# Patient Record
Sex: Female | Born: 1935
Health system: Southern US, Community
[De-identification: ages and names within clinical notes are randomized; demographics above are authoritative.]

## PROBLEM LIST (undated history)

## (undated) DIAGNOSIS — G2582 Stiff-man syndrome: Secondary | ICD-10-CM

## (undated) DIAGNOSIS — C50919 Malignant neoplasm of unspecified site of unspecified female breast: Secondary | ICD-10-CM

## (undated) DIAGNOSIS — R05 Cough: Secondary | ICD-10-CM

## (undated) DIAGNOSIS — I10 Essential (primary) hypertension: Secondary | ICD-10-CM

## (undated) DIAGNOSIS — I480 Paroxysmal atrial fibrillation: Secondary | ICD-10-CM

## (undated) DIAGNOSIS — E785 Hyperlipidemia, unspecified: Secondary | ICD-10-CM

## (undated) DIAGNOSIS — Z923 Personal history of irradiation: Secondary | ICD-10-CM

## (undated) DIAGNOSIS — T464X5A Adverse effect of angiotensin-converting-enzyme inhibitors, initial encounter: Secondary | ICD-10-CM

## (undated) DIAGNOSIS — I2510 Atherosclerotic heart disease of native coronary artery without angina pectoris: Principal | ICD-10-CM

## (undated) DIAGNOSIS — M8448XA Pathological fracture, other site, initial encounter for fracture: Secondary | ICD-10-CM

## (undated) DIAGNOSIS — R760 Raised antibody titer: Secondary | ICD-10-CM

## (undated) DIAGNOSIS — E079 Disorder of thyroid, unspecified: Secondary | ICD-10-CM

## (undated) DIAGNOSIS — I5032 Chronic diastolic (congestive) heart failure: Secondary | ICD-10-CM

## (undated) DIAGNOSIS — R058 Other specified cough: Secondary | ICD-10-CM

## (undated) HISTORY — DX: Malignant neoplasm of unspecified site of unspecified female breast: C50.919

## (undated) HISTORY — DX: Cough: R05

## (undated) HISTORY — PX: CARPAL TUNNEL RELEASE: SHX101

## (undated) HISTORY — PX: OTHER SURGICAL HISTORY: SHX169

## (undated) HISTORY — DX: Essential (primary) hypertension: I10

## (undated) HISTORY — DX: Hyperlipidemia, unspecified: E78.5

## (undated) HISTORY — PX: TOTAL ABDOMINAL HYSTERECTOMY: SHX209

## (undated) HISTORY — PX: BREAST BIOPSY: SHX20

## (undated) HISTORY — DX: Adverse effect of angiotensin-converting-enzyme inhibitors, initial encounter: T46.4X5A

## (undated) HISTORY — DX: Disorder of thyroid, unspecified: E07.9

## (undated) HISTORY — DX: Morbid (severe) obesity due to excess calories: E66.01

## (undated) HISTORY — PX: EYE SURGERY: SHX253

## (undated) HISTORY — DX: Other specified cough: R05.8

## (undated) HISTORY — DX: Atherosclerotic heart disease of native coronary artery without angina pectoris: I25.10

---

## 1997-05-24 ENCOUNTER — Other Ambulatory Visit: Admission: RE | Admit: 1997-05-24 | Discharge: 1997-05-24 | Payer: Self-pay | Admitting: *Deleted

## 1998-06-11 ENCOUNTER — Other Ambulatory Visit: Admission: RE | Admit: 1998-06-11 | Discharge: 1998-06-11 | Payer: Self-pay | Admitting: *Deleted

## 1998-07-13 DIAGNOSIS — I2510 Atherosclerotic heart disease of native coronary artery without angina pectoris: Secondary | ICD-10-CM

## 1998-07-13 HISTORY — DX: Atherosclerotic heart disease of native coronary artery without angina pectoris: I25.10

## 1998-07-30 ENCOUNTER — Ambulatory Visit (HOSPITAL_COMMUNITY): Admission: RE | Admit: 1998-07-30 | Discharge: 1998-07-30 | Payer: Self-pay | Admitting: Cardiology

## 1998-08-20 ENCOUNTER — Encounter (HOSPITAL_COMMUNITY): Admission: RE | Admit: 1998-08-20 | Discharge: 1998-11-18 | Payer: Self-pay | Admitting: Cardiology

## 1998-11-12 ENCOUNTER — Encounter: Admission: RE | Admit: 1998-11-12 | Discharge: 1998-11-12 | Payer: Self-pay | Admitting: Family Medicine

## 1998-11-12 ENCOUNTER — Encounter: Payer: Self-pay | Admitting: Family Medicine

## 1998-12-02 ENCOUNTER — Encounter (HOSPITAL_COMMUNITY): Admission: RE | Admit: 1998-12-02 | Discharge: 1999-03-02 | Payer: Self-pay | Admitting: Cardiology

## 1999-03-03 ENCOUNTER — Encounter (HOSPITAL_COMMUNITY): Admission: RE | Admit: 1999-03-03 | Discharge: 1999-06-01 | Payer: Self-pay | Admitting: Cardiology

## 1999-03-12 ENCOUNTER — Encounter: Payer: Self-pay | Admitting: Orthopaedic Surgery

## 1999-03-12 ENCOUNTER — Encounter: Admission: RE | Admit: 1999-03-12 | Discharge: 1999-03-12 | Payer: Self-pay | Admitting: Orthopaedic Surgery

## 1999-06-02 ENCOUNTER — Encounter (HOSPITAL_COMMUNITY): Admission: RE | Admit: 1999-06-02 | Discharge: 1999-08-31 | Payer: Self-pay | Admitting: Cardiology

## 1999-07-08 ENCOUNTER — Other Ambulatory Visit: Admission: RE | Admit: 1999-07-08 | Discharge: 1999-07-08 | Payer: Self-pay | Admitting: *Deleted

## 1999-09-01 ENCOUNTER — Encounter (HOSPITAL_COMMUNITY): Admission: RE | Admit: 1999-09-01 | Discharge: 1999-11-30 | Payer: Self-pay | Admitting: Cardiology

## 1999-12-01 ENCOUNTER — Encounter (HOSPITAL_COMMUNITY): Admission: RE | Admit: 1999-12-01 | Discharge: 2000-02-29 | Payer: Self-pay | Admitting: Cardiology

## 2000-03-03 ENCOUNTER — Encounter: Admission: RE | Admit: 2000-03-03 | Discharge: 2000-03-03 | Payer: Self-pay | Admitting: Orthopaedic Surgery

## 2000-03-03 ENCOUNTER — Encounter: Payer: Self-pay | Admitting: Orthopaedic Surgery

## 2000-03-11 ENCOUNTER — Encounter: Admission: RE | Admit: 2000-03-11 | Discharge: 2000-04-23 | Payer: Self-pay | Admitting: Orthopaedic Surgery

## 2000-04-13 ENCOUNTER — Encounter (HOSPITAL_COMMUNITY): Admission: RE | Admit: 2000-04-13 | Discharge: 2000-07-12 | Payer: Self-pay | Admitting: Cardiology

## 2000-07-13 ENCOUNTER — Encounter (HOSPITAL_COMMUNITY): Admission: RE | Admit: 2000-07-13 | Discharge: 2000-10-11 | Payer: Self-pay | Admitting: Cardiology

## 2000-10-12 ENCOUNTER — Encounter (HOSPITAL_COMMUNITY): Admission: RE | Admit: 2000-10-12 | Discharge: 2001-01-10 | Payer: Self-pay | Admitting: Cardiology

## 2001-01-12 ENCOUNTER — Encounter (HOSPITAL_COMMUNITY): Admission: RE | Admit: 2001-01-12 | Discharge: 2001-04-12 | Payer: Self-pay | Admitting: Cardiology

## 2001-03-14 ENCOUNTER — Encounter: Payer: Self-pay | Admitting: Family Medicine

## 2001-03-14 ENCOUNTER — Encounter: Admission: RE | Admit: 2001-03-14 | Discharge: 2001-03-14 | Payer: Self-pay | Admitting: Family Medicine

## 2001-04-13 ENCOUNTER — Encounter: Admission: RE | Admit: 2001-04-13 | Discharge: 2001-07-12 | Payer: Self-pay | Admitting: Cardiology

## 2001-07-13 ENCOUNTER — Encounter (HOSPITAL_COMMUNITY): Admission: RE | Admit: 2001-07-13 | Discharge: 2001-09-19 | Payer: Self-pay | Admitting: Cardiology

## 2004-12-20 ENCOUNTER — Emergency Department (HOSPITAL_COMMUNITY): Admission: EM | Admit: 2004-12-20 | Discharge: 2004-12-20 | Payer: Self-pay | Admitting: Family Medicine

## 2005-03-26 ENCOUNTER — Encounter: Admission: RE | Admit: 2005-03-26 | Discharge: 2005-03-26 | Payer: Self-pay | Admitting: Radiology

## 2007-04-15 ENCOUNTER — Emergency Department (HOSPITAL_COMMUNITY): Admission: EM | Admit: 2007-04-15 | Discharge: 2007-04-16 | Payer: Self-pay | Admitting: Emergency Medicine

## 2007-09-12 ENCOUNTER — Encounter: Admission: RE | Admit: 2007-09-12 | Discharge: 2007-09-12 | Payer: Self-pay | Admitting: Family Medicine

## 2008-01-13 DIAGNOSIS — C50919 Malignant neoplasm of unspecified site of unspecified female breast: Secondary | ICD-10-CM

## 2008-01-13 DIAGNOSIS — Z923 Personal history of irradiation: Secondary | ICD-10-CM

## 2008-01-13 HISTORY — DX: Personal history of irradiation: Z92.3

## 2008-01-13 HISTORY — DX: Malignant neoplasm of unspecified site of unspecified female breast: C50.919

## 2008-03-01 ENCOUNTER — Encounter: Admission: RE | Admit: 2008-03-01 | Discharge: 2008-03-01 | Payer: Self-pay | Admitting: Family Medicine

## 2008-03-12 HISTORY — PX: BREAST LUMPECTOMY: SHX2

## 2008-03-19 ENCOUNTER — Encounter: Admission: RE | Admit: 2008-03-19 | Discharge: 2008-03-19 | Payer: Self-pay | Admitting: Family Medicine

## 2008-03-26 ENCOUNTER — Encounter (INDEPENDENT_AMBULATORY_CARE_PROVIDER_SITE_OTHER): Payer: Self-pay | Admitting: Diagnostic Radiology

## 2008-03-26 ENCOUNTER — Encounter: Admission: RE | Admit: 2008-03-26 | Discharge: 2008-03-26 | Payer: Self-pay | Admitting: Family Medicine

## 2008-03-29 ENCOUNTER — Ambulatory Visit: Payer: Self-pay | Admitting: Oncology

## 2008-04-20 ENCOUNTER — Encounter: Admission: RE | Admit: 2008-04-20 | Discharge: 2008-04-20 | Payer: Self-pay | Admitting: General Surgery

## 2008-04-24 ENCOUNTER — Ambulatory Visit (HOSPITAL_BASED_OUTPATIENT_CLINIC_OR_DEPARTMENT_OTHER): Admission: RE | Admit: 2008-04-24 | Discharge: 2008-04-24 | Payer: Self-pay | Admitting: General Surgery

## 2008-04-24 ENCOUNTER — Encounter: Admission: RE | Admit: 2008-04-24 | Discharge: 2008-04-24 | Payer: Self-pay | Admitting: General Surgery

## 2008-04-24 ENCOUNTER — Encounter (INDEPENDENT_AMBULATORY_CARE_PROVIDER_SITE_OTHER): Payer: Self-pay | Admitting: General Surgery

## 2008-05-22 ENCOUNTER — Ambulatory Visit: Admission: RE | Admit: 2008-05-22 | Discharge: 2008-07-08 | Payer: Self-pay | Admitting: Radiation Oncology

## 2008-05-23 ENCOUNTER — Ambulatory Visit: Payer: Self-pay | Admitting: Oncology

## 2008-05-23 LAB — CBC WITH DIFFERENTIAL/PLATELET
EOS%: 2.1 % (ref 0.0–7.0)
MCHC: 34 g/dL (ref 31.5–36.0)
MCV: 92.7 fL (ref 79.5–101.0)
Platelets: 248 10*3/uL (ref 145–400)
RBC: 4.55 10*6/uL (ref 3.70–5.45)
WBC: 6.2 10*3/uL (ref 3.9–10.3)

## 2008-05-23 LAB — COMPREHENSIVE METABOLIC PANEL
AST: 16 U/L (ref 0–37)
Albumin: 4.3 g/dL (ref 3.5–5.2)
BUN: 14 mg/dL (ref 6–23)
CO2: 27 mEq/L (ref 19–32)
Calcium: 8.9 mg/dL (ref 8.4–10.5)
Chloride: 104 mEq/L (ref 96–112)
Creatinine, Ser: 0.8 mg/dL (ref 0.40–1.20)
Glucose, Bld: 95 mg/dL (ref 70–99)
Potassium: 4.6 mEq/L (ref 3.5–5.3)

## 2008-09-21 ENCOUNTER — Ambulatory Visit: Payer: Self-pay | Admitting: Oncology

## 2008-10-16 ENCOUNTER — Encounter: Admission: RE | Admit: 2008-10-16 | Discharge: 2009-01-02 | Payer: Self-pay | Admitting: Oncology

## 2009-01-08 ENCOUNTER — Encounter: Admission: RE | Admit: 2009-01-08 | Discharge: 2009-01-08 | Payer: Self-pay | Admitting: General Surgery

## 2009-02-22 ENCOUNTER — Ambulatory Visit: Payer: Self-pay | Admitting: Oncology

## 2009-08-30 ENCOUNTER — Ambulatory Visit: Payer: Self-pay | Admitting: Cardiology

## 2009-09-17 ENCOUNTER — Ambulatory Visit: Payer: Self-pay | Admitting: Cardiology

## 2009-09-24 ENCOUNTER — Ambulatory Visit: Payer: Self-pay | Admitting: Cardiology

## 2009-10-25 ENCOUNTER — Encounter: Admission: RE | Admit: 2009-10-25 | Discharge: 2009-10-25 | Payer: Self-pay | Admitting: Orthopedic Surgery

## 2009-10-29 ENCOUNTER — Ambulatory Visit (HOSPITAL_BASED_OUTPATIENT_CLINIC_OR_DEPARTMENT_OTHER): Admission: RE | Admit: 2009-10-29 | Discharge: 2009-10-29 | Payer: Self-pay | Admitting: Orthopedic Surgery

## 2009-11-12 ENCOUNTER — Ambulatory Visit (HOSPITAL_BASED_OUTPATIENT_CLINIC_OR_DEPARTMENT_OTHER): Admission: RE | Admit: 2009-11-12 | Discharge: 2009-11-13 | Payer: Self-pay | Admitting: Orthopedic Surgery

## 2010-02-02 ENCOUNTER — Encounter: Payer: Self-pay | Admitting: Family Medicine

## 2010-02-05 ENCOUNTER — Encounter
Admission: RE | Admit: 2010-02-05 | Discharge: 2010-02-05 | Payer: Self-pay | Source: Home / Self Care | Attending: General Surgery | Admitting: General Surgery

## 2010-02-07 ENCOUNTER — Encounter
Admission: RE | Admit: 2010-02-07 | Discharge: 2010-02-07 | Payer: Self-pay | Source: Home / Self Care | Attending: General Surgery | Admitting: General Surgery

## 2010-02-11 ENCOUNTER — Encounter
Admission: RE | Admit: 2010-02-11 | Discharge: 2010-02-11 | Payer: Self-pay | Source: Home / Self Care | Attending: General Surgery | Admitting: General Surgery

## 2010-02-11 ENCOUNTER — Other Ambulatory Visit: Payer: Self-pay | Admitting: General Surgery

## 2010-02-11 DIAGNOSIS — Z9889 Other specified postprocedural states: Secondary | ICD-10-CM

## 2010-02-11 DIAGNOSIS — N63 Unspecified lump in unspecified breast: Secondary | ICD-10-CM

## 2010-02-15 ENCOUNTER — Encounter: Payer: Self-pay | Admitting: General Surgery

## 2010-02-19 ENCOUNTER — Other Ambulatory Visit: Payer: Self-pay | Admitting: General Surgery

## 2010-02-19 ENCOUNTER — Inpatient Hospital Stay: Admission: RE | Admit: 2010-02-19 | Payer: Medicare Other | Source: Ambulatory Visit

## 2010-02-19 ENCOUNTER — Ambulatory Visit
Admission: RE | Admit: 2010-02-19 | Discharge: 2010-02-19 | Disposition: A | Discharge status: Home / Self Care | Payer: Medicare Other | Source: Ambulatory Visit | Attending: General Surgery | Admitting: General Surgery

## 2010-02-19 ENCOUNTER — Other Ambulatory Visit: Payer: Self-pay | Admitting: Diagnostic Radiology

## 2010-02-19 ENCOUNTER — Ambulatory Visit
Admission: RE | Admit: 2010-02-19 | Discharge: 2010-02-19 | Disposition: A | Discharge status: Home / Self Care | Payer: Medicare Other | Source: Ambulatory Visit

## 2010-02-19 DIAGNOSIS — N649 Disorder of breast, unspecified: Secondary | ICD-10-CM

## 2010-02-19 DIAGNOSIS — N63 Unspecified lump in unspecified breast: Secondary | ICD-10-CM

## 2010-02-19 DIAGNOSIS — Z9889 Other specified postprocedural states: Secondary | ICD-10-CM

## 2010-02-19 MED ORDER — GADOBENATE DIMEGLUMINE 529 MG/ML IV SOLN
15.0000 mL | Freq: Once | INTRAVENOUS | Status: AC | PRN
  Administered 2010-02-19: 15 mL via INTRAVENOUS

## 2010-02-21 ENCOUNTER — Other Ambulatory Visit: Payer: Medicare Other

## 2010-03-26 LAB — POCT HEMOGLOBIN-HEMACUE: Hemoglobin: 14.4 g/dL (ref 12.0–15.0)

## 2010-03-27 LAB — BASIC METABOLIC PANEL
CO2: 32 mEq/L (ref 19–32)
Calcium: 9.4 mg/dL (ref 8.4–10.5)
GFR calc Af Amer: >60 mL/min (ref >60)
GFR calc non Af Amer: >60 mL/min (ref >60)
Sodium: 131 mEq/L — ABNORMAL LOW (ref 135–145)

## 2010-03-28 ENCOUNTER — Ambulatory Visit (INDEPENDENT_AMBULATORY_CARE_PROVIDER_SITE_OTHER): Payer: Medicare Other | Admitting: Nurse Practitioner

## 2010-03-28 ENCOUNTER — Encounter: Payer: Self-pay | Admitting: *Deleted

## 2010-03-28 DIAGNOSIS — T464X5A Adverse effect of angiotensin-converting-enzyme inhibitors, initial encounter: Secondary | ICD-10-CM | POA: Insufficient documentation

## 2010-03-28 DIAGNOSIS — I2510 Atherosclerotic heart disease of native coronary artery without angina pectoris: Secondary | ICD-10-CM

## 2010-03-28 DIAGNOSIS — R05 Cough: Secondary | ICD-10-CM

## 2010-03-28 DIAGNOSIS — I10 Essential (primary) hypertension: Secondary | ICD-10-CM | POA: Insufficient documentation

## 2010-03-28 DIAGNOSIS — E785 Hyperlipidemia, unspecified: Secondary | ICD-10-CM | POA: Insufficient documentation

## 2010-03-28 NOTE — Patient Instructions (Signed)
We will stop the Lisinopril today and try Benicar.  I have given you samples of the 40mg  tablets. I would like for you to just take 1/2 tablet each day. Continue to monitor your blood pressure. Diet, weight loss and activity are encouraged. I will see you back in 4 weeks.

## 2010-03-28 NOTE — Progress Notes (Signed)
Subjective: Carmen Haney is seen today for her follow up visit. She is seen for Dr. Deborah Chalk. Overall she is doing well and has no complaints. She has chronic DOE. She remains obese. Blood pressure at home has been good. Her only complaint is a cough that is dry and hacky in nature. She denies chest pain. She admits that she is not active.    Current Outpatient Prescriptions  Medication Sig Dispense Refill  . alendronate (FOSAMAX) 70 MG tablet Take 70 mg by mouth every 7 (seven) days. Take with a full glass of water on an empty stomach.       Marland Kitchen anastrozole (ARIMIDEX) 1 MG tablet Take 1 mg by mouth daily.        Marland Kitchen aspirin 81 MG tablet Take 81 mg by mouth daily.        Marland Kitchen atenolol (TENORMIN) 25 MG tablet Take 25 mg by mouth daily.        . hydrochlorothiazide 25 MG tablet Take 25 mg by mouth daily.        Marland Kitchen levothyroxine (SYNTHROID, LEVOTHROID) 75 MCG tablet Take 75 mcg by mouth daily.        Marland Kitchen lisinopril (PRINIVIL,ZESTRIL) 10 MG tablet Take 10 mg by mouth daily.        . simvastatin (ZOCOR) 20 MG tablet Take 20 mg by mouth at bedtime.        Marland Kitchen VITAMIN D, CHOLECALCIFEROL, PO Take 400 Int'l Units by mouth 1 dose over 46 hours.          Allergies  Allergen Reactions  . Latex     There is no problem list on file for this patient.   History  Smoking status  . Former Smoker  . Types: Cigarettes  Smokeless tobacco  . Not on file  Comment: stopped in 1970's    History  Alcohol Use No    Family History  Problem Relation Age of Onset  . Coronary artery disease Mother   . Hypertension Mother   . Breast cancer Mother   . Melanoma Mother   . Prostate cancer Father 71    Review of Systems: ROS is positive for DOE. She has not been lightheaded or dizzy. She denies chest pain. She tolerates her medicines. All other review of systems are negative.  Physical Exam: She is a pleasant obese female. She is in no acute distress. VS are noted. Skin is warm and dry. Color is normal. HEENT is  unremarkable. Lungs are clear. Heart tones are distant. Abdomen is obese, but soft. She has no edema. Gait and ROM are intact. She has no gross neurologic deficits.   Assessment / Plan:

## 2010-03-28 NOTE — Assessment & Plan Note (Signed)
On Zocor. Labs are checked today.

## 2010-03-28 NOTE — Assessment & Plan Note (Signed)
No chest pain. Has multiple CV risk factors. Need to consider repeat stress test on return. Last study dates back to 2009.

## 2010-03-28 NOTE — Assessment & Plan Note (Signed)
She reports good BP control at home. She will continue to monitor. Advised lifestyle changes once again.

## 2010-03-28 NOTE — Assessment & Plan Note (Signed)
Encouraged weight loss. Not motivated to make changes at this time.

## 2010-03-31 ENCOUNTER — Encounter (HOSPITAL_BASED_OUTPATIENT_CLINIC_OR_DEPARTMENT_OTHER): Payer: Medicare Other | Admitting: Oncology

## 2010-03-31 DIAGNOSIS — M899 Disorder of bone, unspecified: Secondary | ICD-10-CM

## 2010-03-31 DIAGNOSIS — M949 Disorder of cartilage, unspecified: Secondary | ICD-10-CM

## 2010-03-31 DIAGNOSIS — D0590 Unspecified type of carcinoma in situ of unspecified breast: Secondary | ICD-10-CM

## 2010-03-31 DIAGNOSIS — M810 Age-related osteoporosis without current pathological fracture: Secondary | ICD-10-CM

## 2010-04-21 ENCOUNTER — Encounter: Payer: Self-pay | Admitting: Nurse Practitioner

## 2010-04-23 LAB — BASIC METABOLIC PANEL
BUN: 13 mg/dL (ref 6–23)
GFR calc non Af Amer: >60 mL/min (ref >60)
Glucose, Bld: 93 mg/dL (ref 70–99)
Potassium: 4.5 mEq/L (ref 3.5–5.1)

## 2010-04-25 ENCOUNTER — Encounter: Payer: Self-pay | Admitting: Nurse Practitioner

## 2010-04-25 ENCOUNTER — Ambulatory Visit (INDEPENDENT_AMBULATORY_CARE_PROVIDER_SITE_OTHER): Payer: Medicare Other | Admitting: Nurse Practitioner

## 2010-04-25 ENCOUNTER — Ambulatory Visit: Payer: Medicare Other | Admitting: Nurse Practitioner

## 2010-04-25 VITALS — BP 130/80 | HR 56 | Wt 200.2 lb

## 2010-04-25 DIAGNOSIS — I2510 Atherosclerotic heart disease of native coronary artery without angina pectoris: Secondary | ICD-10-CM

## 2010-04-25 DIAGNOSIS — T464X5A Adverse effect of angiotensin-converting-enzyme inhibitors, initial encounter: Secondary | ICD-10-CM

## 2010-04-25 DIAGNOSIS — T44905A Adverse effect of unspecified drugs primarily affecting the autonomic nervous system, initial encounter: Secondary | ICD-10-CM

## 2010-04-25 DIAGNOSIS — R05 Cough: Secondary | ICD-10-CM

## 2010-04-25 DIAGNOSIS — I10 Essential (primary) hypertension: Secondary | ICD-10-CM

## 2010-04-25 DIAGNOSIS — R059 Cough, unspecified: Secondary | ICD-10-CM

## 2010-04-25 LAB — BASIC METABOLIC PANEL
BUN: 18 mg/dL (ref 6–23)
CO2: 30 mEq/L (ref 19–32)
Calcium: 9.4 mg/dL (ref 8.4–10.5)
Chloride: 95 mEq/L — ABNORMAL LOW (ref 96–112)
Creatinine, Ser: 0.9 mg/dL (ref 0.4–1.2)
GFR: 62.78 mL/min (ref >60.00)
Glucose, Bld: 98 mg/dL (ref 70–99)
Potassium: 4.5 mEq/L (ref 3.5–5.1)
Sodium: 134 mEq/L — ABNORMAL LOW (ref 135–145)

## 2010-04-25 MED ORDER — LOSARTAN POTASSIUM 50 MG PO TABS: 50.0000 mg | ORAL_TABLET | Freq: Every day | ORAL | Status: DC

## 2010-04-25 NOTE — Progress Notes (Signed)
Carmen Haney Date of Birth: Jan 21, 1930   History of Present Illness: Carmen Haney is seen today for her 4 week check. She is seen for Dr. Deborah Chalk. She is doing well. She denies chest pain. She does get short of breath if she over exerts but notes that she rarely overexerts. She does not exercise and is not motivated to make changes. She has had some cramps. She is worried about her sodium level being too low. We will recheck that today. She has not had a stress test in 3 years. She does have left main CAD. She is tolerating her medicines. She had an ACE cough and is now on Benicar. Her blood pressure at home was ok but her cuff has stopped working. She needs to switch to the generic ARB.   Current Outpatient Prescriptions on File Prior to Visit  Medication Sig Dispense Refill  . alendronate (FOSAMAX) 70 MG tablet Take 70 mg by mouth every 7 (seven) days. Take with a full glass of water on an empty stomach.       Marland Kitchen anastrozole (ARIMIDEX) 1 MG tablet Take 1 mg by mouth daily.        Marland Kitchen aspirin 81 MG tablet Take 81 mg by mouth daily.        Marland Kitchen atenolol (TENORMIN) 25 MG tablet Take 25 mg by mouth daily.        . Calcium Carbonate-Vitamin D (CALCIUM + D PO) Take by mouth 2 (two) times daily.        . hydrochlorothiazide 25 MG tablet Take 25 mg by mouth daily.        Marland Kitchen levothyroxine (SYNTHROID, LEVOTHROID) 75 MCG tablet Take 75 mcg by mouth daily.        Marland Kitchen losartan (COZAAR) 50 MG tablet Take 1 tablet (50 mg total) by mouth daily.  90 tablet  3  . simvastatin (ZOCOR) 20 MG tablet Take 10 mg by mouth at bedtime.       Marland Kitchen VITAMIN D, CHOLECALCIFEROL, PO Take 400 Int'l Units by mouth 1 dose over 46 hours.        Marland Kitchen DISCONTD: olmesartan (BENICAR) 20 MG tablet Take 20 mg by mouth daily.        Marland Kitchen DISCONTD: lisinopril (PRINIVIL,ZESTRIL) 10 MG tablet Take 10 mg by mouth daily.          Allergies  Allergen Reactions  . Latex     Past Medical History  Diagnosis Date  . CAD (coronary artery disease) 07/1998    s/p  cath in 2000. L main with 30 to 40%  . Obesities, morbid   . HTN (hypertension)   . Breast ca     s/p lumpectomy & radiation  . Hyperlipidemia   . ACE-inhibitor cough     Past Surgical History  Procedure Date  . Breast lumpectomy March 2010  . Cesarean section   . Total abdominal hysterectomy     History  Smoking status  . Former Smoker  . Types: Cigarettes  . Quit date: 01/13/1963  Smokeless tobacco  . Never Used  Comment: stopped in 1970's    History  Alcohol Use No    Family History  Problem Relation Age of Onset  . Coronary artery disease Mother   . Hypertension Mother   . Breast cancer Mother   . Melanoma Mother   . Prostate cancer Father 7    Review of Systems: The review of systems is positive for cramps. She is a poor sleeper chronically. She  has DOE. No chest pain.  All other systems were reviewed and are negative.  Physical Exam: BP 130/80  Pulse 56  Wt 200 lb 3.2 oz (90.81 kg) Patient is very pleasant and in no acute distress. She is obese. Skin is warm and dry. Color is normal.  HEENT is unremarkable. Normocephalic/atraumatic. PERRL. Sclera are nonicteric. Neck is supple. No masses. No JVD. Lungs are clear. Cardiac exam shows a regular rate and rhythm. Abdomen is soft. Extremities are without edema. Gait and ROM are intact. No gross neurologic deficits noted.  LABORATORY DATA:  BMET is pending   Assessment / Plan:

## 2010-04-25 NOTE — Assessment & Plan Note (Signed)
Resolved with switching to ARB.

## 2010-04-25 NOTE — Assessment & Plan Note (Signed)
Blood pressure looks better. I have switched her to generic losartan 50mg . We will check a BMET today. I encouraged her to get a new cuff and monitor her blood pressure. I will have her see Dr. Jens Som in 4 months for follow up due to Dr. Ronnald Nian retirement.

## 2010-04-25 NOTE — Patient Instructions (Addendum)
Continue with your current medicines. Psychiatrist. A prescription for Losartan has been sent to the drug store.  Monitor your blood pressure at home.  Record your readings and bring to your next visit. Limit sodium intake. Call for any problems. We are going to arrange for a stress test.  I will have you see Dr. Jens Som in 4 months

## 2010-04-25 NOTE — Assessment & Plan Note (Signed)
She is not motivated to make changes at this time.

## 2010-04-25 NOTE — Assessment & Plan Note (Signed)
She has known left main disease. She is not having chest pain. We will update her stress test.

## 2010-05-01 ENCOUNTER — Telehealth: Payer: Self-pay | Admitting: *Deleted

## 2010-05-01 NOTE — Telephone Encounter (Signed)
Message copied by Barnetta Hammersmith on Thu May 01, 2010  9:06 AM ------      Message from: Norma Fredrickson      Created: Fri Apr 25, 2010  3:32 PM       Ok to report. Labs are satisfactory. No need to change medicines.

## 2010-05-01 NOTE — Telephone Encounter (Signed)
Left message with lab results and to continue same medications.

## 2010-05-06 ENCOUNTER — Ambulatory Visit (HOSPITAL_COMMUNITY): Payer: Medicare Other | Attending: Cardiology | Admitting: Radiology

## 2010-05-06 ENCOUNTER — Encounter: Payer: Self-pay | Admitting: Cardiology

## 2010-05-06 VITALS — Ht 63.5 in | Wt 198.0 lb

## 2010-05-06 DIAGNOSIS — R0602 Shortness of breath: Secondary | ICD-10-CM

## 2010-05-06 DIAGNOSIS — R0989 Other specified symptoms and signs involving the circulatory and respiratory systems: Secondary | ICD-10-CM

## 2010-05-06 DIAGNOSIS — I4949 Other premature depolarization: Secondary | ICD-10-CM

## 2010-05-06 DIAGNOSIS — I2510 Atherosclerotic heart disease of native coronary artery without angina pectoris: Secondary | ICD-10-CM

## 2010-05-06 DIAGNOSIS — R0609 Other forms of dyspnea: Secondary | ICD-10-CM

## 2010-05-06 MED ORDER — TECHNETIUM TC 99M TETROFOSMIN IV KIT
11.0000 | PACK | Freq: Once | INTRAVENOUS | Status: AC | PRN
  Administered 2010-05-06: 11 via INTRAVENOUS

## 2010-05-06 MED ORDER — TECHNETIUM TC 99M TETROFOSMIN IV KIT
33.0000 | PACK | Freq: Once | INTRAVENOUS | Status: AC | PRN
  Administered 2010-05-06: 33 via INTRAVENOUS

## 2010-05-06 MED ORDER — REGADENOSON 0.4 MG/5ML IV SOLN
0.4000 mg | Freq: Once | INTRAVENOUS | Status: AC
  Administered 2010-05-06: 0.4 mg via INTRAVENOUS

## 2010-05-06 NOTE — Progress Notes (Signed)
[facility] SITE 3 NUCLEAR MED [address] [place] [address] [phone]  Cardiology Nuclear Med Study  Carmen Haney is a 77 y.o. female [id number] 06-10-1930   Nuclear Med Background Indication for Stress Test:  Evaluation for Ischemia History: '02 Heart Catheterization: (L) Main 30-40% and 02/09 Myocardial Perfusion Study NL EF 79% Cardiac Risk Factors: Family History - CAD, History of Smoking, Hypertension, Lipids and Obesity  Symptoms:  DOE, Fatigue, Palpitations and SOB   Nuclear Pre-Procedure Caffeine/Decaff Intake:  None NPO After: 10:00pm   Lungs:  clear IV 0.9% NS with Angio Cath:  22g  IV Site: L Antecubital  IV Started by:  Irean Hong, RN  Chest Size (in):  40 Cup Size: C  Height: 5' 3.5" (1.613 m)  Weight:  198 lb (89.812 kg)  BMI:  Body mass index is 34.52 kg/(m^2). Tech Comments:  Held atenolol this am    Nuclear Med Study 1 or 2 day study: 1 day  Stress Test Type:  Eugenie Birks  Reading MD: Arvilla Meres, MD  Order Authorizing Provider:  S.Tennant  Resting Radionuclide: Technetium 62m Tetrofosmin  Resting Radionuclide Dose: 11 mCi   Stress Radionuclide:  Technetium 71m Tetrofosmin  Stress Radionuclide Dose: 33 mCi           Stress Protocol Rest HR: 57 Stress HR: 96  Rest BP: 153/66 Stress BP: 114/56  Exercise Time (min): n/a METS: n/a   Predicted Max HR: 143 bpm % Max HR: 67.13 bpm Rate Pressure Product: 21308   Dose of Adenosine (mg):  n/a Dose of Lexiscan: 0.4 mg  Dose of Atropine (mg): n/a Dose of Dobutamine: n/a mcg/kg/min (at max HR)  Stress Test Technologist: Milana Na, EMT-P  Nuclear Technologist:  Domenic Polite, CNMT     Rest Procedure:  Myocardial perfusion imaging was performed at rest 45 minutes following the intravenous administration of Technetium 34m Tetrofosmin. Rest ECG: Sinus Bradycardia with PVCS  Stress Procedure:  The patient received IV Lexiscan 0.4 mg over 15-seconds.  Technetium 35m  Tetrofosmin injected at 30-seconds.  There were no significant changes and occ pvcs with Lexiscan.  Quantitative spect images were obtained after a 45 minute delay. Stress ECG: No significant change from baseline ECG  QPS Raw Data Images:  There is a breast shadow that accounts for the anterior attenuation. Stress Images:  There is decreased uptake in the anterior wall. Rest Images:  There is decreased uptake in the anterior wall. Subtraction (SDS):  There is a fixed anteriour defect that is most consistent with breast attenuation. Transient Ischemic Dilatation (Normal <1.22): .85  Lung/Heart Ratio (Normal <0.45):  .32   Quantitative Gated Spect Images QGS EDV:  57 ml QGS ESV:  9 ml QGS cine images:  NL LV Function; NL Wall Motion QGS EF: 84%  Impression Exercise Capacity:  Lexiscan with no exercise. BP Response:  n/a Clinical Symptoms:  n/a ECG Impression:  No significant ECG changes with Lexiscan. Comparison with Prior Nuclear Study: No images to compare  Overall Impression:  Normal stress nuclear study. There is a fixed defect in the distal anterior wall and apex which is likely breast attenuation.   [name]

## 2010-05-07 NOTE — Progress Notes (Signed)
Copy routed to Dr. Tennant.Falecha Clark ° °

## 2010-05-08 NOTE — Progress Notes (Signed)
normal

## 2010-05-09 ENCOUNTER — Telehealth: Payer: Self-pay | Admitting: *Deleted

## 2010-05-09 NOTE — Progress Notes (Signed)
Pt notified of nuclear results.  RN will mail pt copy of nuclear and recent lab work per pt request.

## 2010-05-09 NOTE — Telephone Encounter (Signed)
Pt notified of nuclear results.  RN will mail copy of nuclear and recent lab work to pt, per pt request.

## 2010-05-12 ENCOUNTER — Encounter (INDEPENDENT_AMBULATORY_CARE_PROVIDER_SITE_OTHER): Payer: Self-pay | Admitting: General Surgery

## 2010-05-21 ENCOUNTER — Encounter: Payer: Self-pay | Admitting: Cardiology

## 2010-05-27 NOTE — Op Note (Signed)
Carmen Haney, Carmen Haney                ACCOUNT NO.:  [id number]   MEDICAL RECORD NO.:  [id number]          PATIENT TYPE:  AMB   LOCATION:  DSC                          FACILITY:  [facility]   PHYSICIAN:  Ollen Gross. Vernell Morgans, M.D. DATE OF BIRTH:  January 25, 1930   DATE OF PROCEDURE:  04/24/2008  DATE OF DISCHARGE:                               OPERATIVE REPORT   PREOPERATIVE DIAGNOSIS:  Right breast ductal carcinoma in situ in the  upper inner quadrant.   POSTOPERATIVE DIAGNOSIS:  Right breast ductal carcinoma in situ in the  upper inner quadrant.   PROCEDURE:  Right breast needle-localized lumpectomy.   SURGEON:  Ollen Gross. Vernell Morgans, MD   ANESTHESIA:  General vial LMA.   PROCEDURE:  After informed consent was obtained, the patient was brought  to the operating room, placed in the supine position on the operating  room table.  After adequate induction of general anesthesia, the  patient's right breast was prepped with Betadine and draped in the usual  sterile manner.  Earlier in the day, the patient had undergone a wire-  localization procedure and the wire was entered in the right breast in  the upper inner quadrant medially and then laterally.  A curvilinear  transverse incision was made in the upper inner quadrant overlying the  mass.  This was done with a #15 blade knife.  This incision was carried  down through the skin and subcutaneous tissue sharply with the  electrocautery.  Once into the subcutaneous tissue and breast tissue,  the path of the wire was able to be palpated, a circular portion of  breast tissue around the path of wire was excised sharply with the  electrocautery.  Once this was accomplished, specimen was removed from  the patient and oriented according to the signed colors of the paint  kit.  It was then sent for specimen radiograph, which showed the clip to  be pretty much in the center of the specimen.  It was then sent to  Pathology for further evaluation.  Hemostasis was  achieved using the  Bovie electrocautery.  The wound was irrigated with copious amounts of  saline.  The wound was infiltrated with 0.25% Marcaine.  The deep layer  of the wound was closed with interrupted 3-0 Vicryl stitches and the  skin was closed with a running 4-0 Monocryl subcuticular stitch.  Dermabond dressing was applied.  The patient tolerated the procedure  well.  At the end of the case, all needle, sponge, and instrument counts  were correct.  The patient was then awakened and taken to the recovery  room in stable condition.      Ollen Gross. Vernell Morgans, M.D.  Electronically Signed     PST/MEDQ  D:  04/24/2008  T:  04/25/2008  Job:  [id number]

## 2010-06-05 ENCOUNTER — Other Ambulatory Visit: Payer: Self-pay | Admitting: *Deleted

## 2010-06-05 MED ORDER — FUROSEMIDE 20 MG PO TABS: 20.0000 mg | ORAL_TABLET | Freq: Every day | ORAL | Status: DC

## 2010-06-05 NOTE — Telephone Encounter (Signed)
Lasix 20 mg daily refilled.  

## 2010-06-16 ENCOUNTER — Other Ambulatory Visit: Payer: Self-pay | Admitting: *Deleted

## 2010-06-16 DIAGNOSIS — Z79899 Other long term (current) drug therapy: Secondary | ICD-10-CM

## 2010-06-17 ENCOUNTER — Other Ambulatory Visit: Payer: Medicare Other | Admitting: *Deleted

## 2010-06-19 ENCOUNTER — Other Ambulatory Visit (INDEPENDENT_AMBULATORY_CARE_PROVIDER_SITE_OTHER): Payer: Medicare Other | Admitting: *Deleted

## 2010-06-19 DIAGNOSIS — Z79899 Other long term (current) drug therapy: Secondary | ICD-10-CM

## 2010-06-19 LAB — BASIC METABOLIC PANEL
BUN: 18 mg/dL (ref 6–23)
CO2: 30 mEq/L (ref 19–32)
Calcium: 8.9 mg/dL (ref 8.4–10.5)
Chloride: 97 mEq/L (ref 96–112)
Creatinine, Ser: 1 mg/dL (ref 0.4–1.2)
GFR: 57 mL/min — ABNORMAL LOW (ref >60.00)
Glucose, Bld: 128 mg/dL — ABNORMAL HIGH (ref 70–99)
Potassium: 4.4 mEq/L (ref 3.5–5.1)
Sodium: 140 mEq/L (ref 135–145)

## 2010-06-23 NOTE — Progress Notes (Signed)
Patient called with lab results. Pt verbalized understanding. Copy to pcp and one mailed to pt, Alfonso Ramus RN

## 2010-07-01 ENCOUNTER — Other Ambulatory Visit: Payer: Self-pay | Admitting: Dermatology

## 2010-07-08 ENCOUNTER — Other Ambulatory Visit: Payer: Self-pay | Admitting: Gastroenterology

## 2010-08-15 ENCOUNTER — Ambulatory Visit: Payer: Medicare Other | Admitting: Cardiology

## 2010-08-29 ENCOUNTER — Encounter: Payer: Self-pay | Admitting: Cardiology

## 2010-09-02 ENCOUNTER — Other Ambulatory Visit: Payer: Self-pay | Admitting: Cardiology

## 2010-09-02 DIAGNOSIS — I119 Hypertensive heart disease without heart failure: Secondary | ICD-10-CM

## 2010-09-23 ENCOUNTER — Encounter: Payer: Self-pay | Admitting: Cardiology

## 2010-09-23 ENCOUNTER — Ambulatory Visit (INDEPENDENT_AMBULATORY_CARE_PROVIDER_SITE_OTHER): Payer: Medicare Other | Admitting: Cardiology

## 2010-09-23 DIAGNOSIS — I2510 Atherosclerotic heart disease of native coronary artery without angina pectoris: Secondary | ICD-10-CM

## 2010-09-23 DIAGNOSIS — E785 Hyperlipidemia, unspecified: Secondary | ICD-10-CM

## 2010-09-23 DIAGNOSIS — I10 Essential (primary) hypertension: Secondary | ICD-10-CM

## 2010-09-23 DIAGNOSIS — Z79899 Other long term (current) drug therapy: Secondary | ICD-10-CM

## 2010-09-23 NOTE — Patient Instructions (Addendum)
Your physician recommends that you schedule a follow-up appointment in: 6 MONTHS WITH DR Jens Som Your physician recommends that you continue on your current medications as directed. Please refer to the Current Medication list given to you today. Your physician recommends that you return for lab work in: TODAY LIVER  BMET  DX V58.69  401.1

## 2010-09-23 NOTE — Progress Notes (Signed)
Addended by: Scherrie Bateman E on: 09/23/2010 09:51 AM   Modules accepted: Orders

## 2010-09-23 NOTE — Progress Notes (Signed)
Addended by: Scherrie Bateman E on: 09/23/2010 09:34 AM   Modules accepted: Orders

## 2010-09-23 NOTE — Assessment & Plan Note (Signed)
Continue statin. Check lipids and liver. 

## 2010-09-23 NOTE — Assessment & Plan Note (Signed)
Continue aspirin and statin. Recent Myoview negative. 

## 2010-09-23 NOTE — Assessment & Plan Note (Signed)
Blood pressure controlled. Continue present medications. Check potassium and renal function. 

## 2010-09-23 NOTE — Progress Notes (Signed)
HPI: 77 year old female for followup of coronary disease. Previously followed by Dr. Deborah Chalk. Patient had a cardiac catheterization in 2002 that showed a 40% left main. In April of 2012, she had a myoview that showed breast attenuation but no ischemia; EF 84. Since she was last seen she does have some dyspnea on exertion but no orthopnea, PND, pedal edema, palpitations, syncope or chest pain.  Current Outpatient Prescriptions  Medication Sig Dispense Refill  . alendronate (FOSAMAX) 70 MG tablet Take 70 mg by mouth every 7 (seven) days. Take with a full glass of water on an empty stomach.       Marland Kitchen anastrozole (ARIMIDEX) 1 MG tablet Take 1 mg by mouth daily.        Marland Kitchen aspirin 81 MG tablet Take 81 mg by mouth daily.        Marland Kitchen atenolol (TENORMIN) 25 MG tablet Take 25 mg by mouth daily.        . Calcium Carbonate-Vitamin D (CALCIUM + D PO) Take by mouth 2 (two) times daily.        . furosemide (LASIX) 20 MG tablet TAKE 1 TABLET (20 MG TOTAL) BY MOUTH DAILY.  90 tablet  1  . levothyroxine (SYNTHROID, LEVOTHROID) 75 MCG tablet Take 75 mcg by mouth daily.        Marland Kitchen losartan (COZAAR) 50 MG tablet Take 1 tablet (50 mg total) by mouth daily.  90 tablet  3  . simvastatin (ZOCOR) 20 MG tablet Take 10 mg by mouth at bedtime.       Marland Kitchen VITAMIN D, CHOLECALCIFEROL, PO Take 400 Int'l Units by mouth 1 dose over 46 hours.        Marland Kitchen DISCONTD: olmesartan (BENICAR) 20 MG tablet Take 20 mg by mouth daily.           Past Medical History  Diagnosis Date  . CAD (coronary artery disease) 07/1998    s/p cath in 2000. L main with 30 to 40%  . Obesities, morbid   . HTN (hypertension)   . Breast ca     s/p lumpectomy & radiation  . Hyperlipidemia   . ACE-inhibitor cough   . Thyroid disease   . Osteoporosis   . Hearing loss     Past Surgical History  Procedure Date  . Breast lumpectomy March 2010  . Cesarean section   . Total abdominal hysterectomy     History   Social History  . Marital Status: Married    Spouse  Name: [name]    Number of Children: N/A  . Years of Education: N/A   Occupational History  . Retired from [employer]    Social History Main Topics  . Smoking status: Former Smoker    Types: Cigarettes    Quit date: 01/13/1963  . Smokeless tobacco: Never Used   Comment: stopped in 1970's  . Alcohol Use: No  . Drug Use: No  . Sexually Active: Not on file   Other Topics Concern  . Not on file   Social History Narrative  . No narrative on file    ROS: no fevers or chills, productive cough, hemoptysis, dysphasia, odynophagia, melena, hematochezia, dysuria, hematuria, rash, seizure activity, orthopnea, PND, pedal edema, claudication. Remaining systems are negative.  Physical Exam: Well-developed well-obese in no acute distress.  Skin is warm and dry.  HEENT is normal.  Neck is supple. No thyromegaly.  Chest is clear to auscultation with normal expansion.  Cardiovascular exam is regular rate and rhythm.  Abdominal exam  nontender or distended. No masses palpated. Extremities show no edema. neuro grossly intact  ECG sinus rhythm at a rate of 60. Left anterior fascicular block.

## 2010-10-07 ENCOUNTER — Encounter (HOSPITAL_BASED_OUTPATIENT_CLINIC_OR_DEPARTMENT_OTHER): Payer: Medicare Other | Admitting: Oncology

## 2010-10-07 DIAGNOSIS — Z170 Estrogen receptor positive status [ER+]: Secondary | ICD-10-CM

## 2010-10-07 DIAGNOSIS — M25559 Pain in unspecified hip: Secondary | ICD-10-CM

## 2010-10-07 DIAGNOSIS — D0590 Unspecified type of carcinoma in situ of unspecified breast: Secondary | ICD-10-CM

## 2010-10-07 DIAGNOSIS — M25539 Pain in unspecified wrist: Secondary | ICD-10-CM

## 2010-10-07 LAB — DIFFERENTIAL
Basophils Absolute: 0
Basophils Relative: 0
Eosinophils Absolute: 0.1
Eosinophils Relative: 1
Monocytes Absolute: 0.7

## 2010-10-07 LAB — BASIC METABOLIC PANEL
CO2: 28
Glucose, Bld: 114 — ABNORMAL HIGH
Potassium: 4.3
Sodium: 138

## 2010-10-07 LAB — CBC
HCT: 39.9
Hemoglobin: 13.9
MCHC: 34.9
MCV: 91.4
RDW: 13.3

## 2010-10-22 ENCOUNTER — Ambulatory Visit (INDEPENDENT_AMBULATORY_CARE_PROVIDER_SITE_OTHER): Payer: Medicare Other | Admitting: *Deleted

## 2010-10-22 DIAGNOSIS — Z79899 Other long term (current) drug therapy: Secondary | ICD-10-CM

## 2010-10-22 DIAGNOSIS — I10 Essential (primary) hypertension: Secondary | ICD-10-CM

## 2010-10-22 LAB — BASIC METABOLIC PANEL
CO2: 30 mEq/L (ref 19–32)
Calcium: 9.1 mg/dL (ref 8.4–10.5)
Creatinine, Ser: 0.9 mg/dL (ref 0.4–1.2)

## 2010-10-22 LAB — HEPATIC FUNCTION PANEL
Bilirubin, Direct: 0.1 mg/dL (ref 0.0–0.3)
Total Protein: 7.4 g/dL (ref 6.0–8.3)

## 2010-10-30 ENCOUNTER — Encounter (INDEPENDENT_AMBULATORY_CARE_PROVIDER_SITE_OTHER): Payer: Self-pay

## 2010-11-06 ENCOUNTER — Encounter (INDEPENDENT_AMBULATORY_CARE_PROVIDER_SITE_OTHER): Payer: Self-pay | Admitting: General Surgery

## 2010-11-25 ENCOUNTER — Telehealth: Payer: Self-pay | Admitting: Oncology

## 2010-11-25 NOTE — Telephone Encounter (Signed)
per pof 03/19 called pt and scheduled appt for march2013.

## 2010-12-23 ENCOUNTER — Ambulatory Visit (INDEPENDENT_AMBULATORY_CARE_PROVIDER_SITE_OTHER): Payer: Medicare Other | Admitting: General Surgery

## 2010-12-23 VITALS — BP 140/80 | HR 60 | Temp 98.2°F | Resp 12 | Ht 60.0 in | Wt 200.0 lb

## 2010-12-23 DIAGNOSIS — D0510 Intraductal carcinoma in situ of unspecified breast: Secondary | ICD-10-CM | POA: Insufficient documentation

## 2010-12-23 DIAGNOSIS — D0590 Unspecified type of carcinoma in situ of unspecified breast: Secondary | ICD-10-CM

## 2010-12-23 NOTE — Patient Instructions (Signed)
Continue regular self exams  

## 2011-01-07 ENCOUNTER — Encounter (INDEPENDENT_AMBULATORY_CARE_PROVIDER_SITE_OTHER): Payer: Self-pay | Admitting: General Surgery

## 2011-01-07 ENCOUNTER — Other Ambulatory Visit: Payer: Self-pay | Admitting: Cardiology

## 2011-01-07 MED ORDER — ATENOLOL 25 MG PO TABS: 25.0000 mg | ORAL_TABLET | Freq: Every day | ORAL | Status: DC

## 2011-01-07 NOTE — Progress Notes (Signed)
Subjective:     Patient ID: Carmen Haney, female   DOB: [birth year], 77 y.o.   MRN: [id number]  HPI The patient is a 77 yo wf who is 2 1/2 years out from a right lumpectomy for dcis. She has been doing well and has no complaints. She denies breast pain or discharge from her nipple. She will be due for her next mammogram in Jan.  Review of Systems  Constitutional: Negative.   HENT: Negative.   Eyes: Negative.   Respiratory: Negative.   Cardiovascular: Negative.   Gastrointestinal: Negative.   Genitourinary: Negative.   Musculoskeletal: Positive for arthralgias.  Skin: Negative.   Neurological: Negative.   Hematological: Negative.   Psychiatric/Behavioral: Negative.        Objective:   Physical Exam  Constitutional: She is oriented to person, place, and time. She appears well-developed and well-nourished.  HENT:  Head: Normocephalic and atraumatic.  Eyes: Conjunctivae and EOM are normal. Pupils are equal, round, and reactive to light.  Neck: Normal range of motion. Neck supple.  Cardiovascular: Normal rate, regular rhythm and normal heart sounds.   Pulmonary/Chest: Effort normal and breath sounds normal.       No palpable mass in either breast. No axillary supraclavicular or cervical lymphadenopathy  Abdominal: Soft. Bowel sounds are normal. She exhibits no mass. There is no tenderness.  Musculoskeletal: Normal range of motion.  Lymphadenopathy:    She has no cervical adenopathy.  Neurological: She is alert and oriented to person, place, and time.  Skin: Skin is warm and dry.  Psychiatric: She has a normal mood and affect. Her behavior is normal.       Assessment:     2 1/2 years out from a right lumpectomy for dcis    Plan:     Continue regular self exams F/U in 6 months

## 2011-03-05 ENCOUNTER — Other Ambulatory Visit: Payer: Self-pay | Admitting: *Deleted

## 2011-03-05 DIAGNOSIS — I119 Hypertensive heart disease without heart failure: Secondary | ICD-10-CM

## 2011-03-05 MED ORDER — FUROSEMIDE 20 MG PO TABS: 20.0000 mg | ORAL_TABLET | Freq: Every day | ORAL | Status: DC

## 2011-03-05 MED ORDER — SIMVASTATIN 20 MG PO TABS: 10.0000 mg | ORAL_TABLET | Freq: Every day | ORAL | Status: DC

## 2011-03-05 NOTE — Telephone Encounter (Signed)
Refilled furosemide and simvastatin,has appointment with Dr. Jens Som soon

## 2011-03-06 ENCOUNTER — Other Ambulatory Visit: Payer: Self-pay | Admitting: Family Medicine

## 2011-03-06 DIAGNOSIS — Z853 Personal history of malignant neoplasm of breast: Secondary | ICD-10-CM

## 2011-03-24 ENCOUNTER — Ambulatory Visit (INDEPENDENT_AMBULATORY_CARE_PROVIDER_SITE_OTHER): Payer: Medicare Other | Admitting: Cardiology

## 2011-03-24 ENCOUNTER — Encounter: Payer: Self-pay | Admitting: Cardiology

## 2011-03-24 DIAGNOSIS — I2510 Atherosclerotic heart disease of native coronary artery without angina pectoris: Secondary | ICD-10-CM

## 2011-03-24 DIAGNOSIS — I10 Essential (primary) hypertension: Secondary | ICD-10-CM

## 2011-03-24 DIAGNOSIS — E785 Hyperlipidemia, unspecified: Secondary | ICD-10-CM

## 2011-03-24 NOTE — Patient Instructions (Signed)
Your physician wants you to follow-up in: 6 months  You will receive a reminder letter in the mail two months in advance. If you don't receive a letter, please call our office to schedule the follow-up appointment.  Your physician recommends that you continue on your current medications as directed. Please refer to the Current Medication list given to you today.  

## 2011-03-24 NOTE — Assessment & Plan Note (Signed)
Continue statin. 

## 2011-03-24 NOTE — Assessment & Plan Note (Signed)
Continue aspirin and statin. 

## 2011-03-24 NOTE — Progress Notes (Signed)
HPI: Pleasant female for followup of coronary disease. Patient had a cardiac catheterization in 2002 that showed a 40% left main. In April of 2012, she had a myoview that showed breast attenuation but no ischemia; EF 84. Since she was last seen in Sept 2012 she does have some dyspnea on exertion but no orthopnea, PND, pedal edema, palpitations, syncope or chest pain.   Current Outpatient Prescriptions  Medication Sig Dispense Refill  . alendronate (FOSAMAX) 70 MG tablet Take 70 mg by mouth every 7 (seven) days. Take with a full glass of water on an empty stomach.       Marland Kitchen anastrozole (ARIMIDEX) 1 MG tablet Take 1 mg by mouth daily.        Marland Kitchen aspirin 81 MG tablet Take 81 mg by mouth daily.        Marland Kitchen atenolol (TENORMIN) 25 MG tablet Take 1 tablet (25 mg total) by mouth daily.  90 tablet  1  . Calcium Carbonate-Vitamin D (CALCIUM + D PO) Take by mouth 2 (two) times daily.        . furosemide (LASIX) 20 MG tablet Take 1 tablet (20 mg total) by mouth daily.  90 tablet  1  . levothyroxine (SYNTHROID, LEVOTHROID) 75 MCG tablet Take 75 mcg by mouth daily.        Marland Kitchen losartan (COZAAR) 50 MG tablet Take 1 tablet (50 mg total) by mouth daily.  90 tablet  3  . simvastatin (ZOCOR) 20 MG tablet Take 0.5 tablets (10 mg total) by mouth at bedtime.  30 tablet  2  . DISCONTD: olmesartan (BENICAR) 20 MG tablet Take 20 mg by mouth daily.           Past Medical History  Diagnosis Date  . CAD (coronary artery disease) 07/1998    s/p cath in 2000. L main with 30 to 40%  . Obesities, morbid   . HTN (hypertension)   . Breast CA     s/p lumpectomy & radiation  . Hyperlipidemia   . ACE-inhibitor cough   . Thyroid disease   . Osteoporosis   . Hearing loss     Past Surgical History  Procedure Date  . Breast lumpectomy March 2010  . Cesarean section   . Total abdominal hysterectomy     History   Social History  . Marital Status: Married    Spouse Name: [name]    Number of Children: N/A  . Years of  Education: N/A   Occupational History  . Retired from [employer]    Social History Main Topics  . Smoking status: Former Smoker    Types: Cigarettes    Quit date: 01/13/1963  . Smokeless tobacco: Never Used   Comment: stopped in 1970's  . Alcohol Use: No  . Drug Use: No  . Sexually Active: Not on file   Other Topics Concern  . Not on file   Social History Narrative  . No narrative on file    ROS: no fevers or chills, productive cough, hemoptysis, dysphasia, odynophagia, melena, hematochezia, dysuria, hematuria, rash, seizure activity, orthopnea, PND, pedal edema, claudication. Remaining systems are negative.  Physical Exam: Well-developed obese in no acute distress.  Skin is warm and dry.  HEENT is normal.  Neck is supple. No thyromegaly.  Chest is clear to auscultation with normal expansion.  Cardiovascular exam is regular rate and rhythm.  Abdominal exam nontender or distended. No masses palpated. Extremities show no edema. neuro grossly intact  ECG sinus rhythm at  a rate of 59. Left axis deviation. Poor R-wave progression.

## 2011-03-24 NOTE — Assessment & Plan Note (Signed)
Blood pressure controlled. Continue present medications. 

## 2011-03-25 ENCOUNTER — Ambulatory Visit
Admission: RE | Admit: 2011-03-25 | Discharge: 2011-03-25 | Disposition: A | Discharge status: Home / Self Care | Payer: Medicare Other | Source: Ambulatory Visit | Attending: Family Medicine | Admitting: Family Medicine

## 2011-03-25 DIAGNOSIS — Z853 Personal history of malignant neoplasm of breast: Secondary | ICD-10-CM

## 2011-03-30 ENCOUNTER — Other Ambulatory Visit: Payer: Self-pay | Admitting: Cardiology

## 2011-03-30 ENCOUNTER — Telehealth: Payer: Self-pay | Admitting: Oncology

## 2011-03-30 ENCOUNTER — Other Ambulatory Visit: Payer: Self-pay | Admitting: Nurse Practitioner

## 2011-03-30 NOTE — Telephone Encounter (Signed)
R/s 3/19 appt to 3/22 @ 11:15 am due to AB out sick. lmonvm for pt re change w/new d/t.

## 2011-03-31 ENCOUNTER — Ambulatory Visit: Payer: Medicare Other | Admitting: Physician Assistant

## 2011-03-31 ENCOUNTER — Other Ambulatory Visit: Payer: Medicare Other | Admitting: Lab

## 2011-04-03 ENCOUNTER — Encounter: Payer: Self-pay | Admitting: Physician Assistant

## 2011-04-03 ENCOUNTER — Other Ambulatory Visit: Payer: Self-pay | Admitting: Physician Assistant

## 2011-04-03 ENCOUNTER — Other Ambulatory Visit (HOSPITAL_BASED_OUTPATIENT_CLINIC_OR_DEPARTMENT_OTHER): Payer: Medicare Other

## 2011-04-03 ENCOUNTER — Other Ambulatory Visit: Payer: Self-pay | Admitting: *Deleted

## 2011-04-03 ENCOUNTER — Telehealth: Payer: Self-pay | Admitting: *Deleted

## 2011-04-03 ENCOUNTER — Ambulatory Visit (HOSPITAL_BASED_OUTPATIENT_CLINIC_OR_DEPARTMENT_OTHER): Payer: Medicare Other | Admitting: Physician Assistant

## 2011-04-03 VITALS — BP 130/86 | HR 63 | Temp 98.3°F | Ht 63.0 in | Wt 204.6 lb

## 2011-04-03 DIAGNOSIS — Z170 Estrogen receptor positive status [ER+]: Secondary | ICD-10-CM

## 2011-04-03 DIAGNOSIS — D0510 Intraductal carcinoma in situ of unspecified breast: Secondary | ICD-10-CM

## 2011-04-03 DIAGNOSIS — C50911 Malignant neoplasm of unspecified site of right female breast: Secondary | ICD-10-CM

## 2011-04-03 DIAGNOSIS — C50219 Malignant neoplasm of upper-inner quadrant of unspecified female breast: Secondary | ICD-10-CM

## 2011-04-03 DIAGNOSIS — D0590 Unspecified type of carcinoma in situ of unspecified breast: Secondary | ICD-10-CM

## 2011-04-03 LAB — CBC WITH DIFFERENTIAL/PLATELET
Eosinophils Absolute: 0.2 10*3/uL (ref 0.0–0.5)
LYMPH%: 21.6 % (ref 14.0–49.7)
MONO#: 0.5 10*3/uL (ref 0.1–0.9)
NEUT#: 3.3 10*3/uL (ref 1.5–6.5)
Platelets: 227 10*3/uL (ref 145–400)
RBC: 4.25 10*6/uL (ref 3.70–5.45)
RDW: 13.6 % (ref 11.2–14.5)
WBC: 5.2 10*3/uL (ref 3.9–10.3)

## 2011-04-03 LAB — COMPREHENSIVE METABOLIC PANEL
Albumin: 4 g/dL (ref 3.5–5.2)
CO2: 29 mEq/L (ref 19–32)
Calcium: 9 mg/dL (ref 8.4–10.5)
Chloride: 103 mEq/L (ref 96–112)
Glucose, Bld: 114 mg/dL — ABNORMAL HIGH (ref 70–99)
Potassium: 4.1 mEq/L (ref 3.5–5.3)
Sodium: 140 mEq/L (ref 135–145)
Total Protein: 6.3 g/dL (ref 6.0–8.3)

## 2011-04-03 MED ORDER — ANASTROZOLE 1 MG PO TABS: 1.0000 mg | ORAL_TABLET | Freq: Every day | ORAL | Status: DC

## 2011-04-03 NOTE — Progress Notes (Signed)
ID: Carmen Haney   DOB: 12-06-1930  MR#: [id number]  XBJ#:[id number]  HISTORY OF PRESENT ILLNESS: Carmen Haney has a significant family history of breast cancer and has been followed closely through Dr. Cherlyn Labella office.  She had screening diagnostic mammography 12/27/2007, which only showed scattered parenchymal density.  Even though the mammogram was negative because of the family history, Dr. Yolanda Bonine felt an MRI should be obtained for screening and this was done on 03/01/2008.  It showed an asymmetric area of enhancement in the upper inner quadrant of the right breast measuring up to 1.3 cm; an incidental hepatic cyst was also noted.  With this information, ultrasound of the right breast was obtained but this failed to show the abnormality in question, so the patient underwent MRI-guided biopsy of the right breast area of abnormality on 03/26/2008.  The final pathology ([id number] and [id number]) showed a low-grade ductal carcinoma in situ with no evidence of an invasive component, 100% ER and 100% PR positive.    With this information, the patient was referred to Dr. Carolynne Edouard, and after appropriate discussion on 04/24/2008, she underwent needle localization lumpectomy with the final pathology [id number]) showing a grade 1 ductal carcinoma in situ measuring 8 mm with negative margins, the closest being 2 mm and the specimen being intact.    The patient underwent radiation therapy which was completed in June of 2010. She has been on anastrozole since that time, 1 mg daily with good tolerance.  INTERVAL HISTORY: Carmen Haney returns today accompanied by her husband, Carmen Haney, for routine six-month followup of her right breast carcinoma. Interval history is unremarkable, and that he has been feeling well. She continues to have some problems with a "trigger finger" in her right hand and plans to followup with Dr. Teressa Senter, under whom she has had surgery in the past. She continues on anastrozole daily with no associated side  effects.   REVIEW OF SYSTEMS: Physically, in fact, [name] denies any hot flashes, increased vaginal dryness, or increased joint pain. She does have some arthritis which is not new and has not worsened. She's had no nausea or emesis. No change in bowel habits. She had a recent upper respiratory infection has a lingering cough but this is slowly improving, and she denies any fevers, chills, night sweats, or shortness of breath.  A detailed review of systems is otherwise noncontributory   PAST MEDICAL HISTORY: Past Medical History  Diagnosis Date  . CAD (coronary artery disease) 07/1998    s/p cath in 2000. L main with 30 to 40%  . Obesities, morbid   . HTN (hypertension)   . Breast CA     s/p lumpectomy & radiation  . Hyperlipidemia   . ACE-inhibitor cough   . Thyroid disease   . Osteoporosis   . Hearing loss     PAST SURGICAL HISTORY: Past Surgical History  Procedure Date  . Breast lumpectomy March 2010  . Cesarean section   . Total abdominal hysterectomy     FAMILY HISTORY Family History  Problem Relation Age of Onset  . Coronary artery disease Mother   . Hypertension Mother   . Breast cancer Mother   . Melanoma Mother   . Prostate cancer Father 43   GYNECOLOGIC HISTORY:  She is GX P2, first pregnancy to term age 71.  The patient does not know when she went through menopause since she had a hysterectomy.  She never took hormones.  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  Her husband, Carmen Haney, used to work for [employer] in Firefighter, later taught math, Environmental manager, and computers in the local school.  They are both retired.  Their daughter, Carmen Haney,  does childcare at her church.  Daughter, Carmen Haney, works in occupational therapy.  The patient has 2 grandchildren.  The patient is not a church attender.  She previously attended [employer].    ADVANCED DIRECTIVES:  HEALTH MAINTENANCE: History  Substance Use Topics  . Smoking  status: Former Smoker    Types: Cigarettes    Quit date: 01/13/1963  . Smokeless tobacco: Never Used   Comment: stopped in 1970's  . Alcohol Use: No     Colonoscopy:  PAP:  Bone density:  Lipid panel:  Allergies  Allergen Reactions  . Ace Inhibitors Cough  . Latex     Current Outpatient Prescriptions  Medication Sig Dispense Refill  . alendronate (FOSAMAX) 70 MG tablet Take 70 mg by mouth every 7 (seven) days. Take with a full glass of water on an empty stomach.       Marland Kitchen anastrozole (ARIMIDEX) 1 MG tablet Take 1 tablet (1 mg total) by mouth daily.  90 tablet  3  . aspirin 81 MG tablet Take 81 mg by mouth daily.        Marland Kitchen atenolol (TENORMIN) 25 MG tablet TAKE 1 TABLET (25 MG TOTAL) BY MOUTH DAILY.  90 tablet  0  . Calcium Carbonate-Vitamin D (CALCIUM + D PO) Take by mouth 2 (two) times daily.        . furosemide (LASIX) 20 MG tablet Take 1 tablet (20 mg total) by mouth daily.  90 tablet  1  . levothyroxine (SYNTHROID, LEVOTHROID) 75 MCG tablet Take 75 mcg by mouth daily.        Marland Kitchen losartan (COZAAR) 50 MG tablet TAKE 1 TABLET (50 MG TOTAL) BY MOUTH DAILY.  90 tablet  2  . simvastatin (ZOCOR) 20 MG tablet Take 0.5 tablets (10 mg total) by mouth at bedtime.  30 tablet  2  . DISCONTD: anastrozole (ARIMIDEX) 1 MG tablet Take 1 mg by mouth daily.        Marland Kitchen DISCONTD: olmesartan (BENICAR) 20 MG tablet Take 20 mg by mouth daily.          OBJECTIVE: Filed Vitals:   04/03/11 1144  BP: 130/86  Pulse: 63  Temp: 98.3 F (36.8 C)     Body mass index is 36.24 kg/(m^2).    ECOG FS: 0  Physical Exam: HEENT:  Sclerae anicteric, conjunctivae pink.   Nodes:  No cervical, supraclavicular, or axillary lymphadenopathy palpated.  Breast Exam: Right breast, status post lumpectomy with no suspicious nodularities, skin changes, or evidence of local recurrence. Breast is somewhat tender to palpation. Left breast is unremarkable with no masses, skin changes, or nipple inversion.   Lungs:  Clear to  auscultation bilaterally.  No crackles, rhonchi, or wheezes.   Heart:  Regular rate and rhythm.   Abdomen:  Soft, nontender.  Positive bowel sounds.  No organomegaly or masses palpated.   Musculoskeletal:  No focal spinal tenderness to palpation.  Extremities:  Benign.  No peripheral edema or cyanosis.   Skin:  Benign.   Neuro:  Nonfocal.    LAB RESULTS: Lab Results  Component Value Date   WBC 5.2 04/03/2011   NEUTROABS 3.3 04/03/2011   HGB 13.4 04/03/2011   HCT 39.3 04/03/2011   MCV 92.4 04/03/2011   PLT 227 04/03/2011  Chemistry      Component Value Date/Time   NA 138 10/22/2010 1121   K 4.4 10/22/2010 1121   CL 99 10/22/2010 1121   CO2 30 10/22/2010 1121   BUN 18 10/22/2010 1121   CREATININE 0.9 10/22/2010 1121      Component Value Date/Time   CALCIUM 9.1 10/22/2010 1121   ALKPHOS 70 10/22/2010 1121   AST 18 10/22/2010 1121   ALT 15 10/22/2010 1121   BILITOT 0.8 10/22/2010 1121       STUDIES: Mm Digital Diagnostic Bilat  03/25/2011  *RADIOLOGY REPORT*  Clinical Data:  Lumpectomy right breast 2010 for annual mammogram  DIGITAL DIAGNOSTIC BILATERAL MAMMOGRAM WITH CAD  Comparison:  02/05/2010, 01/08/2009, 12/27/2007  Findings:  Scattered fibroglandular densities.  No suspicious findings.  No mass or architectural distortion.  No change from prior studies.  Tangential spot compression view over the area of scarring on the right reveals no change. Mammographic images were processed with CAD.  IMPRESSION: Stable benign postoperative findings  BI-RADS CATEGORY 2:  Benign finding(s).  Recommendation:  Diagnostic bilateral mammogram in 1 year.  Original Report Authenticated By: [email or web address]    ASSESSMENT: A 78 year old [place] woman   (1)  status post right lumpectomy April 2010 for an 8-mm ductal carcinoma in situ, low grade, strongly estrogen and progesterone receptors positive, with negative margins,   (2)  status post radiation completed June 2010,   (3)  on anastrozole  since June 2010, the plan being to continue anastrozole for a total of 5 years.  PLAN: With regards to her breast cancer, Carmen Haney is doing well, and will continue on the anastrozole which I have refilled for another year. There is no clinical evidence of disease recurrence at this time. She will continue to be followed regularly by her primary care physician, Dr. Maurice Small, but knows to call us prior to her next scheduled appointment with any changes or problems. We'll continue to see her on a six-month basis, next appointment being September of 2013.  Dewayne Jurek    04/03/2011

## 2011-04-03 NOTE — Telephone Encounter (Signed)
gave patient appointment for 09-2011 printed out calendar and gave to the patient 

## 2011-06-15 ENCOUNTER — Telehealth: Payer: Self-pay | Admitting: Oncology

## 2011-06-15 NOTE — Telephone Encounter (Signed)
r/s the sept appts to oct due to a change in the md's schedule and the pt is aware.

## 2011-06-29 ENCOUNTER — Other Ambulatory Visit: Payer: Self-pay | Admitting: Cardiology

## 2011-07-07 ENCOUNTER — Other Ambulatory Visit: Payer: Self-pay | Admitting: Dermatology

## 2011-09-06 ENCOUNTER — Other Ambulatory Visit: Payer: Self-pay | Admitting: Cardiology

## 2011-09-22 ENCOUNTER — Ambulatory Visit (INDEPENDENT_AMBULATORY_CARE_PROVIDER_SITE_OTHER): Payer: Medicare Other | Admitting: General Surgery

## 2011-09-28 ENCOUNTER — Encounter (INDEPENDENT_AMBULATORY_CARE_PROVIDER_SITE_OTHER): Payer: Self-pay | Admitting: General Surgery

## 2011-09-28 ENCOUNTER — Ambulatory Visit (INDEPENDENT_AMBULATORY_CARE_PROVIDER_SITE_OTHER): Payer: Medicare Other | Admitting: General Surgery

## 2011-09-28 VITALS — BP 136/72 | HR 67 | Temp 98.2°F | Ht 63.0 in | Wt 199.2 lb

## 2011-09-28 DIAGNOSIS — D0510 Intraductal carcinoma in situ of unspecified breast: Secondary | ICD-10-CM

## 2011-09-28 DIAGNOSIS — D0590 Unspecified type of carcinoma in situ of unspecified breast: Secondary | ICD-10-CM

## 2011-09-28 NOTE — Progress Notes (Signed)
Subjective:     Patient ID: Carmen Haney, female   DOB: [birth year], 78 y.o.   MRN: [id number]  HPI The patient is a 78 year old white female who is 3 years out from a right lumpectomy for DCIS. She notes that she has a cold and doesn't feel well today. She has also had some soreness of her right ribs beneath her breasts. This has been a chronic sort of complaint for her.  Review of Systems  Constitutional: Negative.   HENT: Negative.   Eyes: Negative.   Respiratory: Negative.   Cardiovascular: Positive for chest pain.  Gastrointestinal: Negative.   Genitourinary: Negative.   Musculoskeletal: Negative.   Skin: Negative.   Neurological: Negative.   Hematological: Negative.   Psychiatric/Behavioral: Negative.        Objective:   Physical Exam  Constitutional: She is oriented to person, place, and time. She appears well-developed and well-nourished.  HENT:  Head: Normocephalic and atraumatic.  Eyes: Conjunctivae normal and EOM are normal. Pupils are equal, round, and reactive to light.  Neck: Normal range of motion. Neck supple.  Cardiovascular: Normal rate, regular rhythm and normal heart sounds.   Pulmonary/Chest: Effort normal and breath sounds normal.       There is no palpable mass in either breast. She does have some chest wall soreness inferiorly in the right breast. There is no palpable axillary supraclavicular or cervical lymphadenopathy  Abdominal: Soft. Bowel sounds are normal. She exhibits no mass. There is no tenderness.  Musculoskeletal: Normal range of motion.  Lymphadenopathy:    She has no cervical adenopathy.  Neurological: She is alert and oriented to person, place, and time.  Skin: Skin is warm and dry.  Psychiatric: She has a normal mood and affect. Her behavior is normal.       Assessment:     Three-year status post right lumpectomy for DCIS    Plan:     At this point she will continue to do regular self exams. She will continue to take arimidex  daily. We will plan to see her back in about 6 months.

## 2011-09-28 NOTE — Patient Instructions (Signed)
Continue regular self exams Continue arimidex 

## 2011-09-29 ENCOUNTER — Other Ambulatory Visit: Payer: Self-pay | Admitting: Dermatology

## 2011-10-06 ENCOUNTER — Ambulatory Visit: Payer: Medicare Other | Admitting: Oncology

## 2011-10-06 ENCOUNTER — Other Ambulatory Visit: Payer: Medicare Other | Admitting: Lab

## 2011-10-13 ENCOUNTER — Ambulatory Visit: Payer: Medicare Other | Admitting: Oncology

## 2011-10-13 ENCOUNTER — Other Ambulatory Visit: Payer: Medicare Other | Admitting: Lab

## 2011-10-15 ENCOUNTER — Telehealth: Payer: Self-pay | Admitting: *Deleted

## 2011-10-15 ENCOUNTER — Ambulatory Visit (HOSPITAL_BASED_OUTPATIENT_CLINIC_OR_DEPARTMENT_OTHER): Payer: Medicare Other | Admitting: Oncology

## 2011-10-15 ENCOUNTER — Other Ambulatory Visit (HOSPITAL_BASED_OUTPATIENT_CLINIC_OR_DEPARTMENT_OTHER): Payer: Medicare Other | Admitting: Lab

## 2011-10-15 VITALS — BP 155/81 | HR 60 | Temp 98.7°F | Resp 20 | Ht 63.0 in | Wt 210.1 lb

## 2011-10-15 DIAGNOSIS — Z170 Estrogen receptor positive status [ER+]: Secondary | ICD-10-CM

## 2011-10-15 DIAGNOSIS — D0590 Unspecified type of carcinoma in situ of unspecified breast: Secondary | ICD-10-CM

## 2011-10-15 DIAGNOSIS — D0510 Intraductal carcinoma in situ of unspecified breast: Secondary | ICD-10-CM

## 2011-10-15 LAB — CBC WITH DIFFERENTIAL/PLATELET
BASO%: 0.6 % (ref 0.0–2.0)
Basophils Absolute: 0 10e3/uL (ref 0.0–0.1)
EOS%: 1.9 % (ref 0.0–7.0)
Eosinophils Absolute: 0.1 10e3/uL (ref 0.0–0.5)
HCT: 40.8 % (ref 34.8–46.6)
HGB: 14 g/dL (ref 11.6–15.9)
LYMPH%: 23.4 % (ref 14.0–49.7)
MCH: 32.1 pg (ref 25.1–34.0)
MCHC: 34.5 g/dL (ref 31.5–36.0)
MCV: 93.3 fL (ref 79.5–101.0)
MONO#: 0.6 10e3/uL (ref 0.1–0.9)
MONO%: 10.1 % (ref 0.0–14.0)
NEUT#: 3.9 10e3/uL (ref 1.5–6.5)
NEUT%: 64 % (ref 38.4–76.8)
Platelets: 225 10e3/uL (ref 145–400)
RBC: 4.37 10e6/uL (ref 3.70–5.45)
RDW: 13.2 % (ref 11.2–14.5)
WBC: 6.1 10e3/uL (ref 3.9–10.3)
lymph#: 1.4 10e3/uL (ref 0.9–3.3)

## 2011-10-15 LAB — COMPREHENSIVE METABOLIC PANEL (CC13)
ALT: 16 U/L (ref 0–55)
Albumin: 3.9 g/dL (ref 3.5–5.0)
Alkaline Phosphatase: 74 U/L (ref 40–150)
CO2: 28 mEq/L (ref 22–29)
Glucose: 103 mg/dl — ABNORMAL HIGH (ref 70–99)
Potassium: 4.5 mEq/L (ref 3.5–5.1)
Sodium: 138 mEq/L (ref 136–145)
Total Protein: 7 g/dL (ref 6.4–8.3)

## 2011-10-15 NOTE — Progress Notes (Signed)
ID: Carmen Haney   DOB: 1930-02-14  MR#: [id number]  NWG#:[id number]  HISTORY OF PRESENT ILLNESS: Ms. Heckman has a significant family history of breast cancer and has been followed closely through Dr. Cherlyn Labella office.  She had screening diagnostic mammography 12/27/2007, which only showed scattered parenchymal density.  Even though the mammogram was negative because of the family history, Dr. Yolanda Bonine felt an MRI should be obtained for screening and this was done on 03/01/2008.  It showed an asymmetric area of enhancement in the upper inner quadrant of the right breast measuring up to 1.3 cm; an incidental hepatic cyst was also noted.  With this information, ultrasound of the right breast was obtained but this failed to show the abnormality in question, so the patient underwent MRI-guided biopsy of the right breast area of abnormality on 03/26/2008.  The final pathology ([id number]) showed a low-grade ductal carcinoma in situ with no evidence of an invasive component, 100% ER and 100% PR positive.    With this information, the patient was referred to Dr. Carolynne Edouard, and after appropriate discussion on 04/24/2008, she underwent needle localization lumpectomy with the final pathology [id number]) showing a grade 1 ductal carcinoma in situ measuring 8 mm with negative margins, the closest being 2 mm and the specimen being intact. The patient underwent radiation therapy which was completed in June of 2010. She has been on anastrozole since that time. Her subsequent history as detailed below.  INTERVAL HISTORY: Carmen Haney returns today accompanied by her husband, Carmen Haney, for routine  followup of her noninvasive breast cancer. The interval history is generally unremarkable. She had a nose biopsy which was benign, not basal cell, and she continues to receive steroid shots in her hands through [employer] office, which she says really helps.   REVIEW OF SYSTEMS: Her biggest problem is pain in various places, and  particularly her hips. She can barely walk, she says. She and her husband will celebrate their 50th wedding anniversary next year by going to [place], and she is dreading the fact that she will not be able to walk of there. She describes herself is tired, significant sleep problems, hearing loss, runny nose, swelling ankles, shortness of breath when of walking particularly on the slope or up stairs, and trouble using her hands. She is tolerating the anastrozole with no hot flashes or vaginal dryness issues. A detailed review of systems was otherwise noncontributory   PAST MEDICAL HISTORY: Past Medical History  Diagnosis Date  . CAD (coronary artery disease) 07/1998    s/p cath in 2000. L main with 30 to 40%  . Obesities, morbid   . HTN (hypertension)   . Breast CA     s/p lumpectomy & radiation  . Hyperlipidemia   . ACE-inhibitor cough   . Thyroid disease   . Osteoporosis   . Hearing loss     PAST SURGICAL HISTORY: Past Surgical History  Procedure Date  . Breast lumpectomy March 2010  . Cesarean section   . Total abdominal hysterectomy     FAMILY HISTORY Family History  Problem Relation Age of Onset  . Coronary artery disease Mother   . Hypertension Mother   . Breast cancer Mother   . Melanoma Mother   . Prostate cancer Father 61   GYNECOLOGIC HISTORY:  She is GX P2, first pregnancy to term age 11.  The patient does not know when she went through menopause since she had a hysterectomy.  She never took hormones.  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  Her husband, Carmen Haney, used to work for [employer] in Firefighter, later taught math, Environmental manager, and computers in the local school.  They are both retired.  Their daughter, Carmen Haney,  does childcare at her church.  [name] works in occupational therapy.  The patient has 2 grandchildren.  The patient is not a church attender.  She previously attended [employer].    ADVANCED  DIRECTIVES: not in place  HEALTH MAINTENANCE: History  Substance Use Topics  . Smoking status: Former Smoker    Types: Cigarettes    Quit date: 01/13/1963  . Smokeless tobacco: Never Used   Comment: stopped in 1970's  . Alcohol Use: No     Colonoscopy:  PAP:  Bone density:  Lipid panel:  Allergies  Allergen Reactions  . Ace Inhibitors Cough  . Latex     Current Outpatient Prescriptions  Medication Sig Dispense Refill  . alendronate (FOSAMAX) 70 MG tablet Take 70 mg by mouth every 7 (seven) days. Take with a full glass of water on an empty stomach.       Marland Kitchen anastrozole (ARIMIDEX) 1 MG tablet Take 1 tablet (1 mg total) by mouth daily.  90 tablet  3  . aspirin 81 MG tablet Take 81 mg by mouth daily.        Marland Kitchen atenolol (TENORMIN) 25 MG tablet TAKE 1 TABLET (25 MG TOTAL) BY MOUTH DAILY.  90 tablet  3  . Calcium Carbonate-Vitamin D (CALCIUM + D PO) Take by mouth 2 (two) times daily.        . furosemide (LASIX) 20 MG tablet TAKE 1 TABLET (20 MG TOTAL) BY MOUTH DAILY.  90 tablet  0  . levothyroxine (SYNTHROID, LEVOTHROID) 75 MCG tablet Take 75 mcg by mouth daily.        Marland Kitchen losartan (COZAAR) 50 MG tablet TAKE 1 TABLET (50 MG TOTAL) BY MOUTH DAILY.  90 tablet  2  . simvastatin (ZOCOR) 20 MG tablet Take 0.5 tablets (10 mg total) by mouth at bedtime.  30 tablet  2  . DISCONTD: olmesartan (BENICAR) 20 MG tablet Take 20 mg by mouth daily.          OBJECTIVE: Elderly white woman in no acute distress  Filed Vitals:   10/15/11 1149  BP: 155/81  Pulse: 60  Temp: 98.7 F (37.1 C)  Resp: 20     Body mass index is 37.22 kg/(m^2).    ECOG FS: 0  Sclerae unicteric Oropharynx clear No cervical or supraclavicular adenopathy Lungs no rales or rhonchi Heart regular rate and rhythm Abd benign MSK no focal spinal tenderness, grade 1 bilateral ankle edema Neuro: nonfocal Breasts: The right breast is status post lumpectomy. She thought she could feel a "little ball" in the right axilla, but she  was not able to feel that today, and to my exam the right axilla was entirely benign. There is no evidence of local recurrence. The left breast was unremarkable.    LAB RESULTS: Lab Results  Component Value Date   WBC 6.1 10/15/2011   NEUTROABS 3.9 10/15/2011   HGB 14.0 10/15/2011   HCT 40.8 10/15/2011   MCV 93.3 10/15/2011   PLT 225 10/15/2011      Chemistry      Component Value Date/Time   NA 140 04/03/2011 1207   K 4.1 04/03/2011 1207   CL 103 04/03/2011 1207   CO2 29 04/03/2011 1207   BUN 16  04/03/2011 1207   CREATININE 0.97 04/03/2011 1207      Component Value Date/Time   CALCIUM 9.0 04/03/2011 1207   ALKPHOS 69 04/03/2011 1207   AST 17 04/03/2011 1207   ALT 15 04/03/2011 1207   BILITOT 0.5 04/03/2011 1207       STUDIES: Mm Digital Diagnostic Bilat  03/25/2011  *RADIOLOGY REPORT*  Clinical Data:  Lumpectomy right breast 2010 for annual mammogram  DIGITAL DIAGNOSTIC BILATERAL MAMMOGRAM WITH CAD  Comparison:  02/05/2010, 01/08/2009, 12/27/2007  Findings:  Scattered fibroglandular densities.  No suspicious findings.  No mass or architectural distortion.  No change from prior studies.  Tangential spot compression view over the area of scarring on the right reveals no change. Mammographic images were processed with CAD.  IMPRESSION: Stable benign postoperative findings  BI-RADS CATEGORY 2:  Benign finding(s).  Recommendation:  Diagnostic bilateral mammogram in 1 year.  Original Report Authenticated By: [email or web address]    ASSESSMENT: A 78 year old [place] woman   (1)  status post right lumpectomy April 2010 for an 8-mm ductal carcinoma in situ, low grade, strongly estrogen and progesterone receptors positive, with negative margins,   (2)  status post radiation completed June 2010,   (3)  on anastrozole since June 2010, the plan being to continue anastrozole for a total of 5 years.  PLAN: Yazaira is doing fine from a breast cancer point of view, has an excellent prognosis, and will see [place] again  in one year. I strongly encouraged her to increase her exercise tolerance, and gave her a pamphlet on the [employer] program. We also discussed water aerobics. She knows to call for any problems that may develop before the next visit  MAGRINAT,GUSTAV C    10/15/2011

## 2011-10-15 NOTE — Telephone Encounter (Signed)
Gave patient appointment for 10-20-2012 starting at 11:00am

## 2011-10-24 ENCOUNTER — Other Ambulatory Visit: Payer: Self-pay | Admitting: Cardiology

## 2011-10-26 ENCOUNTER — Other Ambulatory Visit: Payer: Self-pay | Admitting: Oncology

## 2011-10-26 DIAGNOSIS — C50919 Malignant neoplasm of unspecified site of unspecified female breast: Secondary | ICD-10-CM

## 2012-03-17 ENCOUNTER — Encounter: Payer: Self-pay | Admitting: Cardiology

## 2012-03-17 ENCOUNTER — Ambulatory Visit (INDEPENDENT_AMBULATORY_CARE_PROVIDER_SITE_OTHER): Payer: Medicare Other | Admitting: Cardiology

## 2012-03-17 VITALS — BP 122/66 | HR 54 | Ht 63.0 in | Wt 209.0 lb

## 2012-03-17 DIAGNOSIS — I10 Essential (primary) hypertension: Secondary | ICD-10-CM

## 2012-03-17 DIAGNOSIS — E785 Hyperlipidemia, unspecified: Secondary | ICD-10-CM

## 2012-03-17 DIAGNOSIS — I2510 Atherosclerotic heart disease of native coronary artery without angina pectoris: Secondary | ICD-10-CM

## 2012-03-17 NOTE — Assessment & Plan Note (Signed)
Continue statin. Lipids and liver monitored by primary care. 

## 2012-03-17 NOTE — Assessment & Plan Note (Signed)
Blood pressure controlled. Continue present medications. Potassium and renal function monitored by primary care. 

## 2012-03-17 NOTE — Progress Notes (Signed)
HPI: Pleasant female for followup of coronary disease. Patient had a cardiac catheterization in 2002 that showed a 40% left main. In April of 2012, she had a myoview that showed breast attenuation but no ischemia; EF 84. Since she was last seen in March of 2013 she does have some dyspnea on exertion but no orthopnea, PND, pedal edema, palpitations, syncope or chest pain.   Current Outpatient Prescriptions  Medication Sig Dispense Refill  . alendronate (FOSAMAX) 70 MG tablet Take 70 mg by mouth every 7 (seven) days. Take with a full glass of water on an empty stomach.       Marland Kitchen anastrozole (ARIMIDEX) 1 MG tablet TAKE 1 TABLET BY MOUTH DAILY  90 tablet  3  . aspirin 81 MG tablet Take 81 mg by mouth daily.        Marland Kitchen atenolol (TENORMIN) 25 MG tablet TAKE 1 TABLET (25 MG TOTAL) BY MOUTH DAILY.  90 tablet  3  . Calcium Carbonate-Vitamin D (CALCIUM + D PO) Take by mouth 2 (two) times daily.        . furosemide (LASIX) 20 MG tablet TAKE 1 TABLET (20 MG TOTAL) BY MOUTH DAILY.  90 tablet  0  . levothyroxine (SYNTHROID, LEVOTHROID) 75 MCG tablet Take 75 mcg by mouth daily.        Marland Kitchen losartan (COZAAR) 50 MG tablet TAKE 1 TABLET (50 MG TOTAL) BY MOUTH DAILY.  90 tablet  1  . simvastatin (ZOCOR) 20 MG tablet Take 0.5 tablets (10 mg total) by mouth at bedtime.  30 tablet  2  . [DISCONTINUED] olmesartan (BENICAR) 20 MG tablet Take 20 mg by mouth daily.         No current facility-administered medications for this visit.     Past Medical History  Diagnosis Date  . CAD (coronary artery disease) 07/1998    s/p cath in 2000. L main with 30 to 40%  . Obesities, morbid   . HTN (hypertension)   . Breast CA     s/p lumpectomy & radiation  . Hyperlipidemia   . ACE-inhibitor cough   . Thyroid disease   . Osteoporosis   . Hearing loss     Past Surgical History  Procedure Laterality Date  . Breast lumpectomy  March 2010  . Cesarean section    . Total abdominal hysterectomy      History   Social  History  . Marital Status: Married    Spouse Name: [name]    Number of Children: N/A  . Years of Education: N/A   Occupational History  . Retired from [employer]    Social History Main Topics  . Smoking status: Former Smoker    Types: Cigarettes    Quit date: 01/13/1963  . Smokeless tobacco: Never Used     Comment: stopped in 1970's  . Alcohol Use: No  . Drug Use: No  . Sexually Active: Not on file   Other Topics Concern  . Not on file   Social History Narrative  . No narrative on file    ROS: no fevers or chills, productive cough, hemoptysis, dysphasia, odynophagia, melena, hematochezia, dysuria, hematuria, rash, seizure activity, orthopnea, PND, pedal edema, claudication. Remaining systems are negative.  Physical Exam: Well-developed obese in no acute distress.  Skin is warm and dry.  HEENT is normal.  Neck is supple.  Chest is clear to auscultation with normal expansion.  Cardiovascular exam is regular rate and rhythm.  Abdominal exam nontender or distended. No masses  palpated. Extremities show no edema. neuro grossly intact  ECG sinus bradycardia at a rate of 54. Left axis deviation. No ST changes.

## 2012-03-17 NOTE — Assessment & Plan Note (Signed)
Continue aspirin and statin.plan functional study when she returns in one year.

## 2012-03-17 NOTE — Patient Instructions (Addendum)
Your physician wants you to follow-up in: ONE YEAR WITH DR CRENSHAW You will receive a reminder letter in the mail two months in advance. If you don't receive a letter, please call our office to schedule the follow-up appointment.  

## 2012-04-15 ENCOUNTER — Ambulatory Visit (INDEPENDENT_AMBULATORY_CARE_PROVIDER_SITE_OTHER): Payer: Medicare Other | Admitting: General Surgery

## 2012-04-18 ENCOUNTER — Telehealth (INDEPENDENT_AMBULATORY_CARE_PROVIDER_SITE_OTHER): Payer: Self-pay | Admitting: General Surgery

## 2012-04-18 ENCOUNTER — Encounter (INDEPENDENT_AMBULATORY_CARE_PROVIDER_SITE_OTHER): Payer: Self-pay | Admitting: General Surgery

## 2012-04-18 ENCOUNTER — Ambulatory Visit (INDEPENDENT_AMBULATORY_CARE_PROVIDER_SITE_OTHER): Payer: Medicare Other | Admitting: General Surgery

## 2012-04-18 VITALS — BP 120/74 | HR 63 | Temp 97.8°F | Resp 18 | Ht 63.03 in | Wt 209.2 lb

## 2012-04-18 DIAGNOSIS — D0511 Intraductal carcinoma in situ of right breast: Secondary | ICD-10-CM

## 2012-04-18 DIAGNOSIS — D0590 Unspecified type of carcinoma in situ of unspecified breast: Secondary | ICD-10-CM

## 2012-04-18 NOTE — Telephone Encounter (Signed)
Spoke with this patient she is [facility] . She is waiting for [facility] to call her and give her date on MR Bi lat breast

## 2012-04-18 NOTE — Patient Instructions (Signed)
Continue regular self exams Continue arimidex 

## 2012-04-19 ENCOUNTER — Encounter (INDEPENDENT_AMBULATORY_CARE_PROVIDER_SITE_OTHER): Payer: Self-pay | Admitting: General Surgery

## 2012-04-19 NOTE — Progress Notes (Signed)
Subjective:     Patient ID: Carmen Haney, female   DOB: January 03, 1931, 79 y.o.   MRN: [id number]  HPI The patient is a 79 year old white female who is 3-1/2 years status post right lumpectomy for DCIS. She is taking anastrozole. She complains of a lot of arthritis-type pains in her joints. Her only other complaint is of some soreness of the right axilla.  Review of Systems  Constitutional: Negative.   HENT: Negative.   Eyes: Negative.   Respiratory: Negative.   Cardiovascular: Negative.   Gastrointestinal: Negative.   Endocrine: Negative.   Genitourinary: Negative.   Musculoskeletal: Positive for arthralgias.  Skin: Negative.   Allergic/Immunologic: Negative.   Neurological: Negative.   Hematological: Negative.   Psychiatric/Behavioral: Negative.        Objective:   Physical Exam  Constitutional: She is oriented to person, place, and time. She appears well-developed and well-nourished.  HENT:  Head: Normocephalic and atraumatic.  Eyes: Conjunctivae and EOM are normal. Pupils are equal, round, and reactive to light.  Neck: Normal range of motion. Neck supple.  Cardiovascular: Normal rate, regular rhythm and normal heart sounds.   Pulmonary/Chest: Effort normal and breath sounds normal.  There is no palpable mass in either breast. There is no palpable axillary or supraclavicular cervical lymphadenopathy  Abdominal: Soft. Bowel sounds are normal. She exhibits no mass. There is no tenderness.  Musculoskeletal: Normal range of motion.  Lymphadenopathy:    She has no cervical adenopathy.  Neurological: She is alert and oriented to person, place, and time.  Skin: Skin is warm and dry.  Psychiatric: She has a normal mood and affect. Her behavior is normal.       Assessment:     The patient is 3-1/2 years status post right lumpectomy for DCIS     Plan:     At this point she will continue to do regular self exams. She will continue to take anastrozole. Since her original cancer  was not appreciated on mammogram or ultrasound I think it would be reasonable to get an every other year MRI for her. We will order this to be done after her upcoming mammogram

## 2012-04-25 ENCOUNTER — Ambulatory Visit (INDEPENDENT_AMBULATORY_CARE_PROVIDER_SITE_OTHER): Payer: Medicare Other | Admitting: General Surgery

## 2012-04-27 ENCOUNTER — Ambulatory Visit
Admission: RE | Admit: 2012-04-27 | Discharge: 2012-04-27 | Disposition: A | Discharge status: Home / Self Care | Payer: Medicare Other | Source: Ambulatory Visit | Attending: General Surgery | Admitting: General Surgery

## 2012-04-27 DIAGNOSIS — D0511 Intraductal carcinoma in situ of right breast: Secondary | ICD-10-CM

## 2012-05-20 ENCOUNTER — Ambulatory Visit
Admission: RE | Admit: 2012-05-20 | Discharge: 2012-05-20 | Disposition: A | Discharge status: Home / Self Care | Payer: Medicare Other | Source: Ambulatory Visit | Attending: General Surgery | Admitting: General Surgery

## 2012-05-20 DIAGNOSIS — D0511 Intraductal carcinoma in situ of right breast: Secondary | ICD-10-CM

## 2012-05-20 MED ORDER — GADOBENATE DIMEGLUMINE 529 MG/ML IV SOLN
19.0000 mL | Freq: Once | INTRAVENOUS | Status: AC | PRN
  Administered 2012-05-20: 19 mL via INTRAVENOUS

## 2012-06-02 ENCOUNTER — Telehealth (INDEPENDENT_AMBULATORY_CARE_PROVIDER_SITE_OTHER): Payer: Self-pay

## 2012-06-02 NOTE — Telephone Encounter (Signed)
Pt called for MRI result. Pt advised of MRI results and copy mailed to pt per pt request.

## 2012-06-04 ENCOUNTER — Other Ambulatory Visit: Payer: Self-pay | Admitting: Cardiology

## 2012-07-09 ENCOUNTER — Other Ambulatory Visit: Payer: Self-pay | Admitting: Cardiology

## 2012-09-05 ENCOUNTER — Other Ambulatory Visit: Payer: Self-pay | Admitting: Cardiology

## 2012-10-16 ENCOUNTER — Other Ambulatory Visit: Payer: Self-pay | Admitting: Cardiology

## 2012-10-19 ENCOUNTER — Other Ambulatory Visit: Payer: Self-pay | Admitting: Physician Assistant

## 2012-10-19 DIAGNOSIS — D0510 Intraductal carcinoma in situ of unspecified breast: Secondary | ICD-10-CM

## 2012-10-20 ENCOUNTER — Ambulatory Visit (HOSPITAL_BASED_OUTPATIENT_CLINIC_OR_DEPARTMENT_OTHER): Payer: Medicare Other | Admitting: Oncology

## 2012-10-20 ENCOUNTER — Other Ambulatory Visit (HOSPITAL_BASED_OUTPATIENT_CLINIC_OR_DEPARTMENT_OTHER): Payer: Medicare Other | Admitting: Lab

## 2012-10-20 ENCOUNTER — Telehealth: Payer: Self-pay | Admitting: *Deleted

## 2012-10-20 VITALS — BP 127/73 | HR 59 | Temp 98.3°F | Resp 19 | Ht 63.0 in | Wt 208.5 lb

## 2012-10-20 DIAGNOSIS — D0590 Unspecified type of carcinoma in situ of unspecified breast: Secondary | ICD-10-CM

## 2012-10-20 DIAGNOSIS — Z23 Encounter for immunization: Secondary | ICD-10-CM

## 2012-10-20 DIAGNOSIS — C50919 Malignant neoplasm of unspecified site of unspecified female breast: Secondary | ICD-10-CM

## 2012-10-20 DIAGNOSIS — D0510 Intraductal carcinoma in situ of unspecified breast: Secondary | ICD-10-CM

## 2012-10-20 DIAGNOSIS — D0511 Intraductal carcinoma in situ of right breast: Secondary | ICD-10-CM | POA: Insufficient documentation

## 2012-10-20 LAB — COMPREHENSIVE METABOLIC PANEL (CC13)
Alkaline Phosphatase: 69 U/L (ref 40–150)
Anion Gap: 6 mEq/L (ref 3–11)
BUN: 15.6 mg/dL (ref 7.0–26.0)
Glucose: 99 mg/dl (ref 70–140)
Total Bilirubin: 0.74 mg/dL (ref 0.20–1.20)

## 2012-10-20 LAB — CBC WITH DIFFERENTIAL/PLATELET
Basophils Absolute: 0.1 10*3/uL (ref 0.0–0.1)
Eosinophils Absolute: 0.1 10*3/uL (ref 0.0–0.5)
HCT: 41.6 % (ref 34.8–46.6)
HGB: 14.1 g/dL (ref 11.6–15.9)
LYMPH%: 23.2 % (ref 14.0–49.7)
MCV: 92 fL (ref 79.5–101.0)
MONO%: 11.6 % (ref 0.0–14.0)
NEUT#: 4.3 10*3/uL (ref 1.5–6.5)
NEUT%: 62.5 % (ref 38.4–76.8)
Platelets: 249 10*3/uL (ref 145–400)

## 2012-10-20 MED ORDER — INFLUENZA VAC SPLIT QUAD 0.5 ML IM SUSP
0.5000 mL | INTRAMUSCULAR | Status: AC
  Administered 2012-10-20: 0.5 mL via INTRAMUSCULAR
  Filled 2012-10-20: qty 0.5

## 2012-10-20 MED ORDER — ANASTROZOLE 1 MG PO TABS: 1.0000 mg | ORAL_TABLET | Freq: Every day | ORAL | Status: DC

## 2012-10-20 NOTE — Telephone Encounter (Signed)
appts made and printed...td 

## 2012-10-20 NOTE — Progress Notes (Signed)
ID: Carmen Haney   DOB: 01-11-1931  MR#: [id number]  ONG#:[id number]  PCP: Astrid Divine, MD GYN: SU:  OTHER MD:   HISTORY OF PRESENT ILLNESS: Carmen Haney has a significant family history of breast cancer and has been followed closely through Dr. Cherlyn Labella office.  She had screening diagnostic mammography 12/27/2007, which only showed scattered parenchymal density.  Even though the mammogram was negative because of the family history, Dr. Yolanda Bonine felt an MRI should be obtained for screening and this was done on 03/01/2008.  It showed an asymmetric area of enhancement in the upper inner quadrant of the right breast measuring up to 1.3 cm; an incidental hepatic cyst was also noted.  With this information, ultrasound of the right breast was obtained but this failed to show the abnormality in question, so the patient underwent MRI-guided biopsy of the right breast area of abnormality on 03/26/2008.  The final pathology ([id number] and [id number]) showed a low-grade ductal carcinoma in situ with no evidence of an invasive component, 100% ER and 100% PR positive.    With this information, the patient was referred to Dr. Carolynne Edouard, and after appropriate discussion on 04/24/2008, she underwent needle localization lumpectomy with the final pathology [id number]) showing a grade 1 ductal carcinoma in situ measuring 8 mm with negative margins, the closest being 2 mm and the specimen being intact. The patient underwent radiation therapy which was completed in June of 2010. She has been on anastrozole since that time. Her subsequent history as detailed below.  INTERVAL HISTORY: Carmen Haney returns today accompanied by her husband, Carmen Haney, for routine  followup of her noninvasive breast cancer. The interval history is generally unremarkable. She is not exercising, aside from her usual household activities. She is tolerating the anastrozole without any problems with hot flashes, vaginal dryness, or worsening  arthralgias/myalgias  REVIEW OF SYSTEMS: Describes herself is moderately fatigued. She has some hearing loss. She needs reading glasses. She is short of breath when walking up stairs. She does get to the top of the stairs though without stopping. She has some joint pain particularly in her knees and hips. She feels moderately anxious. Her hands sometimes feel numb, particularly in the morning. They get better during the day. None of these symptoms are new or more intense or persistent than prior. None of them in my opinion are related to her breast cancer or its treatment.  PAST MEDICAL HISTORY: Past Medical History  Diagnosis Date  . CAD (coronary artery disease) 07/1998    s/p cath in 2000. L main with 30 to 40%  . Obesities, morbid   . HTN (hypertension)   . Breast CA     s/p lumpectomy & radiation  . Hyperlipidemia   . ACE-inhibitor cough   . Thyroid disease   . Osteoporosis   . Hearing loss     PAST SURGICAL HISTORY: Past Surgical History  Procedure Laterality Date  . Breast lumpectomy  March 2010  . Cesarean section    . Total abdominal hysterectomy      FAMILY HISTORY Family History  Problem Relation Age of Onset  . Coronary artery disease Mother   . Hypertension Mother   . Breast cancer Mother   . Melanoma Mother   . Prostate cancer Father 19   GYNECOLOGIC HISTORY:  She is GX P2, first pregnancy to term age 64.  The patient does not know when she went through menopause since she had a hysterectomy.  She never took hormones.  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  Her husband, Carmen Haney, used to work for [employer] in Firefighter, later taught math, Environmental manager, and computers in the local school.  They are both retired.  Their daughter, Carmen Haney,  does childcare at her church.  [name] works in occupational therapy.  The patient has 2 grandchildren.  The patient is not a church attender.  She previously attended [employer].    ADVANCED DIRECTIVES: not in place  HEALTH MAINTENANCE: History  Substance Use Topics  . Smoking status: Former Smoker    Types: Cigarettes    Quit date: 01/13/1963  . Smokeless tobacco: Never Used     Comment: stopped in 1970's  . Alcohol Use: No     Colonoscopy:  PAP:  Bone density: SEPT 2012, T -1.7  Lipid panel:  Allergies  Allergen Reactions  . Ace Inhibitors Cough  . Latex     Current Outpatient Prescriptions  Medication Sig Dispense Refill  . alendronate (FOSAMAX) 70 MG tablet Take 70 mg by mouth every 7 (seven) days. Take with a full glass of water on an empty stomach.       Marland Kitchen anastrozole (ARIMIDEX) 1 MG tablet TAKE 1 TABLET BY MOUTH DAILY  90 tablet  3  . aspirin 81 MG tablet Take 81 mg by mouth daily.        Marland Kitchen atenolol (TENORMIN) 25 MG tablet TAKE 1 TABLET (25 MG TOTAL) BY MOUTH DAILY.  90 tablet  3  . atenolol (TENORMIN) 25 MG tablet TAKE 1 TABLET (25 MG TOTAL) BY MOUTH DAILY.  90 tablet  4  . Calcium Carbonate-Vitamin D (CALCIUM + D PO) Take by mouth 2 (two) times daily.        . furosemide (LASIX) 20 MG tablet TAKE 1 TABLET (20 MG TOTAL) BY MOUTH DAILY.  90 tablet  0  . furosemide (LASIX) 20 MG tablet TAKE 1 TABLET BY MOUTH DAILY  90 tablet  2  . levothyroxine (SYNTHROID, LEVOTHROID) 75 MCG tablet Take 75 mcg by mouth daily.        Marland Kitchen losartan (COZAAR) 50 MG tablet TAKE 1 TABLET (50 MG TOTAL) BY MOUTH DAILY.  90 tablet  3  . simvastatin (ZOCOR) 20 MG tablet Take 0.5 tablets (10 mg total) by mouth at bedtime.  30 tablet  2  . [DISCONTINUED] olmesartan (BENICAR) 20 MG tablet Take 20 mg by mouth daily.         No current facility-administered medications for this visit.    OBJECTIVE: Elderly white woman who looks stated age 79 Vitals:   10/20/12 1107  BP: 127/73  Pulse: 59  Temp: 98.3 F (36.8 C)  Resp: 19     Body mass index is 36.94 kg/(m^2).    ECOG FS: 1  Sclerae unicteric, pupils equal round and reactive to light Oropharynx no  thrush or other lesions No cervical or supraclavicular adenopathy Lungs no rales or rhonchi Heart regular rate and rhythm Abd soft, nontender, positive bowel sounds MSK no focal spinal tenderness, no upper extremity lymphedema,  Neuro: nonfocal, well oriented, anxious affect Breasts: The right breast is status post lumpectomy. There is no evidence of local recurrence. The right axilla is benign.The left breast exam was unremarkable.   LAB RESULTS: Lab Results  Component Value Date   WBC 6.9 10/20/2012   NEUTROABS 4.3 10/20/2012   HGB 14.1 10/20/2012   HCT 41.6 10/20/2012   MCV 92.0 10/20/2012   PLT 249 10/20/2012  Chemistry      Component Value Date/Time   NA 137 10/20/2012 1052   NA 140 04/03/2011 1207   K 4.4 10/20/2012 1052   K 4.1 04/03/2011 1207   CL 101 10/15/2011 1123   CL 103 04/03/2011 1207   CO2 31* 10/20/2012 1052   CO2 29 04/03/2011 1207   BUN 15.6 10/20/2012 1052   BUN 16 04/03/2011 1207   CREATININE 1.0 10/20/2012 1052   CREATININE 0.97 04/03/2011 1207      Component Value Date/Time   CALCIUM 9.4 10/20/2012 1052   CALCIUM 9.0 04/03/2011 1207   ALKPHOS 69 10/20/2012 1052   ALKPHOS 69 04/03/2011 1207   AST 16 10/20/2012 1052   AST 17 04/03/2011 1207   ALT 15 10/20/2012 1052   ALT 15 04/03/2011 1207   BILITOT 0.74 10/20/2012 1052   BILITOT 0.5 04/03/2011 1207       STUDIES: Mammography 04/27/2012 and breast MRI 05/20/2012 were benign  ASSESSMENT: 79 y.o.  [place] woman   (1)  status post right lumpectomy April 2010 for an 8-mm ductal carcinoma in situ, low grade, strongly estrogen and progesterone receptors positive, with negative margins,   (2)  status post radiation completed June 2010,   (3)  on anastrozole since June 2010, the plan being to continue anastrozole for a total of 5 years.  PLAN: Lailie is now 4-1/2 years out from her definitive surgery, with no evidence of disease recurrence. She has an excellent prognosis. She will see [place] again in 6 months. We  will obtain a repeat bone density before that visit. Likely she will "graduate" at that time. She knows to call for any problems that may develop before then.  Corliss Coggeshall C    10/20/2012

## 2012-10-24 ENCOUNTER — Telehealth: Payer: Self-pay | Admitting: *Deleted

## 2012-10-24 NOTE — Telephone Encounter (Signed)
sw pt gv appt for [facility]...td

## 2012-10-25 ENCOUNTER — Ambulatory Visit (INDEPENDENT_AMBULATORY_CARE_PROVIDER_SITE_OTHER): Payer: 59 | Admitting: General Surgery

## 2012-12-06 ENCOUNTER — Encounter (INDEPENDENT_AMBULATORY_CARE_PROVIDER_SITE_OTHER): Payer: Self-pay | Admitting: General Surgery

## 2012-12-06 ENCOUNTER — Ambulatory Visit (INDEPENDENT_AMBULATORY_CARE_PROVIDER_SITE_OTHER): Payer: Medicare Other | Admitting: General Surgery

## 2012-12-06 VITALS — BP 130/72 | HR 60 | Temp 97.6°F | Resp 14 | Ht 63.0 in | Wt 209.2 lb

## 2012-12-06 DIAGNOSIS — C50919 Malignant neoplasm of unspecified site of unspecified female breast: Secondary | ICD-10-CM

## 2012-12-06 DIAGNOSIS — D0511 Intraductal carcinoma in situ of right breast: Secondary | ICD-10-CM

## 2012-12-06 NOTE — Patient Instructions (Signed)
Continue regular self exams Continue anastrazole 

## 2012-12-07 NOTE — Progress Notes (Signed)
Subjective:     Patient ID: Carmen Haney, female   DOB: 10-Dec-1930, 79 y.o.   MRN: [id number]  HPI The patient is an 79 year old white female who is 4 years status post right lumpectomy for DCIS. She is taken anastrozole and tolerating that well. She continues to have some soreness in her right breast and chest wall as well as some joint aches but this is all stable.  Review of Systems  Constitutional: Negative.   HENT: Negative.   Eyes: Negative.   Respiratory: Negative.   Cardiovascular: Negative.   Gastrointestinal: Negative.   Endocrine: Negative.   Genitourinary: Negative.   Musculoskeletal: Positive for arthralgias.  Skin: Negative.   Allergic/Immunologic: Negative.   Neurological: Negative.   Hematological: Negative.   Psychiatric/Behavioral: Negative.        Objective:   Physical Exam  Constitutional: She is oriented to person, place, and time. She appears well-developed and well-nourished.  HENT:  Head: Normocephalic and atraumatic.  Eyes: Conjunctivae and EOM are normal. Pupils are equal, round, and reactive to light.  Neck: Normal range of motion. Neck supple.  Cardiovascular: Normal rate, regular rhythm and normal heart sounds.   Pulmonary/Chest: Effort normal and breath sounds normal.  Her right breast incision is healed nicely. There is no palpable mass in either breast. There is no palpable axillary, supraclavicular, or cervical lymphadenopathy  Abdominal: Soft. Bowel sounds are normal. She exhibits no mass. There is no tenderness.  Musculoskeletal: Normal range of motion.  Lymphadenopathy:    She has no cervical adenopathy.  Neurological: She is alert and oriented to person, place, and time.  Skin: Skin is warm and dry.  Psychiatric: She has a normal mood and affect. Her behavior is normal.       Assessment:     The patient is 4 years status post right breast lumpectomy for DCIS     Plan:     At this point she will continue to take anastrozole. She  will continue to do regular self exams. I will plan to see her back in about 6 months.

## 2013-05-03 ENCOUNTER — Other Ambulatory Visit: Payer: Self-pay | Admitting: Family Medicine

## 2013-05-03 ENCOUNTER — Other Ambulatory Visit: Payer: Self-pay | Admitting: Oncology

## 2013-05-03 DIAGNOSIS — Z853 Personal history of malignant neoplasm of breast: Secondary | ICD-10-CM

## 2013-05-12 ENCOUNTER — Encounter (INDEPENDENT_AMBULATORY_CARE_PROVIDER_SITE_OTHER): Payer: Self-pay | Admitting: General Surgery

## 2013-05-19 ENCOUNTER — Ambulatory Visit
Admission: RE | Admit: 2013-05-19 | Discharge: 2013-05-19 | Disposition: A | Discharge status: Home / Self Care | Payer: Medicare Other | Source: Ambulatory Visit | Attending: Oncology | Admitting: Oncology

## 2013-05-19 DIAGNOSIS — Z853 Personal history of malignant neoplasm of breast: Secondary | ICD-10-CM

## 2013-06-08 ENCOUNTER — Telehealth: Payer: Self-pay

## 2013-06-08 MED ORDER — FUROSEMIDE 20 MG PO TABS
ORAL_TABLET | ORAL | Status: DC
Start: 2013-06-08 — End: 2013-07-10

## 2013-06-08 NOTE — Telephone Encounter (Signed)
refill 

## 2013-06-27 ENCOUNTER — Ambulatory Visit (HOSPITAL_BASED_OUTPATIENT_CLINIC_OR_DEPARTMENT_OTHER): Payer: Medicare Other | Admitting: Oncology

## 2013-06-27 ENCOUNTER — Other Ambulatory Visit (HOSPITAL_BASED_OUTPATIENT_CLINIC_OR_DEPARTMENT_OTHER): Payer: Medicare Other

## 2013-06-27 ENCOUNTER — Telehealth: Payer: Self-pay | Admitting: Oncology

## 2013-06-27 VITALS — BP 140/84 | HR 54 | Temp 98.3°F | Resp 20 | Ht 63.0 in | Wt 212.2 lb

## 2013-06-27 DIAGNOSIS — D0590 Unspecified type of carcinoma in situ of unspecified breast: Secondary | ICD-10-CM

## 2013-06-27 DIAGNOSIS — D0511 Intraductal carcinoma in situ of right breast: Secondary | ICD-10-CM

## 2013-06-27 DIAGNOSIS — C50919 Malignant neoplasm of unspecified site of unspecified female breast: Secondary | ICD-10-CM

## 2013-06-27 DIAGNOSIS — Z170 Estrogen receptor positive status [ER+]: Secondary | ICD-10-CM

## 2013-06-27 DIAGNOSIS — C9000 Multiple myeloma not having achieved remission: Secondary | ICD-10-CM

## 2013-06-27 LAB — COMPREHENSIVE METABOLIC PANEL (CC13)
ALT: 20 U/L (ref 0–55)
AST: 17 U/L (ref 5–34)
Albumin: 3.9 g/dL (ref 3.5–5.0)
Alkaline Phosphatase: 67 U/L (ref 40–150)
Anion Gap: 9 mEq/L (ref 3–11)
BILIRUBIN TOTAL: 0.68 mg/dL (ref 0.20–1.20)
BUN: 14.1 mg/dL (ref 7.0–26.0)
CO2: 29 mEq/L (ref 22–29)
Calcium: 9.3 mg/dL (ref 8.4–10.4)
Chloride: 100 mEq/L (ref 98–109)
Creatinine: 1 mg/dL (ref 0.6–1.1)
Glucose: 120 mg/dl (ref 70–140)
Potassium: 4.5 mEq/L (ref 3.5–5.1)
Sodium: 138 mEq/L (ref 136–145)
Total Protein: 7.2 g/dL (ref 6.4–8.3)

## 2013-06-27 LAB — CBC WITH DIFFERENTIAL/PLATELET
BASO%: 1 % (ref 0.0–2.0)
Basophils Absolute: 0.1 10*3/uL (ref 0.0–0.1)
EOS ABS: 0.1 10*3/uL (ref 0.0–0.5)
EOS%: 2.3 % (ref 0.0–7.0)
HEMATOCRIT: 43.1 % (ref 34.8–46.6)
HGB: 14.2 g/dL (ref 11.6–15.9)
LYMPH%: 21.3 % (ref 14.0–49.7)
MCH: 31.2 pg (ref 25.1–34.0)
MCHC: 33.1 g/dL (ref 31.5–36.0)
MCV: 94.3 fL (ref 79.5–101.0)
MONO#: 0.6 10*3/uL (ref 0.1–0.9)
MONO%: 9.5 % (ref 0.0–14.0)
NEUT#: 4 10*3/uL (ref 1.5–6.5)
NEUT%: 65.9 % (ref 38.4–76.8)
Platelets: 252 10*3/uL (ref 145–400)
RBC: 4.57 10*6/uL (ref 3.70–5.45)
RDW: 13.2 % (ref 11.2–14.5)
WBC: 6 10*3/uL (ref 3.9–10.3)
lymph#: 1.3 10*3/uL (ref 0.9–3.3)

## 2013-06-27 NOTE — Progress Notes (Signed)
ID: Carmen Haney   DOB: [birth year]  MR#: [id number]  CSN#:[id number]  PCP: Osborne Casco, MD GYN: SU:  OTHER MD:   HISTORY OF PRESENT ILLNESS: From the original intake note:  Carmen Haney has a significant family history of breast cancer and has been followed closely through Dr. Gareth Eagle office.  She had screening diagnostic mammography 12/27/2007, which only showed scattered parenchymal density.  Even though the mammogram was negative because of the family history, Dr. Isaiah Blakes felt an MRI should be obtained for screening and this was done on 03/01/2008.  It showed an asymmetric area of enhancement in the upper inner quadrant of the right breast measuring up to 1.3 cm; an incidental hepatic cyst was also noted.  With this information, ultrasound of the right breast was obtained but this failed to show the abnormality in question, so the patient underwent MRI-guided biopsy of the right breast area of abnormality on 03/26/2008.  The final pathology ([id number]) showed a low-grade ductal carcinoma in situ with no evidence of an invasive component, 100% ER and 100% PR positive.    With this information, the patient was referred to Dr. Marlou Starks, and after appropriate discussion on 04/24/2008, she underwent needle localization lumpectomy with the final pathology [id number]) showing a grade 1 ductal carcinoma in situ measuring 8 mm with negative margins, the closest being 2 mm and the specimen being intact. The patient underwent radiation therapy which was completed in June of 2010. She has been on anastrozole since that time.   Her subsequent history as detailed below.  INTERVAL HISTORY: Carmen Haney returns today accompanied by her husband, Tressie Ellis, for  followup of her noninvasive breast cancer. The interval history is unremarkable. She continues to take anastrozole with no side effects that she is aware of.  REVIEW OF SYSTEMS: That he is becoming less and less mobile. She has significant  sleep problems, staying up late, sleeping in our 2, waking up on reading, sleeping a few more hours, waking up later. She does not like to leave the house. She is concerned that Tressie Ellis has a defibrillator in place and he is the driver. She had a lens placed in one of her eyes since he is seeing much better. She feels short of breath when walking short distances. Sometimes she feels now made her hands or arms. She feels anxious but denies depression. A detailed review of systems was otherwise noncontributory  PAST MEDICAL HISTORY: Past Medical History  Diagnosis Date  . CAD (coronary artery disease) 07/1998    s/p cath in 2000. L main with 30 to 40%  . Obesities, morbid   . HTN (hypertension)   . Breast CA     s/p lumpectomy & radiation  . Hyperlipidemia   . ACE-inhibitor cough   . Thyroid disease   . Osteoporosis   . Hearing loss     PAST SURGICAL HISTORY: Past Surgical History  Procedure Laterality Date  . Breast lumpectomy  March 2010  . Cesarean section    . Total abdominal hysterectomy      FAMILY HISTORY Family History  Problem Relation Age of Onset  . Coronary artery disease Mother   . Hypertension Mother   . Breast cancer Mother   . Melanoma Mother   . Prostate cancer Father 55   GYNECOLOGIC HISTORY:  She is GX P2, first pregnancy to term age 24.  The patient does not know when she went through menopause since she had a hysterectomy.  She never took hormones.  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  Her husband, Nicole Kindred, used to work for [employer] in Librarian, academic, later taught math, Probation officer, and computers in the local school.  They are both retired.  Their daughter, [name],  does childcare at her church.  Daughter Hildred Alamin works in occupational therapy.  The patient has 2 grandchildren.  The patient is not a church attender.  She previously attended [employer].    ADVANCED DIRECTIVES: not in place  HEALTH  MAINTENANCE: History  Substance Use Topics  . Smoking status: Former Smoker    Types: Cigarettes    Quit date: 01/13/1963  . Smokeless tobacco: Never Used     Comment: stopped in 1970's  . Alcohol Use: No     Colonoscopy:  PAP:  Bone density: SEPT 2012, T -1.7  Lipid panel:  Allergies  Allergen Reactions  . Ace Inhibitors Cough  . Latex     Current Outpatient Prescriptions  Medication Sig Dispense Refill  . alendronate (FOSAMAX) 70 MG tablet Take 70 mg by mouth every 7 (seven) days. Take with a full glass of water on an empty stomach.       Marland Kitchen anastrozole (ARIMIDEX) 1 MG tablet Take 1 tablet (1 mg total) by mouth daily.  90 tablet  3  . aspirin 81 MG tablet Take 81 mg by mouth daily.        Marland Kitchen atenolol (TENORMIN) 25 MG tablet TAKE 1 TABLET (25 MG TOTAL) BY MOUTH DAILY.  90 tablet  3  . Calcium Carbonate-Vitamin D (CALCIUM + D PO) Take by mouth 2 (two) times daily.        . furosemide (LASIX) 20 MG tablet TAKE 1 TABLET (20 MG TOTAL) BY MOUTH DAILY.  30 tablet  0  . levothyroxine (SYNTHROID, LEVOTHROID) 75 MCG tablet Take 75 mcg by mouth daily.        Marland Kitchen losartan (COZAAR) 50 MG tablet TAKE 1 TABLET (50 MG TOTAL) BY MOUTH DAILY.  90 tablet  3  . simvastatin (ZOCOR) 20 MG tablet Take 0.5 tablets (10 mg total) by mouth at bedtime.  30 tablet  2  . [DISCONTINUED] olmesartan (BENICAR) 20 MG tablet Take 20 mg by mouth daily.         No current facility-administered medications for this visit.    OBJECTIVE: Elderly white woman in no acute distress Filed Vitals:   06/27/13 1047  BP: 140/84  Pulse: 54  Temp: 98.3 F (36.8 C)  Resp: 20     Body mass index is 37.6 kg/(m^2).    ECOG FS: 2  Sclerae unicteric, EOMs intact Oropharynx clear and moist No cervical or supraclavicular adenopathy Lungs no rales or rhonchi Heart regular rate and rhythm Abd soft, nontender, positive bowel sounds MSK no focal spinal tenderness, no upper extremity lymphedema,  Neuro: nonfocal, well oriented,  appropriate affect Breasts: The right breast is status post lumpectomy. There is no evidence of local recurrence. The right axilla is benign.The left breast is unremarkable.   LAB RESULTS: Lab Results  Component Value Date   WBC 6.0 06/27/2013   NEUTROABS 4.0 06/27/2013   HGB 14.2 06/27/2013   HCT 43.1 06/27/2013   MCV 94.3 06/27/2013   PLT 252 06/27/2013      Chemistry      Component Value Date/Time   NA 138 06/27/2013 1037   NA 140 04/03/2011 1207   K 4.5 06/27/2013 1037   K 4.1 04/03/2011 1207   CL  101 10/15/2011 1123   CL 103 04/03/2011 1207   CO2 29 06/27/2013 1037   CO2 29 04/03/2011 1207   BUN 14.1 06/27/2013 1037   BUN 16 04/03/2011 1207   CREATININE 1.0 06/27/2013 1037   CREATININE 0.97 04/03/2011 1207      Component Value Date/Time   CALCIUM 9.3 06/27/2013 1037   CALCIUM 9.0 04/03/2011 1207   ALKPHOS 67 06/27/2013 1037   ALKPHOS 69 04/03/2011 1207   AST 17 06/27/2013 1037   AST 17 04/03/2011 1207   ALT 20 06/27/2013 1037   ALT 15 04/03/2011 1207   BILITOT 0.68 06/27/2013 1037   BILITOT 0.5 04/03/2011 1207       STUDIES:  CLINICAL DATA: Malignant lumpectomy of the right breast in April,  2014, pathology revealing low-grade DCIS. Adjuvant radiation therapy  at that time. Annual evaluation.  EXAM:  DIGITAL DIAGNOSTIC BILATERAL MAMMOGRAM WITH CAD  COMPARISON: 04/27/2012, 03/25/2011, dating back to 03/26/2008.  ACR Breast Density Category b: There are scattered areas of  fibroglandular density.  FINDINGS:  CC and MLO views of both breasts and a spot tangential view of the  right breast at the lumpectomy site were obtained. Post lumpectomy  scarring in the right breast, unchanged. No findings suspicious for  malignancy in either breast.  Mammographic images were processed with CAD.  IMPRESSION:  No specific mammographic evidence of malignancy. Expected post  lumpectomy changes in the right breast.  RECOMMENDATION:  Bilateral diagnostic mammography in 1 year.  I have  discussed the findings and recommendations with the patient.  Results were also provided in writing at the conclusion of the  visit. If applicable, a reminder letter will be sent to the patient  regarding the next appointment.  BI-RADS CATEGORY 2: Benign.  Electronically Signed  By: Evangeline Dakin M.D.  On: 05/19/2013 12:22  Bone density at [facility] 11/04/2012 shows that T score in the right femoral neck of -1.7  ASSESSMENT: 80 y.o.  Wisdom woman   (1)  status post right lumpectomy April 2010 for an 8-mm ductal carcinoma in situ, low grade, strongly estrogen and progesterone receptors positive, with negative margins,   (2)  status post radiation completed June 2010,   (3)  on anastrozole between June of 2010 in June of 2015  PLAN: Demarie would normally be ready to graduate at this point, and I would be comfortable with that. However she is very concerned about a variety of questions which are indirectly related to her history of breast cancer and in brief she would like [place] to continue to see her on a yearly basis indefinitely.  I am going to move her appointments to November so she can receive her flu shot at the same time as the visit. She will go off the anastrozole. She will also stop the alendronate. She will continue on her other medications  She is "very happy staying at home". This is a problem because her husband Nicole Kindred likes to drive and go places. I think is fine for Cleopha to do what she wants at her age, but I did encourage her to increase her exercise tolerance otherwise she is going to become immobile and really will not be able to leave the house even when she wants to.  The patient has a good understanding of the overall plan. She agrees with it.  She will call with any problems that may develop before her next visit here.   MAGRINAT,GUSTAV C    06/27/2013

## 2013-06-27 NOTE — Telephone Encounter (Signed)
per pof to sch pt appt-gave pt copy of sch °

## 2013-06-28 ENCOUNTER — Telehealth: Payer: Self-pay | Admitting: Oncology

## 2013-06-28 ENCOUNTER — Telehealth: Payer: Self-pay | Admitting: *Deleted

## 2013-06-28 NOTE — Telephone Encounter (Signed)
Pt returned my call and I confirmed 07/27/13 genetic appt w/ pt.

## 2013-06-28 NOTE — Telephone Encounter (Signed)
per pof to sch pt IR-cld [facility] sch-gave pt time & date-pof to move [place] to July-r/s for 7/9-gave pt updated copy of sch

## 2013-06-28 NOTE — Telephone Encounter (Signed)
Pt called back stating that the appt conflicts with another appt.  Rescheduled and confirmed her for 07/28/13 genetic appt.

## 2013-07-10 ENCOUNTER — Other Ambulatory Visit: Payer: Self-pay | Admitting: Cardiology

## 2013-07-27 ENCOUNTER — Other Ambulatory Visit: Payer: Medicare Other

## 2013-07-28 ENCOUNTER — Other Ambulatory Visit: Payer: Medicare Other

## 2013-07-28 ENCOUNTER — Ambulatory Visit (HOSPITAL_BASED_OUTPATIENT_CLINIC_OR_DEPARTMENT_OTHER): Payer: Medicare Other | Admitting: Genetic Counselor

## 2013-07-28 DIAGNOSIS — D0511 Intraductal carcinoma in situ of right breast: Secondary | ICD-10-CM

## 2013-07-28 DIAGNOSIS — IMO0002 Reserved for concepts with insufficient information to code with codable children: Secondary | ICD-10-CM

## 2013-07-28 DIAGNOSIS — D0590 Unspecified type of carcinoma in situ of unspecified breast: Secondary | ICD-10-CM

## 2013-07-28 DIAGNOSIS — Z803 Family history of malignant neoplasm of breast: Secondary | ICD-10-CM | POA: Insufficient documentation

## 2013-07-28 DIAGNOSIS — D0510 Intraductal carcinoma in situ of unspecified breast: Secondary | ICD-10-CM | POA: Insufficient documentation

## 2013-07-28 DIAGNOSIS — Z808 Family history of malignant neoplasm of other organs or systems: Secondary | ICD-10-CM

## 2013-07-28 NOTE — Progress Notes (Signed)
HISTORY OF PRESENT ILLNESS: Dr. Laurann Montana requested a cancer genetics consultation for Carmen Haney, a 80 y.o. female, due to a personal and family history of cancer.  Carmen Haney presents to clinic today to discuss Carmen possibility of a hereditary predisposition to cancer, genetic testing, and to further clarify her future cancer risks, as well as potential cancer risk for family members. Carmen Haney was diagnosed with right breast DCIS (ER/PR+) at Carmen age of 27. She is s/p lumpectomy and radiation. She was diagnosed with a basal cell carcinoma on her right arm but has no history of other cancer.  Past Medical History  Diagnosis Date   CAD (coronary artery disease) 07/1998    s/p cath in 2000. L main with 30 to 40%   Obesities, morbid    HTN (hypertension)    Breast CA     s/p lumpectomy & radiation   Hyperlipidemia    ACE-inhibitor cough    Thyroid disease    Osteoporosis    Hearing loss     Past Surgical History  Procedure Laterality Date   Breast lumpectomy  March 2010   Cesarean section     Total abdominal hysterectomy      History   Social History   Marital Status: Married    Spouse Name: Tressie Ellis    Number of Children: N/A   Years of Education: N/A   Occupational History   Retired from [employer] History Main Topics   Smoking status: Former Smoker    Types: Cigarettes    Quit date: 01/13/1963   Smokeless tobacco: Never Used     Comment: stopped in 1970's   Alcohol Use: No   Drug Use: No   Sexual Activity: Not on file   Other Topics Concern   Not on file   Social History Narrative   No narrative on file     FAMILY HISTORY:  During Carmen visit, a 4-generation pedigree was obtained. Significant diagnoses include Carmen following:  Family History  Problem Relation Age of Onset   Coronary artery disease Mother    Hypertension Mother    Melanoma Mother    Cancer Mother 75    breast   Cancer Father 28    prostate   Cancer Sister 5     breast   Cancer Maternal Aunt 50    breast   Cancer Sister 48    breast    Carmen Haney ancestry is of Caucasian descent. There is no known Jewish ancestry or consanguinity.  GENETIC COUNSELING ASSESSMENT: Carmen Haney is a 80 y.o. female with a personal and family history of cancer consistent with a hereditary predisposition to cancer. We, therefore, discussed and recommended Carmen following at today's visit.   DISCUSSION: We reviewed Carmen characteristics, features and inheritance patterns of hereditary cancer syndromes. We also discussed genetic testing, including Carmen appropriate family members to test, Carmen process of testing, insurance coverage and turn-around-time for results. We discussed Carmen implications of a negative, positive and/or variant of uncertain significant result. We recommended Carmen Haney pursue genetic testing for Carmen BreastNext gene panel.   PLAN: Based on our above recommendation, Carmen Haney wished to pursue genetic testing and Carmen blood sample was drawn and will be sent to [facility] for analysis. Results should be available within approximately 5 weeks time, at which point they will be disclosed by telephone to Carmen Haney, as will any additional recommendations warranted by these results. We also encouraged Carmen Haney to remain  in contact with cancer genetics annually so that we can continuously update Carmen family history and inform her of any changes in cancer genetics and testing that may be of benefit for this family. Ms.  Haney questions were answered to her satisfaction today. Our contact information was provided should additional questions or concerns arise.   Thank you for Carmen referral and allowing [place] to share in Carmen care of your Haney.   Carmen Haney was seen for a total of 45 minutes in face-to-face genetic counseling.  This Haney was discussed with Magrinat who agrees with Carmen above.     _______________________________________________________________________ For Office Staff:  Number of people involved in session: 3 Was an Intern/ student involved with case: not applicable

## 2013-08-03 ENCOUNTER — Encounter: Payer: Self-pay | Admitting: *Deleted

## 2013-08-03 ENCOUNTER — Encounter: Payer: Self-pay | Admitting: Cardiology

## 2013-08-03 ENCOUNTER — Ambulatory Visit (INDEPENDENT_AMBULATORY_CARE_PROVIDER_SITE_OTHER): Payer: Medicare Other | Admitting: Cardiology

## 2013-08-03 VITALS — BP 118/74 | HR 60 | Ht 63.5 in | Wt 212.1 lb

## 2013-08-03 DIAGNOSIS — R0989 Other specified symptoms and signs involving the circulatory and respiratory systems: Secondary | ICD-10-CM

## 2013-08-03 DIAGNOSIS — I2510 Atherosclerotic heart disease of native coronary artery without angina pectoris: Secondary | ICD-10-CM

## 2013-08-03 MED ORDER — FUROSEMIDE 20 MG PO TABS
20.0000 mg | ORAL_TABLET | Freq: Every day | ORAL | Status: DC
Start: 2013-08-03 — End: 2014-05-07

## 2013-08-03 NOTE — Assessment & Plan Note (Signed)
Schedule carotid Dopplers. 

## 2013-08-03 NOTE — Assessment & Plan Note (Signed)
Blood pressure controlled. Continue present medications. 

## 2013-08-03 NOTE — Patient Instructions (Addendum)
Your physician wants you to follow-up in: [place] will receive a reminder letter in the mail two months in advance. If you don't receive a letter, please call our office to schedule the follow-up appointment.   Your physician has requested that you have a lexiscan myoview. For further information please visit [email or web address]. Please follow instruction sheet, as given.AT [place]   Your physician has requested that you have a carotid duplex. This test is an ultrasound of the carotid arteries in your neck. It looks at blood flow through these arteries that supply the brain with blood. Allow one hour for this exam. There are no restrictions or special instructions.

## 2013-08-03 NOTE — Assessment & Plan Note (Signed)
Continue statin. Lipids and liver monitored by primary care. 

## 2013-08-03 NOTE — Progress Notes (Signed)
      HPI: FU coronary disease. Patient had a cardiac catheterization in 2002 that showed a 40% left main. In April of 2012, she had a myoview that showed breast attenuation but no ischemia; EF 84. Since she was last seen in March of 2014 she does have some dyspnea on exertion but no orthopnea, PND, pedal edema, palpitations, syncope. She has had occasional epigastric pain unrelated to exertion. Not pleuritic, positional or related to food. She has not had exertional chest pain.   Current Outpatient Prescriptions  Medication Sig Dispense Refill  . aspirin 81 MG tablet Take 81 mg by mouth daily.        Marland Kitchen atenolol (TENORMIN) 25 MG tablet TAKE 1 TABLET (25 MG TOTAL) BY MOUTH DAILY.  90 tablet  3  . Calcium Carbonate-Vitamin D (CALCIUM + D PO) Take by mouth 2 (two) times daily.        . furosemide (LASIX) 20 MG tablet TAKE 1 TABLET (20 MG TOTAL) BY MOUTH DAILY.  30 tablet  0  . levothyroxine (SYNTHROID, LEVOTHROID) 75 MCG tablet Take 75 mcg by mouth daily.        Marland Kitchen losartan (COZAAR) 50 MG tablet TAKE 1 TABLET (50 MG TOTAL) BY MOUTH DAILY.  90 tablet  3  . simvastatin (ZOCOR) 10 MG tablet Take 10 mg by mouth daily.      . [DISCONTINUED] olmesartan (BENICAR) 20 MG tablet Take 20 mg by mouth daily.         No current facility-administered medications for this visit.     Past Medical History  Diagnosis Date  . CAD (coronary artery disease) 07/1998    s/p cath in 2000. L main with 30 to 40%  . Obesities, morbid   . HTN (hypertension)   . Breast CA     s/p lumpectomy & radiation  . Hyperlipidemia   . ACE-inhibitor cough   . Thyroid disease   . Osteoporosis   . Hearing loss     Past Surgical History  Procedure Laterality Date  . Breast lumpectomy  March 2010  . Cesarean section    . Total abdominal hysterectomy      History   Social History  . Marital Status: Married    Spouse Name: Tressie Ellis    Number of Children: N/A  . Years of Education: N/A   Occupational History  . Retired  from [employer]  . Smoking status: Former Smoker    Types: Cigarettes    Quit date: 01/13/1963  . Smokeless tobacco: Never Used     Comment: stopped in 1970's  . Alcohol Use: No  . Drug Use: No  . Sexual Activity: Not on file   Other Topics Concern  . Not on file   Social History Narrative  . No narrative on file    ROS: no fevers or chills, productive cough, hemoptysis, dysphasia, odynophagia, melena, hematochezia, dysuria, hematuria, rash, seizure activity, orthopnea, PND, pedal edema, claudication. Remaining systems are negative.  Physical Exam: Well-developed obese in no acute distress.  Skin is warm and dry.  HEENT is normal.  Neck is supple. Right carotid bruit Chest is clear to auscultation with normal expansion.  Cardiovascular exam is regular rate and rhythm.  Abdominal exam nontender or distended. No masses palpated. Extremities show no edema. neuro grossly intact  ECG Sinus rhythm at a rate of 60. Left anterior fascicular block. Occasional PVC. Nonspecific ST changes.

## 2013-08-03 NOTE — Assessment & Plan Note (Signed)
Continue aspirin and statin.She is having vague epigastric pain. Schedule nuclear study for risk stratification.

## 2013-08-08 ENCOUNTER — Ambulatory Visit (INDEPENDENT_AMBULATORY_CARE_PROVIDER_SITE_OTHER): Payer: Medicare Other | Admitting: General Surgery

## 2013-08-08 ENCOUNTER — Encounter (INDEPENDENT_AMBULATORY_CARE_PROVIDER_SITE_OTHER): Payer: Self-pay | Admitting: General Surgery

## 2013-08-08 ENCOUNTER — Telehealth: Payer: Self-pay | Admitting: Cardiology

## 2013-08-08 VITALS — BP 130/74 | HR 72 | Temp 97.0°F | Ht 63.0 in | Wt 210.0 lb

## 2013-08-08 DIAGNOSIS — D0511 Intraductal carcinoma in situ of right breast: Secondary | ICD-10-CM

## 2013-08-08 DIAGNOSIS — D0590 Unspecified type of carcinoma in situ of unspecified breast: Secondary | ICD-10-CM

## 2013-08-08 NOTE — Patient Instructions (Signed)
Continue regular self exams  

## 2013-08-08 NOTE — Telephone Encounter (Signed)
lmsg for pt to call..she has nuc and carotid scheduled for 8-11. Also left msg. on 7/23 and 7/24.

## 2013-08-09 NOTE — Progress Notes (Signed)
Subjective:     Patient ID: Carmen Haney, female   DOB: 03-04-1930, 80 y.o.   MRN: [id number]  HPI The patient is an 80 year old white female who is about 4-1/2 years status post right lumpectomy for DCIS. Since her last visit she has completed her anastrozole therapy and is no longer taking this medicine. Her main complaint is of some epigastric and substernal discomfort. She has seen a cardiologist and is planning to undergo a stress test in the very near future.  Review of Systems  Constitutional: Negative.   HENT: Negative.   Eyes: Negative.   Respiratory: Positive for shortness of breath.   Cardiovascular: Positive for chest pain.  Gastrointestinal: Negative.   Endocrine: Negative.   Genitourinary: Negative.   Musculoskeletal: Negative.   Skin: Negative.   Allergic/Immunologic: Negative.   Neurological: Negative.   Hematological: Negative.   Psychiatric/Behavioral: Negative.        Objective:   Physical Exam  Constitutional: She is oriented to person, place, and time. She appears well-developed and well-nourished.  HENT:  Head: Normocephalic and atraumatic.  Eyes: Conjunctivae and EOM are normal. Pupils are equal, round, and reactive to light.  Neck: Normal range of motion. Neck supple.  Cardiovascular: Normal rate, regular rhythm and normal heart sounds.   Pulmonary/Chest: Effort normal and breath sounds normal.  Her right breast incision has healed well. There is no palpable mass in either breast. There is no palpable axillary, supraclavicular, or cervical lymphadenopathy.  Abdominal: Soft. Bowel sounds are normal.  Musculoskeletal: Normal range of motion.  Lymphadenopathy:    She has no cervical adenopathy.  Neurological: She is alert and oriented to person, place, and time.  Skin: Skin is warm and dry.  Psychiatric: She has a normal mood and affect. Her behavior is normal.       Assessment:     The patient is 4-1/2 years status post right lumpectomy for DCIS     Plan:     At this point she has completed her antiestrogen therapy. She will continue to do regular self exams. I will plan to see her back in one year.

## 2013-08-22 ENCOUNTER — Ambulatory Visit (HOSPITAL_BASED_OUTPATIENT_CLINIC_OR_DEPARTMENT_OTHER): Payer: Medicare Other | Admitting: Cardiology

## 2013-08-22 ENCOUNTER — Ambulatory Visit (HOSPITAL_COMMUNITY): Payer: Medicare Other | Attending: Cardiology | Admitting: Radiology

## 2013-08-22 VITALS — BP 197/88 | HR 57 | Ht 63.0 in | Wt 207.0 lb

## 2013-08-22 DIAGNOSIS — Z87891 Personal history of nicotine dependence: Secondary | ICD-10-CM | POA: Insufficient documentation

## 2013-08-22 DIAGNOSIS — E785 Hyperlipidemia, unspecified: Secondary | ICD-10-CM | POA: Diagnosis not present

## 2013-08-22 DIAGNOSIS — R0989 Other specified symptoms and signs involving the circulatory and respiratory systems: Secondary | ICD-10-CM

## 2013-08-22 DIAGNOSIS — R0602 Shortness of breath: Secondary | ICD-10-CM

## 2013-08-22 DIAGNOSIS — I10 Essential (primary) hypertension: Secondary | ICD-10-CM | POA: Diagnosis not present

## 2013-08-22 DIAGNOSIS — I2510 Atherosclerotic heart disease of native coronary artery without angina pectoris: Secondary | ICD-10-CM

## 2013-08-22 DIAGNOSIS — R079 Chest pain, unspecified: Secondary | ICD-10-CM

## 2013-08-22 MED ORDER — TECHNETIUM TC 99M SESTAMIBI GENERIC - CARDIOLITE
30.0000 | Freq: Once | INTRAVENOUS | Status: AC | PRN
  Administered 2013-08-22: 30 via INTRAVENOUS

## 2013-08-22 MED ORDER — REGADENOSON 0.4 MG/5ML IV SOLN
0.4000 mg | Freq: Once | INTRAVENOUS | Status: AC
  Administered 2013-08-22: 0.4 mg via INTRAVENOUS

## 2013-08-22 MED ORDER — TECHNETIUM TC 99M SESTAMIBI GENERIC - CARDIOLITE
10.0000 | Freq: Once | INTRAVENOUS | Status: AC | PRN
  Administered 2013-08-22: 10 via INTRAVENOUS

## 2013-08-22 NOTE — Progress Notes (Signed)
[facility] [address] [place], [place] [address] [phone]    Cardiology Nuclear Med Study  Carmen Haney is a 80 y.o. female     MRN : [id number]     DOB: 18-Jan-1930  Procedure Date: 08/22/2013  Nuclear Med Background Indication for Stress Test:  Evaluation for Ischemia History:  CAD-CATH-2002  2012 MPI: EF: 84% NL Cardiac Risk Factors: History of Smoking, Hypertension, Lipids and TIA  Symptoms:  DOE   Nuclear Pre-Procedure Caffeine/Decaff Intake:  None NPO After: 9:00pm   Lungs:  clear O2 Sat: 96% on room air. IV 0.9% NS with Angio Cath:  22g  IV Site: R Antecubital  IV Started by:  Perrin Maltese, EMT-P  Chest Size (in):  44 Cup Size: C/D  Height: 5\' 3"  (1.6 m)  Weight:  207 lb (93.895 kg)  BMI:  Body mass index is 36.68 kg/(m^2). Tech Comments:  Rx this am except Lasix and CA    Nuclear Med Study 1 or 2 day study: 1 day  Stress Test Type:  Carlton Adam  Reading MD: n/a  Order Authorizing Provider:  B.Crenshaw MD  Resting Radionuclide: Technetium 59m Sestamibi  Resting Radionuclide Dose: 11.0 mCi   Stress Radionuclide:  Technetium 13m Sestamibi  Stress Radionuclide Dose: 33.0 mCi           Stress Protocol Rest HR: 57 Stress HR: 77  Rest BP: 197/88 Stress BP: 181/97  Exercise Time (min): n/a METS: n/a           Dose of Adenosine (mg):  n/a Dose of Lexiscan: 0.4 mg  Dose of Atropine (mg): n/a Dose of Dobutamine: n/a mcg/kg/min (at max HR)  Stress Test Technologist: Glade Lloyd, BS-ES  Nuclear Technologist:  Annye Rusk, CNMT     Rest Procedure:  Myocardial perfusion imaging was performed at rest 45 minutes following the intravenous administration of Technetium 58m Sestamibi. Rest ECG: NSR - Normal EKG  Stress Procedure:  The patient received IV Lexiscan 0.4 mg over 15-seconds.  Technetium 2m Sestamibi injected at 30-seconds.  Quantitative spect images were obtained after a 45 minute delay.  During the infusion of Lexiscan  the patient complained of SOB.  This resolved in recovery.  Stress ECG: No significant change from baseline ECG  QPS Raw Data Images:  Normal; no motion artifact; normal heart/lung ratio. Stress Images:  Small, mild apical anterior perfusion defect. Rest Images:  Small, mild apical anterior perfusion defect. Subtraction (SDS):  Small, mild fixed apical anterior perfusion defect. Transient Ischemic Dilatation (Normal <1.22):  1.12 Lung/Heart Ratio (Normal <0.45):  0.42  Quantitative Gated Spect Images QGS EDV:  66 ml QGS ESV:  24 ml  Impression Exercise Capacity:  Lexiscan with no exercise. BP Response:  Normal blood pressure response. Clinical Symptoms:  Dyspnea ECG Impression:  No significant ST segment change suggestive of ischemia. Comparison with Prior Nuclear Study: No images to compare  Overall Impression:  Low risk stress nuclear study with a small, mild fixed apical anterior perfusion defect.  Given normal wall motion, suspect attenuation.  No ischemia. .  LV Ejection Fraction: 64%.  LV Wall Motion:  NL LV Function; NL Wall Motion  [name] 08/22/2013

## 2013-08-22 NOTE — Progress Notes (Signed)
Carotid duplex performed 

## 2013-08-23 ENCOUNTER — Encounter: Payer: Self-pay | Admitting: Genetic Counselor

## 2013-08-23 DIAGNOSIS — Z803 Family history of malignant neoplasm of breast: Secondary | ICD-10-CM

## 2013-08-23 DIAGNOSIS — R0989 Other specified symptoms and signs involving the circulatory and respiratory systems: Secondary | ICD-10-CM

## 2013-08-23 DIAGNOSIS — D0511 Intraductal carcinoma in situ of right breast: Secondary | ICD-10-CM

## 2013-08-23 NOTE — Progress Notes (Signed)
HPI:  [name] was previously seen in the [facility] due to a personal and family history of cancer and concerns regarding a hereditary predisposition to cancer. Please refer to our prior cancer genetics clinic note for more information regarding [name]'s medical, social and family histories, and our assessment and recommendations, at the time. [name] recent genetic test results were disclosed to her, as were recommendations warranted by these results. These results and recommendations are discussed in more detail below.  GENETIC TEST RESULTS: At the time of [name]'s visit, we recommended she pursue genetic testing of the BreastNext gene panel. This test, which included sequencing and deletion/duplication analysis of the genes, was performed at [facility]. Genetic testing was normal, and did not reveal a deleterious mutation in these genes. A complete list of all genes tested is located on the test report scanned into EPIC.    We discussed with [name] that since the current genetic testing is not perfect, it is possible there may be a gene mutation in one of these genes that current testing cannot detect, but that chance is small.  We also discussed, that it is possible that another gene that has not yet been discovered, or that we have not yet tested, is responsible for the cancer diagnoses in the family, and it is, therefore, important to remain in touch with cancer genetics in the future so that we can continue to offer [name] the most up to date genetic testing.   CANCER SCREENING RECOMMENDATIONS: This result is reassuring and suggests that [name]'s cancer was most likely not due to an inherited predisposition associated with one of these genes.  Most cancers happen by chance and this negative test suggests that her cancer falls into this category.  We, therefore, recommended she continue to follow the cancer management and screening  guidelines provided by her oncology and primary providers.   RECOMMENDATIONS FOR FAMILY MEMBERS:  While these results are reassuring for [name] and her children, this test does not tell us anything about [name]'s siblings' or maternal relatives' risks. We recommended these relatives also have genetic counseling and testing. Please let us know if we can help facilitate testing. Genetic counselors can be located in other cities, by visiting the website of the [employer] (ArtistMovie.se) and Field seismologist for a Dietitian by zip code.  FOLLOW-UP: Lastly, we discussed with [name] that cancer genetics is a rapidly advancing field and it is possible that new genetic tests will be appropriate for her and/or her family members in the future. We encouraged her to remain in contact with cancer genetics on an annual basis so we can update her personal and family histories and let her know of advances in cancer genetics that may benefit this family.   Our contact number was provided. [name] questions were answered to her satisfaction, and she knows she is welcome to call us at anytime with additional questions or concerns.   Catherine A. Fine, MS, CGC Certified Genetic Counseor [email or web address]

## 2013-08-27 ENCOUNTER — Other Ambulatory Visit: Payer: Self-pay | Admitting: Oncology

## 2013-08-27 NOTE — Progress Notes (Unsigned)
Genetic testing was normal, and did not reveal a deleterious mutation in these genes. A complete list of all genes tested is located on the test report scanned into EPIC.

## 2013-08-31 ENCOUNTER — Telehealth: Payer: Self-pay | Admitting: Cardiology

## 2013-08-31 NOTE — Telephone Encounter (Signed)
New message    Patient husband calling  Has questions regarding reading from nuclear stress test with bp 197/88 at rest. Wants to know why she was not admit to hospital.

## 2013-08-31 NOTE — Telephone Encounter (Signed)
Spoke with pt, she is upset because the bp on the stress test was documented as 197/88 and she feels that was written down wrong. She reports her bp is never that high. Discussed what could cause the bp to be high prior to the testing. The pt requested the hand written paperwork from the testing. Paperwork mailed to the pt.

## 2013-09-20 ENCOUNTER — Other Ambulatory Visit: Payer: Self-pay

## 2013-09-20 MED ORDER — ATENOLOL 25 MG PO TABS: ORAL_TABLET | ORAL | Status: DC

## 2013-10-10 ENCOUNTER — Other Ambulatory Visit: Payer: Self-pay | Admitting: Cardiology

## 2013-10-18 ENCOUNTER — Other Ambulatory Visit: Payer: Self-pay | Admitting: Cardiology

## 2013-10-20 ENCOUNTER — Encounter: Payer: Self-pay | Admitting: Cardiology

## 2013-10-23 MED ORDER — ATENOLOL 25 MG PO TABS: 25.0000 mg | ORAL_TABLET | Freq: Every day | ORAL | Status: DC

## 2013-11-28 ENCOUNTER — Ambulatory Visit (HOSPITAL_BASED_OUTPATIENT_CLINIC_OR_DEPARTMENT_OTHER): Payer: Medicare Other | Admitting: Oncology

## 2013-11-28 ENCOUNTER — Telehealth: Payer: Self-pay | Admitting: Oncology

## 2013-11-28 VITALS — BP 125/62 | HR 54 | Temp 98.1°F | Resp 18 | Ht 63.0 in | Wt 206.4 lb

## 2013-11-28 DIAGNOSIS — Z853 Personal history of malignant neoplasm of breast: Secondary | ICD-10-CM

## 2013-11-28 DIAGNOSIS — Z23 Encounter for immunization: Secondary | ICD-10-CM

## 2013-11-28 DIAGNOSIS — D0511 Intraductal carcinoma in situ of right breast: Secondary | ICD-10-CM

## 2013-11-28 MED ORDER — KETOCONAZOLE 2 % EX CREA: 1.0000 "application " | TOPICAL_CREAM | Freq: Every day | CUTANEOUS | Status: DC

## 2013-11-28 MED ORDER — INFLUENZA VAC SPLIT QUAD 0.5 ML IM SUSY
0.5000 mL | PREFILLED_SYRINGE | Freq: Once | INTRAMUSCULAR | Status: AC
  Administered 2013-11-28: 0.5 mL via INTRAMUSCULAR
  Filled 2013-11-28: qty 0.5

## 2013-11-28 NOTE — Telephone Encounter (Signed)
, °

## 2013-11-28 NOTE — Progress Notes (Signed)
ID: Starr F Patriarca   DOB: 03/21/1930  MR#: [id number]  CSN#:[id number]  PCP: GRIFFIN,ELAINE COLLINS, MD GYN: SU:  OTHER MD:   HISTORY OF PRESENT ILLNESS: From the original intake note:  Ms. Matzek has a significant family history of breast cancer and has been followed closely through Dr. Bertrand's office.  She had screening diagnostic mammography 12/27/2007, which only showed scattered parenchymal density.  Even though the mammogram was negative because of the family history, Dr. Bertrand felt an MRI should be obtained for screening and this was done on 03/01/2008.  It showed an asymmetric area of enhancement in the upper inner quadrant of the right breast measuring up to 1.3 cm; an incidental hepatic cyst was also noted.  With this information, ultrasound of the right breast was obtained but this failed to show the abnormality in question, so the patient underwent MRI-guided biopsy of the right breast area of abnormality on 03/26/2008.  The final pathology ([id number] and [id number]) showed a low-grade ductal carcinoma in situ with no evidence of an invasive component, 100% ER and 100% PR positive.    With this information, the patient was referred to Dr. Toth, and after appropriate discussion on 04/24/2008, she underwent needle localization lumpectomy with the final pathology ([id number]) showing a grade 1 ductal carcinoma in situ measuring 8 mm with negative margins, the closest being 2 mm and the specimen being intact. The patient underwent radiation therapy which was completed in June of 2010. She has been on anastrozole since that time.   Her subsequent history as detailed below.  INTERVAL HISTORY: Eugenia returns today accompanied by her husband, Stuart, for  followup of her noninvasive breast cancer. Interval history is significant for her sister, Julia Raven Horst, dying from non-Hodgkin's lymphoma yesterday. Apparently it was difficult to establish the diagnosis and once established it proved to  be to advancede to treat.  REVIEW OF SYSTEMS: She " knows what to do, but I am not doing it". Specifically she is not exercising. She denies unusual headaches, visual changes, nausea, vomiting, cough, phlegm production, pleurisy, or any change in bowel or bladder habits. She "survive this shingles". She has a spot in her left occipital scalp she wants me to look at. A detailed review of systems today was otherwise noncontributory  PAST MEDICAL HISTORY: Past Medical History  Diagnosis Date  . CAD (coronary artery disease) 07/1998    s/p cath in 2000. L main with 30 to 40%  . Obesities, morbid   . HTN (hypertension)   . Breast CA     s/p lumpectomy & radiation  . Hyperlipidemia   . ACE-inhibitor cough   . Thyroid disease   . Osteoporosis   . Hearing loss     PAST SURGICAL HISTORY: Past Surgical History  Procedure Laterality Date  . Breast lumpectomy  March 2010  . Cesarean section    . Total abdominal hysterectomy      FAMILY HISTORY Family History  Problem Relation Age of Onset  . Coronary artery disease Mother   . Hypertension Mother   . Melanoma Mother   . Cancer Mother 75    breast  . Cancer Father 78    prostate  . Cancer Sister 75    breast  . Cancer Maternal Aunt 75    breast  . Cancer Sister 55    breast   GYNECOLOGIC HISTORY:  She is GX P2, first pregnancy to term age 37.  The patient does not know when she went through menopause   since she had a hysterectomy.  She never took hormones.  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  Her husband, Nicole Kindred, used to work for [employer] in Librarian, academic, later taught math, Probation officer, and computers in the local school.  They are both retired.  Their daughter, [name],  does childcare at her church.  Daughter Hildred Alamin works in occupational therapy.  The patient has 2 grandchildren.  The patient is not a church attender.  She previously attended [employer].    ADVANCED DIRECTIVES: not  in place  HEALTH MAINTENANCE: History  Substance Use Topics  . Smoking status: Former Smoker    Types: Cigarettes    Quit date: 01/13/1963  . Smokeless tobacco: Never Used     Comment: stopped in 1970's  . Alcohol Use: No     Colonoscopy:  PAP:  Bone density: SEPT 2012, T -1.7  Lipid panel:  Allergies  Allergen Reactions  . Ace Inhibitors Cough  . Latex     Current Outpatient Prescriptions  Medication Sig Dispense Refill  . aspirin 81 MG tablet Take 81 mg by mouth daily.      Marland Kitchen atenolol (TENORMIN) 25 MG tablet Take 1 tablet (25 mg total) by mouth daily. 90 tablet 3  . Calcium Carbonate-Vitamin D (CALCIUM + D PO) Take by mouth 2 (two) times daily.      . furosemide (LASIX) 20 MG tablet Take 1 tablet (20 mg total) by mouth daily. 90 tablet 3  . levothyroxine (SYNTHROID, LEVOTHROID) 75 MCG tablet Take 75 mcg by mouth daily.      Marland Kitchen losartan (COZAAR) 50 MG tablet TAKE 1 TABLET (50 MG TOTAL) BY MOUTH DAILY. 90 tablet 2  . simvastatin (ZOCOR) 10 MG tablet Take 10 mg by mouth daily.    . [DISCONTINUED] olmesartan (BENICAR) 20 MG tablet Take 20 mg by mouth daily.       No current facility-administered medications for this visit.    OBJECTIVE: Elderly white woman who appears stated age 39 Vitals:   11/28/13 1201  BP: 125/62  Pulse: 54  Temp: 98.1 F (36.7 C)  Resp: 18     Body mass index is 36.57 kg/(m^2).    ECOG FS: 1  Sclerae unicteric, pupils round and equal Oropharynx clear, teeth in good repair No cervical or supraclavicular adenopathy Lungs no rales or rhonchi Heart regular rate and rhythm Abd soft,obese, nontender, positive bowel sounds MSK no focal spinal tenderness, no upper extremity lymphedema,  Neuro: nonfocal, well oriented, positiveaffect Breasts: The right breast is status post lumpectomy and radiation. There is no evidence of local recurrence. The right axilla is benign.The left breast is unremarkable.   LAB RESULTS: Lab Results  Component Value  Date   WBC 6.0 06/27/2013   NEUTROABS 4.0 06/27/2013   HGB 14.2 06/27/2013   HCT 43.1 06/27/2013   MCV 94.3 06/27/2013   PLT 252 06/27/2013      Chemistry      Component Value Date/Time   NA 138 06/27/2013 1037   NA 140 04/03/2011 1207   K 4.5 06/27/2013 1037   K 4.1 04/03/2011 1207   CL 101 10/15/2011 1123   CL 103 04/03/2011 1207   CO2 29 06/27/2013 1037   CO2 29 04/03/2011 1207   BUN 14.1 06/27/2013 1037   BUN 16 04/03/2011 1207   CREATININE 1.0 06/27/2013 1037   CREATININE 0.97 04/03/2011 1207      Component Value Date/Time   CALCIUM 9.3 06/27/2013  1037   CALCIUM 9.0 04/03/2011 1207   ALKPHOS 67 06/27/2013 1037   ALKPHOS 69 04/03/2011 1207   AST 17 06/27/2013 1037   AST 17 04/03/2011 1207   ALT 20 06/27/2013 1037   ALT 15 04/03/2011 1207   BILITOT 0.68 06/27/2013 1037   BILITOT 0.5 04/03/2011 1207       STUDIES:  CLINICAL DATA: Malignant lumpectomy of the right breast in April,  2014, pathology revealing low-grade DCIS. Adjuvant radiation therapy  at that time. Annual evaluation.  EXAM:  DIGITAL DIAGNOSTIC BILATERAL MAMMOGRAM WITH CAD  COMPARISON: 04/27/2012, 03/25/2011, dating back to 03/26/2008.  ACR Breast Density Category b: There are scattered areas of  fibroglandular density.  FINDINGS:  CC and MLO views of both breasts and a spot tangential view of the  right breast at the lumpectomy site were obtained. Post lumpectomy  scarring in the right breast, unchanged. No findings suspicious for  malignancy in either breast.  Mammographic images were processed with CAD.  IMPRESSION:  No specific mammographic evidence of malignancy. Expected post  lumpectomy changes in the right breast.  RECOMMENDATION:  Bilateral diagnostic mammography in 1 year.  I have discussed the findings and recommendations with the patient.  Results were also provided in writing at the conclusion of the  visit. If applicable, a reminder letter will be sent to the patient   regarding the next appointment.  BI-RADS CATEGORY 2: Benign.  Electronically Signed  By: Evangeline Dakin M.D.  On: 05/19/2013 12:22  B[facility] density at [facility] 11/04/2012 shows that T score in the right femoral neck of -1.7  ASSESSMENT: 80 y.o.  BRCA negative [place] woman   (1)  status post right lumpectomy April 2010 for an 8-mm ductal carcinoma in situ, low grade, strongly estrogen and progesterone receptors positive, with negative margins,   (2)  status post radiation completed June 2010,   (3)  on anastrozole between June of 2010 in June of 2015  PLAN: Tomeshia is doing fine as far as her breast cancer is concerned. As previously discussed, she would normally "graduate", but prefers to see [place] once a year and she also likes to get her flu shot here.  I was very sorry to hear about her sister's death.  We went ahead and gave her the flu shot this year, as we usually do.  I also offered Monzerrat participation in our new survivorship program, which should be up and running in a few months. She is very interested. Otherwise she will see me again next November. She knows to call for any problems that may develop before that visit.     MAGRINAT,GUSTAV C    11/28/2013

## 2014-01-15 ENCOUNTER — Ambulatory Visit
Admission: RE | Admit: 2014-01-15 | Discharge: 2014-01-15 | Disposition: A | Discharge status: Home / Self Care | Payer: 59 | Source: Ambulatory Visit | Attending: Family Medicine | Admitting: Family Medicine

## 2014-01-15 ENCOUNTER — Other Ambulatory Visit: Payer: Self-pay | Admitting: Family Medicine

## 2014-01-15 DIAGNOSIS — M5489 Other dorsalgia: Secondary | ICD-10-CM

## 2014-02-19 ENCOUNTER — Telehealth: Payer: Self-pay | Admitting: Adult Health

## 2014-02-19 NOTE — Telephone Encounter (Signed)
I left Ms. Broyhill a voicemail asking her to return my call to schedule her Survivorship Clinic appointment for June 2016 per Dr. Virgie Dad request.   I left my office number and asked that she return my call so that we could get the appointment scheduled.  I look forward to participating in her care.   Mike Craze, NP [place] [phone]

## 2014-02-19 NOTE — Telephone Encounter (Signed)
Carmen Haney returned my call and we have now scheduled her initial [facility] appointment for 06/28/14 at 1:30pm.  Carmen Haney was instructed on where to check-in for this appointment and she was given my direct office number to call me with any questions or concerns before her visit here.  I look forward to participating in her care.   Mike Craze, NP [place] [phone]

## 2014-04-14 ENCOUNTER — Other Ambulatory Visit: Payer: Self-pay | Admitting: Cardiology

## 2014-05-07 ENCOUNTER — Other Ambulatory Visit: Payer: Self-pay | Admitting: Cardiology

## 2014-05-14 ENCOUNTER — Other Ambulatory Visit: Payer: Self-pay | Admitting: Physical Medicine and Rehabilitation

## 2014-05-14 DIAGNOSIS — R9389 Abnormal findings on diagnostic imaging of other specified body structures: Secondary | ICD-10-CM

## 2014-05-17 ENCOUNTER — Ambulatory Visit
Admission: RE | Admit: 2014-05-17 | Discharge: 2014-05-17 | Disposition: A | Discharge status: Home / Self Care | Payer: 59 | Source: Ambulatory Visit | Attending: Physical Medicine and Rehabilitation | Admitting: Physical Medicine and Rehabilitation

## 2014-05-17 DIAGNOSIS — R9389 Abnormal findings on diagnostic imaging of other specified body structures: Secondary | ICD-10-CM

## 2014-06-26 ENCOUNTER — Telehealth: Payer: Self-pay | Admitting: Adult Health

## 2014-06-26 NOTE — Telephone Encounter (Signed)
I spoke with Ms. Sankey to make her aware that I will not be in the office this week to see her on Thursday as I am helping cover the [place] clinic.  We have rescheduled her survivorship visit with me to Friday, 07/06/14 at 11am.  Ms. Eifert is aware of this schedule change and I look forward to participating in her care.   Mike Craze, NP [place] [phone]

## 2014-06-28 ENCOUNTER — Ambulatory Visit: Payer: 59 | Admitting: Adult Health

## 2014-07-06 ENCOUNTER — Encounter: Payer: Self-pay | Admitting: Adult Health

## 2014-07-06 ENCOUNTER — Other Ambulatory Visit: Payer: Self-pay | Admitting: Oncology

## 2014-07-06 ENCOUNTER — Ambulatory Visit (HOSPITAL_BASED_OUTPATIENT_CLINIC_OR_DEPARTMENT_OTHER): Payer: Medicare Other | Admitting: Adult Health

## 2014-07-06 DIAGNOSIS — Z853 Personal history of malignant neoplasm of breast: Secondary | ICD-10-CM

## 2014-07-06 DIAGNOSIS — D0511 Intraductal carcinoma in situ of right breast: Secondary | ICD-10-CM

## 2014-07-06 NOTE — Progress Notes (Signed)
CLINIC:  Cancer Survivorship   REASON FOR VISIT:  Routine follow-up post-treatment for a recent history of breast cancer.  BRIEF ONCOLOGIC HISTORY:    Neoplasm of right breast, primary tumor staging category Tis: ductal carcinoma in situ (DCIS)   03/01/2008 Breast MRI Right breast: Assymetric and smoothly marginated curvilinear area of enhancement in UIQ measuring 13 x 6 x 9 mm.  No suspicious findings in left breast. No axillary lympadenopathy.    03/19/2008 Breast US Right breast: No solid or cystic mass, shadowing, or distortion detected. No palpable finding on physical exam to right breast.    03/26/2008 Initial Biopsy Right breast needle core biopsy: Grade 1, DCIS. ER+ (100%), PR+ (100%).    03/26/2008 Initial Diagnosis Neoplasm of right breast, primary tumor staging category Tis: ductal carcinoma in situ (DCIS)   04/24/2008 Surgery Right breast lumpectomy Marlou Starks): Grade 1, DCIS, spanning 0.8 cm. Negative margins.    04/24/2008 Pathologic Stage pTis, pNx, pMx: Stage 0   06/07/2008 - 07/03/2008 Radiation Therapy Adjuvant RT completed Valere Dross).  Right breast: Total dose 45 Gy over 18 fractions. Right breast boost: 5 Gy over 2 fractions.    06/2008 - 06/2013 Anti-estrogen oral therapy Completed 5 years of anti-estrogen therapy with Anastrazole (Haney)   07/28/2013 Procedure Genetic counseling/testing: BreastNext gene panel negative. Genes tested include: ATM, BARD1, BRCA1/2, BRIP1, CDH1, CHEK2, MRE11A, MUTYH, NBN, NF1, PALB2, PTEN, RAD50, RAD51C, RAD51D, and TP53.    07/06/2014 Survivorship Survivorship Care Plan given and reviewed with patient during in-person clinic visit.      INTERVAL HISTORY:  Carmen Haney presents to the [facility] today for our initial meeting to review her survivorship care plan detailing her treatment course for breast cancer, as well as monitoring long-term side effects of that treatment, education regarding health maintenance, screening, and overall wellness and  health promotion.     Carmen Haney reports feeling very well since completing active treatment in 2010 and her anti-estrogen therapy in 06/2013.  She endorses some periodic right breast soreness, that is tender to touch all throughout the right breast. The tenderness extends laterally towards the right axilla as well.  However, this has been a chronic complaint since completing surgery and radiation and is not a new issue for her.  She also reports having a continued problem with staying asleep.  She states that she has no problem initiating sleep, but often awakes in the middle of the night and is awake for several hours watching TV or reading before falling back to sleep.  She then feels sleepy during the day, but does not regularly take naps.  She is continuing to recover from a back injury in December and walks with a cane.  She has wonderful support in her husband (who is present in clinic today), her children/family, and friends.    REVIEW OF SYSTEMS:  General: Denies fever, chills, unintentional weight loss.  Cardiac: Denies palpitations, chest pain. Respiratory: Denies cough, shortness of breath, and dyspnea on exertion.  GI: Denies abdominal pain, constipation, diarrhea, nausea, or vomiting.  GU: Denies dysuria, hematuria, vaginal bleeding. Musculoskeletal: Endorses back pain, per HPI.  Neuro: Denies headache or recent falls. Denies peripheral neuropathy. Skin: Denies rash, pruritis, or open wounds.  Endorses bilateral lower extremity prominent blood vessels and skin discoloration. Breast: Denies any new nodularity, masses, nipple changes, or nipple discharge. Right breast tenderness per HPI. Psych: Denies depression, anxiety, or memory loss. Endorses sleep disturbance per HPI.   A 14-point review of systems was completed and  was negative, except as noted above.   ONCOLOGY TREATMENT TEAM:  1. Surgeon:  Dr. Marlou Starks at [facility] 2. Medical Oncologist: Dr. [name]  3. Radiation  Oncologist: Dr. Valere Dross    PAST MEDICAL/SURGICAL HISTORY:  Past Medical History  Diagnosis Date  . CAD (coronary artery disease) 07/1998    s/p cath in 2000. L main with 30 to 40%  . Obesities, morbid   . HTN (hypertension)   . Breast CA     s/p lumpectomy & radiation  . Hyperlipidemia   . ACE-inhibitor cough   . Thyroid disease   . Osteoporosis   . Hearing loss    Past Surgical History  Procedure Laterality Date  . Breast lumpectomy  March 2010  . Cesarean section    . Total abdominal hysterectomy       ALLERGIES:  Allergies  Allergen Reactions  . Ace Inhibitors Cough  . Latex      CURRENT MEDICATIONS:  Current Outpatient Prescriptions on File Prior to Visit  Medication Sig Dispense Refill  . aspirin 81 MG tablet Take 81 mg by mouth daily.      Marland Kitchen atenolol (TENORMIN) 25 MG tablet Take 1 tablet (25 mg total) by mouth daily. 90 tablet 3  . Calcium Carbonate-Vitamin D (CALCIUM + D PO) Take by mouth 2 (two) times daily.      . furosemide (LASIX) 20 MG tablet TAKE 1 TABLET (20 MG TOTAL) BY MOUTH DAILY. 90 tablet 0  . ketoconazole (NIZORAL) 2 % cream Apply 1 application topically daily. 15 g 0  . levothyroxine (SYNTHROID, LEVOTHROID) 75 MCG tablet Take 75 mcg by mouth daily.      Marland Kitchen losartan (COZAAR) 50 MG tablet TAKE 1 TABLET (50 MG TOTAL) BY MOUTH DAILY. 90 tablet 0  . simvastatin (ZOCOR) 10 MG tablet Take 10 mg by mouth daily.    . [DISCONTINUED] olmesartan (BENICAR) 20 MG tablet Take 20 mg by mouth daily.       No current facility-administered medications on file prior to visit.     ONCOLOGIC FAMILY HISTORY:  Family History  Problem Relation Age of Onset  . Coronary artery disease Mother   . Hypertension Mother   . Melanoma Mother   . Cancer Mother 48    breast  . Cancer Father 99    prostate  . Cancer Sister 41    breast  . Cancer Maternal Aunt 12    breast  . Cancer Sister 37    breast     GENETIC COUNSELING/TESTING (completed on  07/28/13): BreastNext gene panel negative.  Genes tested include: ATM, BARD1, BRCA1, BRCA2, BRIP1, CDH1, CHEK2, MRE11A, MUTYH, NBN, NF1, PALB2, PTEN, RAD50, RAD51C, RAD51D, and TP53.   SOCIAL HISTORY:  Carmen Haney is married and lives with her husband in [place], [place].  They have been married for 52 years.  They has 2 daughters and 2 grandchildren, ages 105 and 48.  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     PHYSICAL EXAMINATION:  General: Well-nourished, well-appearing female in no acute distress.  She is accompanied by her husband in clinic today.   HEENT: Head is atraumatic and normocephalic.  Pupils equal and reactive to light and accomodation. Conjunctivae clear without exudate.  Sclerae anicteric. Oral mucosa is pink, moist, and intact without lesions.  Oropharynx is pink without lesions or erythema.  Lymph: No cervical, supraclavicular, infraclavicular, or axillary  lymphadenopathy noted on palpation.  Cardiovascular: Regular rate and rhythm without murmurs, rubs, or gallops. Respiratory: Clear to auscultation bilaterally. Chest expansion symmetric without accessory muscle use on inspiration or expiration.  GI: Abdomen soft and round. No tenderness to palpation. Bowel sounds normoactive in 4 quadrants. No hepatosplenomegaly.   GU: Deferred.  Musculoskeletal: Muscle strength 4/5 in all extremities.  Limited back ROM d/t back injury about 6 months ago per patient.   Neuro: No focal deficits. Unsteady gait, but ambulates with a cane.  Psych: Mood and affect normal and appropriate for situation.  Extremities:  Trace lower extremity/ankle edema.  Venous stasis in bilateral lower extremities with skin discoloration and lack of hair growth to lower 1/3 of bilateral legs. No open lesions or statis ulcers. No cyanosis or clubbing.  Skin: Warm and dry. No open lesions noted.   LABORATORY DATA:   None for this visit.  DIAGNOSTIC IMAGING:  None for this visit.  Ordered bilateral 3D diagnostic mammogram for patient today.  She will receive results with Dr. Marlou Starks or Dr. [name].     ASSESSMENT AND PLAN:   1. History of breast cancer:  Carmen Haney will follow-up with her medical oncologist, Dr. [name] in 11/2014 with history and physical exam per surveillance protocol.  I have ordered her bilateral diagnostic mammograms today; she requested the 3D mammogram and I made note of this in the order.  She will see Dr. Marlou Starks in July 2016.  She has now completed 5 years of anti-estrogen therapy with Anastrazole.  A comprehensive survivorship care plan and treatment summary was reviewed with the patient today detailing her breast cancer diagnosis, treatment course, potential late/long-term effects of treatment, appropriate follow-up care with recommendations for the future, and patient education resources.  A copy of this summary, along with a letter will be sent to the patient's primary care provider via mail/fax/In Basket message after today's visit.  Carmen Haney is welcome to return to the Survivorship Clinic in the future, as needed; no follow-up will be scheduled at this time.    2. Sleep disturbance: This has been a chronic issue for Carmen Haney and is likely the result of poor sleep hygiene.  We discussed the importance of developing a bedtime routine that avoids bright lights, television, or sounds at bedtime.  We also discussed limiting caffeine and excessive fluid consumption at dinnertime, as this could contribute to wakefulness as well.  Carmen Haney does not want any prescription medications to help with sleep, however she does not appear to be getting regular restorative sleep as evidenced by her expressed daytime fatigue.  Therefore, I recommended Carmen Haney try OTC Melatonin to see if this may help her sleep disturbance.  She is going to research melantonin and will consider trying it.    3.   Breast pain:  Chronic breast pain after lumpectomy and radiation is common.  The pain is not severe per patient report and does not require pharmacologic intervention at this time.  Carmen Haney is very concerned about addiction and does not like to take medications that could be habit-forming.  She occasionally takes Tylenol for pain, which I encouraged her to continue to do, as needed.  I am not suspicious of any pathologic findings in her right breast with this pain, but she will complete her annual diagnostic mammogram soon and we can evaluate further if needed.  Both Drs. Haney and Marlou Starks are aware of her breast pain.    4. Bone health:  Given Carmen Haney's  age, history of breast cancer, and her previous treatment regimen including anti-estrogen therapy with anastrazole, she is at risk for bone demineralization.  Per our records, her last DEXA scan was on 11/04/12 and showed osteopenia.  She will be due for follow-up DEXA imaging at the discretion of her PCP.  In the meantime, she was encouraged to increase her consumption of foods rich in calcium, as well as increase her weight-bearing activities.  She was given education on specific activities to promote bone health.  5. Cancer screening:  Due to Carmen Haney's history and her age, she should receive screening for skin cancers and colon cancers.  The information and recommendations are listed on the patient's comprehensive care plan/treatment summary and were reviewed in detail with the patient.    6. Health maintenance and wellness promotion: Carmen Haney was encouraged to consume 5-7 servings of fruits and vegetables per day. We reviewed the "Nutrition Rainbow" handout, as well as the handout about "Nutrition for Breast Cancer Survivors."  She was also encouraged to engage in moderate to vigorous exercise for 30 minutes per day most days of the week. We discussed the [employer] fitness program, which is designed for cancer survivors to help them become  more physically fit after cancer treatments.  Carmen Haney is interested in participating. I have contacted the [employer] coordinator who will be in touch with her regarding next steps for registration, etc.  Carmen Haney was instructed to limit her alcohol consumption and continue to abstain from tobacco use.     7. Support services/counseling: It is not uncommon for this period of the patient's cancer care trajectory to be one of many emotions and stressors.  We discussed an opportunity for her to participate in the next session of [facility] ("Finding Your New Normal") support group series designed for patients after they have completed treatment.   Carmen Haney was encouraged to take advantage of our many other support services programs, support groups, and/or counseling in coping with her new life as a cancer survivor after completing anti-cancer treatment.  She was offered support today through active listening and expressive supportive counseling.  She was given information regarding our available services and encouraged to contact me with any questions or for help enrolling in any of our support group/programs.    A total of 55 minutes of face-to-face time was spent with this patient with greater than 50% of that time in counseling and care-coordination.   Mike Craze, NP Survivorship Program [facility] [phone]   Note: PRIMARY CARE PROVIDER [place]borne Casco, [place] [phone]

## 2014-07-09 ENCOUNTER — Other Ambulatory Visit: Payer: Self-pay

## 2014-07-18 ENCOUNTER — Other Ambulatory Visit: Payer: Self-pay | Admitting: Adult Health

## 2014-07-18 ENCOUNTER — Ambulatory Visit
Admission: RE | Admit: 2014-07-18 | Discharge: 2014-07-18 | Disposition: A | Discharge status: Home / Self Care | Payer: Medicare Other | Source: Ambulatory Visit | Attending: Adult Health | Admitting: Adult Health

## 2014-07-18 DIAGNOSIS — D0511 Intraductal carcinoma in situ of right breast: Secondary | ICD-10-CM

## 2014-07-26 ENCOUNTER — Ambulatory Visit
Admission: RE | Admit: 2014-07-26 | Discharge: 2014-07-26 | Disposition: A | Discharge status: Home / Self Care | Payer: Medicare Other | Source: Ambulatory Visit | Attending: Adult Health | Admitting: Adult Health

## 2014-07-26 ENCOUNTER — Other Ambulatory Visit: Payer: Self-pay | Admitting: Adult Health

## 2014-07-26 DIAGNOSIS — D0511 Intraductal carcinoma in situ of right breast: Secondary | ICD-10-CM

## 2014-08-04 ENCOUNTER — Other Ambulatory Visit: Payer: Self-pay | Admitting: Cardiology

## 2014-08-06 ENCOUNTER — Other Ambulatory Visit: Payer: Self-pay

## 2014-08-06 MED ORDER — ATENOLOL 25 MG PO TABS: 25.0000 mg | ORAL_TABLET | Freq: Every day | ORAL | Status: DC

## 2014-08-08 ENCOUNTER — Telehealth: Payer: Self-pay | Admitting: Adult Health

## 2014-08-08 NOTE — Telephone Encounter (Signed)
Carmen Haney called me with some questions regarding some recent abnormal findings in the left breast on her diagnostic mammogram.  She was found to have a complex sclerosing lesion with calcs, consistent with a radial scar.  She is scheduled to see Dr. Marlou Starks for discussion of surgery sometime next week.   She requested a copy of the path report, as well as her mammogram results be sent to her.  I have printed those reports and mailed them to her.  We also briefly discussed what a CSL/radial scar is and she expressed gratitude for the additional explanation.  I encouraged her to call me again with any questions or concerns.   Carmen Craze, NP [facility] [place] [phone]

## 2014-08-17 ENCOUNTER — Other Ambulatory Visit: Payer: Self-pay | Admitting: General Surgery

## 2014-08-17 DIAGNOSIS — N6022 Fibroadenosis of left breast: Secondary | ICD-10-CM

## 2014-08-21 ENCOUNTER — Other Ambulatory Visit: Payer: Self-pay | Admitting: General Surgery

## 2014-08-21 DIAGNOSIS — N6022 Fibroadenosis of left breast: Secondary | ICD-10-CM

## 2014-09-13 ENCOUNTER — Encounter (HOSPITAL_BASED_OUTPATIENT_CLINIC_OR_DEPARTMENT_OTHER): Payer: Self-pay | Admitting: *Deleted

## 2014-09-14 ENCOUNTER — Encounter (HOSPITAL_BASED_OUTPATIENT_CLINIC_OR_DEPARTMENT_OTHER)
Admission: RE | Admit: 2014-09-14 | Discharge: 2014-09-14 | Disposition: A | Discharge status: Home / Self Care | Payer: Medicare Other | Source: Ambulatory Visit | Attending: General Surgery | Admitting: General Surgery

## 2014-09-14 ENCOUNTER — Other Ambulatory Visit: Payer: Self-pay

## 2014-09-14 DIAGNOSIS — D0511 Intraductal carcinoma in situ of right breast: Secondary | ICD-10-CM | POA: Diagnosis not present

## 2014-09-14 DIAGNOSIS — Z87891 Personal history of nicotine dependence: Secondary | ICD-10-CM | POA: Diagnosis not present

## 2014-09-14 DIAGNOSIS — Z9889 Other specified postprocedural states: Secondary | ICD-10-CM | POA: Diagnosis not present

## 2014-09-14 DIAGNOSIS — E079 Disorder of thyroid, unspecified: Secondary | ICD-10-CM | POA: Diagnosis not present

## 2014-09-14 DIAGNOSIS — N649 Disorder of breast, unspecified: Secondary | ICD-10-CM | POA: Diagnosis present

## 2014-09-14 DIAGNOSIS — Z8249 Family history of ischemic heart disease and other diseases of the circulatory system: Secondary | ICD-10-CM | POA: Diagnosis not present

## 2014-09-14 DIAGNOSIS — Z86010 Personal history of colonic polyps: Secondary | ICD-10-CM | POA: Diagnosis not present

## 2014-09-14 DIAGNOSIS — Z853 Personal history of malignant neoplasm of breast: Secondary | ICD-10-CM | POA: Diagnosis not present

## 2014-09-14 DIAGNOSIS — Z79899 Other long term (current) drug therapy: Secondary | ICD-10-CM | POA: Diagnosis not present

## 2014-09-14 DIAGNOSIS — Z90711 Acquired absence of uterus with remaining cervical stump: Secondary | ICD-10-CM | POA: Diagnosis not present

## 2014-09-14 DIAGNOSIS — Z811 Family history of alcohol abuse and dependence: Secondary | ICD-10-CM | POA: Diagnosis not present

## 2014-09-14 DIAGNOSIS — Z8673 Personal history of transient ischemic attack (TIA), and cerebral infarction without residual deficits: Secondary | ICD-10-CM | POA: Diagnosis not present

## 2014-09-14 DIAGNOSIS — Z7982 Long term (current) use of aspirin: Secondary | ICD-10-CM | POA: Diagnosis not present

## 2014-09-14 DIAGNOSIS — Z01818 Encounter for other preprocedural examination: Secondary | ICD-10-CM | POA: Diagnosis not present

## 2014-09-14 DIAGNOSIS — I10 Essential (primary) hypertension: Secondary | ICD-10-CM | POA: Diagnosis not present

## 2014-09-14 DIAGNOSIS — Z8261 Family history of arthritis: Secondary | ICD-10-CM | POA: Diagnosis not present

## 2014-09-14 DIAGNOSIS — Z8042 Family history of malignant neoplasm of prostate: Secondary | ICD-10-CM | POA: Diagnosis not present

## 2014-09-14 DIAGNOSIS — Z8489 Family history of other specified conditions: Secondary | ICD-10-CM | POA: Diagnosis not present

## 2014-09-14 DIAGNOSIS — K219 Gastro-esophageal reflux disease without esophagitis: Secondary | ICD-10-CM | POA: Diagnosis not present

## 2014-09-14 DIAGNOSIS — N6032 Fibrosclerosis of left breast: Secondary | ICD-10-CM | POA: Diagnosis not present

## 2014-09-14 DIAGNOSIS — Z808 Family history of malignant neoplasm of other organs or systems: Secondary | ICD-10-CM | POA: Diagnosis not present

## 2014-09-14 DIAGNOSIS — Z833 Family history of diabetes mellitus: Secondary | ICD-10-CM | POA: Diagnosis not present

## 2014-09-14 LAB — BASIC METABOLIC PANEL
ANION GAP: 6 (ref 5–15)
BUN: 13 mg/dL (ref 6–20)
CALCIUM: 8.9 mg/dL (ref 8.9–10.3)
CO2: 31 mmol/L (ref 22–32)
Chloride: 97 mmol/L — ABNORMAL LOW (ref 101–111)
Creatinine, Ser: 0.93 mg/dL (ref 0.44–1.00)
GFR, EST NON AFRICAN AMERICAN: 56 mL/min — AB (ref >60)
GLUCOSE: 123 mg/dL — AB (ref 65–99)
Potassium: 4.7 mmol/L (ref 3.5–5.1)
SODIUM: 134 mmol/L — AB (ref 135–145)

## 2014-09-18 ENCOUNTER — Ambulatory Visit
Admission: RE | Admit: 2014-09-18 | Discharge: 2014-09-18 | Disposition: A | Discharge status: Home / Self Care | Payer: Medicare Other | Source: Ambulatory Visit | Attending: General Surgery | Admitting: General Surgery

## 2014-09-18 DIAGNOSIS — N6022 Fibroadenosis of left breast: Secondary | ICD-10-CM

## 2014-09-20 ENCOUNTER — Encounter (HOSPITAL_BASED_OUTPATIENT_CLINIC_OR_DEPARTMENT_OTHER): Payer: Self-pay | Admitting: *Deleted

## 2014-09-20 ENCOUNTER — Ambulatory Visit
Admission: RE | Admit: 2014-09-20 | Discharge: 2014-09-20 | Disposition: A | Discharge status: Home / Self Care | Payer: Medicare Other | Source: Ambulatory Visit | Attending: General Surgery | Admitting: General Surgery

## 2014-09-20 ENCOUNTER — Ambulatory Visit (HOSPITAL_BASED_OUTPATIENT_CLINIC_OR_DEPARTMENT_OTHER)
Admission: RE | Admit: 2014-09-20 | Discharge: 2014-09-20 | Disposition: A | Discharge status: Home / Self Care | Payer: Medicare Other | Source: Ambulatory Visit | Attending: General Surgery | Admitting: General Surgery

## 2014-09-20 ENCOUNTER — Ambulatory Visit (HOSPITAL_BASED_OUTPATIENT_CLINIC_OR_DEPARTMENT_OTHER): Payer: Medicare Other | Admitting: Anesthesiology

## 2014-09-20 ENCOUNTER — Encounter (HOSPITAL_BASED_OUTPATIENT_CLINIC_OR_DEPARTMENT_OTHER)
Admission: RE | Disposition: A | Discharge status: Home / Self Care | Payer: Self-pay | Source: Ambulatory Visit | Attending: General Surgery

## 2014-09-20 DIAGNOSIS — Z808 Family history of malignant neoplasm of other organs or systems: Secondary | ICD-10-CM | POA: Insufficient documentation

## 2014-09-20 DIAGNOSIS — Z853 Personal history of malignant neoplasm of breast: Secondary | ICD-10-CM | POA: Insufficient documentation

## 2014-09-20 DIAGNOSIS — Z811 Family history of alcohol abuse and dependence: Secondary | ICD-10-CM | POA: Insufficient documentation

## 2014-09-20 DIAGNOSIS — Z86010 Personal history of colonic polyps: Secondary | ICD-10-CM | POA: Insufficient documentation

## 2014-09-20 DIAGNOSIS — Z8261 Family history of arthritis: Secondary | ICD-10-CM | POA: Insufficient documentation

## 2014-09-20 DIAGNOSIS — Z7982 Long term (current) use of aspirin: Secondary | ICD-10-CM | POA: Insufficient documentation

## 2014-09-20 DIAGNOSIS — Z8249 Family history of ischemic heart disease and other diseases of the circulatory system: Secondary | ICD-10-CM | POA: Insufficient documentation

## 2014-09-20 DIAGNOSIS — K219 Gastro-esophageal reflux disease without esophagitis: Secondary | ICD-10-CM | POA: Diagnosis not present

## 2014-09-20 DIAGNOSIS — Z79899 Other long term (current) drug therapy: Secondary | ICD-10-CM | POA: Insufficient documentation

## 2014-09-20 DIAGNOSIS — Z9889 Other specified postprocedural states: Secondary | ICD-10-CM | POA: Insufficient documentation

## 2014-09-20 DIAGNOSIS — N6032 Fibrosclerosis of left breast: Secondary | ICD-10-CM | POA: Insufficient documentation

## 2014-09-20 DIAGNOSIS — Z90711 Acquired absence of uterus with remaining cervical stump: Secondary | ICD-10-CM | POA: Insufficient documentation

## 2014-09-20 DIAGNOSIS — Z8673 Personal history of transient ischemic attack (TIA), and cerebral infarction without residual deficits: Secondary | ICD-10-CM | POA: Insufficient documentation

## 2014-09-20 DIAGNOSIS — E079 Disorder of thyroid, unspecified: Secondary | ICD-10-CM | POA: Insufficient documentation

## 2014-09-20 DIAGNOSIS — N6022 Fibroadenosis of left breast: Secondary | ICD-10-CM

## 2014-09-20 DIAGNOSIS — Z8042 Family history of malignant neoplasm of prostate: Secondary | ICD-10-CM | POA: Insufficient documentation

## 2014-09-20 DIAGNOSIS — Z8489 Family history of other specified conditions: Secondary | ICD-10-CM | POA: Insufficient documentation

## 2014-09-20 DIAGNOSIS — Z833 Family history of diabetes mellitus: Secondary | ICD-10-CM | POA: Insufficient documentation

## 2014-09-20 DIAGNOSIS — I10 Essential (primary) hypertension: Secondary | ICD-10-CM | POA: Insufficient documentation

## 2014-09-20 DIAGNOSIS — Z87891 Personal history of nicotine dependence: Secondary | ICD-10-CM | POA: Insufficient documentation

## 2014-09-20 HISTORY — PX: BREAST LUMPECTOMY WITH RADIOACTIVE SEED LOCALIZATION: SHX6424

## 2014-09-20 SURGERY — BREAST LUMPECTOMY WITH RADIOACTIVE SEED LOCALIZATION
Anesthesia: General | Site: Breast | Laterality: Left

## 2014-09-20 MED ORDER — ACETAMINOPHEN 160 MG/5ML PO SOLN: 325.0000 mg | ORAL | Status: DC | PRN

## 2014-09-20 MED ORDER — FENTANYL CITRATE (PF) 100 MCG/2ML IJ SOLN
INTRAMUSCULAR | Status: DC | PRN
  Administered 2014-09-20 (×2): 50 ug via INTRAVENOUS

## 2014-09-20 MED ORDER — FENTANYL CITRATE (PF) 100 MCG/2ML IJ SOLN
INTRAMUSCULAR | Status: AC
  Filled 2014-09-20: qty 4

## 2014-09-20 MED ORDER — DEXAMETHASONE SODIUM PHOSPHATE 4 MG/ML IJ SOLN
INTRAMUSCULAR | Status: DC | PRN
  Administered 2014-09-20: 10 mg via INTRAVENOUS

## 2014-09-20 MED ORDER — BUPIVACAINE-EPINEPHRINE (PF) 0.25% -1:200000 IJ SOLN
INTRAMUSCULAR | Status: AC
  Filled 2014-09-20: qty 90

## 2014-09-20 MED ORDER — CHLORHEXIDINE GLUCONATE 4 % EX LIQD: 1.0000 "application " | Freq: Once | CUTANEOUS | Status: DC

## 2014-09-20 MED ORDER — FENTANYL CITRATE (PF) 100 MCG/2ML IJ SOLN: 50.0000 ug | INTRAMUSCULAR | Status: DC | PRN

## 2014-09-20 MED ORDER — ONDANSETRON HCL 4 MG/2ML IJ SOLN
INTRAMUSCULAR | Status: AC
  Filled 2014-09-20: qty 2

## 2014-09-20 MED ORDER — PROPOFOL 10 MG/ML IV BOLUS
INTRAVENOUS | Status: AC
  Filled 2014-09-20: qty 20

## 2014-09-20 MED ORDER — CEFAZOLIN SODIUM-DEXTROSE 2-3 GM-% IV SOLR
2.0000 g | INTRAVENOUS | Status: AC
  Administered 2014-09-20: 2 g via INTRAVENOUS

## 2014-09-20 MED ORDER — CEFAZOLIN SODIUM-DEXTROSE 2-3 GM-% IV SOLR
INTRAVENOUS | Status: AC
  Filled 2014-09-20: qty 50

## 2014-09-20 MED ORDER — OXYCODONE-ACETAMINOPHEN 5-325 MG PO TABS: 1.0000 | ORAL_TABLET | ORAL | Status: DC | PRN

## 2014-09-20 MED ORDER — FENTANYL CITRATE (PF) 100 MCG/2ML IJ SOLN
25.0000 ug | INTRAMUSCULAR | Status: DC | PRN
  Administered 2014-09-20 (×5): 25 ug via INTRAVENOUS

## 2014-09-20 MED ORDER — OXYCODONE HCL 5 MG PO TABS: 5.0000 mg | ORAL_TABLET | Freq: Once | ORAL | Status: DC | PRN

## 2014-09-20 MED ORDER — OXYCODONE HCL 5 MG/5ML PO SOLN: 5.0000 mg | Freq: Once | ORAL | Status: DC | PRN

## 2014-09-20 MED ORDER — BUPIVACAINE-EPINEPHRINE (PF) 0.25% -1:200000 IJ SOLN
INTRAMUSCULAR | Status: DC | PRN
  Administered 2014-09-20: 20 mL

## 2014-09-20 MED ORDER — LACTATED RINGERS IV SOLN
INTRAVENOUS | Status: DC
  Administered 2014-09-20: 10 mL/h via INTRAVENOUS
  Administered 2014-09-20: 12:00:00 via INTRAVENOUS

## 2014-09-20 MED ORDER — MIDAZOLAM HCL 2 MG/2ML IJ SOLN: 1.0000 mg | INTRAMUSCULAR | Status: DC | PRN

## 2014-09-20 MED ORDER — DEXAMETHASONE SODIUM PHOSPHATE 10 MG/ML IJ SOLN
INTRAMUSCULAR | Status: AC
  Filled 2014-09-20: qty 1

## 2014-09-20 MED ORDER — FENTANYL CITRATE (PF) 100 MCG/2ML IJ SOLN
INTRAMUSCULAR | Status: AC
  Filled 2014-09-20: qty 2

## 2014-09-20 MED ORDER — GLYCOPYRROLATE 0.2 MG/ML IJ SOLN: 0.2000 mg | Freq: Once | INTRAMUSCULAR | Status: DC | PRN

## 2014-09-20 MED ORDER — ONDANSETRON HCL 4 MG/2ML IJ SOLN
INTRAMUSCULAR | Status: DC | PRN
  Administered 2014-09-20: 4 mg via INTRAVENOUS

## 2014-09-20 MED ORDER — LIDOCAINE HCL (CARDIAC) 20 MG/ML IV SOLN
INTRAVENOUS | Status: AC
  Filled 2014-09-20: qty 5

## 2014-09-20 MED ORDER — SCOPOLAMINE 1 MG/3DAYS TD PT72: 1.0000 | MEDICATED_PATCH | Freq: Once | TRANSDERMAL | Status: DC | PRN

## 2014-09-20 MED ORDER — GLYCOPYRROLATE 0.2 MG/ML IJ SOLN
INTRAMUSCULAR | Status: DC | PRN
  Administered 2014-09-20: 0.2 mg via INTRAVENOUS

## 2014-09-20 MED ORDER — PROPOFOL 10 MG/ML IV BOLUS
INTRAVENOUS | Status: DC | PRN
  Administered 2014-09-20: 130 mg via INTRAVENOUS

## 2014-09-20 MED ORDER — ACETAMINOPHEN 325 MG PO TABS: 325.0000 mg | ORAL_TABLET | ORAL | Status: DC | PRN

## 2014-09-20 MED ORDER — LIDOCAINE HCL (CARDIAC) 20 MG/ML IV SOLN
INTRAVENOUS | Status: DC | PRN
  Administered 2014-09-20: 60 mg via INTRAVENOUS

## 2014-09-20 MED ORDER — ACETAMINOPHEN 160 MG/5ML PO SOLN
325.0000 mg | ORAL | Status: DC | PRN
Start: 2014-09-20 — End: 2014-09-20

## 2014-09-20 MED ORDER — EPHEDRINE SULFATE 50 MG/ML IJ SOLN
INTRAMUSCULAR | Status: AC
  Filled 2014-09-20: qty 1

## 2014-09-20 MED ORDER — PHENYLEPHRINE HCL 10 MG/ML IJ SOLN
INTRAMUSCULAR | Status: DC | PRN
  Administered 2014-09-20: 40 ug via INTRAVENOUS

## 2014-09-20 SURGICAL SUPPLY — 40 items
APPLIER CLIP 9.375 MED OPEN (MISCELLANEOUS) ×2
APR CLP MED 9.3 20 MLT OPN (MISCELLANEOUS) ×1
BLADE SURG 15 STRL LF DISP TIS (BLADE) ×1 IMPLANT
BLADE SURG 15 STRL SS (BLADE) ×2
CANISTER SUCT 1200ML W/VALVE (MISCELLANEOUS) ×2 IMPLANT
CHLORAPREP W/TINT 26ML (MISCELLANEOUS) ×2 IMPLANT
CLIP APPLIE 9.375 MED OPEN (MISCELLANEOUS) ×1 IMPLANT
COVER BACK TABLE 60X90IN (DRAPES) ×2 IMPLANT
COVER MAYO STAND STRL (DRAPES) ×2 IMPLANT
COVER PROBE W GEL 5X96 (DRAPES) ×2 IMPLANT
DECANTER SPIKE VIAL GLASS SM (MISCELLANEOUS) IMPLANT
DEVICE DUBIN W/COMP PLATE 8390 (MISCELLANEOUS) ×2 IMPLANT
DRAPE LAPAROSCOPIC ABDOMINAL (DRAPES) IMPLANT
DRAPE UTILITY XL STRL (DRAPES) ×2 IMPLANT
ELECT COATED BLADE 2.86 ST (ELECTRODE) ×2 IMPLANT
ELECT REM PT RETURN 9FT ADLT (ELECTROSURGICAL) ×2
ELECTRODE REM PT RTRN 9FT ADLT (ELECTROSURGICAL) ×1 IMPLANT
GLOVE BIO SURGEON STRL SZ7.5 (GLOVE) ×4 IMPLANT
GLOVE BIOGEL PI IND STRL 7.5 (GLOVE) IMPLANT
GLOVE BIOGEL PI INDICATOR 7.5 (GLOVE) ×2
GLOVE SURG SS PI 7.5 STRL IVOR (GLOVE) ×3 IMPLANT
GOWN STRL REUS W/ TWL LRG LVL3 (GOWN DISPOSABLE) ×2 IMPLANT
GOWN STRL REUS W/TWL LRG LVL3 (GOWN DISPOSABLE) ×4
KIT MARKER MARGIN INK (KITS) ×2 IMPLANT
LIQUID BAND (GAUZE/BANDAGES/DRESSINGS) ×2 IMPLANT
NDL HYPO 25X1 1.5 SAFETY (NEEDLE) IMPLANT
NEEDLE HYPO 25X1 1.5 SAFETY (NEEDLE) ×2 IMPLANT
NS IRRIG 1000ML POUR BTL (IV SOLUTION) ×1 IMPLANT
PACK BASIN DAY SURGERY FS (CUSTOM PROCEDURE TRAY) ×2 IMPLANT
PENCIL BUTTON HOLSTER BLD 10FT (ELECTRODE) ×2 IMPLANT
SLEEVE SCD COMPRESS KNEE MED (MISCELLANEOUS) ×2 IMPLANT
SPONGE LAP 18X18 X RAY DECT (DISPOSABLE) ×2 IMPLANT
SUT MON AB 4-0 PC3 18 (SUTURE) IMPLANT
SUT SILK 2 0 SH (SUTURE) IMPLANT
SUT VICRYL 3-0 CR8 SH (SUTURE) ×2 IMPLANT
SYR CONTROL 10ML LL (SYRINGE) IMPLANT
TOWEL OR 17X24 6PK STRL BLUE (TOWEL DISPOSABLE) ×2 IMPLANT
TOWEL OR NON WOVEN STRL DISP B (DISPOSABLE) ×2 IMPLANT
TUBE CONNECTING 20X1/4 (TUBING) ×2 IMPLANT
YANKAUER SUCT BULB TIP NO VENT (SUCTIONS) IMPLANT

## 2014-09-20 NOTE — Transfer of Care (Signed)
Immediate Anesthesia Transfer of Care Note  Patient: Carmen Haney  Procedure(s) Performed: Procedure(s): LEFT BREAST LUMPECTOMY WITH RADIOACTIVE SEED LOCALIZATION (Left)  Patient Location: PACU  Anesthesia Type:General  Level of Consciousness: awake, alert  and oriented  Airway & Oxygen Therapy: Patient Spontanous Breathing and Patient connected to nasal cannula oxygen  Post-op Assessment: Report given to RN and Post -op Vital signs reviewed and stable  Post vital signs: Reviewed and stable  Last Vitals:  Filed Vitals:   09/20/14 1212  BP:   Pulse: 90  Temp: 36.7 C  Resp: 32    Complications: No apparent anesthesia complications

## 2014-09-20 NOTE — Anesthesia Postprocedure Evaluation (Signed)
  Anesthesia Post-op Note  Patient: Carmen Haney  Procedure(s) Performed: Procedure(s): LEFT BREAST LUMPECTOMY WITH RADIOACTIVE SEED LOCALIZATION (Left)  Patient Location: PACU  Anesthesia Type:General  Level of Consciousness: awake  Airway and Oxygen Therapy: Patient Spontanous Breathing  Post-op Pain: mild  Post-op Assessment: Post-op Vital signs reviewed, Patient's Cardiovascular Status Stable, Respiratory Function Stable, Patent Airway, No signs of Nausea or vomiting and Pain level controlled              Post-op Vital Signs: Reviewed and stable  Last Vitals:  Filed Vitals:   09/20/14 1454  BP: 152/63  Pulse: 77  Temp: 36.7 C  Resp: 18    Complications: No apparent anesthesia complications

## 2014-09-20 NOTE — Op Note (Signed)
09/20/2014  12:04 PM  PATIENT:  Carmen Haney  81 y.o. female  PRE-OPERATIVE DIAGNOSIS:  LEFT BREAST COMPLEX SCLEROSING LESION  POST-OPERATIVE DIAGNOSIS:  LEFT BREAST COMPLEX SCLEROSING LESION  PROCEDURE:  Procedure(s): LEFT BREAST LUMPECTOMY WITH RADIOACTIVE SEED LOCALIZATION (Left)  SURGEON:  Surgeon(s) and Role:    * Jovita Kussmaul, MD - Primary  PHYSICIAN ASSISTANT:   ASSISTANTS: none   ANESTHESIA:   general  EBL:     BLOOD ADMINISTERED:none  DRAINS: none   LOCAL MEDICATIONS USED:  MARCAINE     SPECIMEN:  Source of Specimen:  left breast tissue  DISPOSITION OF SPECIMEN:  PATHOLOGY  COUNTS:  YES  TOURNIQUET:  * No tourniquets in log *  DICTATION: .Dragon Dictation   After informed consent was obtained the patient was brought to the operating room and placed in the supine position on the operating room table. After adequate induction of general anesthesia the patient's left breast was prepped with ChloraPrep, allowed to dry, and draped in usual sterile manner. Previously an I-125 seed was placed in the upper aspect of the left breast to mark an area of a complex sclerosing lesion. The neoprobe was set to I-125 in the area of radioactivity in the upper left breast was readily identified. A elliptical incision was made overlying the area of radioactivity with a 15 blade knife. The incision was carried through the skin and subcutaneous tissue sharply with electrocautery. While checking the area of radioactivity frequently a circular portion of breast tissue was excised sharply around the radioactive seed. Once the specimen was removed it was oriented with the appropriate paint colors. Radioactivity was confirmed in the specimen and there was no residual radioactivity in the left breast. A specimen radiograph was obtained that showed the clip in seed to be in the center of the specimen. The specimen was then sent to pathology for further evaluation. Hemostasis was achieved using  the Bovie electrocautery. The wound was irrigated with saline and infiltrated with quarter percent Marcaine. The deep layer of the wound was then closed with layers of interrupted 3-0 Vicryl stitches. An closed with interrupted 4-0 Monocryl subcuticular stitches. Dermabond dressings were applied. The patient tolerated the procedure well. At the end of the case all needle sponge and instrument counts were correct. The patient was then awakened and taken to recovery in stable condition.  PLAN OF CARE: Discharge to home after PACU  PATIENT DISPOSITION:  PACU - hemodynamically stable.   Delay start of Pharmacological VTE agent (>24hrs) due to surgical blood loss or risk of bleeding: not applicable

## 2014-09-20 NOTE — Discharge Instructions (Signed)

## 2014-09-20 NOTE — Anesthesia Preprocedure Evaluation (Signed)
Anesthesia Evaluation  Patient identified by MRN, date of birth, ID band Patient awake    Reviewed: Allergy & Precautions, NPO status , Patient's Chart, lab work & pertinent test results, reviewed documented beta blocker date and time   History of Anesthesia Complications Negative for: history of anesthetic complications  Airway Mallampati: II  TM Distance: >3 FB Neck ROM: Full    Dental  (+) Teeth Intact   Pulmonary shortness of breath and with exertion, neg sleep apnea, neg COPD, neg recent URI, former smoker, neg PE   breath sounds clear to auscultation       Cardiovascular hypertension, Pt. on medications and Pt. on home beta blockers (-) angina+ CAD  (-) Past MI and (-) CHF  Rhythm:Regular     Neuro/Psych TIAnegative psych ROS   GI/Hepatic negative GI ROS, Neg liver ROS,   Endo/Other  Hypothyroidism Morbid obesity  Renal/GU negative Renal ROS     Musculoskeletal negative musculoskeletal ROS (+)   Abdominal   Peds  Hematology negative hematology ROS (+)   Anesthesia Other Findings   Reproductive/Obstetrics                             Anesthesia Physical Anesthesia Plan  ASA: III  Anesthesia Plan: General   Post-op Pain Management:    Induction: Intravenous  Airway Management Planned: LMA  Additional Equipment: None  Intra-op Plan:   Post-operative Plan: Extubation in OR  Informed Consent: I have reviewed the patients History and Physical, chart, labs and discussed the procedure including the risks, benefits and alternatives for the proposed anesthesia with the patient or authorized representative who has indicated his/her understanding and acceptance.   Dental advisory given  Plan Discussed with: CRNA and Surgeon  Anesthesia Plan Comments:         Anesthesia Quick Evaluation

## 2014-09-20 NOTE — H&P (Signed)
Marcelia Petersen 08/17/2014 11:47 AM Location: [facility] Patient #: [id number] DOB: Jul 08, 1930 Married / Language: Cleophus Molt / Race: White Female  History of Present Illness Sammuel Hines. Marlou Starks MD; 08/17/2014 1:39 PM) Patient words: breast f/u.  The patient is a 81 year old female who presents with a breast mass. The patient is an 81 year old white female who is 5-1/2 years status post right lumpectomy for DCIS. On her most recent mammogram she was found to have a small area of abnormal calcifications in the upper portion of the left breast. This was biopsied and came back as a complex sclerosing lesion. She denies any breast pain. She denies any discharge from the nipple. She was able to discontinue the antiestrogen last year.   Other Problems Marjean Donna, CMA; 08/17/2014 11:48 AM) Back Pain Breast Cancer Cancer Cerebrovascular Accident Gastroesophageal Reflux Disease High blood pressure Thyroid Disease  Past Surgical History Marjean Donna, CMA; 08/17/2014 11:48 AM) Breast Biopsy Bilateral. multiple Breast Mass; Local Excision Right. Cataract Surgery Right. Cesarean Section - 1 Colon Polyp Removal - Colonoscopy Hysterectomy (not due to cancer) - Partial  Diagnostic Studies History Marjean Donna, CMA; 08/17/2014 11:48 AM) Colonoscopy 1-5 years ago Mammogram within last year  Allergies Marjean Donna, CMA; 08/17/2014 11:48 AM) ACE Inhibitors Latex Exam Gloves *MEDICAL DEVICES AND SUPPLIES*  Medication History Marjean Donna, CMA; 08/17/2014 11:52 AM) Aspirin (81MG  Tablet Chewable, Oral) Active. Atenolol (25MG  Tablet, Oral) Active. Calcium (500MG  Tablet, Oral) Active. Furosemide (20MG  Tablet, Oral) Active. Levothyroxine Sodium (75MCG Tablet, Oral) Active. Simvastatin (10MG  Tablet, Oral) Active. Medications Reconciled  Social History ([place]; 08/17/2014 11:48 AM) Alcohol use Remotely quit alcohol use. Caffeine use Carbonated beverages, Coffee,  Tea. No drug use Tobacco use Former smoker.  Family History Marjean Donna, CMA; 08/17/2014 11:48 AM) Alcohol Abuse Brother, Mother. Arthritis Mother. Breast Cancer Mother, Sister. Cancer Sister. Diabetes Mellitus Sister. Heart Disease Mother. Hypertension Mother. Melanoma Mother. Migraine Headache Sister. Prostate Cancer Father. Thyroid problems Daughter, Sister.  Pregnancy / Birth History Marjean Donna, [place]; 08/17/2014 11:48 AM) Age at menarche 59 years. Age of menopause 69-50 Gravida 2 Irregular periods Maternal age 70-35 Para 2  Review of Systems ([place]; 08/17/2014 11:48 AM) General Not Present- Appetite Loss, Chills, Fatigue, Fever, Night Sweats, Weight Gain and Weight Loss. HEENT Present- Hearing Loss, Ringing in the Ears and Wears glasses/contact lenses. Not Present- Earache, Hoarseness, Nose Bleed, Oral Ulcers, Seasonal Allergies, Sinus Pain, Sore Throat, Visual Disturbances and Yellow Eyes. Respiratory Present- Snoring. Not Present- Bloody sputum, Chronic Cough, Difficulty Breathing and Wheezing. Breast Present- Skin Changes. Not Present- Breast Mass, Breast Pain and Nipple Discharge. Cardiovascular Present- Shortness of Breath. Not Present- Chest Pain, Difficulty Breathing Lying Down, Leg Cramps, Palpitations, Rapid Heart Rate and Swelling of Extremities. Gastrointestinal Present- Difficulty Swallowing. Not Present- Abdominal Pain, Bloating, Bloody Stool, Change in Bowel Habits, Chronic diarrhea, Constipation, Excessive gas, Gets full quickly at meals, Hemorrhoids, Indigestion, Nausea, Rectal Pain and Vomiting. Female Genitourinary Present- Urgency. Not Present- Frequency, Nocturia, Painful Urination and Pelvic Pain. Musculoskeletal Present- Back Pain and Joint Pain. Not Present- Joint Stiffness, Muscle Pain, Muscle Weakness and Swelling of Extremities. Neurological Present- Numbness, Tingling and Trouble walking. Not Present- Decreased Memory,  Fainting, Headaches, Seizures, Tremor and Weakness. Endocrine Not Present- Cold Intolerance, Excessive Hunger, Hair Changes, Heat Intolerance, Hot flashes and New Diabetes. Hematology Not Present- Easy Bruising, Excessive bleeding, Gland problems, HIV and Persistent Infections.   Vitals (Sonya Bynum CMA; 08/17/2014 11:52 AM) 08/17/2014 11:51 AM Weight: 208 lb Height: 65in  Body Surface Area: 2.08 m Body Mass Index: 34.61 kg/m Temp.: 30F(Temporal)  Pulse: 53 (Regular)  BP: 130/80 (Sitting, Left Arm, Standard)    Physical Exam Eddie Dibbles S. Marlou Starks MD; 08/17/2014 1:40 PM) General Mental Status-Alert. General Appearance-Consistent with stated age. Hydration-Well hydrated. Voice-Normal.  Head and Neck Head-normocephalic, atraumatic with no lesions or palpable masses. Trachea-midline. Thyroid Gland Characteristics - normal size and consistency.  Eye Eyeball - Bilateral-Extraocular movements intact. Sclera/Conjunctiva - Bilateral-No scleral icterus.  Chest and Lung Exam Chest and lung exam reveals -quiet, even and easy respiratory effort with no use of accessory muscles and on auscultation, normal breath sounds, no adventitious sounds and normal vocal resonance. Inspection Chest Wall - Normal. Back - normal.  Breast Note: The right breast incision has healed nicely with no sign of infection or significant seroma. There is no palpable mass in either breast. There is no palpable axillary, supraclavicular, or cervical lymphadenopathy.   Cardiovascular Cardiovascular examination reveals -normal heart sounds, regular rate and rhythm with no murmurs and normal pedal pulses bilaterally.  Abdomen Inspection Inspection of the abdomen reveals - No Hernias. Skin - Scar - no surgical scars. Palpation/Percussion Palpation and Percussion of the abdomen reveal - Soft, Non Tender, No Rebound tenderness, No Rigidity (guarding) and No  hepatosplenomegaly. Auscultation Auscultation of the abdomen reveals - Bowel sounds normal.  Neurologic Neurologic evaluation reveals -alert and oriented x 3 with no impairment of recent or remote memory. Mental Status-Normal.  Musculoskeletal Normal Exam - Left-Upper Extremity Strength Normal and Lower Extremity Strength Normal. Normal Exam - Right-Upper Extremity Strength Normal and Lower Extremity Strength Normal.  Lymphatic Head & Neck  General Head & Neck Lymphatics: Bilateral - Description - Normal. Axillary  General Axillary Region: Bilateral - Description - Normal. Tenderness - Non Tender. Femoral & Inguinal  Generalized Femoral & Inguinal Lymphatics: Bilateral - Description - Normal. Tenderness - Non Tender.    Assessment & Plan Eddie Dibbles S. Marlou Starks MD; 08/17/2014 12:23 PM) SCLEROSING ADENOSIS OF BREAST, LEFT (610.2  N60.22) Impression: The patient appears to have a complex sclerosing lesion in the upper portion of the left breast. Because of its abnormal appearance and because it is considered a high risk lesion I would recommend having this area removed. I have discussed with her in detail the risks and benefits of the operation to remove this area as well as some of the technical aspects and she understands and wishes to proceed. I will plan for a left breast radioactive seed localized lumpectomy     Signed by Luella Cook, MD (08/17/2014 1:43 PM)

## 2014-09-20 NOTE — Interval H&P Note (Signed)
History and Physical Interval Note:  09/20/2014 10:53 AM  Carmen Haney  has presented today for surgery, with the diagnosis of LEFT BREAST COMPLEX SCLEROSING LESION  The various methods of treatment have been discussed with the patient and family. After consideration of risks, benefits and other options for treatment, the patient has consented to  Procedure(s): LEFT BREAST LUMPECTOMY WITH RADIOACTIVE SEED LOCALIZATION (Left) as a surgical intervention .  The patient's history has been reviewed, patient examined, no change in status, stable for surgery.  I have reviewed the patient's chart and labs.  Questions were answered to the patient's satisfaction.     TOTH III,PAUL S

## 2014-09-20 NOTE — Anesthesia Procedure Notes (Signed)
Procedure Name: LMA Insertion Date/Time: 09/20/2014 11:28 AM Performed by: Melynda Ripple D Pre-anesthesia Checklist: Patient identified, Emergency Drugs available, Suction available, Patient being monitored and Timeout performed Patient Re-evaluated:Patient Re-evaluated prior to inductionOxygen Delivery Method: Circle System Utilized Preoxygenation: Pre-oxygenation with 100% oxygen Intubation Type: IV induction Ventilation: Mask ventilation without difficulty LMA: LMA inserted LMA Size: 4.0 Number of attempts: 1 Airway Equipment and Method: Bite block Placement Confirmation: positive ETCO2 Tube secured with: Tape Dental Injury: Teeth and Oropharynx as per pre-operative assessment

## 2014-09-21 ENCOUNTER — Encounter (HOSPITAL_BASED_OUTPATIENT_CLINIC_OR_DEPARTMENT_OTHER): Payer: Self-pay | Admitting: General Surgery

## 2014-09-24 ENCOUNTER — Other Ambulatory Visit: Payer: Self-pay | Admitting: Oncology

## 2014-10-12 ENCOUNTER — Other Ambulatory Visit: Payer: Self-pay | Admitting: Cardiology

## 2014-11-05 ENCOUNTER — Other Ambulatory Visit: Payer: Self-pay | Admitting: *Deleted

## 2014-11-05 MED ORDER — FUROSEMIDE 20 MG PO TABS: ORAL_TABLET | ORAL | Status: DC

## 2014-11-15 ENCOUNTER — Telehealth: Payer: Self-pay | Admitting: Oncology

## 2014-11-15 ENCOUNTER — Ambulatory Visit (HOSPITAL_BASED_OUTPATIENT_CLINIC_OR_DEPARTMENT_OTHER): Payer: Medicare Other | Admitting: Oncology

## 2014-11-15 VITALS — BP 145/61 | HR 52 | Temp 98.1°F | Resp 18 | Ht 63.0 in | Wt 208.6 lb

## 2014-11-15 DIAGNOSIS — D0511 Intraductal carcinoma in situ of right breast: Secondary | ICD-10-CM

## 2014-11-15 DIAGNOSIS — Z86000 Personal history of in-situ neoplasm of breast: Secondary | ICD-10-CM | POA: Diagnosis not present

## 2014-11-15 DIAGNOSIS — Z23 Encounter for immunization: Secondary | ICD-10-CM

## 2014-11-15 DIAGNOSIS — M8580 Other specified disorders of bone density and structure, unspecified site: Secondary | ICD-10-CM

## 2014-11-15 DIAGNOSIS — N6022 Fibroadenosis of left breast: Secondary | ICD-10-CM | POA: Insufficient documentation

## 2014-11-15 MED ORDER — INFLUENZA VAC SPLIT QUAD 0.5 ML IM SUSY
0.5000 mL | PREFILLED_SYRINGE | Freq: Once | INTRAMUSCULAR | Status: AC
  Administered 2014-11-15: 0.5 mL via INTRAMUSCULAR
  Filled 2014-11-15: qty 0.5

## 2014-11-15 MED ORDER — KETOCONAZOLE 2 % EX CREA: 1.0000 "application " | TOPICAL_CREAM | Freq: Every day | CUTANEOUS | Status: DC

## 2014-11-15 NOTE — Telephone Encounter (Signed)
Gave patient avs report and appointments for May 2017 (LTS) and November 2017.

## 2014-11-15 NOTE — Progress Notes (Signed)
ID: Carmen Haney   DOB: 01/07/1931  MR#: [id number]  GLO#:[id number]  PCP: Osborne Casco, MD GYN: SUStar Age MD OTHER MD: Suella Broad MD  CHIEF COMPLAINT: Ductal carcinoma in situ  CURRENT TREATMENT: Observation   HISTORY OF PRESENT ILLNESS: From the original intake note:  Carmen Haney has a significant family history of breast cancer and has been followed closely through Dr. Gareth Eagle office.  She had screening diagnostic mammography 12/27/2007, which only showed scattered parenchymal density.  Even though the mammogram was negative because of the family history, Dr. Isaiah Blakes felt an MRI should be obtained for screening and this was done on 03/01/2008.  It showed an asymmetric area of enhancement in the upper inner quadrant of the right breast measuring up to 1.3 cm; an incidental hepatic cyst was also noted.  With this information, ultrasound of the right breast was obtained but this failed to show the abnormality in question, so the patient underwent MRI-guided biopsy of the right breast area of abnormality on 03/26/2008.  The final pathology ([id number] and [id number]) showed a low-grade ductal carcinoma in situ with no evidence of an invasive component, 100% ER and 100% PR positive.    With this information, the patient was referred to Dr. Marlou Starks, and after appropriate discussion on 04/24/2008, she underwent needle localization lumpectomy with the final pathology [id number]) showing a grade 1 ductal carcinoma in situ measuring 8 mm with negative margins, the closest being 2 mm and the specimen being intact. The patient underwent radiation therapy which was completed in June of 2010. She has been on anastrozole since that time.   Her subsequent history as detailed below.  INTERVAL HISTORY: Carmen Haney returns today accompanied by her husband, Carmen Haney. The recent history is significant for her having had bilateral diagnostic mammography with tomography 07/18/2014 showing a group of  indeterminate calcifications in the left breast. This was biopsied 07/26/2014, and showed ([id number]) a complex sclerosing lesion. Accordingly the patient was referred to Dr. Marlou Starks and on 09/20/2014 underwent left lumpectomy for what proved to be (SZA [phone]) a complex sclerosing lesion with calcifications; there was no atypia or malignancy.   REVIEW OF SYSTEMS: She is seeing Dr. Dossie Der for what she tells me are worsening fractures of her upper spine. Apparently she was carrying a 10-15 pound package in it caused her to have worsening problems. She is getting rehabilitation through his office. She did not get epidural shots. She has insomnia which has been present for years. She feels fatigued. She needs to have another implant in her for cataracts, but has neglected this. Her hearing aid is working fairly well. She has an irregular heartbeat which is controlled on beta blockers. She gets short of breath when walking up stairs. She feels anxious but not depressed. The back is a worse problem for her, she says. She occasionally has problems with candidal rashes and she would like me to refill her ketoconazole cream. A detailed review of systems is otherwise stable  PAST MEDICAL HISTORY: Past Medical History  Diagnosis Date  . CAD (coronary artery disease) 07/1998    s/p cath in 2000. L main with 30 to 40%  . Obesities, morbid   . HTN (hypertension)   . Breast CA     s/p lumpectomy & radiation  . Hyperlipidemia   . ACE-inhibitor cough   . Thyroid disease   . Osteoporosis   . Hearing loss     PAST SURGICAL HISTORY: Past Surgical History  Procedure Laterality Date  .  Breast lumpectomy  March 2010  . Cesarean section    . Total abdominal hysterectomy    . Breast lumpectomy with radioactive seed localization Left 09/20/2014    Procedure: LEFT BREAST LUMPECTOMY WITH RADIOACTIVE SEED LOCALIZATION;  Surgeon: Autumn Messing III, MD;  Location: [facility];  Service: General;  Laterality:  Left;    FAMILY HISTORY Family History  Problem Relation Age of Onset  . Coronary artery disease Mother   . Hypertension Mother   . Melanoma Mother   . Cancer Mother 8    breast  . Cancer Father 25    prostate  . Cancer Sister 57    breast  . Cancer Maternal Aunt 108    breast  . Cancer Sister 56    breast   GYNECOLOGIC HISTORY:  She is GX P2, first pregnancy to term age 89.  The patient does not know when she went through menopause since she had a hysterectomy.  She never took hormones.  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  Her husband, Carmen Haney, used to work for [employer] in Librarian, academic, later taught math, Probation officer, and computers in the local school.  They are both retired.  Their daughter, Carmen Haney,  does childcare at her church.  [name] works in occupational therapy.  The patient has 2 grandchildren.  The patient is not a church attender.  She previously attended [employer].    ADVANCED DIRECTIVES: not in place  HEALTH MAINTENANCE: Social History  Substance Use Topics  . Smoking status: Former Smoker    Types: Cigarettes    Quit date: 01/13/1963  . Smokeless tobacco: Never Used     Comment: stopped in 1970's  . Alcohol Use: No     Colonoscopy:  PAP:  Bone density: SEPT 2012, T -1.7  Lipid panel:  Allergies  Allergen Reactions  . Ace Inhibitors Cough  . Latex     Current Outpatient Prescriptions  Medication Sig Dispense Refill  . aspirin 81 MG tablet Take 81 mg by mouth daily.      Marland Kitchen atenolol (TENORMIN) 25 MG tablet Take 1 tablet (25 mg total) by mouth daily. 30 tablet 0  . Calcium Carbonate-Vitamin D (CALCIUM + D PO) Take by mouth 2 (two) times daily.      . furosemide (LASIX) 20 MG tablet TAKE 1 TABLET (20 MG TOTAL) BY MOUTH DAILY. 30 tablet 1  . ketoconazole (NIZORAL) 2 % cream Apply 1 application topically daily. 15 g 0  . levothyroxine (SYNTHROID, LEVOTHROID) 75 MCG tablet Take 75 mcg by mouth  daily.      Marland Kitchen losartan (COZAAR) 50 MG tablet TAKE 1 TABLET (50 MG TOTAL) BY MOUTH DAILY.  NEED TO CALL AND SCHEDULE AN APPOINMENT. 30 tablet 0  . simvastatin (ZOCOR) 10 MG tablet Take 10 mg by mouth daily.    . [DISCONTINUED] olmesartan (BENICAR) 20 MG tablet Take 20 mg by mouth daily.       No current facility-administered medications for this visit.    OBJECTIVE: Elderly white woman in no acute distress Filed Vitals:   11/15/14 1102  BP: 145/61  Pulse: 52  Temp: 98.1 F (36.7 C)  Resp: 18     Body mass index is 36.96 kg/(m^2).    ECOG FS: 1  Sclerae unicteric, EOMs intact Oropharynx clear, dentition in good repair No cervical or supraclavicular adenopathy Lungs no rales or rhonchi Heart regular rate and rhythm Abd soft, obese, nontender, positive  bowel sounds MSK no focal spinal tenderness, no upper extremity lymphedema Neuro: nonfocal, well oriented, anxious affect Breasts: The right breast is status post lumpectomy and radiation. There is no evidence of local recurrence. The right axilla is benign. The left breast is status post recent lumpectomy. The incision is healing nicely. There is no swelling, erythema, or dehiscence. The left axilla is benign. Thank you no other (5) osteopenia with a T score of -1.7    LAB RESULTS: Lab Results  Component Value Date   WBC 6.0 06/27/2013   NEUTROABS 4.0 06/27/2013   HGB 14.2 06/27/2013   HCT 43.1 06/27/2013   MCV 94.3 06/27/2013   PLT 252 06/27/2013      Chemistry      Component Value Date/Time   NA 134* 09/14/2014 1200   NA 138 06/27/2013 1037   K 4.7 09/14/2014 1200   K 4.5 06/27/2013 1037   CL 97* 09/14/2014 1200   CL 101 10/15/2011 1123   CO2 31 09/14/2014 1200   CO2 29 06/27/2013 1037   BUN 13 09/14/2014 1200   BUN 14.1 06/27/2013 1037   CREATININE 0.93 09/14/2014 1200   CREATININE 1.0 06/27/2013 1037      Component Value Date/Time   CALCIUM 8.9 09/14/2014 1200   CALCIUM 9.3 06/27/2013 1037   ALKPHOS 67  06/27/2013 1037   ALKPHOS 69 04/03/2011 1207   AST 17 06/27/2013 1037   AST 17 04/03/2011 1207   ALT 20 06/27/2013 1037   ALT 15 04/03/2011 1207   BILITOT 0.68 06/27/2013 1037   BILITOT 0.5 04/03/2011 1207       STUDIES: No results found.  ASSESSMENT: 81 y.o.  BRCA negative [place] woman   (1)  status post right lumpectomy April 2010 for an 8-mm ductal carcinoma in situ, low grade, strongly estrogen and progesterone receptors positive, with negative margins,   (2)  status post radiation completed June 2010,   (3)  on anastrozole between June of 2010 in June of 2015  (4) status post left lumpectomy 09/20/2014 for a complex sclerosing lesion with no malignancy or atypia  (5) osteopenia with a T score of -1.7 in bone density at [facility] 11/04/2012     PLAN: [place] is now 6-1/2 years out from her original breast surgery, for noninvasive disease. She is 2 months out from the more recent surgery again for nonmalignant breast disease.  We discussed her pathology in detail and she understands there was no atypia or malignancy. She is making excellent recovery.  She is working closely with orthopedics, Dr. Nelva Bush, regarding her back problems. That seems to be helping.  We went ahead and gave her a flu shot today.  She wanted to understand all her lab tests and I gave her a handout explaining what they me. She enjoyed meeting one of our survivorship nurse practitioners, Elzie Rings.. She is going to see a different survivorship nurse practitioner, Chestine Spore, in 6 months. She will then return to this clinic a year from now. Those appointments have all been entered.   She knows to call for any problems that may develop before her next visit here.      Vayden Weinand C    11/15/2014

## 2014-11-15 NOTE — Addendum Note (Signed)
Addended by: Laureen Abrahams on: 11/15/2014 02:26 PM   Modules accepted: Orders

## 2014-11-16 ENCOUNTER — Telehealth: Payer: Self-pay | Admitting: Cardiology

## 2014-11-16 MED ORDER — LOSARTAN POTASSIUM 50 MG PO TABS: ORAL_TABLET | ORAL | Status: DC

## 2014-11-16 NOTE — Telephone Encounter (Signed)
°*  STAT* If patient is at the pharmacy, call can be transferred to refill team.   1. Which medications need to be refilled? (please list name of each medication and dose if known) Losartan Potassium 50mg   2. Which pharmacy/location (including street and city if local pharmacy) is medication to be sent to? [facility] at [place]   3. Do they need a 30 day or 90 day supply? [place]

## 2014-11-16 NOTE — Telephone Encounter (Signed)
Refill sent to to pharmacy

## 2014-12-05 ENCOUNTER — Other Ambulatory Visit: Payer: Self-pay | Admitting: Cardiology

## 2014-12-05 NOTE — Progress Notes (Signed)
HPI: FU coronary disease. Patient had a cardiac catheterization in 2002 that showed a 40% left main. In August 2015 pt had myoview that showed EF 64 with no ischemia. Carotid dopplers 8/15 normal. Since she was last seen the patient has dyspnea with more extreme activities but not with routine activities. It is relieved with rest. It is not associated with chest pain. There is no orthopnea, PND or pedal edema. There is no syncope or palpitations. There is no exertional chest pain.   Current Outpatient Prescriptions  Medication Sig Dispense Refill  . aspirin 81 MG tablet Take 81 mg by mouth daily.      Marland Kitchen atenolol (TENORMIN) 25 MG tablet Take 1 tablet (25 mg total) by mouth daily. 30 tablet 0  . Calcium Carbonate-Vitamin D (CALCIUM + D PO) Take by mouth 2 (two) times daily.      . furosemide (LASIX) 20 MG tablet TAKE 1 TABLET (20 MG TOTAL) BY MOUTH DAILY. 30 tablet 0  . ketoconazole (NIZORAL) 2 % cream Apply 1 application topically daily. 15 g 0  . levothyroxine (SYNTHROID, LEVOTHROID) 75 MCG tablet Take 75 mcg by mouth daily.      Marland Kitchen losartan (COZAAR) 50 MG tablet TAKE 1 TABLET (50 MG TOTAL) BY MOUTH DAILY. 30 tablet 0  . simvastatin (ZOCOR) 10 MG tablet Take 10 mg by mouth daily.    . [DISCONTINUED] olmesartan (BENICAR) 20 MG tablet Take 20 mg by mouth daily.       No current facility-administered medications for this visit.     Past Medical History  Diagnosis Date  . CAD (coronary artery disease) 07/1998    s/p cath in 2000. L main with 30 to 40%  . Obesities, morbid ([place])   . HTN (hypertension)   . Breast CA ([place])     s/p lumpectomy & radiation  . Hyperlipidemia   . ACE-inhibitor cough   . Thyroid disease   . Osteoporosis   . Hearing loss     Past Surgical History  Procedure Laterality Date  . Breast lumpectomy  March 2010  . Cesarean section    . Total abdominal hysterectomy    . Breast lumpectomy with radioactive seed localization Left 09/20/2014    Procedure: LEFT  BREAST LUMPECTOMY WITH RADIOACTIVE SEED LOCALIZATION;  Surgeon: Autumn Messing III, MD;  Location: [facility];  Service: General;  Laterality: Left;    Social History   Social History  . Marital Status: Married    Spouse Name: Tressie Ellis  . Number of Children: N/A  . Years of Education: N/A   Occupational History  . Retired from [employer]  . Smoking status: Former Smoker    Types: Cigarettes    Quit date: 01/13/1963  . Smokeless tobacco: Never Used     Comment: stopped in 1970's  . Alcohol Use: No  . Drug Use: No  . Sexual Activity: Not on file   Other Topics Concern  . Not on file   Social History Narrative    ROS: no fevers or chills, productive cough, hemoptysis, dysphasia, odynophagia, melena, hematochezia, dysuria, hematuria, rash, seizure activity, orthopnea, PND, pedal edema, claudication. Remaining systems are negative.  Physical Exam: Well-developed obese in no acute distress.  Skin is warm and dry.  HEENT is normal.  Neck is supple.  Chest is clear to auscultation with normal expansion.  Cardiovascular exam is regular rate and rhythm.  Abdominal exam nontender or distended. No  masses palpated. Extremities show trace edema. neuro grossly intact   Electrocardiogram 09/14/2014 showed sinus rhythm and left anterior fascicular block.

## 2014-12-14 ENCOUNTER — Ambulatory Visit (INDEPENDENT_AMBULATORY_CARE_PROVIDER_SITE_OTHER): Payer: Medicare Other | Admitting: Cardiology

## 2014-12-14 ENCOUNTER — Encounter: Payer: Self-pay | Admitting: Cardiology

## 2014-12-14 VITALS — BP 100/60 | HR 60 | Ht 62.5 in | Wt 207.2 lb

## 2014-12-14 DIAGNOSIS — I2583 Coronary atherosclerosis due to lipid rich plaque: Principal | ICD-10-CM

## 2014-12-14 DIAGNOSIS — I10 Essential (primary) hypertension: Secondary | ICD-10-CM

## 2014-12-14 DIAGNOSIS — I2510 Atherosclerotic heart disease of native coronary artery without angina pectoris: Secondary | ICD-10-CM

## 2014-12-14 DIAGNOSIS — E785 Hyperlipidemia, unspecified: Secondary | ICD-10-CM | POA: Diagnosis not present

## 2014-12-14 NOTE — Assessment & Plan Note (Signed)
Blood pressure controlled. Continue present medications. Potassium and renal function monitored by primary care. 

## 2014-12-14 NOTE — Patient Instructions (Signed)
Your physician wants you to follow-up in: ONE YEAR WITH DR CRENSHAW You will receive a reminder letter in the mail two months in advance. If you don't receive a letter, please call our office to schedule the follow-up appointment.   If you need a refill on your cardiac medications before your next appointment, please call your pharmacy.  

## 2014-12-14 NOTE — Assessment & Plan Note (Signed)
Continue statin. Lipids and liver monitored by primary care. 

## 2014-12-14 NOTE — Assessment & Plan Note (Signed)
Continue aspirin and statin. 

## 2014-12-17 ENCOUNTER — Other Ambulatory Visit: Payer: Self-pay | Admitting: Cardiology

## 2014-12-17 MED ORDER — ATENOLOL 25 MG PO TABS: 25.0000 mg | ORAL_TABLET | Freq: Every day | ORAL | Status: DC

## 2014-12-17 MED ORDER — LOSARTAN POTASSIUM 50 MG PO TABS: 50.0000 mg | ORAL_TABLET | Freq: Every day | ORAL | Status: DC

## 2014-12-17 NOTE — Telephone Encounter (Signed)
REFILL 

## 2015-01-11 ENCOUNTER — Other Ambulatory Visit: Payer: Self-pay | Admitting: Cardiology

## 2015-01-11 NOTE — Telephone Encounter (Signed)
Rx request sent to pharmacy.  

## 2015-05-10 ENCOUNTER — Telehealth: Payer: Self-pay | Admitting: Nurse Practitioner

## 2015-05-10 NOTE — Telephone Encounter (Signed)
[facility] - moved 5/4 LTS visit to 5/12. Spoke with patient she is aware.

## 2015-05-16 ENCOUNTER — Other Ambulatory Visit: Payer: Medicare Other

## 2015-05-16 ENCOUNTER — Encounter: Payer: Medicare Other | Admitting: Nurse Practitioner

## 2015-05-24 ENCOUNTER — Other Ambulatory Visit (HOSPITAL_BASED_OUTPATIENT_CLINIC_OR_DEPARTMENT_OTHER): Payer: Medicare Other

## 2015-05-24 ENCOUNTER — Telehealth: Payer: Self-pay | Admitting: Nurse Practitioner

## 2015-05-24 ENCOUNTER — Ambulatory Visit (HOSPITAL_BASED_OUTPATIENT_CLINIC_OR_DEPARTMENT_OTHER): Payer: Medicare Other | Admitting: Nurse Practitioner

## 2015-05-24 ENCOUNTER — Encounter: Payer: Self-pay | Admitting: Nurse Practitioner

## 2015-05-24 VITALS — BP 139/73 | HR 55 | Temp 98.4°F | Resp 17 | Wt 208.2 lb

## 2015-05-24 DIAGNOSIS — Z86000 Personal history of in-situ neoplasm of breast: Secondary | ICD-10-CM

## 2015-05-24 DIAGNOSIS — N6022 Fibroadenosis of left breast: Secondary | ICD-10-CM

## 2015-05-24 DIAGNOSIS — D0511 Intraductal carcinoma in situ of right breast: Secondary | ICD-10-CM

## 2015-05-24 LAB — CBC WITH DIFFERENTIAL/PLATELET
BASO%: 0.5 % (ref 0.0–2.0)
BASOS ABS: 0 10*3/uL (ref 0.0–0.1)
EOS ABS: 0.1 10*3/uL (ref 0.0–0.5)
EOS%: 2.2 % (ref 0.0–7.0)
HCT: 42 % (ref 34.8–46.6)
HEMOGLOBIN: 14.5 g/dL (ref 11.6–15.9)
LYMPH%: 28.5 % (ref 14.0–49.7)
MCH: 31.7 pg (ref 25.1–34.0)
MCHC: 34.5 g/dL (ref 31.5–36.0)
MCV: 91.9 fL (ref 79.5–101.0)
MONO#: 0.6 10*3/uL (ref 0.1–0.9)
MONO%: 10 % (ref 0.0–14.0)
NEUT#: 3.2 10*3/uL (ref 1.5–6.5)
NEUT%: 58.8 % (ref 38.4–76.8)
Platelets: 228 10*3/uL (ref 145–400)
RBC: 4.57 10*6/uL (ref 3.70–5.45)
RDW: 13.5 % (ref 11.2–14.5)
WBC: 5.5 10*3/uL (ref 3.9–10.3)
lymph#: 1.6 10*3/uL (ref 0.9–3.3)

## 2015-05-24 LAB — COMPREHENSIVE METABOLIC PANEL
ALBUMIN: 3.8 g/dL (ref 3.5–5.0)
ALK PHOS: 68 U/L (ref 40–150)
ALT: 13 U/L (ref 0–55)
AST: 14 U/L (ref 5–34)
Anion Gap: 8 mEq/L (ref 3–11)
BUN: 11.9 mg/dL (ref 7.0–26.0)
CALCIUM: 9.6 mg/dL (ref 8.4–10.4)
CHLORIDE: 102 meq/L (ref 98–109)
CO2: 29 mEq/L (ref 22–29)
Creatinine: 0.9 mg/dL (ref 0.6–1.1)
EGFR: 58 mL/min/{1.73_m2} — AB (ref >90)
GLUCOSE: 109 mg/dL (ref 70–140)
POTASSIUM: 4.2 meq/L (ref 3.5–5.1)
SODIUM: 139 meq/L (ref 136–145)
Total Bilirubin: 0.71 mg/dL (ref 0.20–1.20)
Total Protein: 6.9 g/dL (ref 6.4–8.3)

## 2015-05-24 NOTE — Telephone Encounter (Signed)
Gave and printed appt sched and avs for pt for may 2018...the patient ok and aware.Marland KitchenMarland Kitchen

## 2015-05-24 NOTE — Patient Instructions (Signed)
Thank you for coming in today!  As we discussed, please continue to perform your self breast exam and report any changes. If you note any new symptoms (please see below), be sure to notify Carmen Haney ASAP.  Your mammogram will be due in July 2017 and we will enter orders for it today.  You will see Dr. Jana Haney in November 2017 and we'll have you return in one year's time for your next appointment  with Carmen Haney in Survivorship. Please be sure to stop by scheduling on your way out to make those appointment(s).  Looking forward to working with you in the future!  Let Carmen Haney know if you have any questions!  Symptoms to Watch for and Report to Your Provider  . Return of the cancer symptoms you had before- such as a lump or new growth where your cancer first started . New or unusual pain that seems unrelated to an injury and does not go away, including back pain or bone pain . Weight loss without trying/intending . Unexplained bleeding . A rash or allergic reaction, such as swelling, severe itching or wheezing . Chills or fevers . Persistent headaches . Shortness of breath or difficulty breathing . Bloody stools or blood in your urine . Lumps, bumps, swelling and/or nipple discharge . Nausea, vomiting, diarrhea, loss of appetite, or trouble swallowing . A cough that doesn't go away . Abdominal pain . Swelling in your arms or legs . Fractures . Hot flashes or other menopausal symptoms . Any other signs mentioned by your doctor or nurse or any unusual symptoms                 that you just can't explain   NOTE: Just because you have certain symptoms, it doesn't mean the cancer has come back or you have a new cancer. Symptoms can be due to other problems that need to be addressed.  It is important to watch for these symptoms and report them to your provider so you can be medically evaluated for any of these concerns!    Living a Life of Wellness After Cancer:  *Note: Please consult your health care provider before  using any medications, supplements, over-the-counter products, or other interventions.  Also, please consult your primary care provider before you begin any lifestyle program (diet, exercise, etc.).  Your safety is our top priority and we want to make sure you continue to live a long and healthy life!    Healthy Lifestyle Recommendations  As a cancer survivor, it is important develop a lifelong commitment to a healthy lifestyle. A healthy lifestyle can prevent cancer from returning as well as prevent other diseases like heart disease, diabetes and high blood pressure.  These are some things that you can do to have a healthy lifestyle:  Marland Kitchen Maintain a healthy weight.  . Exercise daily per your doctor's orders. . Eat a balanced diet high in fruits, vegetables, bran, and fiber. Limit intake of red meat      and processed foods.  . Limit how much alcohol you consume, if at all. Carmen Haney regular bone mineral density testing for osteoporosis.  . Talk to your doctor about cardiovascular disease or "heart disease" screening. . Stop smoking (if you smoke). . Know your family history. . Be mindful of your emotional, social, and spiritual needs. . Meet regularly with a Primary Care Provider (PCP). Find a PCP if you do not             already have  one. . Talk to your doctor about regular cancer screening including screening for colon           cancer, GYN cancers, and skin cancer.

## 2015-05-24 NOTE — Telephone Encounter (Signed)
appt made and avs to print after visit

## 2015-05-24 NOTE — Progress Notes (Signed)
CLINIC:  Cancer Survivorship   REASON FOR VISIT:  Routine follow-up post-treatment for history of breast cancer.  BRIEF ONCOLOGIC HISTORY:    Neoplasm of right breast, primary tumor staging category Tis: ductal carcinoma in situ (DCIS)   03/01/2008 Breast MRI Right breast: Assymetric and smoothly marginated curvilinear area of enhancement in UIQ measuring 13 x 6 x 9 mm.  No suspicious findings in left breast. No axillary lympadenopathy.    03/19/2008 Breast US Right breast: No solid or cystic mass, shadowing, or distortion detected. No palpable finding on physical exam to right breast.    03/26/2008 Initial Biopsy Right breast needle core biopsy: Grade 1, DCIS. ER+ (100%), PR+ (100%).    03/26/2008 Initial Diagnosis Neoplasm of right breast, primary tumor staging category Tis: ductal carcinoma in situ (DCIS)   04/24/2008 Surgery Right breast lumpectomy Marlou Starks): Grade 1, DCIS, spanning 0.8 cm. Negative margins.    04/24/2008 Pathologic Stage pTis, pNx, pMx: Stage 0   06/07/2008 - 07/03/2008 Radiation Therapy Adjuvant RT completed Valere Dross).  Right breast: Total dose 45 Gy over 18 fractions. Right breast boost: 5 Gy over 2 fractions.    06/2008 - 06/2013 Anti-estrogen oral therapy Completed 5 years of anti-estrogen therapy with Anastrazole (Magrinat)   07/28/2013 Procedure Genetic counseling/testing: BreastNext gene panel negative. Genes tested include: ATM, BARD1, BRCA1/2, BRIP1, CDH1, CHEK2, MRE11A, MUTYH, NBN, NF1, PALB2, PTEN, RAD50, RAD51C, RAD51D, and TP53.    07/06/2014 Survivorship Survivorship Care Plan given and reviewed with patient during in-person clinic visit.     INTERVAL HISTORY:  Ms. Markes presents to the Survivorship Clinic today for ongoing follow up regarding her history of breast cancer. Overall, Ms. Enyeart reports feeling doing well since her last visit with Dr. [name] in November 2016. She continues to heal well following resection of the benign complex sclerosing lesion found  within her left breast in 09/2014.  She has not noticed any change within her breast.  She missed her appointment with Dr. Marlou Starks due to illness, but plans to reschedule.  She continues with stable pain along her right rib cage that has been present since her initial surgery.  She denies any change.  She continues with back pain and completed therapy with Dr. Nelva Bush.  She states she was told that she will likely always have pain and that it is no worse.  She denies any headache, cough, or shortness of breath. She reports a good appetite and denies any weight loss.    REVIEW OF SYSTEMS:  General: Denies fever, chills, unintentional weight loss, or generalized fatigue.  HEENT: Intermittent tearing.  Left cataract.  Denies hearing loss, mouth sores, or difficulty swallowing. Cardiac: Denies palpitations and lower extremity edema.  Respiratory: Denies wheeze or dyspnea on exertion.  Breast: As above. GI: Denies abdominal pain, constipation, diarrhea, nausea, or vomiting.  GU: Denies dysuria, hematuria, vaginal bleeding, vaginal discharge, or vaginal dryness.  Musculoskeletal: As above. Neuro: Denies recent fall or numbness / tingling in her extremities.  Skin: Denies rash, pruritis, or open wounds.  Psych: Denies depression, anxiety, insomnia, or memory loss.   A 14-point review of systems was completed and was negative, except as noted above.   ONCOLOGY TREATMENT TEAM:  1. Surgeon:  Dr. Marlou Starks at [facility]  2. Medical Oncologist: Dr. [name] 3. Radiation Oncologist: Dr. Valere Dross    PAST MEDICAL/SURGICAL HISTORY:  Past Medical History  Diagnosis Date  . CAD (coronary artery disease) 07/1998    s/p cath in 2000. L main with 30  to 40%  . Obesities, morbid ([place])   . HTN (hypertension)   . Breast CA ([place])     s/p lumpectomy & radiation  . Hyperlipidemia   . ACE-inhibitor cough   . Thyroid disease   . Osteoporosis   . Hearing loss    Past Surgical History  Procedure Laterality  Date  . Breast lumpectomy  March 2010  . Cesarean section    . Total abdominal hysterectomy    . Breast lumpectomy with radioactive seed localization Left 09/20/2014    Procedure: LEFT BREAST LUMPECTOMY WITH RADIOACTIVE SEED LOCALIZATION;  Surgeon: Autumn Messing III, MD;  Location: [facility];  Service: General;  Laterality: Left;     ALLERGIES:  Allergies  Allergen Reactions  . Ace Inhibitors Cough  . Latex      CURRENT MEDICATIONS:  Current Outpatient Prescriptions on File Prior to Visit  Medication Sig Dispense Refill  . aspirin 81 MG tablet Take 81 mg by mouth daily.      Marland Kitchen atenolol (TENORMIN) 25 MG tablet Take 1 tablet (25 mg total) by mouth daily. 90 tablet 3  . Calcium Carbonate-Vitamin D (CALCIUM + D PO) Take by mouth 2 (two) times daily.      . furosemide (LASIX) 20 MG tablet TAKE 1 TABLET BY MOUTH DAILY. 30 tablet 11  . levothyroxine (SYNTHROID, LEVOTHROID) 75 MCG tablet Take 75 mcg by mouth daily.      Marland Kitchen losartan (COZAAR) 50 MG tablet Take 1 tablet (50 mg total) by mouth daily. 90 tablet 3  . simvastatin (ZOCOR) 10 MG tablet Take 10 mg by mouth daily.    Marland Kitchen ketoconazole (NIZORAL) 2 % cream Apply 1 application topically daily. (Patient not taking: Reported on 05/24/2015) 15 g 0  . [DISCONTINUED] olmesartan (BENICAR) 20 MG tablet Take 20 mg by mouth daily.       No current facility-administered medications on file prior to visit.     ONCOLOGIC FAMILY HISTORY:  Family History  Problem Relation Age of Onset  . Coronary artery disease Mother   . Hypertension Mother   . Melanoma Mother   . Cancer Mother 67    breast  . Cancer Father 58    prostate  . Cancer Sister 43    breast  . Cancer Maternal Aunt 34    breast  . Cancer Sister 45    breast     GENETIC COUNSELING/TESTING: Yes, performed 07/28/2013: Genetic counseling/testing: BreastNext gene panel negative. Genes tested include: ATM, BARD1, BRCA1/2, BRIP1, CDH1, CHEK2, MRE11A, MUTYH, NBN, NF1, PALB2,  PTEN, RAD50, RAD51C, RAD51D, and TP53.   SOCIAL HISTORY:  CALENE PARADISO is married and lives with her spouse in [place], [place].  She has 2 children. 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  She is a former smoker (quit in 1965) and denies alcohol or illicit drug use.     PHYSICAL EXAMINATION:  Vital Signs: Filed Vitals:   05/24/15 1118  BP: 139/73  Pulse: 55  Temp: 98.4 F (36.9 C)  Resp: 17   Weight: 208.2 (stable) ECOG performance status: 1 General: Well-nourished, well-appearing female in no acute distress.  She is accompanied in clinic by her husband today.   HEENT: Head is atraumatic and normocephalic.  Pupils equal and reactive to light and accomodation. Conjunctivae clear without exudate.  Sclerae anicteric. Oral mucosa is pink, moist, and intact without lesions.  Oropharynx is pink without lesions or erythema.  Lymph: No cervical, supraclavicular, infraclavicular, or axillary lymphadenopathy  noted on palpation.  Cardiovascular: Regular rate and rhythm without murmurs, rubs, or gallops. Respiratory: Clear to auscultation bilaterally. Chest expansion symmetric without accessory muscle use on inspiration or expiration.  Breast: Bilateral breast exam performed.  Left lumpectomy scar intact without nodularity. Slightly tender to palpation at 1030 without palpable mass. Right lumpectomy scar intact without nodularity.  No mass or lesion in either breast. GI: Abdomen soft and round. No tenderness to palpation. Bowel sounds normoactive in 4 quadrants. No hepatosplenomegaly.   GU: Deferred.  Musculoskeletal: Muscle strength 5/5 in all extremities.   Neuro: No focal deficits. Steady gait.  Psych: Mood and affect normal and appropriate for situation.  Extremities: No edema, cyanosis, or clubbing.  Skin: Scattered moles across trunk.  No suspicious lesions. Warm and dry. No open lesions noted.   LABORATORY DATA:  Recent Results (from the past 2160 hour(s))  CBC with  Differential     Status: None   Collection Time: 05/24/15 11:03 AM  Result Value Ref Range   WBC 5.5 3.9 - 10.3 10e3/uL   NEUT# 3.2 1.5 - 6.5 10e3/uL   HGB 14.5 11.6 - 15.9 g/dL   HCT 42.0 34.8 - 46.6 %   Platelets 228 145 - 400 10e3/uL   MCV 91.9 79.5 - 101.0 fL   MCH 31.7 25.1 - 34.0 pg   MCHC 34.5 31.5 - 36.0 g/dL   RBC 4.57 3.70 - 5.45 10e6/uL   RDW 13.5 11.2 - 14.5 %   lymph# 1.6 0.9 - 3.3 10e3/uL   MONO# 0.6 0.1 - 0.9 10e3/uL   Eosinophils Absolute 0.1 0.0 - 0.5 10e3/uL   Basophils Absolute 0.0 0.0 - 0.1 10e3/uL   NEUT% 58.8 38.4 - 76.8 %   LYMPH% 28.5 14.0 - 49.7 %   MONO% 10.0 0.0 - 14.0 %   EOS% 2.2 0.0 - 7.0 %   BASO% 0.5 0.0 - 2.0 %  Comprehensive metabolic panel     Status: Abnormal   Collection Time: 05/24/15 11:03 AM  Result Value Ref Range   Sodium 139 136 - 145 mEq/L   Potassium 4.2 3.5 - 5.1 mEq/L   Chloride 102 98 - 109 mEq/L   CO2 29 22 - 29 mEq/L   Glucose 109 70 - 140 mg/dl    Comment: Glucose reference range is for nonfasting patients. Fasting glucose reference range is 70- 100.   BUN 11.9 7.0 - 26.0 mg/dL   Creatinine 0.9 0.6 - 1.1 mg/dL   Total Bilirubin 0.71 0.20 - 1.20 mg/dL   Alkaline Phosphatase 68 40 - 150 U/L   AST 14 5 - 34 U/L   ALT 13 0 - 55 U/L   Total Protein 6.9 6.4 - 8.3 g/dL   Albumin 3.8 3.5 - 5.0 g/dL   Calcium 9.6 8.4 - 10.4 mg/dL   Anion Gap 8 3 - 11 mEq/L   EGFR 58 (L) >90 ml/min/1.73 m2    Comment: eGFR is calculated using the CKD-EPI Creatinine Equation (2009)       ASSESSMENT AND PLAN:   1. History of breast cancer: Stage 0 ductal carcinoma in situ of the right breast (03/2008), ER positive, PR positive, S/P lumpectomy (04/2008) followed by adjuvant radiation therapy to the right breast (completed 06/2008) followed by five years of anastrozole (completed 06/2013), S/P left lumpectomy for benign complex sclerosing lesion (09/2014), now followed in a program of surveillance.  Ms. Munshi is doing well with no clinical symptoms  worrisome for cancer recurrence at this time. Labs stable.  She has recovered well following her recent lumpectomy and will continue follow up with Dr. Marlou Starks. I have reviewed the recommendations for ongoing surveillance with her and she will follow-up with her medical oncologist,  Dr. [name], in November 2017 with history and physical exam per surveillance protocol and will return to see [place] in one year's time (May 2018) here in Survivorship.  She will be due mammography in July 2017 and I have entered orders for this following today's appointment. She was instructed to make [place] aware if she notes any change within her breasts, any new symptoms such as pain, shortness of breath, weight loss, or fatigue.    2. Bone health: Ms. Bosak is considering the initiation of denosumab to help manage her osteoporosis. She is no longer on aromatase inhibitor therapy, however, likely did experience bone demineralization due to therapy in addition to the changes in her bone density secondary to menopause.  I have encouraged her to strongly consider it and discuss further with Dr. Laurann Montana.  We reviewed potential side effects and she will consider.  3. Cancer screening:  Due to Ms. Dettman's history and her age, she should receive screening for skin cancer.  She is S/P hysterectomy.  She will see Dr. Allyson Sabal next week in dermatology. The information and recommendations were shared with the patient and in her written after visit summary.  4. Health maintenance and wellness promotion:Ms. Rosa and I discussed recommendations to maximize nutrition and minimize recurrence, such as increased intake of fruits, vegetables, lean proteins, and minimizing the intake of red meats and processed foods.  She was also encouraged to engage in moderate to vigorous exercise for 30 minutes per day most days of the week. She was instructed to limit her alcohol consumption and continue to abstain from tobacco use.   5. Support services/counseling:  Ms. Horney was offered support today through active listening and expressive supportive counseling.  She was given information regarding our available services and encouraged to contact me with any questions or for help enrolling in any of our support group/programs.   A total of 30 minutes of face-to-face time was spent with this patient with greater than 50% of that time in counseling and care-coordination.   Sylvan Cheese, NP  Survivorship Program [facility] [phone]   Note: PRIMARY CARE PROVIDER Osborne Casco, [place] [phone]

## 2015-06-12 ENCOUNTER — Encounter: Payer: Self-pay | Admitting: Adult Health

## 2015-06-12 NOTE — Progress Notes (Signed)
A birthday card was mailed to the patient today on behalf of the Survivorship Program at [facility].   Areya Lemmerman, NP Survivorship Program [facility] [phone]  

## 2015-06-19 ENCOUNTER — Ambulatory Visit
Admission: RE | Admit: 2015-06-19 | Discharge: 2015-06-19 | Disposition: A | Discharge status: Home / Self Care | Payer: Medicare Other | Source: Ambulatory Visit | Attending: Physician Assistant | Admitting: Physician Assistant

## 2015-06-19 ENCOUNTER — Other Ambulatory Visit: Payer: Self-pay | Admitting: Physician Assistant

## 2015-06-19 DIAGNOSIS — R55 Syncope and collapse: Secondary | ICD-10-CM

## 2015-06-25 ENCOUNTER — Other Ambulatory Visit: Payer: Self-pay | Admitting: Family Medicine

## 2015-06-25 DIAGNOSIS — R0989 Other specified symptoms and signs involving the circulatory and respiratory systems: Secondary | ICD-10-CM

## 2015-06-28 ENCOUNTER — Ambulatory Visit (INDEPENDENT_AMBULATORY_CARE_PROVIDER_SITE_OTHER): Payer: Medicare Other | Admitting: Physician Assistant

## 2015-06-28 ENCOUNTER — Encounter: Payer: Self-pay | Admitting: Physician Assistant

## 2015-06-28 VITALS — BP 134/84 | HR 52 | Ht 62.5 in | Wt 207.4 lb

## 2015-06-28 DIAGNOSIS — I2510 Atherosclerotic heart disease of native coronary artery without angina pectoris: Secondary | ICD-10-CM

## 2015-06-28 DIAGNOSIS — R42 Dizziness and giddiness: Secondary | ICD-10-CM

## 2015-06-28 DIAGNOSIS — I10 Essential (primary) hypertension: Secondary | ICD-10-CM

## 2015-06-28 DIAGNOSIS — E039 Hypothyroidism, unspecified: Secondary | ICD-10-CM

## 2015-06-28 DIAGNOSIS — E785 Hyperlipidemia, unspecified: Secondary | ICD-10-CM | POA: Diagnosis not present

## 2015-06-28 DIAGNOSIS — R55 Syncope and collapse: Secondary | ICD-10-CM

## 2015-06-28 NOTE — Patient Instructions (Addendum)
Medication Instructions:  Your physician recommends that you continue on your current medications as directed. Please refer to the Current Medication list given to you today.   Labwork: None ordered  Testing/Procedures: Your physician has recommended that you wear an event monitor. Event monitors are medical devices that record the heart's electrical activity. Doctors most often [place] these monitors to diagnose arrhythmias. Arrhythmias are problems with the speed or rhythm of the heartbeat. The monitor is a small, portable device. You can wear one while you do your normal daily activities. This is usually used to diagnose what is causing palpitations/syncope (passing out).    Follow-Up: Your physician recommends that you schedule a follow-up appointment in:  [place] DR. CRENSHAW  OR NP/PA ON THE SAME DAY DR. Stanford Breed IS IN THE OFFICE.   Any Other Special Instructions Will Be Listed Below (If Applicable). Cardiac Event Monitoring A cardiac event monitor is a small recording device used to help detect abnormal heart rhythms (arrhythmias). The monitor is used to record heart rhythm when noticeable symptoms such as the following occur:  Fast heartbeats (palpitations), such as heart racing or fluttering.  Dizziness.  Fainting or light-headedness.  Unexplained weakness. The monitor is wired to two electrodes placed on your chest. Electrodes are flat, sticky disks that attach to your skin. The monitor can be worn for up to 30 days. You will wear the monitor at all times, except when bathing.  HOW TO USE YOUR CARDIAC EVENT MONITOR A technician will prepare your chest for the electrode placement. The technician will show you how to place the electrodes, how to work the monitor, and how to replace the batteries. Take time to practice using the monitor before you leave the office. Make sure you understand how to send the information from the monitor to your health care provider. This requires a  telephone with a landline, not a cell phone. You need to:  Wear your monitor at all times, except when you are in water:  Do not get the monitor wet.  Take the monitor off when bathing. Do not swim or use a hot tub with it on.  Keep your skin clean. Do not put body lotion or moisturizer on your chest.  Change the electrodes daily or any time they stop sticking to your skin. You might need to use tape to keep them on.  It is possible that your skin under the electrodes could become irritated. To keep this from happening, try to put the electrodes in slightly different places on your chest. However, they must remain in the area under your left breast and in the upper right section of your chest.  Make sure the monitor is safely clipped to your clothing or in a location close to your body that your health care provider recommends.  Press the button to record when you feel symptoms of heart trouble, such as dizziness, weakness, light-headedness, palpitations, thumping, shortness of breath, unexplained weakness, or a fluttering or racing heart. The monitor is always on and records what happened slightly before you pressed the button, so do not worry about being too late to get good information.  Keep a diary of your activities, such as walking, doing chores, and taking medicine. It is especially important to note what you were doing when you pushed the button to record your symptoms. This will help your health care provider determine what might be contributing to your symptoms. The information stored in your monitor will be reviewed by your health  care provider alongside your diary entries.  Send the recorded information as recommended by your health care provider. It is important to understand that it will take some time for your health care provider to process the results.  Change the batteries as recommended by your health care provider. SEEK IMMEDIATE MEDICAL CARE IF:   You have chest  pain.  You have extreme difficulty breathing or shortness of breath.  You develop a very fast heartbeat that persists.  You develop dizziness that does not go away.  You faint or constantly feel you are about to faint.   This information is not intended to replace advice given to you by your health care provider. Make sure you discuss any questions you have with your health care provider.   Document Released: 10/08/2007 Document Revised: 01/19/2014 Document Reviewed: 06/27/2012 Elsevier Interactive Patient Education [employer].     If you need a refill on your cardiac medications before your next appointment, please call your pharmacy.

## 2015-06-28 NOTE — Progress Notes (Signed)
CARDIOLOGY OFFICE NOTE  Date:  06/28/2015    Carmen Haney Date of Birth: 80/26/1932 Medical Record [id number]  PCP:  Carmen Casco, MD  Cardiologist:  Dr. Stanford Haney    Chief Complaint  Patient presents with  . Follow-up    seen for Dr. Stanford Haney    History of Present Illness: Carmen Haney is a 82 y.o. female who presents today for cardiology office visit for presyncope. She has past medical history of CAD, HTN, breast CA s/p lumpectomy and radiation (diagnosed in 02/2008, lupectomy 04/2008, completed 5 years of anti-estrogen therapy by 2015), HLD and hypothyroidism. She had a cardiac catheterization in 2002 which showed 40% left main. Her last carotid artery ultrasound obtained on 08/22/2013 showed normal carotid artery, patent vertebral artery with antegrade flow, normal subclavian artery. Myoview obtained of the same day showed EF 64%, risk study with small mild fixed apical anterior perfusion defect, no ischemia. It appears she recently had a CT of head without contrast on 06/19/2015 which did not reveal any acute etiology.  Patient was referred today by her PCP for evaluation of syncope and continuous episode of dizzy spells. According to the patient, she had a syncope episode in the morning of 6/7. She says normally she gets up at night. However that night she does not remember getting up. She woke up next morning on the floor next to the bed. Since then, she has been very nervous. She has been noticing "fuzzy headache", jaw discomfort and flushing sensation. She also noticed occasional tachycardia episodes and stomach queasiness. Sometimes the symptoms seems to be more situational like when her husband leaves her in the room and she feels locked in. Other times it could be spontaneous. She does have occasional slow heart rate, but during the episode she sometimes feels her heart racing. The episodes may not occur on a daily basis.   After discussing various options,  alternative explanation for syncope could be vasovagal episode and orthostatic hypotension. Although her PCPs office noted her orthostatic vital sign is normal. There is also a question whether or not she truly passed out. Her subsequent symptoms seems to be more emotional/panic attack in nature. I would like to rule out underlying arrhythmia. Her PCP is obtaining another carotid ultrasound which I think is likely be normal given previous image, but i think it is reasonable. I will obtain a 30 day event monitor and have instructed her to manually trigger an event if she is having symptoms again. We can then see if she is having fast heart rhythm or bradycardia. If her monitor shows normal rhythm with normal heart rate during the event, I am more inclined to believe her subsequent symptoms of flushing sensation and fuzzy headache is likely related to panic attack. She can follow-up with Dr. Jacalyn Lefevre office in 6 weeks.    Past Medical History  Diagnosis Date  . CAD (coronary artery disease) 07/1998    s/p cath in 2000. L main with 30 to 40%  . Obesities, morbid ([place])   . HTN (hypertension)   . Breast CA (Carmen Haney)     s/p lumpectomy & radiation  . Hyperlipidemia   . ACE-inhibitor cough   . Thyroid disease   . Osteoporosis   . Hearing loss     Past Surgical History  Procedure Laterality Date  . Breast lumpectomy  March 2010  . Cesarean section    . Total abdominal hysterectomy    . Breast lumpectomy with radioactive seed localization  Left 09/20/2014    Procedure: LEFT BREAST LUMPECTOMY WITH RADIOACTIVE SEED LOCALIZATION;  Surgeon: Carmen Messing III, MD;  Location: [facility];  Service: General;  Laterality: Left;     Medications: Current Outpatient Prescriptions  Medication Sig Dispense Refill  . aspirin 81 MG tablet Take 81 mg by mouth daily.      Marland Kitchen atenolol (TENORMIN) 25 MG tablet Take 1 tablet (25 mg total) by mouth daily. 90 tablet 3  . Calcium Carbonate-Vitamin D (CALCIUM + D  PO) Take by mouth 2 (two) times daily.      . furosemide (LASIX) 20 MG tablet TAKE 1 TABLET BY MOUTH DAILY. 30 tablet 11  . levothyroxine (SYNTHROID, LEVOTHROID) 75 MCG tablet Take 75 mcg by mouth daily.      Marland Kitchen losartan (COZAAR) 50 MG tablet Take 1 tablet (50 mg total) by mouth daily. 90 tablet 3  . simvastatin (ZOCOR) 10 MG tablet Take 10 mg by mouth daily at 6 PM.     . [DISCONTINUED] olmesartan (BENICAR) 20 MG tablet Take 20 mg by mouth daily.       No current facility-administered medications for this visit.    Allergies: Allergies  Allergen Reactions  . Ace Inhibitors Cough  . Latex Rash    Social History: The patient  reports that she quit smoking about 52 years ago. Her smoking use included Cigarettes. She has never used smokeless tobacco. She reports that she does not drink alcohol or use illicit drugs.   Family History: The patient's family history includes Cancer (age of onset: 25) in her sister; Cancer (age of onset: 67) in her maternal aunt, mother, and sister; Cancer (age of onset: 12) in her father; Coronary artery disease in her mother; Hypertension in her mother; Melanoma in her mother.   Review of Systems: Please see the history of present illness.   Otherwise, the review of systems is positive for syncope, dizziness, flushing sensation, headache, stomach queasiness, palpitation.   All other systems are reviewed and negative.   Physical Exam: VS:  BP 134/84 mmHg  Pulse 52  Ht 5' 2.5" (1.588 m)  Wt 207 lb 6.4 oz (94.076 kg)  BMI 37.31 kg/m2  SpO2 98% .  BMI Body mass index is 37.31 kg/(m^2).  Wt Readings from Last 3 Encounters:  06/28/15 207 lb 6.4 oz (94.076 kg)  05/24/15 208 lb 3.2 oz (94.439 kg)  12/14/14 207 lb 3.2 oz (93.985 kg)    General: Pleasant. Well developed, well nourished and in no acute distress.  HEENT: Normal. Neck: Supple, no JVD, carotid bruits, or masses noted.  Cardiac: Regular rate and rhythm. No murmurs, rubs, or gallops. No edema.    Respiratory:  Lungs are clear to auscultation bilaterally with normal work of breathing.  GI: Soft and nontender.  MS: No deformity or atrophy. Gait and ROM intact. Skin: Warm and dry. Color is normal.  Neuro:  Strength and sensation are intact and no gross focal deficits noted.  Psych: Alert, appropriate and with normal affect.   LABORATORY DATA:  EKG:  EKG is not ordered today.   Lab Results  Component Value Date   WBC 5.5 05/24/2015   HGB 14.5 05/24/2015   HCT 42.0 05/24/2015   PLT 228 05/24/2015   GLUCOSE 109 05/24/2015   ALT 13 05/24/2015   AST 14 05/24/2015   NA 139 05/24/2015   K 4.2 05/24/2015   CL 97* 09/14/2014   CREATININE 0.9 05/24/2015   BUN 11.9 05/24/2015  CO2 29 05/24/2015     Other Studies Reviewed Today:  Carotid U/S 08/22/2013 Normal carotid arteries, bilaterally. Patent vertebral arteries with antegrade flow. Normal subclavian arteries, bilaterally.   Myoview 08/22/2013 Overall Impression: Low risk stress nuclear study with a small, mild fixed apical anterior perfusion defect. Given normal wall motion, suspect attenuation. No ischemia. .  LV Ejection Fraction: 64%. LV Wall Motion: NL LV Function; NL Wall Motion    CT of head without contrast 06/19/2015 No acute intracranial pathology.    Assessment/Plan:  1. Syncope/presyncope  - some of which sounds situational like panic attack, but given palpitation, will obtain 30 day event monitor. Her EF was normal on myoview in 2015. If arrhythmia noted on event monitor, may consider echo. Otherwise, treat underlying anxiety  - lab work from [facility] office shows no electrolyte imbalance  2. CAD, cardiac catheterization in 2002 which showed 40% left main.  - she has no CP, doubt ischemia. Recent EKG 06/25/2015 from Dr. Delene Ruffini office reviewed, no sign of ischemia  3. HTN: controlled on atenolol and losartan. Since I do not know if bradycardia or tachycardia that's causing the issue, will not  change atenolol at this time  4. breast CA s/p lumpectomy and radiation (diagnosed in 02/2008, lupectomy 04/2008, completed 5 years of anti-estrogen therapy by 2015)  5. HLD: on zocor  6. Hypothyroidism: on synthroid   Family and patient had a lot of question regarding her recent symptom, I have answered them all to their satisfaction. Total visitation time >4min   Current medicines are reviewed with the patient today.  The patient does not have concerns regarding medicines other than what has been noted above.  The following changes have been made:  See above.  Labs/ tests ordered today include:    Orders Placed This Encounter  Procedures  . Cardiac event monitor     Disposition:   FU with Dr. Stanford Haney in 6 weeks.   Patient is agreeable to this plan and will call if any problems develop in the interim.   Signed: Almyra Deforest PA-C 06/28/2015 7:57 PM  [facility] [address] [place], [place]  [address] Phone: [phone] Fax: [phone]

## 2015-07-02 ENCOUNTER — Ambulatory Visit
Admission: RE | Admit: 2015-07-02 | Discharge: 2015-07-02 | Disposition: A | Discharge status: Home / Self Care | Payer: Medicare Other | Source: Ambulatory Visit | Attending: Family Medicine | Admitting: Family Medicine

## 2015-07-02 ENCOUNTER — Ambulatory Visit (INDEPENDENT_AMBULATORY_CARE_PROVIDER_SITE_OTHER): Payer: Medicare Other

## 2015-07-02 DIAGNOSIS — R55 Syncope and collapse: Secondary | ICD-10-CM | POA: Diagnosis not present

## 2015-07-02 DIAGNOSIS — R0989 Other specified symptoms and signs involving the circulatory and respiratory systems: Secondary | ICD-10-CM

## 2015-07-02 DIAGNOSIS — R42 Dizziness and giddiness: Secondary | ICD-10-CM

## 2015-07-07 ENCOUNTER — Emergency Department (HOSPITAL_COMMUNITY): Payer: Medicare Other

## 2015-07-07 ENCOUNTER — Other Ambulatory Visit: Payer: Self-pay

## 2015-07-07 ENCOUNTER — Emergency Department (HOSPITAL_COMMUNITY)
Admission: EM | Admit: 2015-07-07 | Discharge: 2015-07-08 | Disposition: A | Discharge status: Home / Self Care | Payer: Medicare Other | Attending: Emergency Medicine | Admitting: Emergency Medicine

## 2015-07-07 ENCOUNTER — Encounter (HOSPITAL_COMMUNITY): Payer: Self-pay | Admitting: Family Medicine

## 2015-07-07 DIAGNOSIS — E785 Hyperlipidemia, unspecified: Secondary | ICD-10-CM | POA: Diagnosis not present

## 2015-07-07 DIAGNOSIS — I10 Essential (primary) hypertension: Secondary | ICD-10-CM | POA: Diagnosis not present

## 2015-07-07 DIAGNOSIS — I2510 Atherosclerotic heart disease of native coronary artery without angina pectoris: Secondary | ICD-10-CM | POA: Diagnosis not present

## 2015-07-07 DIAGNOSIS — Z7982 Long term (current) use of aspirin: Secondary | ICD-10-CM | POA: Diagnosis not present

## 2015-07-07 DIAGNOSIS — E079 Disorder of thyroid, unspecified: Secondary | ICD-10-CM | POA: Insufficient documentation

## 2015-07-07 DIAGNOSIS — Z853 Personal history of malignant neoplasm of breast: Secondary | ICD-10-CM | POA: Diagnosis not present

## 2015-07-07 DIAGNOSIS — F430 Acute stress reaction: Secondary | ICD-10-CM | POA: Diagnosis not present

## 2015-07-07 DIAGNOSIS — Z79899 Other long term (current) drug therapy: Secondary | ICD-10-CM | POA: Diagnosis not present

## 2015-07-07 DIAGNOSIS — Z87891 Personal history of nicotine dependence: Secondary | ICD-10-CM | POA: Diagnosis not present

## 2015-07-07 DIAGNOSIS — F410 Panic disorder [episodic paroxysmal anxiety] without agoraphobia: Secondary | ICD-10-CM | POA: Diagnosis present

## 2015-07-07 HISTORY — DX: Pathological fracture, other site, initial encounter for fracture: M84.48XA

## 2015-07-07 LAB — I-STAT TROPONIN, ED: TROPONIN I, POC: 0.02 ng/mL (ref 0.00–0.08)

## 2015-07-07 NOTE — ED Notes (Signed)
Bed: WA08 Expected date:  Expected time:  Means of arrival:  Comments: 82 yo F  Panic attack

## 2015-07-07 NOTE — ED Notes (Signed)
Patient is from home and transported via [facility]. Patient reported to EMS that she had a panic attack. She took Lorazepam 0.5mg  PO before calling EMS. Pt has been wearing a Holter heart monitor for the last two weeks due to experiencing syncopal episodes. Pt is denying a syncopal episode tonight or experiencing chest pain.

## 2015-07-07 NOTE — ED Notes (Signed)
Patient in radiology

## 2015-07-08 LAB — BASIC METABOLIC PANEL
Anion gap: 5 (ref 5–15)
BUN: 12 mg/dL (ref 6–20)
CO2: 31 mmol/L (ref 22–32)
CREATININE: 0.97 mg/dL (ref 0.44–1.00)
Calcium: 8.9 mg/dL (ref 8.9–10.3)
Chloride: 100 mmol/L — ABNORMAL LOW (ref 101–111)
GFR calc non Af Amer: 53 mL/min — ABNORMAL LOW (ref >60)
Glucose, Bld: 111 mg/dL — ABNORMAL HIGH (ref 65–99)
Potassium: 4 mmol/L (ref 3.5–5.1)
SODIUM: 136 mmol/L (ref 135–145)

## 2015-07-08 LAB — CBC WITH DIFFERENTIAL/PLATELET
BASOS PCT: 0 %
Basophils Absolute: 0 10*3/uL (ref 0.0–0.1)
EOS ABS: 0.1 10*3/uL (ref 0.0–0.7)
EOS PCT: 2 %
HCT: 41 % (ref 36.0–46.0)
HEMOGLOBIN: 14.2 g/dL (ref 12.0–15.0)
Lymphocytes Relative: 21 %
Lymphs Abs: 1.6 10*3/uL (ref 0.7–4.0)
MCH: 31.9 pg (ref 26.0–34.0)
MCHC: 34.6 g/dL (ref 30.0–36.0)
MCV: 92.1 fL (ref 78.0–100.0)
MONOS PCT: 11 %
Monocytes Absolute: 0.9 10*3/uL (ref 0.1–1.0)
NEUTROS PCT: 66 %
Neutro Abs: 5 10*3/uL (ref 1.7–7.7)
PLATELETS: 236 10*3/uL (ref 150–400)
RBC: 4.45 MIL/uL (ref 3.87–5.11)
RDW: 13.7 % (ref 11.5–15.5)
WBC: 7.5 10*3/uL (ref 4.0–10.5)

## 2015-07-08 MED ORDER — LORAZEPAM 0.5 MG PO TABS
0.5000 mg | ORAL_TABLET | Freq: Once | ORAL | Status: AC
  Administered 2015-07-08: 0.5 mg via ORAL
  Filled 2015-07-08: qty 1

## 2015-07-08 NOTE — ED Provider Notes (Signed)
CSN: [id number]     Arrival date & time 07/07/15  2149 History   First MD Initiated Contact with Patient 07/07/15 2214     Chief Complaint  Patient presents with  . Panic Attack     (Consider location/radiation/quality/duration/timing/severity/associated sxs/prior Treatment) HPI Comments: 82 year old female with history of hypertension, coronary artery disease, breast cancer presents for an episode of possible panic attack. The patient reports that she had a syncopal episode 2 weeks ago. She has been being worked up for this outpatient including with CT imaging of her head as well as cardiology evaluation and is currently on a Holter monitor. She states that she has been so anxious and nervous about having another syncopal episode that she even makes her husband come in to the bathroom and standing outside the shower while she is showering. She says from time to time when she moves her head quickly she will feels slightly off balance. She denies any current headache. She reports tonight she was thinking about the fact that her grandson was traveling and driving by himself and started to become very nervous. She says that she's had these episodes but not this bad and has been prescribed Ativan for these episodes from her primary care physician. She reports that she started to have abdominal discomfort that then seemed to travel up her chest and into her jaw. She states that she was clenching her teeth so hard that her teeth now hurt. She took her dose of Ativan at home but did not feel better right away. Because this episode was so severe and she did not get better right away with the Ativan EMS was called. By the time she arrived to the emergency department she was feeling much better.   Past Medical History  Diagnosis Date  . CAD (coronary artery disease) 07/1998    s/p cath in 2000. L main with 30 to 40%  . Obesities, morbid ([place])   . HTN (hypertension)   . Breast CA ([place])     s/p lumpectomy &  radiation  . Hyperlipidemia   . ACE-inhibitor cough   . Thyroid disease   . Osteoporosis   . Hearing loss   . Vertebral fracture, pathological    Past Surgical History  Procedure Laterality Date  . Breast lumpectomy  March 2010  . Cesarean section    . Total abdominal hysterectomy    . Breast lumpectomy with radioactive seed localization Left 09/20/2014    Procedure: LEFT BREAST LUMPECTOMY WITH RADIOACTIVE SEED LOCALIZATION;  Surgeon: Autumn Messing III, MD;  Location: [facility];  Service: General;  Laterality: Left;  . Carpal tunnel release Right   . Skin cancer removed from right forearm    . Eye surgery      Right Eye   Family History  Problem Relation Age of Onset  . Coronary artery disease Mother   . Hypertension Mother   . Melanoma Mother   . Cancer Mother 93    breast  . Cancer Father 52    prostate  . Cancer Sister 32    breast  . Cancer Maternal Aunt 15    breast  . Cancer Sister 73    breast   Social History  Substance Use Topics  . Smoking status: Former Smoker    Types: Cigarettes    Quit date: 01/13/1963  . Smokeless tobacco: Never Used     Comment: stopped in 1970's  . Alcohol Use: No   OB History  No data available     Review of Systems  Constitutional: Negative for fever, chills and fatigue.  HENT: Negative for congestion, postnasal drip and rhinorrhea.        Jaw pain  Eyes: Negative for visual disturbance.  Respiratory: Negative for cough, chest tightness and shortness of breath.   Cardiovascular: Negative for chest pain, palpitations and leg swelling.  Gastrointestinal: Positive for abdominal pain. Negative for nausea, vomiting, diarrhea and constipation.  Genitourinary: Negative for dysuria, urgency and flank pain.  Musculoskeletal: Negative for myalgias and back pain.  Skin: Negative for rash.  Neurological: Negative for dizziness, weakness and light-headedness.  Hematological: Does not bruise/bleed easily.       Allergies  Ace inhibitors and Latex  Home Medications   Prior to Admission medications   Medication Sig Start Date End Date Taking? Authorizing Provider  aspirin 81 MG tablet Take 81 mg by mouth daily.     Yes Historical Provider, MD  atenolol (TENORMIN) 25 MG tablet Take 1 tablet (25 mg total) by mouth daily. 12/17/14  Yes Lelon Perla, MD  Calcium Carbonate-Vitamin D (CALCIUM + D PO) Take by mouth 2 (two) times daily.     Yes Historical Provider, MD  furosemide (LASIX) 20 MG tablet TAKE 1 TABLET BY MOUTH DAILY. 01/11/15  Yes Lelon Perla, MD  levothyroxine (SYNTHROID, LEVOTHROID) 75 MCG tablet Take 75 mcg by mouth daily.     Yes Historical Provider, MD  LORazepam (ATIVAN) 0.5 MG tablet Take 1 tablet by mouth daily. 06/28/15  Yes Historical Provider, MD  losartan (COZAAR) 50 MG tablet Take 1 tablet (50 mg total) by mouth daily. 12/17/14  Yes Lelon Perla, MD  simvastatin (ZOCOR) 10 MG tablet Take 10 mg by mouth daily at 6 PM.    Yes Historical Provider, MD   BP 136/116 mmHg  Pulse 68  Temp(Src) 97.9 F (36.6 C) (Oral)  Resp 22  Ht 5\' 3"  (1.6 m)  Wt 207 lb (93.895 kg)  BMI 36.68 kg/m2  SpO2 98% Physical Exam  Constitutional: She is oriented to person, place, and time. She appears well-developed and well-nourished. No distress.  HENT:  Head: Normocephalic and atraumatic.  Right Ear: External ear normal.  Left Ear: External ear normal.  Nose: Nose normal.  Mouth/Throat: Oropharynx is clear and moist. No oropharyngeal exudate.  Eyes: EOM are normal. Pupils are equal, round, and reactive to light. Right eye exhibits normal extraocular motion and no nystagmus. Left eye exhibits normal extraocular motion and no nystagmus.  Neck: Normal range of motion. Neck supple.  Cardiovascular: Normal rate, regular rhythm, normal heart sounds and intact distal pulses.   No murmur heard. Pulmonary/Chest: Effort normal. No respiratory distress. She has no wheezes. She has no rales.   Abdominal: Soft. She exhibits no distension. There is no tenderness.  Musculoskeletal: Normal range of motion. She exhibits no edema or tenderness.  Neurological: She is alert and oriented to person, place, and time. She has normal strength. No cranial nerve deficit or sensory deficit. She exhibits normal muscle tone. Coordination normal.  Skin: Skin is warm and dry. No rash noted. She is not diaphoretic.  Vitals reviewed.   ED Course  Procedures (including critical care time) Labs Review Labs Reviewed  BASIC METABOLIC PANEL - Abnormal; Notable for the following:    Chloride 100 (*)    Glucose, Bld 111 (*)    GFR calc non Af Amer 53 (*)    All other components within normal limits  CBC  WITH DIFFERENTIAL/PLATELET  Randolm Idol, ED    Imaging Review Dg Chest 2 View  07/08/2015  CLINICAL DATA:  Syncopal episode today. Some dyspnea and heavy sensation in the chest. EXAM: CHEST  2 VIEW COMPARISON:  10/25/2009 FINDINGS: The lungs are clear except for minimal linear scarring, stable. Borderline heart size, unchanged. Unremarkable hilar and mediastinal contours. Normal pulmonary vasculature. No effusions. There are compressions of T8 and T12, chronic although the T8 compression appears slightly worsened from 01/15/2014. IMPRESSION: 1. No acute cardiopulmonary findings. Unchanged borderline heart size. 2. The T8 compression is mildly worsened from 01/15/2014 although this is more likely chronic. Unchanged T12 compression. Electronically Signed   By: Andreas Newport M.D.   On: 07/08/2015 00:18   I have personally reviewed and evaluated these images and lab results as part of my medical decision-making.   EKG Interpretation   Date/Time:  Sunday July 07 2015 23:52:51 EDT Ventricular Rate:  54 PR Interval:    QRS Duration: 106 QT Interval:  456 QTC Calculation: 433 R Axis:   -44 Text Interpretation:  Sinus rhythm Left anterior fascicular block Low  voltage, precordial leads Abnormal  R-wave progression, late transition No  significant change since last tracing Confirmed by NGUYEN, EMILY ([id number])  on 07/08/2015 12:14:04 AM      MDM  Patient was seen and evaluated in stable condition. Patient well-appearing. Reviewed results from work up outpatient in chart. [facility] cardiology note as well. History sounds consistent with likely stress reaction or panic attack. Laboratory studies including troponin were unremarkable. EKG unremarkable. Chest x-ray without acute finding. All of this was discussed at length with patient and her family at bedside. She was given another dose of 0.5 of Ativan. She ambulated in the hallway without difficulty. She continued to be mildly anxious and nervous. After lengthy discussion with her family and her at bedside plan for patient to be discharged with close follow-up with her primary care physician and with list of outpatient resources. Final diagnoses:  None    1. Stress reaction    Harvel Quale, MD 07/08/15 [phone]

## 2015-07-08 NOTE — ED Notes (Signed)
Pt ambulated to bathroom and back while holding husband's hand.  Tolerated well.

## 2015-07-08 NOTE — Discharge Instructions (Signed)
You were seen and evaluated tonight for your episode of feeling extremely anxious and for the clenching of your teeth in your upset stomach. It seems like this is likely a stress reaction. Please follow-up closely with your primary care physician. When you get home if you start to develop symptoms again you can take 1 more dose of your 0.5 mg Ativan.  Panic Attacks Panic attacks are sudden, short feelings of great fear or discomfort. You may have them for no reason when you are relaxed, when you are uneasy (anxious), or when you are sleeping.  HOME CARE  Take all your medicines as told.  Check with your doctor before starting new medicines.  Keep all doctor visits. GET HELP IF:  You are not able to take your medicines as told.  Your symptoms do not get better.  Your symptoms get worse. GET HELP RIGHT AWAY IF:  Your attacks seem different than your normal attacks.  You have thoughts about hurting yourself or others.  You take panic attack medicine and you have a side effect. MAKE SURE YOU:  Understand these instructions.  Will watch your condition.  Will get help right away if you are not doing well or get worse.   This information is not intended to replace advice given to you by your health care provider. Make sure you discuss any questions you have with your health care provider.   Document Released: 01/31/2010 Document Revised: 10/19/2012 Document Reviewed: 08/12/2012 Elsevier Interactive Patient Education 2016 [facility] [employer] 211 is a great source of information about community services available.  Access by dialing 2-1-1 from anywhere in [place], or by website -  CustodianSupply.fi.   Other Local Resources (Updated 01/2015)  [employer]  Crisis Hotline, available 24 hours a day, 7 days a week:  [place], [place]   [facility]  Crisis Hotline, available 24 hours a day, 7 days a week: [place], [place]  [facility]  Suicide Prevention Hotline, available 24 hours a day, 7 days a week: [place], [place], available 24 hours a day, 7 days a week: [place], [place] Access to [employer], available 24 hours a day, 7 days a week: 906-004-3607 All   [facility]  Crisis Hotline, available 24 hours a day, 7 days a week: 716-305-6377 All   Other Local Resources (Updated 01/2015)  Outpatient Counseling/ Substance Abuse Programs  Services     Address and Phone Number  ADS (Alcohol and Drug Services)   Options include Individual counseling, group counseling, intensive outpatient program (several hours a day, several days a week)  Offers depression assessments  Provides methadone maintenance program [phone] [address], [place], [place] partial hospitalization/day treatment and DUI/DWI programs  [employer], private insurance [phone] [address], [phone] [place], [place] [address]  [address] include intensive outpatient program (several hours a day, several days a week), outpatient treatment, DUI/DWI services, family education  Also has some services specifically for [employer] transitional housing  [phone] [address] [place], [place] [address]     [place] Medicare, private pay, and private insurance [phone] [address] [place],  [place] [phone]  [facility]  Services include individual counseling, substance abuse intensive outpatient program (several hours a day, several days a week), day treatment  Delene Loll, Medicaid, private insurance  [phone] [address], [address] [place], [place] [address]  [facility]   Offers substance abuse intensive outpatient program (several hours a day, several days a week), partial hospitalization program [phone] [address] [place], [place] [address]  [phone] [address] [place], [place] [address]  [phone] [address] [place], [place] [address]  [phone], [address] [place], [place] [address]  [facility]  Individual counseling only  Accepts private insurance only [phone] [address] [place], [place] [address]  [facility]  Methadone maintenance program [phone] [address] [place], [place] [address]  [facility]  Walk-In Clinic providing substance abuse and mental health counseling  Accepts Medicaid, Medicare, private insurance  Offers sliding scale for uninsured [phone] [address] [place], [place]   [facility] [place], [employer].  Offers individual counseling, and intensive in-home services [phone] [address] [place], [place] [address]  [facility] individual counseling, family counseling, group therapy, domestic violence counseling, consumer credit counseling  Accepts Medicare, Medicaid, private insurance  Offers sliding scale for uninsured [phone] [address] [place], [place] [address]  [phone] [facility], [address] [place], [place] [address]43  [facility]  Offers individual, family and group counseling  3 locations - [place], [place], and [place]  [phone]  [address] [place], [place] [address]  [address] [place], [place] [address]  [address] [place], [place] [address]  [facility]    Offers psychiatric assessment, 8-week Intensive Outpatient Program (several hours a day, several times a week, daytime or  evenings), early recovery group, family Program, medication management  Private pay or private insurance only [phone], or  [phone] [address] [place], [place] [address]  [place] [employer] individual, couples and family counseling  Accepts Medicaid, private insurance, and sliding scale for uninsured [phone] [address] [place], [place] [address]  Len Blalock, MD  Individual counseling  Private insurance [phone] [address] [place], [place] [address]  [facility]   Offers assessment, substance abuse treatment, and behavioral health treatment [phone] [address] [place], [place] [address]  [facility]  Individual counseling  Accepts private insurance [phone] [address] [place], [place] [address]  [facility]  Individual counseling  Accepts Medicare, private insurance [phone] [address] [place], [place] [address]  [facility]    Offers intensive outpatient program (several hours a day, several times a week)  Private pay, private insurance [phone] [facility] [place], [place]  [facility]  Individual counseling  Medicare, private insurance [phone] [address] [place], [place] [address]  [facility]    Offers intensive outpatient program (several hours a day, several times a week) and partial hospitalization program [phone] [address] [place], [place] [address]  Emerson Monte, MD  Individual counseling [phone] [address] [place], [place] [address]  [facility]  Offers Christian counseling to individuals, couples, and families  Accepts Medicare and private insurance; offers sliding scale for uninsured [phone] [address] [place], [place] [address]  [facility]   [place] counseling [phone]  [address], [place], [place] [address]  [facility] [employer] crisis counseling, individual counseling, group therapy, in-home therapy, domestic violence services, day treatment, DWI services, Conservation officer, nature (CST), Actuary (ACTT), substance abuse Intensive Outpatient Program (several hours a day, several times a week)  2 locations - [place] and [place], [place] [address]  [phone] [address] [place], [place] [address]  [place] counseling and group therapy  [employer], [place], [place] [phone] [address] [place], [place]  [facility]  Offers individual and family counseling  Offers LGBTQ services  Accepts private insurance and private pay [phone] [place], [place] [address]  [facility]    Offers individual counseling, group therapy, and outpatient detox  Accepts private insurance [phone] [place], [place] Medicare, private insurance [phone] [address] [place], [place] [address]  [employer]  Individual counseling  Accepts Medicare, private insurance [phone] [address] [place], [place] [address]  [facility] [place] substance abuse Intensive Outpatient Program (several hours a day, several times a week) [phone], or [phone] [place], [place]

## 2015-07-19 DIAGNOSIS — F411 Generalized anxiety disorder: Secondary | ICD-10-CM | POA: Insufficient documentation

## 2015-07-19 DIAGNOSIS — F419 Anxiety disorder, unspecified: Secondary | ICD-10-CM | POA: Insufficient documentation

## 2015-07-19 DIAGNOSIS — H903 Sensorineural hearing loss, bilateral: Secondary | ICD-10-CM | POA: Insufficient documentation

## 2015-07-26 ENCOUNTER — Telehealth: Payer: Self-pay | Admitting: Cardiology

## 2015-07-26 NOTE — Telephone Encounter (Signed)
Returned call to patient and husband. They expressed they feel the monitor is producing more anxiety than what it is worth to wear it. I explained that the monitor is simply to monitor her heart rhythm and it does not do anything else to her but that. Patient already has anxiety and panic attacks. She has developed a rash underneath both of her breasts. She reports it is very red and hurts to the touch. She has put some "fungus" cream on it from a previous Rx she had at home. Patient asked if the leads had latex in them and I assured her they did not.  She is asking if she can discontinue wearing the monitor and if that's ok with the doctor if she has worn it long enough.  The monitor was placed on 07/02/15. Will route to Dr Stanford Breed for review.

## 2015-07-26 NOTE — Telephone Encounter (Signed)
New Message  Pt husband requested to speak w/ RN about pt's heart monitor. Please call back and discuss.

## 2015-07-26 NOTE — Telephone Encounter (Signed)
Ok to DC monitor [employer]

## 2015-07-26 NOTE — Telephone Encounter (Signed)
Returned call to patient's dpr and said it was ok to d/c monitor.  He was very Patent attorney.

## 2015-08-21 ENCOUNTER — Ambulatory Visit: Payer: Medicare Other | Admitting: Cardiology

## 2015-11-08 ENCOUNTER — Ambulatory Visit
Admission: RE | Admit: 2015-11-08 | Discharge: 2015-11-08 | Disposition: A | Discharge status: Home / Self Care | Payer: Medicare Other | Source: Ambulatory Visit | Attending: Nurse Practitioner | Admitting: Nurse Practitioner

## 2015-11-08 DIAGNOSIS — D0511 Intraductal carcinoma in situ of right breast: Secondary | ICD-10-CM

## 2015-11-14 ENCOUNTER — Ambulatory Visit: Payer: Medicare Other | Admitting: Nurse Practitioner

## 2015-11-14 ENCOUNTER — Other Ambulatory Visit: Payer: Medicare Other

## 2015-11-22 ENCOUNTER — Other Ambulatory Visit: Payer: Self-pay | Admitting: *Deleted

## 2015-11-22 DIAGNOSIS — D0511 Intraductal carcinoma in situ of right breast: Secondary | ICD-10-CM

## 2015-11-25 ENCOUNTER — Encounter: Payer: Self-pay | Admitting: Oncology

## 2015-11-25 ENCOUNTER — Telehealth: Payer: Self-pay | Admitting: Oncology

## 2015-11-25 ENCOUNTER — Ambulatory Visit (HOSPITAL_BASED_OUTPATIENT_CLINIC_OR_DEPARTMENT_OTHER): Payer: Medicare Other | Admitting: Oncology

## 2015-11-25 ENCOUNTER — Other Ambulatory Visit (HOSPITAL_BASED_OUTPATIENT_CLINIC_OR_DEPARTMENT_OTHER): Payer: Medicare Other

## 2015-11-25 VITALS — BP 121/72 | HR 56 | Temp 98.2°F | Resp 18 | Wt 204.6 lb

## 2015-11-25 DIAGNOSIS — Z853 Personal history of malignant neoplasm of breast: Secondary | ICD-10-CM | POA: Diagnosis not present

## 2015-11-25 DIAGNOSIS — M8580 Other specified disorders of bone density and structure, unspecified site: Secondary | ICD-10-CM

## 2015-11-25 DIAGNOSIS — D0511 Intraductal carcinoma in situ of right breast: Secondary | ICD-10-CM

## 2015-11-25 LAB — COMPREHENSIVE METABOLIC PANEL
ALBUMIN: 3.5 g/dL (ref 3.5–5.0)
ALK PHOS: 83 U/L (ref 40–150)
ALT: 14 U/L (ref 0–55)
ANION GAP: 9 meq/L (ref 3–11)
AST: 16 U/L (ref 5–34)
BILIRUBIN TOTAL: 0.71 mg/dL (ref 0.20–1.20)
BUN: 13.4 mg/dL (ref 7.0–26.0)
CALCIUM: 9.2 mg/dL (ref 8.4–10.4)
CO2: 29 mEq/L (ref 22–29)
Chloride: 98 mEq/L (ref 98–109)
Creatinine: 0.9 mg/dL (ref 0.6–1.1)
EGFR: 61 mL/min/{1.73_m2} — AB (ref >90)
Glucose: 100 mg/dl (ref 70–140)
Potassium: 4.3 mEq/L (ref 3.5–5.1)
Sodium: 136 mEq/L (ref 136–145)
TOTAL PROTEIN: 7 g/dL (ref 6.4–8.3)

## 2015-11-25 LAB — CBC WITH DIFFERENTIAL/PLATELET
BASO%: 1 % (ref 0.0–2.0)
Basophils Absolute: 0.1 10*3/uL (ref 0.0–0.1)
EOS ABS: 0.1 10*3/uL (ref 0.0–0.5)
EOS%: 1.6 % (ref 0.0–7.0)
HEMATOCRIT: 43.1 % (ref 34.8–46.6)
HEMOGLOBIN: 14.2 g/dL (ref 11.6–15.9)
LYMPH%: 21.5 % (ref 14.0–49.7)
MCH: 30.7 pg (ref 25.1–34.0)
MCHC: 33 g/dL (ref 31.5–36.0)
MCV: 93.1 fL (ref 79.5–101.0)
MONO#: 0.6 10*3/uL (ref 0.1–0.9)
MONO%: 9.9 % (ref 0.0–14.0)
NEUT%: 66 % (ref 38.4–76.8)
NEUTROS ABS: 4.2 10*3/uL (ref 1.5–6.5)
PLATELETS: 288 10*3/uL (ref 145–400)
RBC: 4.62 10*6/uL (ref 3.70–5.45)
RDW: 13.4 % (ref 11.2–14.5)
WBC: 6.3 10*3/uL (ref 3.9–10.3)
lymph#: 1.4 10*3/uL (ref 0.9–3.3)

## 2015-11-25 NOTE — Progress Notes (Signed)
ID: Laureen Ochs   DOB: 1930-05-07  MR#: [id number]  SIN#:[id number]  PCP: Astrid Divine, MD GYN: SUFelicity Pellegrini MD OTHER MD: Sheran Luz MD  CHIEF COMPLAINT: Ductal carcinoma in situ  CURRENT TREATMENT: Observation   HISTORY OF PRESENT ILLNESS: From the original intake note:  Ms. Sellin has a significant family history of breast cancer and has been followed closely through Dr. Cherlyn Labella office.  She had screening diagnostic mammography 12/27/2007, which only showed scattered parenchymal density.  Even though the mammogram was negative because of the family history, Dr. Yolanda Bonine felt an MRI should be obtained for screening and this was done on 03/01/2008.  It showed an asymmetric area of enhancement in the upper inner quadrant of the right breast measuring up to 1.3 cm; an incidental hepatic cyst was also noted.  With this information, ultrasound of the right breast was obtained but this failed to show the abnormality in question, so the patient underwent MRI-guided biopsy of the right breast area of abnormality on 03/26/2008.  The final pathology ([id number]) showed a low-grade ductal carcinoma in situ with no evidence of an invasive component, 100% ER and 100% PR positive.    With this information, the patient was referred to Dr. Carolynne Edouard, and after appropriate discussion on 04/24/2008, she underwent needle localization lumpectomy with the final pathology [id number]) showing a grade 1 ductal carcinoma in situ measuring 8 mm with negative margins, the closest being 2 mm and the specimen being intact. The patient underwent radiation therapy which was completed in June of 2010. She has been on anastrozole since that time.   Her subsequent history as detailed below.  INTERVAL HISTORY: Annalynne returns today accompanied by her husband, [name]. She has been having cold symptoms that are improving for about 1 week. Has been taking Mucinex DM to help the cough. Rande has also been having  panic attacks. Seeing a counselor weekly and on Zoloft 75 mg daily. Uses Ativan 1/2 tab 2-3 times per week.    REVIEW OF SYSTEMS: She has insomnia which has been present for years. She feels fatigued. Her hearing aid is working fairly well. She has an irregular heartbeat which is controlled on beta blockers. She occasionally has problems with candidal rashes and uses ketoconazole cream. A detailed review of systems is otherwise stable  PAST MEDICAL HISTORY: Past Medical History:  Diagnosis Date  . ACE-inhibitor cough   . Breast CA (HCC)    s/p lumpectomy & radiation  . CAD (coronary artery disease) 07/1998   s/p cath in 2000. L main with 30 to 40%  . Hearing loss   . HTN (hypertension)   . Hyperlipidemia   . Obesities, morbid (HCC)   . Osteoporosis   . Thyroid disease   . Vertebral fracture, pathological     PAST SURGICAL HISTORY: Past Surgical History:  Procedure Laterality Date  . BREAST LUMPECTOMY  March 2010  . BREAST LUMPECTOMY WITH RADIOACTIVE SEED LOCALIZATION Left 09/20/2014   Procedure: LEFT BREAST LUMPECTOMY WITH RADIOACTIVE SEED LOCALIZATION;  Surgeon: [name], MD;  Location: [facility];  Service: General;  Laterality: Left;  . CARPAL TUNNEL RELEASE Right   . CESAREAN SECTION    . EYE SURGERY     Right Eye  . Skin Cancer Removed from Right Forearm    . TOTAL ABDOMINAL HYSTERECTOMY      FAMILY HISTORY Family History  Problem Relation Age of Onset  . Coronary artery disease Mother   . Hypertension  Mother   . Melanoma Mother   . Cancer Mother 88    breast  . Cancer Father 64    prostate  . Cancer Sister 21    breast  . Cancer Maternal Aunt 74    breast  . Cancer Sister 88    breast   GYNECOLOGIC HISTORY:  She is GX P2, first pregnancy to term age 82.  The patient does not know when she went through menopause since she had a hysterectomy.  She never took hormones.  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   Her husband, Nicole Kindred, used to work for [employer] in Librarian, academic, later taught math, Probation officer, and computers in the local school.  They are both retired.  Their daughter, [name],  does childcare at her church.  Daughter Hildred Alamin works in occupational therapy.  The patient has 2 grandchildren.  The patient is not a church attender.  She previously attended [employer].    ADVANCED DIRECTIVES: not in place  HEALTH MAINTENANCE: Social History  Substance Use Topics  . Smoking status: Former Smoker    Types: Cigarettes    Quit date: 01/13/1963  . Smokeless tobacco: Never Used     Comment: stopped in 1970's  . Alcohol use No     Colonoscopy:  PAP:  Bone density: SEPT 2012, T -1.7  Lipid panel:  Allergies  Allergen Reactions  . Ace Inhibitors Cough  . Latex Rash    Current Outpatient Prescriptions  Medication Sig Dispense Refill  . aspirin 81 MG tablet Take 81 mg by mouth daily.      Marland Kitchen atenolol (TENORMIN) 25 MG tablet Take 1 tablet (25 mg total) by mouth daily. 90 tablet 3  . Calcium Carbonate-Vitamin D (CALCIUM + D PO) Take by mouth 2 (two) times daily.      . furosemide (LASIX) 20 MG tablet TAKE 1 TABLET BY MOUTH DAILY. 30 tablet 11  . levothyroxine (SYNTHROID, LEVOTHROID) 75 MCG tablet Take 75 mcg by mouth daily.      Marland Kitchen LORazepam (ATIVAN) 0.5 MG tablet Take 0.5 mg by mouth every 6 (six) hours as needed.    Marland Kitchen losartan (COZAAR) 50 MG tablet Take 1 tablet (50 mg total) by mouth daily. 90 tablet 3  . sertraline (ZOLOFT) 50 MG tablet Take 75 mg by mouth daily.    . simvastatin (ZOCOR) 10 MG tablet Take 10 mg by mouth daily at 6 PM.      No current facility-administered medications for this visit.     OBJECTIVE: Elderly white woman in no acute distress Vitals:   11/25/15 1119  BP: 121/72  Pulse: (!) 56  Resp: 18  Temp: 98.2 F (36.8 C)     Body mass index is 36.24 kg/m.    ECOG FS: 1  Sclerae unicteric, EOMs intact Oropharynx clear, dentition in good repair No  cervical or supraclavicular adenopathy Lungs no rales or rhonchi Heart regular rate and rhythm Abd soft, obese, nontender, positive bowel sounds MSK no focal spinal tenderness, no upper extremity lymphedema Neuro: nonfocal, well oriented, anxious affect Breasts: The right breast is status post lumpectomy and radiation. There is no evidence of local recurrence. The right axilla is benign. The left breast is status post recent lumpectomy. The incision is healing nicely. There is no swelling, erythema, or dehiscence. The left axilla is benign.     LAB RESULTS: Lab Results  Component Value Date   WBC 6.3 11/25/2015   NEUTROABS  4.2 11/25/2015   HGB 14.2 11/25/2015   HCT 43.1 11/25/2015   MCV 93.1 11/25/2015   PLT 288 11/25/2015      Chemistry      Component Value Date/Time   NA 136 11/25/2015 1107   K 4.3 11/25/2015 1107   CL 100 (L) 07/07/2015 2339   CL 101 10/15/2011 1123   CO2 29 11/25/2015 1107   BUN 13.4 11/25/2015 1107   CREATININE 0.9 11/25/2015 1107      Component Value Date/Time   CALCIUM 9.2 11/25/2015 1107   ALKPHOS 83 11/25/2015 1107   AST 16 11/25/2015 1107   ALT 14 11/25/2015 1107   BILITOT 0.71 11/25/2015 1107       STUDIES: Mm Diag Breast Tomo Bilateral  Result Date: 11/08/2015 CLINICAL DATA:  Malignant lumpectomy of the right breast in April, 2010 with adjuvant radiation therapy. Excisional biopsy of a high risk complex sclerosing lesion of the left breast in 2016. Annual evaluation. EXAM: 2D DIGITAL DIAGNOSTIC BILATERAL MAMMOGRAM WITH CAD AND ADJUNCT TOMO COMPARISON:  Previous exam(s). ACR Breast Density Category b: There are scattered areas of fibroglandular density. FINDINGS: Standard 2D and tomosynthesis full field CC and MLO views of both breasts were obtained. A standard spot magnification tangential view of the lumpectomy site in the inner right breast was also obtained. Scarring at the lumpectomy site in the right breast. No new or suspicious findings  in the right breast. Scarring at the site of excisional biopsy in outer left breast. No new or suspicious findings in the left breast. Mammographic images were processed with CAD. IMPRESSION: No mammographic evidence of malignancy. Expected post lumpectomy changes in the right breast and expected post surgical changes in the left breast. RECOMMENDATION: As it has been 7 years since the malignant lumpectomy, the patient may return to annual screening. Screening mammogram in one year is recommended.(Code:[id number]-B-01Y) I have discussed the findings and recommendations with the patient. Results were also provided in writing at the conclusion of the visit. If applicable, a reminder letter will be sent to the patient regarding the next appointment. BI-RADS CATEGORY  2: Benign. Electronically Signed   By: Evangeline Dakin M.D.   On: 11/08/2015 11:58    ASSESSMENT: 82 y.o.  BRCA negative [place] woman   (1)  status post right lumpectomy April 2010 for an 8-mm ductal carcinoma in situ, low grade, strongly estrogen and progesterone receptors positive, with negative margins,   (2)  status post radiation completed June 2010,   (3)  on anastrozole between June of 2010 in June of 2015  (4) status post left lumpectomy 09/20/2014 for a complex sclerosing lesion with no malignancy or atypia  (5) osteopenia with a T score of -1.7 in bone density at [facility] 11/04/2012     PLAN: Deshon is now 7-1/2 years out from her original breast surgery, for noninvasive disease. She has no signs of recurrence on exam and her labs. Mammogram was last performed in October 2017. Annual screening mammogram is recommended going forward.   She inquired about the high-dose flu vaccine today. Unfortunately, we do that have the high-dose vaccine. I have offered her a regular flu vaccine, but she prefers to get the high-dose at her PCP office.    The patient will follow-up with the survivorship clinic in 6 months and with Dr. [name] in  1 year.   She knows to call for any problems that may develop before her next visit here.      [place], [place]  11/25/2015   

## 2015-11-25 NOTE — Telephone Encounter (Signed)
Appointments scheduled per 11/13 LOS. Patient given AVS report and calendars with future scheduled appointments. °

## 2015-12-09 ENCOUNTER — Other Ambulatory Visit: Payer: Self-pay | Admitting: *Deleted

## 2015-12-09 MED ORDER — ATENOLOL 25 MG PO TABS: 25.0000 mg | ORAL_TABLET | Freq: Every day | ORAL | 0 refills | Status: DC

## 2015-12-09 MED ORDER — LOSARTAN POTASSIUM 50 MG PO TABS: 50.0000 mg | ORAL_TABLET | Freq: Every day | ORAL | 0 refills | Status: DC

## 2015-12-09 NOTE — Addendum Note (Signed)
Addended by: Domenica Reamer R on: 12/09/2015 12:48 PM   Modules accepted: Orders

## 2016-01-09 ENCOUNTER — Other Ambulatory Visit: Payer: Self-pay | Admitting: Cardiology

## 2016-01-09 NOTE — Telephone Encounter (Signed)
REFILL 

## 2016-01-17 NOTE — [facility] Notes (Signed)
HPI: FU coronary disease. Patient had a cardiac catheterization in 2002 that showed a 40% left main. In August 2015 pt had myoview that showed EF 64 with no ischemia. Carotid dopplers 6/17 normal. Patient seen for syncope June 2017. Monitor showed sinus with rare PAC and PVC. Since she was last seen she has mild dyspnea on exertion but no orthopnea, PND, pedal edema, chest pain or recurrent syncope.  Current Outpatient Prescriptions  Medication Sig Dispense Refill  . aspirin 81 MG tablet Take 81 mg by mouth daily.      Marland Kitchen atenolol (TENORMIN) 25 MG tablet Take 1 tablet (25 mg total) by mouth daily. KEEP OV. 30 tablet 0  . Calcium Carbonate-Vitamin D (CALCIUM + D PO) Take by mouth 2 (two) times daily.      . furosemide (LASIX) 20 MG tablet TAKE 1 TABLET BY MOUTH DAILY. 30 tablet 11  . levothyroxine (SYNTHROID, LEVOTHROID) 75 MCG tablet Take 75 mcg by mouth daily.      Marland Kitchen LORazepam (ATIVAN) 0.5 MG tablet Take 0.25 mg by mouth every 6 (six) hours as needed.     Marland Kitchen losartan (COZAAR) 50 MG tablet Take 1 tablet (50 mg total) by mouth daily. KEEP OV. 30 tablet 0  . sertraline (ZOLOFT) 50 MG tablet Take 112.5 mg by mouth daily.     . simvastatin (ZOCOR) 10 MG tablet Take 10 mg by mouth daily at 6 PM.      No current facility-administered medications for this visit.      Past Medical History:  Diagnosis Date  . ACE-inhibitor cough   . Breast CA ([place])    s/p lumpectomy & radiation  . CAD (coronary artery disease) 07/1998   s/p cath in 2000. L main with 30 to 40%  . Hearing loss   . HTN (hypertension)   . Hyperlipidemia   . Obesities, morbid ([place])   . Osteoporosis   . Thyroid disease   . Vertebral fracture, pathological     Past Surgical History:  Procedure Laterality Date  . BREAST LUMPECTOMY  March 2010  . BREAST LUMPECTOMY WITH RADIOACTIVE SEED LOCALIZATION Left 09/20/2014   Procedure: LEFT BREAST LUMPECTOMY WITH RADIOACTIVE SEED LOCALIZATION;  Surgeon: Autumn Messing III, MD;  Location:  [facility];  Service: General;  Laterality: Left;  . CARPAL TUNNEL RELEASE Right   . CESAREAN SECTION    . EYE SURGERY     Right Eye  . Skin Cancer Removed from Right Forearm    . TOTAL ABDOMINAL HYSTERECTOMY      Social History   Social History  . Marital status: Married    Spouse name: Tressie Ellis  . Number of children: N/A  . Years of education: N/A   Occupational History  . Retired from [employer] Retired   Social History Main Topics  . Smoking status: Former Smoker    Types: Cigarettes    Quit date: 01/13/1963  . Smokeless tobacco: Never Used     Comment: stopped in 1970's  . Alcohol use No  . Drug use: No  . Sexual activity: Not on file   Other Topics Concern  . Not on file   Social History Narrative  . No narrative on file    Family History  Problem Relation Age of Onset  . Coronary artery disease Mother   . Hypertension Mother   . Melanoma Mother   . Cancer Mother 71    breast  . Cancer Father 54  prostate  . Cancer Sister 61    breast  . Cancer Maternal Aunt 45    breast  . Cancer Sister 26    breast    ROS: Recent problems with depression but no fevers or chills, productive cough, hemoptysis, dysphasia, odynophagia, melena, hematochezia, dysuria, hematuria, rash, seizure activity, orthopnea, PND, pedal edema, claudication. Remaining systems are negative.  Physical Exam: Well-developed obese in no acute distress.  Skin is warm and dry.  HEENT is normal.  Neck is supple.  Chest is clear to auscultation with normal expansion.  Cardiovascular exam is regular rate and rhythm.  Abdominal exam nontender or distended. No masses palpated. Extremities show no edema. neuro grossly intact  ECG-Sinus bradycardia at a rate of 51. Left anterior fascicular block. Poor R-wave progression.  A/P  1 coronary artery disease-continue aspirin and statin.  2 hypertension-blood pressure controlled. Continue present medications. Potassium and renal  function monitored by primary care.  3 hyperlipidemia-continue statin. Lipids and liver monitored by primary care.   4 Syncope-no recurrent episodes.   Kirk Ruths, MD

## 2016-01-23 ENCOUNTER — Encounter: Payer: Self-pay | Admitting: Cardiology

## 2016-01-23 ENCOUNTER — Ambulatory Visit (INDEPENDENT_AMBULATORY_CARE_PROVIDER_SITE_OTHER): Payer: Medicare Other | Admitting: Cardiology

## 2016-01-23 VITALS — BP 114/62 | HR 51 | Ht 62.0 in | Wt 206.0 lb

## 2016-01-23 DIAGNOSIS — I2510 Atherosclerotic heart disease of native coronary artery without angina pectoris: Secondary | ICD-10-CM

## 2016-01-23 DIAGNOSIS — E7800 Pure hypercholesterolemia, unspecified: Secondary | ICD-10-CM

## 2016-01-23 DIAGNOSIS — I10 Essential (primary) hypertension: Secondary | ICD-10-CM | POA: Diagnosis not present

## 2016-01-23 NOTE — Patient Instructions (Signed)
Your physician wants you to follow-up in: ONE YEAR WITH DR CRENSHAW You will receive a reminder letter in the mail two months in advance. If you don't receive a letter, please call our office to schedule the follow-up appointment.   If you need a refill on your cardiac medications before your next appointment, please call your pharmacy.  

## 2016-02-02 ENCOUNTER — Other Ambulatory Visit: Payer: Self-pay | Admitting: Cardiology

## 2016-02-05 ENCOUNTER — Other Ambulatory Visit: Payer: Self-pay | Admitting: Cardiology

## 2016-02-10 ENCOUNTER — Other Ambulatory Visit: Payer: Self-pay | Admitting: Cardiology

## 2016-02-11 ENCOUNTER — Other Ambulatory Visit: Payer: Self-pay

## 2016-02-11 MED ORDER — LOSARTAN POTASSIUM 50 MG PO TABS: 50.0000 mg | ORAL_TABLET | Freq: Every day | ORAL | 3 refills | Status: DC

## 2016-02-11 MED ORDER — ATENOLOL 25 MG PO TABS: 25.0000 mg | ORAL_TABLET | Freq: Every day | ORAL | 3 refills | Status: DC

## 2016-02-11 NOTE — Telephone Encounter (Signed)
Rx(s) sent to pharmacy electronically.  

## 2016-05-22 ENCOUNTER — Encounter: Payer: Medicare Other | Admitting: Nurse Practitioner

## 2016-05-27 ENCOUNTER — Telehealth: Payer: Self-pay | Admitting: Cardiology

## 2016-05-27 NOTE — Telephone Encounter (Signed)
Returned call to Thomasene Lot, NP @ [facility] - she is with a patient. Advised the operator that MD will be in the office tomorrow morning.   Message to Hilda Blades, Therapist, sports as Juluis Rainier

## 2016-05-27 NOTE — Telephone Encounter (Signed)
Please call,concerning pt's low pulse rate in relationship to her condition.

## 2016-05-29 ENCOUNTER — Other Ambulatory Visit: Payer: Medicare Other

## 2016-05-29 ENCOUNTER — Encounter: Payer: Medicare Other | Admitting: Adult Health

## 2016-05-29 NOTE — Telephone Encounter (Signed)
Office is closed today

## 2016-06-01 ENCOUNTER — Other Ambulatory Visit (HOSPITAL_BASED_OUTPATIENT_CLINIC_OR_DEPARTMENT_OTHER): Payer: Medicare Other

## 2016-06-01 ENCOUNTER — Encounter: Payer: Self-pay | Admitting: Adult Health

## 2016-06-01 ENCOUNTER — Ambulatory Visit (HOSPITAL_BASED_OUTPATIENT_CLINIC_OR_DEPARTMENT_OTHER): Payer: Medicare Other | Admitting: Adult Health

## 2016-06-01 VITALS — BP 137/52 | HR 56 | Temp 98.0°F | Resp 19 | Ht 62.0 in | Wt 206.4 lb

## 2016-06-01 DIAGNOSIS — Z86000 Personal history of in-situ neoplasm of breast: Secondary | ICD-10-CM

## 2016-06-01 DIAGNOSIS — D0511 Intraductal carcinoma in situ of right breast: Secondary | ICD-10-CM

## 2016-06-01 DIAGNOSIS — Z170 Estrogen receptor positive status [ER+]: Secondary | ICD-10-CM

## 2016-06-01 DIAGNOSIS — Z1239 Encounter for other screening for malignant neoplasm of breast: Secondary | ICD-10-CM

## 2016-06-01 LAB — COMPREHENSIVE METABOLIC PANEL
ALBUMIN: 4 g/dL (ref 3.5–5.0)
ALK PHOS: 80 U/L (ref 40–150)
ALT: 18 U/L (ref 0–55)
AST: 19 U/L (ref 5–34)
Anion Gap: 11 mEq/L (ref 3–11)
BILIRUBIN TOTAL: 0.67 mg/dL (ref 0.20–1.20)
BUN: 14 mg/dL (ref 7.0–26.0)
CALCIUM: 9.6 mg/dL (ref 8.4–10.4)
CO2: 26 mEq/L (ref 22–29)
CREATININE: 0.9 mg/dL (ref 0.6–1.1)
Chloride: 101 mEq/L (ref 98–109)
EGFR: 60 mL/min/{1.73_m2} — ABNORMAL LOW (ref >90)
Glucose: 102 mg/dl (ref 70–140)
Potassium: 4.5 mEq/L (ref 3.5–5.1)
Sodium: 138 mEq/L (ref 136–145)
Total Protein: 7.2 g/dL (ref 6.4–8.3)

## 2016-06-01 LAB — CBC WITH DIFFERENTIAL/PLATELET
BASO%: 0.7 % (ref 0.0–2.0)
BASOS ABS: 0 10*3/uL (ref 0.0–0.1)
EOS ABS: 0.2 10*3/uL (ref 0.0–0.5)
EOS%: 3.9 % (ref 0.0–7.0)
HEMATOCRIT: 43.4 % (ref 34.8–46.6)
HEMOGLOBIN: 14.5 g/dL (ref 11.6–15.9)
LYMPH#: 1.4 10*3/uL (ref 0.9–3.3)
LYMPH%: 23.9 % (ref 14.0–49.7)
MCH: 31 pg (ref 25.1–34.0)
MCHC: 33.4 g/dL (ref 31.5–36.0)
MCV: 92.7 fL (ref 79.5–101.0)
MONO#: 0.5 10*3/uL (ref 0.1–0.9)
MONO%: 8.6 % (ref 0.0–14.0)
NEUT#: 3.7 10*3/uL (ref 1.5–6.5)
NEUT%: 62.9 % (ref 38.4–76.8)
Platelets: 264 10*3/uL (ref 145–400)
RBC: 4.68 10*6/uL (ref 3.70–5.45)
RDW: 13.9 % (ref 11.2–14.5)
WBC: 5.9 10*3/uL (ref 3.9–10.3)

## 2016-06-01 NOTE — [facility] Notes (Signed)
CLINIC:  Survivorship   REASON FOR VISIT:  Routine follow-up for history of breast cancer.   BRIEF ONCOLOGIC HISTORY:    Neoplasm of right breast, primary tumor staging category Tis: ductal carcinoma in situ (DCIS)   03/01/2008 Breast MRI    Right breast: Assymetric and smoothly marginated curvilinear area of enhancement in UIQ measuring 13 x 6 x 9 mm.  No suspicious findings in left breast. No axillary lympadenopathy.       03/19/2008 Breast US    Right breast: No solid or cystic mass, shadowing, or distortion detected. No palpable finding on physical exam to right breast.       03/26/2008 Initial Biopsy    Right breast needle core biopsy: Grade 1, DCIS. ER+ (100%), PR+ (100%).       03/26/2008 Initial Diagnosis    Neoplasm of right breast, primary tumor staging category Tis: ductal carcinoma in situ (DCIS)      04/24/2008 Surgery    Right breast lumpectomy Marlou Starks): Grade 1, DCIS, spanning 0.8 cm. Negative margins.       04/24/2008 Pathologic Stage    pTis, pNx, pMx: Stage 0      06/07/2008 - 07/03/2008 Radiation Therapy    Adjuvant RT completed Valere Dross).  Right breast: Total dose 45 Gy over 18 fractions. Right breast boost: 5 Gy over 2 fractions.       06/2008 - 06/2013 Anti-estrogen oral therapy    Completed 5 years of anti-estrogen therapy with Anastrazole (Magrinat)      07/28/2013 Procedure    Genetic counseling/testing: BreastNext gene panel negative. Genes tested include: ATM, BARD1, BRCA1/2, BRIP1, CDH1, CHEK2, MRE11A, MUTYH, NBN, NF1, PALB2, PTEN, RAD50, RAD51C, RAD51D, and TP53.       07/06/2014 Survivorship    Survivorship Care Plan given and reviewed with patient during in-person clinic visit.         INTERVAL HISTORY:  Carmen Haney presents to the Survivorship Clinic today for routine follow-up for her history of breast cancer.  Overall, she reports feeling quite well. She is concerned over some new right breast soreness and tenderness, and also some new nipple  changes over the past year as well.  She says the nipple has turned under her breast.  She is currently dealing with depression and panic attacks and is working with Zoloft to help control her depression.  She sees Glade Nurse for this and [place] as her counselor.  She complains of a rectal discomfort.  She says it hurts worse with sitting.  She does have h/o polyps, and is due now for colonoscopy.  She does see Dr. Kelton Pillar.  She has not evaluated this discomfort yet, but she is going to see her on Friday this week.     REVIEW OF SYSTEMS:  Review of Systems  Constitutional: Negative for appetite change, chills, diaphoresis, fatigue, fever and unexpected weight change.  HENT:   Negative for hearing loss and lump/mass.   Eyes: Negative for eye problems and icterus.  Respiratory: Negative for chest tightness, cough and shortness of breath.   Cardiovascular: Negative for chest pain, leg swelling and palpitations.  Gastrointestinal: Negative for abdominal distention and abdominal pain.  Endocrine: Negative for hot flashes.  Musculoskeletal: Negative for arthralgias.  Skin: Negative for itching.  Neurological: Negative for dizziness, extremity weakness and headaches.  Hematological: Negative for adenopathy. Does not bruise/bleed easily.    PAST MEDICAL/SURGICAL HISTORY:  Past Medical History:  Diagnosis Date  . ACE-inhibitor cough   . Breast  [facility])    s/p lumpectomy & radiation  . CAD (coronary artery disease) 07/1998   s/p cath in 2000. L main with 30 to 40%  . Hearing loss   . HTN (hypertension)   . Hyperlipidemia   . Obesities, morbid ([place])   . Osteoporosis   . Thyroid disease   . Vertebral fracture, pathological    Past Surgical History:  Procedure Laterality Date  . BREAST LUMPECTOMY  March 2010  . BREAST LUMPECTOMY WITH RADIOACTIVE SEED LOCALIZATION Left 09/20/2014   Procedure: LEFT BREAST LUMPECTOMY WITH RADIOACTIVE SEED LOCALIZATION;  Surgeon: Autumn Messing III, MD;   Location: [facility];  Service: General;  Laterality: Left;  . CARPAL TUNNEL RELEASE Right   . CESAREAN SECTION    . EYE SURGERY     Right Eye  . Skin Cancer Removed from Right Forearm    . TOTAL ABDOMINAL HYSTERECTOMY       ALLERGIES:  Allergies  Allergen Reactions  . Ace Inhibitors Cough  . Latex Rash     CURRENT MEDICATIONS:  Outpatient Encounter Prescriptions as of 06/01/2016  Medication Sig Note  . aspirin 81 MG tablet Take 81 mg by mouth daily.     Marland Kitchen atenolol (TENORMIN) 25 MG tablet Take 1 tablet (25 mg total) by mouth daily.   . Calcium Carbonate-Vitamin D (CALCIUM + D PO) Take by mouth 2 (two) times daily.     . furosemide (LASIX) 20 MG tablet TAKE 1 TABLET BY MOUTH DAILY.   Marland Kitchen levothyroxine (SYNTHROID, LEVOTHROID) 75 MCG tablet Take 75 mcg by mouth daily.     Marland Kitchen LORazepam (ATIVAN) 0.5 MG tablet Take 0.25 mg by mouth every 6 (six) hours as needed.  11/25/2015: Received from: [facility]) Received Sig: 1 tablet as needed  . losartan (COZAAR) 50 MG tablet Take 1 tablet (50 mg total) by mouth daily.   . sertraline (ZOLOFT) 50 MG tablet Take 112.5 mg by mouth daily.  11/25/2015: Received from: External Pharmacy  . simvastatin (ZOCOR) 10 MG tablet Take 10 mg by mouth daily at 6 PM.     No facility-administered encounter medications on file as of 06/01/2016.      ONCOLOGIC FAMILY HISTORY:  Family History  Problem Relation Age of Onset  . Coronary artery disease Mother   . Hypertension Mother   . Melanoma Mother   . Cancer Mother 59       breast  . Cancer Father 53       prostate  . Cancer Sister 26       breast  . Cancer Maternal Aunt 75       breast  . Cancer Sister 81       breast     SOCIAL HISTORY:  Carmen Haney is married and lives with her husband in [place], [place].  She has 2 children and they live in [place].  Carmen Haney is currently retired.  She denies any current or  history of tobacco, alcohol, or illicit drug use.     PHYSICAL EXAMINATION:  Vital Signs: Vitals:   06/01/16 0943  BP: (!) 137/52  Pulse: (!) 56  Resp: 19  Temp: 98 F (36.7 C)   Filed Weights   06/01/16 0943  Weight: 206 lb 6.4 oz (93.6 kg)   General: Well-nourished, well-appearing female in no acute distress.  Accompanied by her husband today.   HEENT: Head is normocephalic.  Pupils equal and reactive to  light. Conjunctivae clear without exudate.  Sclerae anicteric. Oral mucosa is pink, moist.  Oropharynx is pink without lesions or erythema.  Lymph: No cervical, supraclavicular, or infraclavicular lymphadenopathy noted on palpation.  Cardiovascular: Regular rate and rhythm.Marland Kitchen Respiratory: Clear to auscultation bilaterally. Chest expansion symmetric; breathing non-labored.  Breast Exam:  -Left breast: No appreciable masses on palpation. No skin redness, thickening, or peau d'orange appearance; no nipple retraction or nipple discharge;  -Right breast: No appreciable masses on palpation. No skin redness, thickening, or peau d'orange appearance; no nipple retraction or nipple discharge; mild distortion in symmetry at previous lumpectomy site well healed scar without erythema or nodularity. -Axilla: No axillary adenopathy bilaterally.  GI: Abdomen soft and round; non-tender, non-distended. Bowel sounds normoactive. No hepatosplenomegaly.   GU: Deferred.  Neuro: No focal deficits. Steady gait.  Psych: Mood and affect normal and appropriate for situation.  Extremities: No edema. Skin: Warm and dry.  LABORATORY DATA:  None for this visit   DIAGNOSTIC IMAGING:  Most recent mammogram:      ASSESSMENT AND PLAN:  Ms.. Haney is a pleasant 83 y.o. female with history of Stage 0 right DCIS, ER+/PR+/HER2-, diagnosed in 03/2008, treated with lumpectomy, adjuvant radiation therapy, and anti-estrogen therapy with Anastrozole x 5 years.  She presents to the Survivorship Clinic for  surveillance and routine follow-up.   1. History of breast cancer:  Carmen Haney is currently clinically and radiographically without evidence of disease or recurrence of breast cancer. She will be due for mammogram in 10/2016; orders placed today.  I encouraged her to call me with any questions or concerns before her next visit at the cancer center, and I would be happy to see her sooner, if needed.    2. Bone health:  Given Carmen Haney's age, history of breast cancer, and her previous anti-estrogen therapy with Anastrozole, she is at risk for bone demineralization. She cannot recall when her last bone density was, but thinks it was recently done by her PCP.  She is going to look into this when she sees her PCP on Friday.  In the meantime, she was encouraged to increase her consumption of foods rich in calcium, as well as increase her weight-bearing activities.  She was given education on specific food and activities to promote bone health.  3. Cancer screening:  Due to Carmen Haney's history and her age, she should receive screening for skin cancers. She was encouraged to follow-up with her PCP for appropriate cancer screenings.   4. Health maintenance and wellness promotion: Carmen Haney was encouraged to consume 5-7 servings of fruits and vegetables per day. She was also encouraged to engage in moderate to vigorous exercise for 30 minutes per day most days of the week. She was instructed to limit her alcohol consumption and continue to abstain from tobacco use.    Dispo:  -Return to cancer center in one year for LTS visit   A total of (30) minutes of face-to-face time was spent with this patient with greater than 50% of that time in counseling and care-coordination.   Gardenia Phlegm, [place] [phone]   Note: PRIMARY CARE PROVIDER Kelton Pillar, [place] [phone]

## 2016-10-22 ENCOUNTER — Encounter: Payer: Self-pay | Admitting: Genetic Counselor

## 2016-10-22 DIAGNOSIS — Z1379 Encounter for other screening for genetic and chromosomal anomalies: Secondary | ICD-10-CM | POA: Insufficient documentation

## 2016-11-13 ENCOUNTER — Ambulatory Visit
Admission: RE | Admit: 2016-11-13 | Discharge: 2016-11-13 | Disposition: A | Discharge status: Home / Self Care | Payer: Medicare Other | Source: Ambulatory Visit | Attending: Adult Health | Admitting: Adult Health

## 2016-11-13 DIAGNOSIS — Z1239 Encounter for other screening for malignant neoplasm of breast: Secondary | ICD-10-CM

## 2016-11-13 HISTORY — DX: Personal history of irradiation: Z92.3

## 2017-01-06 DIAGNOSIS — T464X5A Adverse effect of angiotensin-converting-enzyme inhibitors, initial encounter: Secondary | ICD-10-CM | POA: Insufficient documentation

## 2017-01-06 DIAGNOSIS — Z923 Personal history of irradiation: Secondary | ICD-10-CM | POA: Insufficient documentation

## 2017-01-06 DIAGNOSIS — E079 Disorder of thyroid, unspecified: Secondary | ICD-10-CM | POA: Insufficient documentation

## 2017-01-06 DIAGNOSIS — M8448XA Pathological fracture, other site, initial encounter for fracture: Secondary | ICD-10-CM | POA: Insufficient documentation

## 2017-01-06 DIAGNOSIS — R05 Cough: Secondary | ICD-10-CM | POA: Insufficient documentation

## 2017-01-06 DIAGNOSIS — C50919 Malignant neoplasm of unspecified site of unspecified female breast: Secondary | ICD-10-CM | POA: Insufficient documentation

## 2017-01-06 DIAGNOSIS — E039 Hypothyroidism, unspecified: Secondary | ICD-10-CM | POA: Insufficient documentation

## 2017-01-20 NOTE — [facility] Notes (Signed)
HPI: FU coronary disease. Patient had a cardiac catheterization in 2002 that showed a 40% left main. In August 2015 pt had myoview that showed EF 64 with no ischemia. Carotid dopplers 6/17 normal. Patient seen for syncope June 2017. Monitor showed sinus with rare PAC and PVC. Since she was last seen the patient has dyspnea with more extreme activities but not with routine activities. It is relieved with rest. It is not associated with chest pain. There is no orthopnea, PND or pedal edema. There is no syncope or palpitations. There is no exertional chest pain.   Current Outpatient Medications  Medication Sig Dispense Refill  . aspirin 81 MG tablet Take 81 mg by mouth daily.      Marland Kitchen atenolol (TENORMIN) 25 MG tablet Take 1 tablet (25 mg total) by mouth daily. 90 tablet 3  . Calcium Carbonate-Vitamin D (CALCIUM + D PO) Take by mouth 2 (two) times daily.      . furosemide (LASIX) 20 MG tablet TAKE 1 TABLET BY MOUTH DAILY. 90 tablet 10  . levothyroxine (SYNTHROID, LEVOTHROID) 88 MCG tablet Take 88 mcg by mouth daily.     Marland Kitchen LORazepam (ATIVAN) 0.5 MG tablet Take 0.25 mg by mouth every 6 (six) hours as needed.     Marland Kitchen losartan (COZAAR) 50 MG tablet Take 1 tablet (50 mg total) by mouth daily. 90 tablet 3  . sertraline (ZOLOFT) 100 MG tablet Take 200 mg by mouth daily.     . simvastatin (ZOCOR) 10 MG tablet Take 10 mg by mouth daily at 6 PM.      No current facility-administered medications for this visit.      Past Medical History:  Diagnosis Date  . ACE-inhibitor cough   . Breast CA ([place])    s/p lumpectomy & radiation  . CAD (coronary artery disease) 07/1998   s/p cath in 2000. L main with 30 to 40%  . Hearing loss   . HTN (hypertension)   . Hyperlipidemia   . Obesities, morbid ([place])   . Osteoporosis   . Personal history of radiation therapy   . Thyroid disease   . Vertebral fracture, pathological     Past Surgical History:  Procedure Laterality Date  . BREAST BIOPSY Left   .  BREAST LUMPECTOMY  March 2010  . BREAST LUMPECTOMY WITH RADIOACTIVE SEED LOCALIZATION Left 09/20/2014   Procedure: LEFT BREAST LUMPECTOMY WITH RADIOACTIVE SEED LOCALIZATION;  Surgeon: Autumn Messing III, MD;  Location: [facility];  Service: General;  Laterality: Left;  . CARPAL TUNNEL RELEASE Right   . CESAREAN SECTION    . EYE SURGERY     Right Eye  . Skin Cancer Removed from Right Forearm    . TOTAL ABDOMINAL HYSTERECTOMY      Social History   Socioeconomic History  . Marital status: Married    Spouse name: Tressie Ellis  . Number of children: Not on file  . Years of education: Not on file  . Highest education level: Not on file  Social Needs  . Financial resource strain: Not on file  . Food insecurity - worry: Not on file  . Food insecurity - inability: Not on file  . Transportation needs - medical: Not on file  . Transportation needs - non-medical: Not on file  Occupational History  . Occupation: Retired from [employer]    Employer: RETIRED  Tobacco Use  . Smoking status: Former Smoker    Types: Cigarettes    Last attempt  to quit: 01/13/1963    Years since quitting: 54.0  . Smokeless tobacco: Never Used  . Tobacco comment: stopped in 1970's  Substance and Sexual Activity  . Alcohol use: No    Alcohol/week: 0.0 oz  . Drug use: No  . Sexual activity: Not on file  Other Topics Concern  . Not on file  Social History Narrative  . Not on file    Family History  Problem Relation Age of Onset  . Coronary artery disease Mother   . Hypertension Mother   . Melanoma Mother   . Cancer Mother 80       breast  . Breast cancer Mother   . Cancer Father 33       prostate  . Cancer Sister 62       breast  . Breast cancer Sister   . Cancer Maternal Aunt 86       breast  . Cancer Sister 46       breast  . Breast cancer Sister   . Breast cancer Daughter     ROS: problems with anxiety no fevers or chills, productive cough, hemoptysis, dysphasia, odynophagia, melena,  hematochezia, dysuria, hematuria, rash, seizure activity, orthopnea, PND, pedal edema, claudication. Remaining systems are negative.  Physical Exam: Well-developed obese in no acute distress.  Skin is warm and dry.  HEENT is normal.  Neck is supple.  Chest is clear to auscultation with normal expansion.  Cardiovascular exam is regular rate and rhythm.  Abdominal exam nontender or distended. No masses palpated. Extremities show no edema. neuro grossly intact  ECG- sinus bradycardia at a rate of 50. Left anterior fascicular block. personally reviewed  A/P  1 coronary artery disease-continue aspirin and statin. Patient denies chest pain.  2 hypertension-blood pressure controlled today. We will continue with present regimen. Potassium and renal function monitored by primary care.  3 hyperlipidemia-continue statin. Lipids and liver monitored by primary care.  Kirk Ruths, MD

## 2017-01-27 ENCOUNTER — Ambulatory Visit: Payer: Medicare Other | Admitting: Cardiology

## 2017-01-27 ENCOUNTER — Encounter: Payer: Self-pay | Admitting: Cardiology

## 2017-01-27 VITALS — BP 126/72 | HR 50 | Ht 62.5 in | Wt 208.0 lb

## 2017-01-27 DIAGNOSIS — E7800 Pure hypercholesterolemia, unspecified: Secondary | ICD-10-CM | POA: Diagnosis not present

## 2017-01-27 DIAGNOSIS — I2510 Atherosclerotic heart disease of native coronary artery without angina pectoris: Secondary | ICD-10-CM | POA: Diagnosis not present

## 2017-01-27 DIAGNOSIS — I10 Essential (primary) hypertension: Secondary | ICD-10-CM

## 2017-01-27 NOTE — Patient Instructions (Signed)
Your physician wants you to follow-up in: ONE YEAR WITH DR CRENSHAW You will receive a reminder letter in the mail two months in advance. If you don't receive a letter, please call our office to schedule the follow-up appointment.   If you need a refill on your cardiac medications before your next appointment, please call your pharmacy.  

## 2017-02-13 ENCOUNTER — Other Ambulatory Visit: Payer: Self-pay | Admitting: Cardiology

## 2017-02-15 NOTE — Telephone Encounter (Signed)
Rx(s) sent to pharmacy electronically.  

## 2017-02-18 ENCOUNTER — Encounter: Payer: Self-pay | Admitting: Cardiology

## 2017-03-11 ENCOUNTER — Other Ambulatory Visit: Payer: Self-pay | Admitting: Cardiology

## 2017-05-28 ENCOUNTER — Inpatient Hospital Stay: Payer: Medicare Other | Attending: Adult Health | Admitting: Adult Health

## 2017-05-28 ENCOUNTER — Telehealth: Payer: Self-pay | Admitting: Adult Health

## 2017-05-28 ENCOUNTER — Encounter: Payer: Self-pay | Admitting: Adult Health

## 2017-05-28 VITALS — BP 131/60 | HR 54 | Temp 98.9°F | Resp 18 | Ht 62.5 in | Wt 209.0 lb

## 2017-05-28 DIAGNOSIS — Z79899 Other long term (current) drug therapy: Secondary | ICD-10-CM

## 2017-05-28 DIAGNOSIS — D0511 Intraductal carcinoma in situ of right breast: Secondary | ICD-10-CM

## 2017-05-28 DIAGNOSIS — Z86000 Personal history of in-situ neoplasm of breast: Secondary | ICD-10-CM

## 2017-05-28 DIAGNOSIS — F329 Major depressive disorder, single episode, unspecified: Secondary | ICD-10-CM

## 2017-05-28 DIAGNOSIS — Z1231 Encounter for screening mammogram for malignant neoplasm of breast: Secondary | ICD-10-CM

## 2017-05-28 DIAGNOSIS — Z7982 Long term (current) use of aspirin: Secondary | ICD-10-CM

## 2017-05-28 DIAGNOSIS — Z923 Personal history of irradiation: Secondary | ICD-10-CM | POA: Diagnosis not present

## 2017-05-28 DIAGNOSIS — E785 Hyperlipidemia, unspecified: Secondary | ICD-10-CM

## 2017-05-28 DIAGNOSIS — Z87891 Personal history of nicotine dependence: Secondary | ICD-10-CM | POA: Diagnosis not present

## 2017-05-28 DIAGNOSIS — I10 Essential (primary) hypertension: Secondary | ICD-10-CM

## 2017-05-28 NOTE — Telephone Encounter (Signed)
Gave patient AVs and calendar of upcoming may 2020 appointments °

## 2017-05-28 NOTE — [facility] Notes (Signed)
CLINIC:  Survivorship   REASON FOR VISIT:  Routine follow-up for history of breast cancer.   BRIEF ONCOLOGIC HISTORY:    Neoplasm of right breast, primary tumor staging category Tis: ductal carcinoma in situ (DCIS)   03/01/2008 Breast MRI    Right breast: Assymetric and smoothly marginated curvilinear area of enhancement in UIQ measuring 13 x 6 x 9 mm.  No suspicious findings in left breast. No axillary lympadenopathy.       03/19/2008 Breast US    Right breast: No solid or cystic mass, shadowing, or distortion detected. No palpable finding on physical exam to right breast.       03/26/2008 Initial Biopsy    Right breast needle core biopsy: Grade 1, DCIS. ER+ (100%), PR+ (100%).       03/26/2008 Initial Diagnosis    Neoplasm of right breast, primary tumor staging category Tis: ductal carcinoma in situ (DCIS)      04/24/2008 Surgery    Right breast lumpectomy Marlou Starks): Grade 1, DCIS, spanning 0.8 cm. Negative margins.       04/24/2008 Pathologic Stage    pTis, pNx, pMx: Stage 0      06/07/2008 - 07/03/2008 Radiation Therapy    Adjuvant RT completed Valere Dross).  Right breast: Total dose 45 Gy over 18 fractions. Right breast boost: 5 Gy over 2 fractions.       06/2008 - 06/2013 Anti-estrogen oral therapy    Completed 5 years of anti-estrogen therapy with Anastrazole (Magrinat)      07/28/2013 Procedure    Genetic counseling/testing: BreastNext gene panel negative. Genes tested include: ATM, BARD1, BRCA1/2, BRIP1, CDH1, CHEK2, MRE11A, MUTYH, NBN, NF1, PALB2, PTEN, RAD50, RAD51C, RAD51D, and TP53.       07/06/2014 Survivorship    Survivorship Care Plan given and reviewed with patient during in-person clinic visit.         INTERVAL HISTORY:  Carmen Haney presents to the Survivorship Clinic today for routine follow-up for her history of breast cancer.  She is accompanied by her husband Carmen Haney today.  Overall, she reports feeling quite well. She continues with annual mammograms.  She  denies any pain in her breast, but does note some mild tenderness with exam. She notes a rash under her breast.    Carmen Haney continues to struggle with anxiety and depression.  She is seeing a therapist once a week.  She is working on her coping mechanisms with her.  She continues to see her PCP regularly. She notes a sore toe today.     REVIEW OF SYSTEMS:  Review of Systems  Constitutional: Negative for appetite change, chills, fatigue, fever and unexpected weight change.  HENT:   Negative for hearing loss, lump/mass and trouble swallowing.   Eyes: Negative for eye problems and icterus.  Respiratory: Negative for chest tightness, cough and shortness of breath.   Cardiovascular: Negative for chest pain, leg swelling and palpitations.  Gastrointestinal: Positive for constipation (currently seeing GI and undergoing work up for this).  Endocrine: Negative for hot flashes.  Neurological: Negative for dizziness, extremity weakness, headaches and numbness.  Hematological: Negative for adenopathy. Does not bruise/bleed easily.  Psychiatric/Behavioral: Negative for depression. The patient is not nervous/anxious.    Breast: Denies any new nodularity, masses, tenderness, nipple changes, or nipple discharge.       PAST MEDICAL/SURGICAL HISTORY:  Past Medical History:  Diagnosis Date  . ACE-inhibitor cough   . Breast CA ([place])    s/p lumpectomy & radiation  . CAD (coronary  artery disease) 07/1998   s/p cath in 2000. L main with 30 to 40%  . Hearing loss   . HTN (hypertension)   . Hyperlipidemia   . Obesities, morbid ([place])   . Osteoporosis   . Personal history of radiation therapy   . Thyroid disease   . Vertebral fracture, pathological    Past Surgical History:  Procedure Laterality Date  . BREAST BIOPSY Left   . BREAST LUMPECTOMY  March 2010  . BREAST LUMPECTOMY WITH RADIOACTIVE SEED LOCALIZATION Left 09/20/2014   Procedure: LEFT BREAST LUMPECTOMY WITH RADIOACTIVE SEED LOCALIZATION;   Surgeon: Autumn Messing III, MD;  Location: [facility];  Service: General;  Laterality: Left;  . CARPAL TUNNEL RELEASE Right   . CESAREAN SECTION    . EYE SURGERY     Right Eye  . Skin Cancer Removed from Right Forearm    . TOTAL ABDOMINAL HYSTERECTOMY       ALLERGIES:  Allergies  Allergen Reactions  . Ace Inhibitors Cough  . Latex Rash     CURRENT MEDICATIONS:  Outpatient Encounter Medications as of 05/28/2017  Medication Sig Note  . aspirin 81 MG tablet Take 81 mg by mouth daily.     Marland Kitchen atenolol (TENORMIN) 25 MG tablet TAKE ONE TABLET BY MOUTH DAILY   . Calcium Carbonate-Vitamin D (CALCIUM + D PO) Take by mouth 2 (two) times daily.     . furosemide (LASIX) 20 MG tablet Take 1 tablet (20 mg total) by mouth daily.   Marland Kitchen levothyroxine (SYNTHROID, LEVOTHROID) 88 MCG tablet Take 88 mcg by mouth daily.    Marland Kitchen LORazepam (ATIVAN) 0.5 MG tablet Take 0.25 mg by mouth every 6 (six) hours as needed (Pt takes 0.5 mg tablet as needed).  11/25/2015: Received from: [facility]) Received Sig: 1 tablet as needed  . losartan (COZAAR) 50 MG tablet TAKE ONE TABLET BY MOUTH DAILY   . sertraline (ZOLOFT) 100 MG tablet Take 200 mg by mouth daily.  11/25/2015: Received from: External Pharmacy  . simvastatin (ZOCOR) 10 MG tablet Take 10 mg by mouth daily at 6 PM.    . [DISCONTINUED] olmesartan (BENICAR) 20 MG tablet Take 20 mg by mouth daily.      No facility-administered encounter medications on file as of 05/28/2017.      ONCOLOGIC FAMILY HISTORY:  Family History  Problem Relation Age of Onset  . Coronary artery disease Mother   . Hypertension Mother   . Melanoma Mother   . Cancer Mother 65       breast  . Breast cancer Mother   . Cancer Father 73       prostate  . Cancer Sister 55       breast  . Breast cancer Sister   . Cancer Maternal Aunt 33       breast  . Cancer Sister 79       breast  . Breast cancer Sister   . Breast  cancer Daughter 33      SOCIAL HISTORY:  Social History   Socioeconomic History  . Marital status: Married    Spouse name: Carmen Haney  . Number of children: Not on file  . Years of education: Not on file  . Highest education level: Not on file  Occupational History  . Occupation: Retired from [employer]    Employer: RETIRED  Social Needs  . Financial resource strain: Not on file  . Food insecurity:  Worry: Not on file    Inability: Not on file  . Transportation needs:    Medical: Not on file    Non-medical: Not on file  Tobacco Use  . Smoking status: Former Smoker    Types: Cigarettes    Last attempt to quit: 01/13/1963    Years since quitting: 54.4  . Smokeless tobacco: Never Used  . Tobacco comment: stopped in 1970's  Substance and Sexual Activity  . Alcohol use: No    Alcohol/week: 0.0 oz  . Drug use: No  . Sexual activity: Not on file  Lifestyle  . Physical activity:    Days per week: Not on file    Minutes per session: Not on file  . Stress: Not on file  Relationships  . Social connections:    Talks on phone: Not on file    Gets together: Not on file    Attends religious service: Not on file    Active member of club or organization: Not on file    Attends meetings of clubs or organizations: Not on file    Relationship status: Not on file  . Intimate partner violence:    Fear of current or ex partner: Not on file    Emotionally abused: Not on file    Physically abused: Not on file    Forced sexual activity: Not on file  Other Topics Concern  . Not on file  Social History Narrative  . Not on file      PHYSICAL EXAMINATION:  Vital Signs: Vitals:   05/28/17 1029  BP: 131/60  Pulse: (!) 54  Resp: 18  Temp: 98.9 F (37.2 C)  SpO2: 94%   Filed Weights   05/28/17 1029  Weight: 209 lb (94.8 kg)   General: Well-nourished, well-appearing female in no acute distress.     HEENT: Head is normocephalic.  Pupils equal and reactive to light. Conjunctivae clear  without exudate.  Sclerae anicteric. Oral mucosa is pink, moist.  Oropharynx is pink without lesions or erythema.  Lymph: No cervical, supraclavicular, or infraclavicular lymphadenopathy noted on palpation.  Cardiovascular: Regular rate and rhythm.Marland Kitchen Respiratory: Clear to auscultation bilaterally. Chest expansion symmetric; breathing non-labored.  Breast Exam:  -Left breast: No appreciable masses on palpation. No skin redness, thickening, or peau d'orange appearance; no nipple retraction or nipple discharge;red fungal like rash noted on later left breast fold  -Right breast: No appreciable masses on palpation. No skin redness, thickening, or peau d'orange appearance; no nipple retraction or nipple discharge; mild distortion in symmetry at previous lumpectomy site well healed scar without erythema or nodularity. -Axilla: No axillary adenopathy bilaterally.  GI: Abdomen soft and round; non-tender, non-distended. Bowel sounds normoactive. No hepatosplenomegaly.   GU: Deferred.  Neuro: No focal deficits. Steady gait.  Psych: Mood and affect normal and appropriate for situation.  MSK: No focal spinal tenderness to palpation, full range of motion in bilateral upper extremities Extremities: No edema. Skin: Warm and dry.  LABORATORY DATA:  None for this visit   DIAGNOSTIC IMAGING:  Most recent mammogram:    Bone density from 02/12/2017:     ASSESSMENT AND PLAN:  Ms.. Petrea is a pleasant 84 y.o. female with history of Stage 0 right breast DCIS, ER+/PR+, diagnosed in 03/2008, treated with lumpectomy, adjuvant radiation therapy, and 5 years of anti estrogen therapy with Anastrozole completed in 06/2013.  She presents to the Survivorship Clinic for surveillance and routine follow-up.   1. History of breast cancer:  Ms. Elman is currently  clinically and radiographically without evidence of disease or recurrence of breast cancer. I ordered a breast MRI today, as she has 2 or more first degree relatives  with breast cancer, and has previously had MRI's done as additional screening.  She will be due for mammogram in 11/2017; orders placed today.  I will see her back in one year for LTS follow up.  I encouraged her to call me with any questions or concerns before her next visit at the cancer center, and I would be happy to see her sooner, if needed.    2. Bone health:  Given Ms. Romberger's age, history of breast cancer, and her previous anti-estrogen therapy with Anastrozole, she is at risk for bone demineralization. Her last DEXA scan was on 02/12/2017 and was consistent with ostepenia with a t score in the right femoral neck of -1.9.  Her bone density is slightly improved from prior in 2017.  In the meantime, she was encouraged to increase her consumption of foods rich in calcium, as well as increase her weight-bearing activities.  She was given education on specific food and activities to promote bone health.  3. Cancer screening:  Due to Ms. Cockrum's history and her age, she should receive screening for skin cancers. She was encouraged to follow-up with her PCP for appropriate cancer screenings.   4. Health maintenance and wellness promotion: Ms. Maldonado was encouraged to consume 5-7 servings of fruits and vegetables per day. She was also encouraged to engage in moderate to vigorous exercise for 30 minutes per day most days of the week. She was instructed to limit her alcohol consumption and continue to abstain from tobacco use.      Dispo:  -Return to cancer center in one year for LTS follow up -MRI breast next available -Mammogram in 11/2017   A total of (30) minutes of face-to-face time was spent with this patient with greater than 50% of that time in counseling and care-coordination.   Gardenia Phlegm, [place] [phone]   Note: PRIMARY CARE PROVIDER Kelton Pillar, [place] [phone]

## 2017-10-08 ENCOUNTER — Telehealth: Payer: Self-pay

## 2017-10-08 ENCOUNTER — Ambulatory Visit
Admission: RE | Admit: 2017-10-08 | Discharge: 2017-10-08 | Disposition: A | Discharge status: Home / Self Care | Payer: Medicare Other | Source: Ambulatory Visit | Attending: Adult Health | Admitting: Adult Health

## 2017-10-08 DIAGNOSIS — Z1231 Encounter for screening mammogram for malignant neoplasm of breast: Secondary | ICD-10-CM

## 2017-10-08 DIAGNOSIS — D0511 Intraductal carcinoma in situ of right breast: Secondary | ICD-10-CM

## 2017-10-08 MED ORDER — GADOBENATE DIMEGLUMINE 529 MG/ML IV SOLN
20.0000 mL | Freq: Once | INTRAVENOUS | Status: AC | PRN
  Administered 2017-10-08: 20 mL via INTRAVENOUS

## 2017-10-08 NOTE — Telephone Encounter (Signed)
Spoke with pt informing of normal MRI results.  Pt voiced understanding and thanks.

## 2017-11-18 ENCOUNTER — Telehealth: Payer: Self-pay | Admitting: Cardiology

## 2017-11-18 NOTE — Telephone Encounter (Signed)
Spoke with pt and her husband. Pt was seem at the ophthalmologist  today for an eye laser procedure. Pt sts that after the procedure they were monitoring her BP, the first reading her systolic was 626 she doesn't remember the bottom number, and her HR was 46bpm. When rechecked her BP was 185/80 40bpm.  Pt sts that she rechecked it when she returned home using her BP machine and got 111/60 46bpm. I asked the pt to ck her BP while I held on the phone, the reading was 95/64 46bpm.  Pt sts that she was told to f/u with her Cardiologist especially about her heart rate. Adv pt that she is on a betablocker med and this cam cause her to have a slower HR. Pt is currently asymptomatic, but would like to be seen. appt scheduled on 11/11 @ 2pm with Dr.Crenshaw adv pt to monitor her BP and HR twice daily until her appt.adv her to bring the readings and her BP machine with her to her o/v so that it can be checked for accuracy. Adv pt to contact the office sooner if symptoms develop. Pt agreeable with plan and verbalized understanding.

## 2017-11-18 NOTE — Telephone Encounter (Signed)
New Message         Pt c/o BP issue: STAT if pt c/o blurred vision, one-sided weakness or slurred speech  1. What are your last 5 BP readings?       205/185BP     HR 46   Taken by the speciality  surgical center  2. Are you having any other symptoms (ex. Dizziness, headache, blurred vision, passed out)? Buzzie Headache  3. What is your BP issue? High

## 2017-11-19 ENCOUNTER — Encounter: Payer: Self-pay | Admitting: Cardiology

## 2017-11-22 ENCOUNTER — Ambulatory Visit: Payer: Medicare Other | Admitting: Cardiology

## 2017-11-22 ENCOUNTER — Encounter: Payer: Self-pay | Admitting: Cardiology

## 2017-11-22 VITALS — BP 126/78 | HR 50 | Ht 62.5 in | Wt 210.0 lb

## 2017-11-22 DIAGNOSIS — E7800 Pure hypercholesterolemia, unspecified: Secondary | ICD-10-CM

## 2017-11-22 DIAGNOSIS — I2510 Atherosclerotic heart disease of native coronary artery without angina pectoris: Secondary | ICD-10-CM | POA: Diagnosis not present

## 2017-11-22 DIAGNOSIS — I10 Essential (primary) hypertension: Secondary | ICD-10-CM

## 2017-11-22 NOTE — [facility] Notes (Signed)
HPI: FU coronary disease. Patient had a cardiac catheterization in 2002 that showed a 40% left main. In August 2015 pt had myoview that showed EF 64 with no ischemia. Carotid dopplers6/17normal. Patient seen for syncope June2017. Monitor showed sinus with rare PAC and PVC. Since she was last seen she denies chest pain, palpitations or syncope.  Current Outpatient Medications  Medication Sig Dispense Refill  . aspirin 81 MG tablet Take 81 mg by mouth daily.      Marland Kitchen atenolol (TENORMIN) 25 MG tablet TAKE ONE TABLET BY MOUTH DAILY 90 tablet 3  . Calcium Carbonate-Vitamin D (CALCIUM + D PO) Take by mouth 2 (two) times daily.      . furosemide (LASIX) 20 MG tablet Take 1 tablet (20 mg total) by mouth daily. 90 tablet 3  . levothyroxine (SYNTHROID, LEVOTHROID) 88 MCG tablet Take 88 mcg by mouth daily.     Marland Kitchen LORazepam (ATIVAN) 0.5 MG tablet Take 0.25 mg by mouth every 6 (six) hours as needed (Pt takes 0.5 mg tablet as needed).     . losartan (COZAAR) 50 MG tablet TAKE ONE TABLET BY MOUTH DAILY 90 tablet 3  . sertraline (ZOLOFT) 100 MG tablet Take 200 mg by mouth daily.     . simvastatin (ZOCOR) 10 MG tablet Take 10 mg by mouth daily at 6 PM.      No current facility-administered medications for this visit.      Past Medical History:  Diagnosis Date  . ACE-inhibitor cough   . Breast CA ([place])    s/p lumpectomy & radiation  . CAD (coronary artery disease) 07/1998   s/p cath in 2000. L main with 30 to 40%  . Hearing loss   . HTN (hypertension)   . Hyperlipidemia   . Obesities, morbid ([place])   . Osteoporosis   . Personal history of radiation therapy   . Thyroid disease   . Vertebral fracture, pathological     Past Surgical History:  Procedure Laterality Date  . BREAST BIOPSY Left   . BREAST LUMPECTOMY  March 2010  . BREAST LUMPECTOMY WITH RADIOACTIVE SEED LOCALIZATION Left 09/20/2014   Procedure: LEFT BREAST LUMPECTOMY WITH RADIOACTIVE SEED LOCALIZATION;  Surgeon: Autumn Messing III,  MD;  Location: [facility];  Service: General;  Laterality: Left;  . CARPAL TUNNEL RELEASE Right   . CESAREAN SECTION    . EYE SURGERY     Right Eye  . Skin Cancer Removed from Right Forearm    . TOTAL ABDOMINAL HYSTERECTOMY      Social History   Socioeconomic History  . Marital status: Married    Spouse name: Tressie Ellis  . Number of children: Not on file  . Years of education: Not on file  . Highest education level: Not on file  Occupational History  . Occupation: Retired from [employer]    Employer: RETIRED  Social Needs  . Financial resource strain: Not on file  . Food insecurity:    Worry: Not on file    Inability: Not on file  . Transportation needs:    Medical: Not on file    Non-medical: Not on file  Tobacco Use  . Smoking status: Former Smoker    Types: Cigarettes    Last attempt to quit: 01/13/1963    Years since quitting: 54.8  . Smokeless tobacco: Never Used  . Tobacco comment: stopped in 1970's  Substance and Sexual Activity  . Alcohol use: No    Alcohol/week: 0.0  standard drinks  . Drug use: No  . Sexual activity: Not on file  Lifestyle  . Physical activity:    Days per week: Not on file    Minutes per session: Not on file  . Stress: Not on file  Relationships  . Social connections:    Talks on phone: Not on file    Gets together: Not on file    Attends religious service: Not on file    Active member of club or organization: Not on file    Attends meetings of clubs or organizations: Not on file    Relationship status: Not on file  . Intimate partner violence:    Fear of current or ex partner: Not on file    Emotionally abused: Not on file    Physically abused: Not on file    Forced sexual activity: Not on file  Other Topics Concern  . Not on file  Social History Narrative  . Not on file    Family History  Problem Relation Age of Onset  . Coronary artery disease Mother   . Hypertension Mother   . Melanoma Mother   . Cancer Mother 62         breast  . Breast cancer Mother   . Cancer Father 76       prostate  . Cancer Sister 38       breast  . Breast cancer Sister   . Cancer Maternal Aunt 14       breast  . Cancer Sister 11       breast  . Breast cancer Sister   . Breast cancer Daughter 52    ROS: no fevers or chills, productive cough, hemoptysis, dysphasia, odynophagia, melena, hematochezia, dysuria, hematuria, rash, seizure activity, orthopnea, PND, pedal edema, claudication. Remaining systems are negative.  Physical Exam: Well-developed well-nourished in no acute distress.  Skin is warm and dry.  HEENT is normal.  Neck is supple.  Chest is clear to auscultation with normal expansion.  Cardiovascular exam is regular rate and rhythm.  Abdominal exam nontender or distended. No masses palpated. Extremities show no edema. neuro grossly intact  ECG- Sinus bradycardia, LAFB; personally reviewed  A/P  1 coronary artery disease-patient denies chest pain.  Continue medical therapy with aspirin and statin.  2 hypertension-patient's blood pressure appears to be controlled.  However her heart rate has been running in the 40s at times.  I will discontinue atenolol and follow.  If blood pressure increases we will advance losartan.  3 hyperlipidemia-continue statin.  Lipids and liver monitored by primary care.  Kirk Ruths, MD

## 2017-11-22 NOTE — Patient Instructions (Signed)
Medication Instructions:  Your physician has recommended you make the following change in your medication:  STOP Atenolol  If you need a refill on your cardiac medications before your next appointment, please call your pharmacy.   Lab work: None ordered   Testing/Procedures: None ordered  Follow-Up: At [facility], you and your health needs are our priority.  As part of our continuing mission to provide you with exceptional heart care, we have created designated Provider Care Teams.  These Care Teams include your primary Cardiologist (physician) and Advanced Practice Providers (APPs -  Physician Assistants and Nurse Practitioners) who all work together to provide you with the care you need, when you need it. You will need a follow up appointment in 6 months.  Please call our office 2 months in advance to schedule this appointment.  You may see Kirk Ruths, MD or one of the following Advanced Practice Providers on your designated Care Team:   Kerin Ransom, PA-C Roby Lofts, [place] . Sande Rives, PA-C  Any Other Special Instructions Will Be Listed Below (If Applicable). Your physician has requested that you regularly monitor and record your blood pressure readings and heart rate  at home. Please use the same machine at the same time of day to check your readings and record them to bring to your follow-up visit.  Please call the office if your blood pressure is consistently elevated.

## 2017-12-18 ENCOUNTER — Other Ambulatory Visit: Payer: Self-pay | Admitting: Cardiology

## 2018-03-18 ENCOUNTER — Other Ambulatory Visit: Payer: Self-pay | Admitting: Cardiology

## 2018-05-02 ENCOUNTER — Telehealth: Payer: Self-pay | Admitting: Adult Health

## 2018-05-02 NOTE — Telephone Encounter (Signed)
Rescheduled 5/29 appt to 5/27 and changed to phone visit per sch msg. Called and spoke with patient. Confirmed changes

## 2018-06-07 NOTE — Progress Notes (Signed)
SURVIVORSHIP VIRTUAL VISIT:  I connected with Carmen Haney on 06/07/18 at 11:30 AM EDT by telephone and verified that I am speaking with the correct person using two identifiers.   I discussed the limitations, risks, security and privacy concerns of performing an evaluation and management service by telephone and the availability of in person appointments. I also discussed with the patient that there may be a patient responsible charge related to this service. The patient expressed understanding and agreed to proceed.   REASON FOR VISIT:  Routine follow-up for history of breast cancer.   BRIEF ONCOLOGIC HISTORY:    Neoplasm of right breast, primary tumor staging category Tis: ductal carcinoma in situ (DCIS)   03/01/2008 Breast MRI    Right breast: Assymetric and smoothly marginated curvilinear area of enhancement in UIQ measuring 13 x 6 x 9 mm.  No suspicious findings in left breast. No axillary lympadenopathy.     03/19/2008 Breast US    Right breast: No solid or cystic mass, shadowing, or distortion detected. No palpable finding on physical exam to right breast.     03/26/2008 Initial Biopsy    Right breast needle core biopsy: Grade 1, DCIS. ER+ (100%), PR+ (100%).     03/26/2008 Initial Diagnosis    Neoplasm of right breast, primary tumor staging category Tis: ductal carcinoma in situ (DCIS)    04/24/2008 Surgery    Right breast lumpectomy Marlou Starks): Grade 1, DCIS, spanning 0.8 cm. Negative margins.     04/24/2008 Pathologic Stage    pTis, pNx, pMx: Stage 0    06/07/2008 - 07/03/2008 Radiation Therapy    Adjuvant RT completed Valere Dross).  Right breast: Total dose 45 Gy over 18 fractions. Right breast boost: 5 Gy over 2 fractions.     06/2008 - 06/2013 Anti-estrogen oral therapy    Completed 5 years of anti-estrogen therapy with Anastrazole (Magrinat)    07/28/2013 Procedure    Genetic counseling/testing: BreastNext gene panel negative. Genes tested include: ATM, BARD1, BRCA1/2, BRIP1, CDH1,  CHEK2, MRE11A, MUTYH, NBN, NF1, PALB2, PTEN, RAD50, RAD51C, RAD51D, and TP53.     07/06/2014 Survivorship    Survivorship Care Plan given and reviewed with patient during in-person clinic visit.       INTERVAL HISTORY:  Carmen Haney presents to the Survivorship Clinic today for routine follow-up for her history of breast cancer.  Overall, she reports feeling quite well. Her husband unfortunately had a MI about 3 weeks ago and he has been recovering from that well.  She notes she has great support from her two daughters who live nearby.  She is very catious and is practicing strict social distancing.  She has only left her house once in the past two months and that was to eat dinner at her daughter's house on [facility] Day with a table set away with appropriate social distancing in place.    Carmen Haney notes that she has h/o depression and anxiety.  She is taking Zoloft daily which keeps this at [place].  She has Lorazepam PRN she takes for anxiety/panic attacks and notes she hasn't needed it in 3 months.  Carmen Haney undergoes counseling with cognitive behavioral therapy and has learned techniques to cope with anxiety to reduce her concerns.      REVIEW OF SYSTEMS:  Review of Systems  Constitutional: Negative for appetite change, chills, fatigue, fever and unexpected weight change.  HENT:   Negative for hearing loss, lump/mass, sore throat and trouble swallowing.   Eyes: Negative for eye problems and icterus.  Respiratory: Negative for chest tightness, cough and shortness of breath.   Cardiovascular: Negative for chest pain, leg swelling and palpitations.  Gastrointestinal: Negative for abdominal distention, abdominal pain, blood in stool, constipation, diarrhea, nausea, rectal pain and vomiting.  Endocrine: Negative for hot flashes.  Musculoskeletal: Negative for arthralgias.  Skin: Negative for itching and rash.  Neurological: Negative for dizziness, extremity weakness, headaches and numbness.   Hematological: Negative for adenopathy. Does not bruise/bleed easily.  Psychiatric/Behavioral: Positive for depression (controlled with zoloft). The patient is nervous/anxious (controlled with learned CBT and has PRN lorazepam).   Breast: Denies any new nodularity, masses, tenderness, nipple changes, or nipple discharge.       PAST MEDICAL/SURGICAL HISTORY:  Past Medical History:  Diagnosis Date  . ACE-inhibitor cough   . Breast CA ([place])    s/p lumpectomy & radiation  . CAD (coronary artery disease) 07/1998   s/p cath in 2000. L main with 30 to 40%  . Hearing loss   . HTN (hypertension)   . Hyperlipidemia   . Obesities, morbid ([place])   . Osteoporosis   . Personal history of radiation therapy   . Thyroid disease   . Vertebral fracture, pathological    Past Surgical History:  Procedure Laterality Date  . BREAST BIOPSY Left   . BREAST LUMPECTOMY  March 2010  . BREAST LUMPECTOMY WITH RADIOACTIVE SEED LOCALIZATION Left 09/20/2014   Procedure: LEFT BREAST LUMPECTOMY WITH RADIOACTIVE SEED LOCALIZATION;  Surgeon: Autumn Messing III, MD;  Location: [place];  Service: General;  Laterality: Left;  . CARPAL TUNNEL RELEASE Right   . CESAREAN SECTION    . EYE SURGERY     Right Eye  . Skin Cancer Removed from Right Forearm    . TOTAL ABDOMINAL HYSTERECTOMY       ALLERGIES:  Allergies  Allergen Reactions  . Ace Inhibitors Cough  . Latex Rash     CURRENT MEDICATIONS:  Outpatient Encounter Medications as of 06/08/2018  Medication Sig Note  . aspirin 81 MG tablet Take 81 mg by mouth daily.     Marland Kitchen atenolol (TENORMIN) 25 MG tablet TAKE ONE TABLET BY MOUTH DAILY   . Calcium Carbonate-Vitamin D (CALCIUM + D PO) Take by mouth 2 (two) times daily.     . furosemide (LASIX) 20 MG tablet TAKE ONE TABLET BY MOUTH DAILY   . levothyroxine (SYNTHROID, LEVOTHROID) 88 MCG tablet Take 88 mcg by mouth daily.    Marland Kitchen LORazepam (ATIVAN) 0.5 MG tablet Take 0.25 mg by mouth every 6 (six) hours  as needed (Pt takes 0.5 mg tablet as needed).  11/25/2015: Received from: [facility]) Received Sig: 1 tablet as needed  . losartan (COZAAR) 50 MG tablet TAKE ONE TABLET BY MOUTH DAILY   . sertraline (ZOLOFT) 100 MG tablet Take 200 mg by mouth daily.  11/25/2015: Received from: External Pharmacy  . simvastatin (ZOCOR) 10 MG tablet Take 10 mg by mouth daily at 6 PM.    . [DISCONTINUED] olmesartan (BENICAR) 20 MG tablet Take 20 mg by mouth daily.      No facility-administered encounter medications on file as of 06/08/2018.      ONCOLOGIC FAMILY HISTORY:  Family History  Problem Relation Age of Onset  . Coronary artery disease Mother   . Hypertension Mother   . Melanoma Mother   . Cancer Mother 38       breast  . Breast cancer Mother   . Cancer Father  78       prostate  . Cancer Sister 20       breast  . Breast cancer Sister   . Cancer Maternal Aunt 62       breast  . Cancer Sister 60       breast  . Breast cancer Sister   . Breast cancer Daughter 2    GENETIC COUNSELING/TESTING: Declined   SOCIAL HISTORY:  Social History   Socioeconomic History  . Marital status: Married    Spouse name: Tressie Ellis  . Number of children: Not on file  . Years of education: Not on file  . Highest education level: Not on file  Occupational History  . Occupation: Retired from [employer]    Employer: RETIRED  Social Needs  . Financial resource strain: Not on file  . Food insecurity:    Worry: Not on file    Inability: Not on file  . Transportation needs:    Medical: Not on file    Non-medical: Not on file  Tobacco Use  . Smoking status: Former Smoker    Types: Cigarettes    Last attempt to quit: 01/13/1963    Years since quitting: 55.4  . Smokeless tobacco: Never Used  . Tobacco comment: stopped in 1970's  Substance and Sexual Activity  . Alcohol use: No    Alcohol/week: 0.0 standard drinks  . Drug use: No  . Sexual activity: Not on  file  Lifestyle  . Physical activity:    Days per week: Not on file    Minutes per session: Not on file  . Stress: Not on file  Relationships  . Social connections:    Talks on phone: Not on file    Gets together: Not on file    Attends religious service: Not on file    Active member of club or organization: Not on file    Attends meetings of clubs or organizations: Not on file    Relationship status: Not on file  . Intimate partner violence:    Fear of current or ex partner: Not on file    Emotionally abused: Not on file    Physically abused: Not on file    Forced sexual activity: Not on file  Other Topics Concern  . Not on file  Social History Narrative  . Not on file      OBJECTIVE:  Patient sounds well, she is in no apparent distress.  Speech is normal, breathing non labored, mood and behavior are normal.    LABORATORY DATA:  None for this visit   DIAGNOSTIC IMAGING:  Most recent mammogram:    Breast MRI:   MOST RECENT BONE DENSITY:     ASSESSMENT AND PLAN:  Carmen Haney is a pleasant 85 y.o. female with history of Stage 0 right breast DCIS, ER+/PR+, diagnosed in 02/2008, treated with lumpectomy, adjuvant radiation therapy, and anti-estrogen therapy with Anastrozole x 5 years completed in 06/2013.  She presents to the Survivorship Clinic for surveillance and routine follow-up.   1. History of breast cancer:  Carmen Haney is currently clinically and radiographically without evidence of disease or recurrence of breast cancer. She is overdue for mammogram; orders placed today.She is also having biennial breast MRI for strong family history of breast cancer.  She will return in one year for LTS follow up.  I encouraged her to call me with any questions or concerns before her next visit at the cancer center, and I would be happy to see her  sooner, if needed.    2. Bone health:  Given Carmen Haney's age, history of breast cancer, and her previous anti-estrogen therapy with  Anastrozole, she is at risk for bone demineralization. Her last DEXA scan was on 02/12/2017 and was consistent with osteopenia.  She will be due for repeat bone density in 02/2019.  In the meantime, she was encouraged to increase her consumption of foods rich in calcium, as well as increase her weight-bearing activities.  She was given education on specific food and activities to promote bone health.  3. Cancer screening:  Due to Carmen Haney's history and her age, she should receive screening for skin cancers, colon cancers. She plans on following up with GI when it is safe.  She was encouraged to follow-up with her PCP for appropriate cancer screenings.   4. Health maintenance and wellness promotion: Carmen Haney was encouraged to consume 5-7 servings of fruits and vegetables per day. She was also encouraged to engage in moderate to vigorous exercise for 30 minutes per day most days of the week. She was instructed to limit her alcohol consumption and continue to abstain from tobacco use.   Follow up instructions:    -Return to cancer center in one year for LTS follow up  -Mammogram due   The patient was provided an opportunity to ask questions and all were answered. The patient agreed with the plan and demonstrated an understanding of the instructions.   The patient was advised to call back or seek an in-person evaluation if the symptoms worsen or if the condition fails to improve as anticipated.   I provided 29 minutes of non face-to-face telephone visit time during this encounter, and > 50% was spent counseling as documented under my assessment & plan.   Gardenia Phlegm, [place] [phone]   Note: PRIMARY CARE PROVIDER Kelton Pillar, [place] [phone]

## 2018-06-08 ENCOUNTER — Inpatient Hospital Stay: Payer: Medicare Other | Attending: Adult Health | Admitting: Adult Health

## 2018-06-08 ENCOUNTER — Encounter: Payer: Self-pay | Admitting: Adult Health

## 2018-06-08 DIAGNOSIS — Z170 Estrogen receptor positive status [ER+]: Secondary | ICD-10-CM | POA: Diagnosis not present

## 2018-06-08 DIAGNOSIS — Z79811 Long term (current) use of aromatase inhibitors: Secondary | ICD-10-CM | POA: Diagnosis not present

## 2018-06-08 DIAGNOSIS — D0511 Intraductal carcinoma in situ of right breast: Secondary | ICD-10-CM | POA: Diagnosis not present

## 2018-06-08 DIAGNOSIS — Z7982 Long term (current) use of aspirin: Secondary | ICD-10-CM

## 2018-06-08 DIAGNOSIS — I10 Essential (primary) hypertension: Secondary | ICD-10-CM

## 2018-06-08 DIAGNOSIS — Z1239 Encounter for other screening for malignant neoplasm of breast: Secondary | ICD-10-CM

## 2018-06-08 DIAGNOSIS — Z923 Personal history of irradiation: Secondary | ICD-10-CM | POA: Diagnosis not present

## 2018-06-08 DIAGNOSIS — Z79899 Other long term (current) drug therapy: Secondary | ICD-10-CM

## 2018-06-09 ENCOUNTER — Telehealth: Payer: Self-pay | Admitting: Adult Health

## 2018-06-09 NOTE — Telephone Encounter (Signed)
Called regarding schedule °

## 2018-06-10 ENCOUNTER — Encounter: Payer: Medicare Other | Admitting: Adult Health

## 2018-06-21 ENCOUNTER — Encounter: Payer: Self-pay | Admitting: *Deleted

## 2018-07-05 ENCOUNTER — Ambulatory Visit: Payer: Medicare Other | Admitting: Cardiology

## 2018-08-10 ENCOUNTER — Ambulatory Visit: Payer: Medicare Other

## 2018-09-11 ENCOUNTER — Other Ambulatory Visit: Payer: Self-pay | Admitting: Cardiology

## 2018-10-04 NOTE — Progress Notes (Signed)
HPI: FU coronary disease. Patient had a cardiac catheterization in 2002 that showed a 40% left main. In August 2015 pt had myoview that showed EF 64 with no ischemia. Carotid dopplers6/17 normal. Patient seen for syncope June2017. Monitor showed sinus with rare PAC and PVC. Since she was last seen  she has dyspnea on exertion which is unchanged.  No orthopnea, PND, pedal edema, chest pain or syncope.  Current Outpatient Medications  Medication Sig Dispense Refill  . aspirin 81 MG tablet Take 81 mg by mouth daily.      . Calcium Carbonate-Vitamin D (CALCIUM + D PO) Take by mouth 2 (two) times daily.      . furosemide (LASIX) 20 MG tablet TAKE ONE TABLET BY MOUTH DAILY 90 tablet 2  . levothyroxine (SYNTHROID, LEVOTHROID) 88 MCG tablet Take 88 mcg by mouth daily.     Marland Kitchen LORazepam (ATIVAN) 0.5 MG tablet Take 0.25 mg by mouth every 6 (six) hours as needed (Pt takes 0.5 mg tablet as needed).     . losartan (COZAAR) 50 MG tablet TAKE ONE TABLET BY MOUTH DAILY 90 tablet 0  . sertraline (ZOLOFT) 100 MG tablet Take 200 mg by mouth daily.     . simvastatin (ZOCOR) 10 MG tablet Take 10 mg by mouth daily at 6 PM.      No current facility-administered medications for this visit.      Past Medical History:  Diagnosis Date  . ACE-inhibitor cough   . Breast CA ([place])    s/p lumpectomy & radiation  . CAD (coronary artery disease) 07/1998   s/p cath in 2000. L main with 30 to 40%  . Hearing loss   . HTN (hypertension)   . Hyperlipidemia   . Obesities, morbid ([place])   . Osteoporosis   . Personal history of radiation therapy   . Thyroid disease   . Vertebral fracture, pathological     Past Surgical History:  Procedure Laterality Date  . BREAST BIOPSY Left   . BREAST LUMPECTOMY  March 2010  . BREAST LUMPECTOMY WITH RADIOACTIVE SEED LOCALIZATION Left 09/20/2014   Procedure: LEFT BREAST LUMPECTOMY WITH RADIOACTIVE SEED LOCALIZATION;  Surgeon: Autumn Messing III, MD;  Location: [place];  Service: General;  Laterality: Left;  . CARPAL TUNNEL RELEASE Right   . CESAREAN SECTION    . EYE SURGERY     Right Eye  . Skin Cancer Removed from Right Forearm    . TOTAL ABDOMINAL HYSTERECTOMY      Social History   Socioeconomic History  . Marital status: Married    Spouse name: Tressie Ellis  . Number of children: Not on file  . Years of education: Not on file  . Highest education level: Not on file  Occupational History  . Occupation: Retired from [employer]    Employer: RETIRED  Social Needs  . Financial resource strain: Not on file  . Food insecurity    Worry: Not on file    Inability: Not on file  . Transportation needs    Medical: Not on file    Non-medical: Not on file  Tobacco Use  . Smoking status: Former Smoker    Types: Cigarettes    Quit date: 01/13/1963    Years since quitting: 55.7  . Smokeless tobacco: Never Used  . Tobacco comment: stopped in 1970's  Substance and Sexual Activity  . Alcohol use: No    Alcohol/week: 0.0 standard drinks  . Drug use: No  .  Sexual activity: Not on file  Lifestyle  . Physical activity    Days per week: Not on file    Minutes per session: Not on file  . Stress: Not on file  Relationships  . Social Herbalist on phone: Not on file    Gets together: Not on file    Attends religious service: Not on file    Active member of club or organization: Not on file    Attends meetings of clubs or organizations: Not on file    Relationship status: Not on file  . Intimate partner violence    Fear of current or ex partner: Not on file    Emotionally abused: Not on file    Physically abused: Not on file    Forced sexual activity: Not on file  Other Topics Concern  . Not on file  Social History Narrative  . Not on file    Family History  Problem Relation Age of Onset  . Coronary artery disease Mother   . Hypertension Mother   . Melanoma Mother   . Cancer Mother 28       breast  . Breast cancer Mother   . Cancer  Father 71       prostate  . Cancer Sister 13       breast  . Breast cancer Sister   . Cancer Maternal Aunt 84       breast  . Cancer Sister 37       breast  . Breast cancer Sister   . Breast cancer Daughter 15    ROS: Problems with knee pain but no fevers or chills, productive cough, hemoptysis, dysphasia, odynophagia, melena, hematochezia, dysuria, hematuria, rash, seizure activity, orthopnea, PND, pedal edema, claudication. Remaining systems are negative.  Physical Exam: Well-developed obeser in no acute distress.  Skin is warm and dry.  HEENT is normal.  Neck is supple.  Chest is clear to auscultation with normal expansion.  Cardiovascular exam is regular rate and rhythm.  Abdominal exam nontender or distended. No masses palpated. Extremities show no edema. neuro grossly intact  ECG-sinus rhythm at a rate of 77, left anterior fascicular block.  Personally reviewed  A/P  1 coronary artery disease-patient has not had recurrent chest pain.  Continue medical therapy with aspirin and statin.  2 hyperlipidemia-continue statin.  Lipids and liver monitored by primary care.  3 hypertension-patient's blood pressure is controlled.  Continue present medications and follow.  Note her beta-blocker was discontinued previously because of bradycardia.  Kirk Ruths, MD

## 2018-10-06 ENCOUNTER — Other Ambulatory Visit: Payer: Self-pay

## 2018-10-06 ENCOUNTER — Encounter: Payer: Self-pay | Admitting: Cardiology

## 2018-10-06 ENCOUNTER — Ambulatory Visit (INDEPENDENT_AMBULATORY_CARE_PROVIDER_SITE_OTHER): Payer: Medicare Other | Admitting: Cardiology

## 2018-10-06 VITALS — BP 108/72 | HR 77 | Temp 96.6°F | Ht 62.5 in | Wt 200.0 lb

## 2018-10-06 DIAGNOSIS — I2510 Atherosclerotic heart disease of native coronary artery without angina pectoris: Secondary | ICD-10-CM

## 2018-10-06 DIAGNOSIS — E7800 Pure hypercholesterolemia, unspecified: Secondary | ICD-10-CM

## 2018-10-06 DIAGNOSIS — I10 Essential (primary) hypertension: Secondary | ICD-10-CM | POA: Diagnosis not present

## 2018-10-06 NOTE — Patient Instructions (Signed)
Medication Instructions:  NO CHANGE If you need a refill on your cardiac medications before your next appointment, please call your pharmacy.   Lab work: If you have labs (blood work) drawn today and your tests are completely normal, you will receive your results only by: . MyChart Message (if you have MyChart) OR . A paper copy in the mail If you have any lab test that is abnormal or we need to change your treatment, we will call you to review the results.  Follow-Up: At [facility], you and your health needs are our priority.  As part of our continuing mission to provide you with exceptional heart care, we have created designated Provider Care Teams.  These Care Teams include your primary Cardiologist (physician) and Advanced Practice Providers (APPs -  Physician Assistants and Nurse Practitioners) who all work together to provide you with the care you need, when you need it. You will need a follow up appointment in 12 months.  Please call our office 2 months in advance to schedule this appointment.  You may see Brian Crenshaw, MD or one of the following Advanced Practice Providers on your designated Care Team:   Luke Kilroy, PA-C Krista Kroeger, PA-C . Callie Goodrich, PA-C     

## 2018-10-14 ENCOUNTER — Ambulatory Visit
Admission: RE | Admit: 2018-10-14 | Discharge: 2018-10-14 | Disposition: A | Discharge status: Home / Self Care | Payer: Medicare Other | Source: Ambulatory Visit | Attending: Adult Health | Admitting: Adult Health

## 2018-10-14 ENCOUNTER — Other Ambulatory Visit: Payer: Self-pay

## 2018-10-14 DIAGNOSIS — Z1239 Encounter for other screening for malignant neoplasm of breast: Secondary | ICD-10-CM

## 2018-10-17 ENCOUNTER — Other Ambulatory Visit: Payer: Self-pay | Admitting: Adult Health

## 2018-10-17 DIAGNOSIS — R928 Other abnormal and inconclusive findings on diagnostic imaging of breast: Secondary | ICD-10-CM

## 2018-10-20 ENCOUNTER — Other Ambulatory Visit: Payer: Self-pay | Admitting: Adult Health

## 2018-10-20 ENCOUNTER — Ambulatory Visit
Admission: RE | Admit: 2018-10-20 | Discharge: 2018-10-20 | Disposition: A | Discharge status: Home / Self Care | Payer: Medicare Other | Source: Ambulatory Visit | Attending: Adult Health | Admitting: Adult Health

## 2018-10-20 ENCOUNTER — Other Ambulatory Visit: Payer: Self-pay

## 2018-10-20 DIAGNOSIS — R928 Other abnormal and inconclusive findings on diagnostic imaging of breast: Secondary | ICD-10-CM

## 2018-10-20 DIAGNOSIS — R921 Mammographic calcification found on diagnostic imaging of breast: Secondary | ICD-10-CM

## 2018-12-09 ENCOUNTER — Other Ambulatory Visit: Payer: Self-pay | Admitting: Cardiology

## 2019-01-29 ENCOUNTER — Ambulatory Visit: Payer: Medicare Other | Attending: Internal Medicine

## 2019-01-29 DIAGNOSIS — Z23 Encounter for immunization: Secondary | ICD-10-CM | POA: Insufficient documentation

## 2019-02-18 ENCOUNTER — Ambulatory Visit: Payer: Medicare Other | Attending: Internal Medicine

## 2019-02-18 DIAGNOSIS — Z23 Encounter for immunization: Secondary | ICD-10-CM

## 2019-02-18 NOTE — [facility] Notes (Signed)
   Covid-19 Vaccination Clinic  Name:  Carmen Haney    MRN: [id number] DOB: 01-28-1930  02/18/2019  Carmen Haney was observed post Covid-19 immunization for 15 minutes without incidence. She was provided with Vaccine Information Sheet and instruction to access the V-Safe system.   Carmen Haney was instructed to call 911 with any severe reactions post vaccine: Marland Kitchen Difficulty breathing  . Swelling of your face and throat  . A fast heartbeat  . A bad rash all over your body  . Dizziness and weakness    Immunizations Administered    Name Date Dose VIS Date Route   Pfizer COVID-19 Vaccine 02/18/2019 10:32 AM 0.3 mL 12/23/2018 Intramuscular   Manufacturer: [place]   Lot: [id number]   [place]: [id number]

## 2019-03-23 DIAGNOSIS — F330 Major depressive disorder, recurrent, mild: Secondary | ICD-10-CM | POA: Diagnosis not present

## 2019-03-23 DIAGNOSIS — F411 Generalized anxiety disorder: Secondary | ICD-10-CM | POA: Diagnosis not present

## 2019-04-06 DIAGNOSIS — F330 Major depressive disorder, recurrent, mild: Secondary | ICD-10-CM | POA: Diagnosis not present

## 2019-04-06 DIAGNOSIS — F411 Generalized anxiety disorder: Secondary | ICD-10-CM | POA: Diagnosis not present

## 2019-04-19 DIAGNOSIS — F411 Generalized anxiety disorder: Secondary | ICD-10-CM | POA: Diagnosis not present

## 2019-04-19 DIAGNOSIS — F331 Major depressive disorder, recurrent, moderate: Secondary | ICD-10-CM | POA: Diagnosis not present

## 2019-04-20 DIAGNOSIS — F411 Generalized anxiety disorder: Secondary | ICD-10-CM | POA: Diagnosis not present

## 2019-04-20 DIAGNOSIS — F331 Major depressive disorder, recurrent, moderate: Secondary | ICD-10-CM | POA: Diagnosis not present

## 2019-05-04 ENCOUNTER — Other Ambulatory Visit: Payer: Self-pay | Admitting: Adult Health

## 2019-05-04 ENCOUNTER — Ambulatory Visit
Admission: RE | Admit: 2019-05-04 | Discharge: 2019-05-04 | Disposition: A | Discharge status: Home / Self Care | Payer: Medicare PPO | Source: Ambulatory Visit | Attending: Adult Health | Admitting: Adult Health

## 2019-05-04 ENCOUNTER — Other Ambulatory Visit: Payer: Self-pay

## 2019-05-04 DIAGNOSIS — R921 Mammographic calcification found on diagnostic imaging of breast: Secondary | ICD-10-CM | POA: Diagnosis not present

## 2019-05-04 DIAGNOSIS — F411 Generalized anxiety disorder: Secondary | ICD-10-CM | POA: Diagnosis not present

## 2019-05-04 DIAGNOSIS — F331 Major depressive disorder, recurrent, moderate: Secondary | ICD-10-CM | POA: Diagnosis not present

## 2019-05-18 DIAGNOSIS — F411 Generalized anxiety disorder: Secondary | ICD-10-CM | POA: Diagnosis not present

## 2019-05-18 DIAGNOSIS — F331 Major depressive disorder, recurrent, moderate: Secondary | ICD-10-CM | POA: Diagnosis not present

## 2019-06-01 DIAGNOSIS — F331 Major depressive disorder, recurrent, moderate: Secondary | ICD-10-CM | POA: Diagnosis not present

## 2019-06-01 DIAGNOSIS — F411 Generalized anxiety disorder: Secondary | ICD-10-CM | POA: Diagnosis not present

## 2019-06-08 ENCOUNTER — Ambulatory Visit: Payer: Medicare Other | Admitting: Adult Health

## 2019-06-09 ENCOUNTER — Inpatient Hospital Stay: Payer: Medicare PPO | Attending: Adult Health | Admitting: Adult Health

## 2019-06-09 ENCOUNTER — Other Ambulatory Visit: Payer: Self-pay

## 2019-06-09 ENCOUNTER — Encounter: Payer: Self-pay | Admitting: Adult Health

## 2019-06-09 VITALS — BP 109/81 | HR 77 | Temp 98.5°F | Resp 18 | Ht 62.5 in | Wt 202.9 lb

## 2019-06-09 DIAGNOSIS — Z9223 Personal history of estrogen therapy: Secondary | ICD-10-CM | POA: Insufficient documentation

## 2019-06-09 DIAGNOSIS — Z853 Personal history of malignant neoplasm of breast: Secondary | ICD-10-CM | POA: Diagnosis not present

## 2019-06-09 DIAGNOSIS — D0511 Intraductal carcinoma in situ of right breast: Secondary | ICD-10-CM

## 2019-06-09 DIAGNOSIS — Z170 Estrogen receptor positive status [ER+]: Secondary | ICD-10-CM | POA: Diagnosis not present

## 2019-06-09 DIAGNOSIS — Z923 Personal history of irradiation: Secondary | ICD-10-CM | POA: Diagnosis not present

## 2019-06-09 NOTE — [facility] Notes (Signed)
SURVIVORSHIP VISIT:  REASON FOR VISIT:  Routine follow-up for history of breast cancer.   BRIEF ONCOLOGIC HISTORY:  Oncology History  Neoplasm of right breast, primary tumor staging category Tis: ductal carcinoma in situ (DCIS)  03/01/2008 Breast MRI   Right breast: Assymetric and smoothly marginated curvilinear area of enhancement in UIQ measuring 13 x 6 x 9 mm.  No suspicious findings in left breast. No axillary lympadenopathy.    03/19/2008 Breast US   Right breast: No solid or cystic mass, shadowing, or distortion detected. No palpable finding on physical exam to right breast.    03/26/2008 Initial Biopsy   Right breast needle core biopsy: Grade 1, DCIS. ER+ (100%), PR+ (100%).    03/26/2008 Initial Diagnosis   Neoplasm of right breast, primary tumor staging category Tis: ductal carcinoma in situ (DCIS)   04/24/2008 Surgery   Right breast lumpectomy Marlou Starks): Grade 1, DCIS, spanning 0.8 cm. Negative margins.    04/24/2008 Pathologic Stage   pTis, pNx, pMx: Stage 0   06/07/2008 - 07/03/2008 Radiation Therapy   Adjuvant RT completed Valere Dross).  Right breast: Total dose 45 Gy over 18 fractions. Right breast boost: 5 Gy over 2 fractions.    06/2008 - 06/2013 Anti-estrogen oral therapy   Completed 5 years of anti-estrogen therapy with Anastrazole (Magrinat)   07/28/2013 Procedure   Genetic counseling/testing: BreastNext gene panel negative. Genes tested include: ATM, BARD1, BRCA1/2, BRIP1, CDH1, CHEK2, MRE11A, MUTYH, NBN, NF1, PALB2, PTEN, RAD50, RAD51C, RAD51D, and TP53.    07/06/2014 Survivorship   Survivorship Care Plan given and reviewed with patient during in-person clinic visit.       INTERVAL HISTORY:  Carmen Haney presents to the Survivorship Clinic today for routine follow-up for her history of breast cancer.  Overall, she is feeling quite well.  She recently underwent a right breast mammogram to f/u on some calcifications which are stable.  Due to this, she is due for  Repeat  mammogram in October.  She has no new breast concerns today.  She continues to f/u with her PCP and dermatology.  She is active and walks with a walker.  She notes that she has been following pandemic precautions and has also received her covid 19 vaccine.  She reveals that her husband had a heart attack this past year and hasn't had a great year.  Due to this, she is getting together with family and going on a beach trip.  She is very much looking forward to this.    Otherwise Carmen Haney is doing quite well.  She continues to see therapy for the below, and continues on her treatment for anxiety and depression.     REVIEW OF SYSTEMS:  Review of Systems  Constitutional: Negative for appetite change, chills, fatigue, fever and unexpected weight change.  HENT:   Negative for hearing loss, lump/mass, sore throat and trouble swallowing.   Eyes: Negative for eye problems and icterus.  Respiratory: Negative for chest tightness, cough and shortness of breath.   Cardiovascular: Negative for chest pain, leg swelling and palpitations.  Gastrointestinal: Negative for abdominal distention, abdominal pain, blood in stool, constipation, diarrhea, nausea, rectal pain and vomiting.  Endocrine: Negative for hot flashes.  Musculoskeletal: Negative for arthralgias.  Skin: Negative for itching and rash.  Neurological: Negative for dizziness, extremity weakness, headaches and numbness.  Hematological: Negative for adenopathy. Does not bruise/bleed easily.  Psychiatric/Behavioral: Positive for depression (controlled with zoloft). The patient is nervous/anxious (controlled with learned CBT and has PRN lorazepam).  Breast: Denies any new nodularity, masses, tenderness, nipple changes, or nipple discharge.       PAST MEDICAL/SURGICAL HISTORY:  Past Medical History:  Diagnosis Date  . ACE-inhibitor cough   . Breast CA ([place])    s/p lumpectomy & radiation  . Breast cancer ([place]) 2010   Right Breast Cancer  . CAD  (coronary artery disease) 07/1998   s/p cath in 2000. L main with 30 to 40%  . Hearing loss   . HTN (hypertension)   . Hyperlipidemia   . Obesities, morbid ([place])   . Osteoporosis   . Personal history of radiation therapy 2010   Right Breast Cancer  . Thyroid disease   . Vertebral fracture, pathological    Past Surgical History:  Procedure Laterality Date  . BREAST BIOPSY Left   . BREAST LUMPECTOMY Right March 2010  . BREAST LUMPECTOMY WITH RADIOACTIVE SEED LOCALIZATION Left 09/20/2014   Procedure: LEFT BREAST LUMPECTOMY WITH RADIOACTIVE SEED LOCALIZATION;  Surgeon: Autumn Messing III, MD;  Location: [place] [facility];  Service: General;  Laterality: Left;  . CARPAL TUNNEL RELEASE Right   . CESAREAN SECTION    . EYE SURGERY     Right Eye  . Skin Cancer Removed from Right Forearm    . TOTAL ABDOMINAL HYSTERECTOMY       ALLERGIES:  Allergies  Allergen Reactions  . Ace Inhibitors Cough  . Latex Rash     CURRENT MEDICATIONS:  Outpatient Encounter Medications as of 06/09/2019  Medication Sig Note  . aspirin 81 MG tablet Take 81 mg by mouth daily.     . Calcium Carbonate-Vitamin D (CALCIUM + D PO) Take by mouth 2 (two) times daily.     . furosemide (LASIX) 20 MG tablet TAKE ONE TABLET BY MOUTH DAILY   . Levothyroxine Sodium 100 MCG CAPS Take 100 mcg by mouth daily before breakfast.   . losartan (COZAAR) 50 MG tablet TAKE ONE TABLET BY MOUTH DAILY   . sertraline (ZOLOFT) 100 MG tablet Take 200 mg by mouth daily.  11/25/2015: Received from: External Pharmacy  . simvastatin (ZOCOR) 10 MG tablet Take 10 mg by mouth daily at 6 PM.    . [DISCONTINUED] olmesartan (BENICAR) 20 MG tablet Take 20 mg by mouth daily.      No facility-administered encounter medications on file as of 06/09/2019.     ONCOLOGIC FAMILY HISTORY:  Family History  Problem Relation Age of Onset  . Coronary artery disease Mother   . Hypertension Mother   . Melanoma Mother   . Cancer Mother 50       breast    . Breast cancer Mother   . Cancer Father 58       prostate  . Cancer Sister 84       breast  . Breast cancer Sister   . Cancer Maternal Aunt 17       breast  . Cancer Sister 90       breast  . Breast cancer Sister   . Breast cancer Daughter 40    GENETIC COUNSELING/TESTING: Declined   SOCIAL HISTORY:  Social History   Socioeconomic History  . Marital status: Married    Spouse name: Tressie Ellis  . Number of children: Not on file  . Years of education: Not on file  . Highest education level: Not on file  Occupational History  . Occupation: Retired from [employer]    Employer: RETIRED  Tobacco Use  . Smoking status: Former Smoker  Types: Cigarettes    Quit date: 01/13/1963    Years since quitting: 56.4  . Smokeless tobacco: Never Used  . Tobacco comment: stopped in 1970's  Substance and Sexual Activity  . Alcohol use: No    Alcohol/week: 0.0 standard drinks  . Drug use: No  . Sexual activity: Not on file  Other Topics Concern  . Not on file  Social History Narrative  . Not on file   Social Determinants of Health   Financial Resource Strain:   . Difficulty of Paying Living Expenses:   Food Insecurity:   . Worried About Charity fundraiser in the Last Year:   . Arboriculturist in the Last Year:   Transportation Needs:   . Film/video editor (Medical):   Marland Kitchen Lack of Transportation (Non-Medical):   Physical Activity:   . Days of Exercise per Week:   . Minutes of Exercise per Session:   Stress:   . Feeling of Stress :   Social Connections:   . Frequency of Communication with Friends and Family:   . Frequency of Social Gatherings with Friends and Family:   . Attends Religious Services:   . Active Member of Clubs or Organizations:   . Attends Archivist Meetings:   Marland Kitchen Marital Status:   Intimate Partner Violence:   . Fear of Current or Ex-Partner:   . Emotionally Abused:   Marland Kitchen Physically Abused:   . Sexually Abused:       OBJECTIVE:  BP 109/81 (BP  Location: Left Arm, Patient Position: Sitting)   Pulse 77   Temp 98.5 F (36.9 C) (Temporal)   Resp 18   Ht 5' 2.5" (1.588 m)   Wt 202 lb 14.4 oz (92 kg)   SpO2 96%   BMI 36.52 kg/m  Examined in chair GENERAL: Patient is a well appearing female in no acute distress HEENT:  Sclerae anicteric.  Oropharynx clear and moist. No ulcerations or evidence of oropharyngeal candidiasis. Neck is supple.  NODES:  No cervical, supraclavicular, or axillary lymphadenopathy palpated.  BREAST EXAM:  S/p bilateral lumpectomies, no sign of local recurrence.   LUNGS:  Clear to auscultation bilaterally.  No wheezes or rhonchi. HEART:  Regular rate and rhythm. No murmur appreciated. ABDOMEN:  Soft, nontender.  Positive, normoactive bowel sounds. No organomegaly palpated. MSK:  No focal spinal tenderness to palpation. Full range of motion bilaterally in the upper extremities. EXTREMITIES:  No peripheral edema.   SKIN:  Clear with no obvious rashes or skin changes. No nail dyscrasia. NEURO:  Nonfocal. Well oriented.  Appropriate affect.    LABORATORY DATA:  None for this visit   DIAGNOSTIC IMAGING:  Most recent mammogram:   CLINICAL DATA:  86 year old female for six-month follow-up of RIGHT breast calcifications.History of RIGHT breast cancer and lumpectomy in 2010.  EXAM: DIGITAL DIAGNOSTIC UNILATERAL RIGHT MAMMOGRAM WITH CAD AND TOMO  COMPARISON:  Previous exam(s).  ACR Breast Density Category b: There are scattered areas of fibroglandular density.  FINDINGS: 2D/3D full field views and magnification views of the RIGHT breast demonstrate a stable 0.4 cm group of calcifications within the UPPER-OUTER RIGHT breast.  No new calcifications, nonsurgical distortion or suspicious mass identified.  Mammographic images were processed with CAD.  IMPRESSION: Stable likely benign calcifications within the UPPER-OUTER RIGHT breast. Six-month follow-up recommended to ensure 1 year  stability.  RECOMMENDATION: Bilateral diagnostic mammogram with magnification views of the RIGHT breast in 6 months.  I have discussed the findings and recommendations  with the patient. If applicable, a reminder letter will be sent to the patient regarding the next appointment.  BI-RADS CATEGORY  3: Probably benign.   Electronically Signed   By: Margarette Canada M.D.   On: 05/04/2019 16:06      ASSESSMENT AND PLAN:  Carmen Haney is a pleasant 86 y.o. female with history of Stage 0 right breast DCIS, ER+/PR+, diagnosed in 02/2008, treated with lumpectomy, adjuvant radiation therapy, and anti-estrogen therapy with Anastrozole x 5 years completed in 06/2013.  She presents to the Survivorship Clinic for surveillance and routine follow-up.   1. History of breast cancer:  Carmen Haney is currently clinically and radiographically without evidence of disease or recurrence of breast cancer. She is overdue for mammogram; orders placed today.She is also having biennial breast MRI for strong family history of breast cancer.  She will return in one year for LTS follow up.  I encouraged her to call me with any questions or concerns before her next visit at the cancer center, and I would be happy to see her sooner, if needed.    2. Bone health:  Given Carmen Haney's age, history of breast cancer, and her previous anti-estrogen therapy with Anastrozole, she is at risk for bone demineralization. Her last DEXA scan was on 02/12/2017 and was consistent with osteopenia.  She is eating foods rich in calcium.  She was given education on specific food and activities to promote bone health.  3. Cancer screening:  Due to Carmen Haney's history and her age, she should receive screening for skin cancers.  She was encouraged to follow-up with her PCP for appropriate cancer screenings.   4. Health maintenance and wellness promotion: Carmen Haney was encouraged to consume 5-7 servings of fruits and vegetables per day. She was  also encouraged to engage in moderate to vigorous exercise for 30 minutes per day most days of the week. She was instructed to limit her alcohol consumption and continue to abstain from tobacco use.   Follow up instructions:    -Return to cancer center in one year for LTS follow up  -Mammogram due in 10/2019   The patient was provided an opportunity to ask questions and all were answered. The patient agreed with the plan and demonstrated an understanding of the instructions.   Total encounter time: 20 minutes*  Wilber Bihari, NP 06/09/19 1:10 PM Medical Oncology and Hematology [facility] [place], [place] [address] Tel. [phone]    Fax. [phone]  *Total Encounter Time as defined by the Centers for Medicare and Medicaid Services includes, in addition to the face-to-face time of a patient visit (documented in the note above) non-face-to-face time: obtaining and reviewing outside history, ordering and reviewing medications, tests or procedures, care coordination (communications with other health care professionals or caregivers) and documentation in the medical record.   Note: PRIMARY CARE PROVIDER Kelton Pillar, [place] [phone]

## 2019-06-13 ENCOUNTER — Telehealth: Payer: Self-pay | Admitting: Adult Health

## 2019-06-13 NOTE — Telephone Encounter (Signed)
Scheduled appt per 5/28 los. Left voicemail with appt date and time.

## 2019-06-15 DIAGNOSIS — F411 Generalized anxiety disorder: Secondary | ICD-10-CM | POA: Diagnosis not present

## 2019-06-15 DIAGNOSIS — F331 Major depressive disorder, recurrent, moderate: Secondary | ICD-10-CM | POA: Diagnosis not present

## 2019-06-22 ENCOUNTER — Other Ambulatory Visit: Payer: Self-pay | Admitting: Cardiology

## 2019-07-05 DIAGNOSIS — F411 Generalized anxiety disorder: Secondary | ICD-10-CM | POA: Diagnosis not present

## 2019-07-05 DIAGNOSIS — F331 Major depressive disorder, recurrent, moderate: Secondary | ICD-10-CM | POA: Diagnosis not present

## 2019-07-06 DIAGNOSIS — F331 Major depressive disorder, recurrent, moderate: Secondary | ICD-10-CM | POA: Diagnosis not present

## 2019-07-06 DIAGNOSIS — F411 Generalized anxiety disorder: Secondary | ICD-10-CM | POA: Diagnosis not present

## 2019-07-13 DIAGNOSIS — F331 Major depressive disorder, recurrent, moderate: Secondary | ICD-10-CM | POA: Diagnosis not present

## 2019-07-13 DIAGNOSIS — F411 Generalized anxiety disorder: Secondary | ICD-10-CM | POA: Diagnosis not present

## 2019-07-14 DIAGNOSIS — L578 Other skin changes due to chronic exposure to nonionizing radiation: Secondary | ICD-10-CM | POA: Diagnosis not present

## 2019-07-14 DIAGNOSIS — L304 Erythema intertrigo: Secondary | ICD-10-CM | POA: Diagnosis not present

## 2019-07-14 DIAGNOSIS — L820 Inflamed seborrheic keratosis: Secondary | ICD-10-CM | POA: Diagnosis not present

## 2019-07-14 DIAGNOSIS — L821 Other seborrheic keratosis: Secondary | ICD-10-CM | POA: Diagnosis not present

## 2019-07-14 DIAGNOSIS — L570 Actinic keratosis: Secondary | ICD-10-CM | POA: Diagnosis not present

## 2019-07-14 DIAGNOSIS — D485 Neoplasm of uncertain behavior of skin: Secondary | ICD-10-CM | POA: Diagnosis not present

## 2019-07-14 DIAGNOSIS — Z85828 Personal history of other malignant neoplasm of skin: Secondary | ICD-10-CM | POA: Diagnosis not present

## 2019-07-20 DIAGNOSIS — F411 Generalized anxiety disorder: Secondary | ICD-10-CM | POA: Diagnosis not present

## 2019-07-20 DIAGNOSIS — F331 Major depressive disorder, recurrent, moderate: Secondary | ICD-10-CM | POA: Diagnosis not present

## 2019-07-27 DIAGNOSIS — F331 Major depressive disorder, recurrent, moderate: Secondary | ICD-10-CM | POA: Diagnosis not present

## 2019-07-27 DIAGNOSIS — F411 Generalized anxiety disorder: Secondary | ICD-10-CM | POA: Diagnosis not present

## 2019-07-31 DIAGNOSIS — I129 Hypertensive chronic kidney disease with stage 1 through stage 4 chronic kidney disease, or unspecified chronic kidney disease: Secondary | ICD-10-CM | POA: Diagnosis not present

## 2019-07-31 DIAGNOSIS — M25569 Pain in unspecified knee: Secondary | ICD-10-CM | POA: Diagnosis not present

## 2019-07-31 DIAGNOSIS — I2510 Atherosclerotic heart disease of native coronary artery without angina pectoris: Secondary | ICD-10-CM | POA: Diagnosis not present

## 2019-07-31 DIAGNOSIS — E7800 Pure hypercholesterolemia, unspecified: Secondary | ICD-10-CM | POA: Diagnosis not present

## 2019-07-31 DIAGNOSIS — N183 Chronic kidney disease, stage 3 unspecified: Secondary | ICD-10-CM | POA: Diagnosis not present

## 2019-07-31 DIAGNOSIS — E039 Hypothyroidism, unspecified: Secondary | ICD-10-CM | POA: Diagnosis not present

## 2019-08-10 DIAGNOSIS — F331 Major depressive disorder, recurrent, moderate: Secondary | ICD-10-CM | POA: Diagnosis not present

## 2019-08-10 DIAGNOSIS — F411 Generalized anxiety disorder: Secondary | ICD-10-CM | POA: Diagnosis not present

## 2019-08-15 DIAGNOSIS — M1711 Unilateral primary osteoarthritis, right knee: Secondary | ICD-10-CM | POA: Diagnosis not present

## 2019-08-15 DIAGNOSIS — G8929 Other chronic pain: Secondary | ICD-10-CM | POA: Insufficient documentation

## 2019-08-24 DIAGNOSIS — F411 Generalized anxiety disorder: Secondary | ICD-10-CM | POA: Diagnosis not present

## 2019-08-24 DIAGNOSIS — F331 Major depressive disorder, recurrent, moderate: Secondary | ICD-10-CM | POA: Diagnosis not present

## 2019-09-03 ENCOUNTER — Other Ambulatory Visit: Payer: Self-pay | Admitting: Cardiology

## 2019-09-07 DIAGNOSIS — F331 Major depressive disorder, recurrent, moderate: Secondary | ICD-10-CM | POA: Diagnosis not present

## 2019-09-07 DIAGNOSIS — F411 Generalized anxiety disorder: Secondary | ICD-10-CM | POA: Diagnosis not present

## 2019-09-08 ENCOUNTER — Telehealth: Payer: Self-pay

## 2019-09-08 NOTE — Telephone Encounter (Signed)
Called pt who is inquiring about booster for COVID. Per Wilber Bihari, NP, pt does not need booster until 8 mos after 2nd vaccine which was in Feb 2021. Pt understands she is eligible for booster in October and will plan accordingly. Pt understands to call back with any questions/concerns.

## 2019-09-21 DIAGNOSIS — F411 Generalized anxiety disorder: Secondary | ICD-10-CM | POA: Diagnosis not present

## 2019-09-21 DIAGNOSIS — F331 Major depressive disorder, recurrent, moderate: Secondary | ICD-10-CM | POA: Diagnosis not present

## 2019-10-02 DIAGNOSIS — F411 Generalized anxiety disorder: Secondary | ICD-10-CM | POA: Diagnosis not present

## 2019-10-02 DIAGNOSIS — F331 Major depressive disorder, recurrent, moderate: Secondary | ICD-10-CM | POA: Diagnosis not present

## 2019-10-05 DIAGNOSIS — F331 Major depressive disorder, recurrent, moderate: Secondary | ICD-10-CM | POA: Diagnosis not present

## 2019-10-05 DIAGNOSIS — F411 Generalized anxiety disorder: Secondary | ICD-10-CM | POA: Diagnosis not present

## 2019-10-19 DIAGNOSIS — F331 Major depressive disorder, recurrent, moderate: Secondary | ICD-10-CM | POA: Diagnosis not present

## 2019-10-19 DIAGNOSIS — F411 Generalized anxiety disorder: Secondary | ICD-10-CM | POA: Diagnosis not present

## 2019-10-21 DIAGNOSIS — Z23 Encounter for immunization: Secondary | ICD-10-CM | POA: Diagnosis not present

## 2019-10-28 ENCOUNTER — Ambulatory Visit: Payer: Medicare PPO | Attending: Internal Medicine

## 2019-10-28 ENCOUNTER — Other Ambulatory Visit: Payer: Self-pay

## 2019-10-28 DIAGNOSIS — Z23 Encounter for immunization: Secondary | ICD-10-CM

## 2019-10-28 NOTE — [facility] Notes (Signed)
   Covid-19 Vaccination Clinic  Name:  Carmen Haney    MRN: [id number] DOB: 11-23-1930  10/28/2019  [name] was observed post Covid-19 immunization for 15 minutes without incident. She was provided with Vaccine Information Sheet and instruction to access the V-Safe system.   [name] was instructed to call 911 with any severe reactions post vaccine: Marland Kitchen Difficulty breathing  . Swelling of face and throat  . A fast heartbeat  . A bad rash all over body  . Dizziness and weakness

## 2019-11-02 DIAGNOSIS — M1712 Unilateral primary osteoarthritis, left knee: Secondary | ICD-10-CM | POA: Diagnosis not present

## 2019-11-03 ENCOUNTER — Telehealth: Payer: Self-pay | Admitting: Cardiology

## 2019-11-03 NOTE — Telephone Encounter (Signed)
I first called the preferred # [phone], pt's husband answered and asked if I could call the house # [phone] as the pt was at home. I called the home # and left message for the pt that we were calling to schedule a sooner appt for pre op clearance. Left message to please call the office to schedule.

## 2019-11-03 NOTE — Telephone Encounter (Signed)
   Primary Cardiologist: Kirk Ruths, MD  Chart reviewed as part of pre-operative protocol coverage. Patient has not been seen in our office since 10/06/2018. Therefore,  she will require a follow-up visit in order to better assess preoperative cardiovascular risk. She has an appointment with Dr. Stanford Breed scheduled for 12/12/2019. If surgery is not going to be done before this date, then pre-op evaluation can be assessed at that time. If she is going to have surgery before 12/12/2019, we will need to schedule pre-op appointment.  Pre-op covering staff: - Please schedule appointment and call patient to inform them. If patient already had an upcoming appointment within acceptable timeframe, please add "pre-op clearance" to the appointment notes so provider is aware. - Please contact requesting surgeon's office via preferred method (i.e, phone, fax) to inform them of need for appointment prior to surgery.    Darreld Mclean, PA-C  11/03/2019, 4:31 PM

## 2019-11-03 NOTE — Telephone Encounter (Signed)
   [facility] Pre-operative Risk Assessment   Request for surgical clearance:  1. What type of surgery is being performed? Right total knee replacement (TKR)  2. When is this surgery scheduled? TBD   3. What type of clearance is required (medical clearance vs. Pharmacy clearance to hold med vs. Both)? Both   4. Are there any medications that need to be held prior to surgery and how long?  ASA  5. Practice name and name of physician performing surgery? Dr. Lara Mulch with [facility]    6. What is the office phone number? [phone]   7.   What is the office fax number? [phone]  8.   Anesthesia type (None, local, MAC, general) ? Not specified    Fidel Levy 11/03/2019, 3:47 PM  _________________________________________________________________   (provider comments below)

## 2019-11-06 NOTE — [facility] Notes (Signed)
HPI: FU coronary disease. Patient had a cardiac catheterization in 2002 that showed a 40% left main. In August 2015 pt had myoview that showed EF 64 with no ischemia. Carotid dopplers6/17 normal. Patient seen for syncope June2017. Monitor showed sinus with rare PAC and PVC. Since she was last seen  she has limited mobility due to knee pain.  She is scheduled to have bilateral knee replacement over the next 6 to 8 months.  She has some dyspnea on exertion.  No orthopnea, PND, pedal edema.  Occasional chest heaviness not related to activities.  Current Outpatient Medications  Medication Sig Dispense Refill   aspirin 81 MG tablet Take 81 mg by mouth daily.       Calcium Carbonate-Vitamin D (CALCIUM + D PO) Take by mouth 2 (two) times daily.       furosemide (LASIX) 20 MG tablet TAKE ONE TABLET BY MOUTH DAILY 90 tablet 1   Levothyroxine Sodium 100 MCG CAPS Take 100 mcg by mouth daily before breakfast.     LORazepam (ATIVAN) 0.5 MG tablet Take 0.25 mg by mouth every 8 (eight) hours.     losartan (COZAAR) 50 MG tablet TAKE ONE TABLET BY MOUTH DAILY 90 tablet 1   sertraline (ZOLOFT) 100 MG tablet Take 200 mg by mouth daily.      simvastatin (ZOCOR) 10 MG tablet Take 10 mg by mouth daily at 6 PM.      No current facility-administered medications for this visit.     Past Medical History:  Diagnosis Date   ACE-inhibitor cough    Breast CA [facility])    s/p lumpectomy & radiation   Breast cancer ([place]) 2010   Right Breast Cancer   CAD (coronary artery disease) 07/1998   s/p cath in 2000. L main with 30 to 40%   Hearing loss    HTN (hypertension)    Hyperlipidemia    Obesities, morbid ([place])    Osteoporosis    Personal history of radiation therapy 2010   Right Breast Cancer   Thyroid disease    Vertebral fracture, pathological     Past Surgical History:  Procedure Laterality Date   BREAST BIOPSY Left    BREAST LUMPECTOMY Right March 2010   BREAST LUMPECTOMY  WITH RADIOACTIVE SEED LOCALIZATION Left 09/20/2014   Procedure: LEFT BREAST LUMPECTOMY WITH RADIOACTIVE SEED LOCALIZATION;  Surgeon: Autumn Messing III, MD;  Location: [place];  Service: General;  Laterality: Left;   CARPAL TUNNEL RELEASE Right    CESAREAN SECTION     EYE SURGERY     Right Eye   Skin Cancer Removed from Right Forearm     TOTAL ABDOMINAL HYSTERECTOMY      Social History   Socioeconomic History   Marital status: Married    Spouse name: Tressie Ellis   Number of children: Not on file   Years of education: Not on file   Highest education level: Not on file  Occupational History   Occupation: Retired from [employer]    Employer: RETIRED  Tobacco Use   Smoking status: Former Smoker    Types: Cigarettes    Quit date: 01/13/1963    Years since quitting: 56.8   Smokeless tobacco: Never Used   Tobacco comment: stopped in 1970's  Vaping Use   Vaping Use: Never used  Substance and Sexual Activity   Alcohol use: No    Alcohol/week: 0.0 standard drinks   Drug use: No   Sexual activity: Not on file  Other Topics Concern   Not on file  Social History Narrative   Not on file   Social Determinants of Health   Financial Resource Strain:    Difficulty of Paying Living Expenses: Not on file  Food Insecurity:    Worried About [place] in the Last Year: Not on file   Ran Out of Food in the Last Year: Not on file  Transportation Needs:    Lack of Transportation (Medical): Not on file   Lack of Transportation (Non-Medical): Not on file  Physical Activity:    Days of Exercise per Week: Not on file   Minutes of Exercise per Session: Not on file  Stress:    Feeling of Stress : Not on file  Social Connections:    Frequency of Communication with Friends and Family: Not on file   Frequency of Social Gatherings with Friends and Family: Not on file   Attends Religious Services: Not on file   Active Member of Clubs or Organizations: Not  on file   Attends Archivist Meetings: Not on file   Marital Status: Not on file  Intimate Partner Violence:    Fear of Current or Ex-Partner: Not on file   Emotionally Abused: Not on file   Physically Abused: Not on file   Sexually Abused: Not on file    Family History  Problem Relation Age of Onset   Coronary artery disease Mother    Hypertension Mother    Melanoma Mother    Cancer Mother 74       breast   Breast cancer Mother    Cancer Father 42       prostate   Cancer Sister 62       breast   Breast cancer Sister    Cancer Maternal Aunt 41       breast   Cancer Sister 4       breast   Breast cancer Sister    Breast cancer Daughter 46    ROS: no fevers or chills, productive cough, hemoptysis, dysphasia, odynophagia, melena, hematochezia, dysuria, hematuria, rash, seizure activity, orthopnea, PND, pedal edema, claudication. Remaining systems are negative.  Physical Exam: Well-developed well-nourished in no acute distress.  Skin is warm and dry.  HEENT is normal.  Neck is supple.  Chest is clear to auscultation with normal expansion.  Cardiovascular exam is regular rate and rhythm.  Abdominal exam nontender or distended. No masses palpated. Extremities show no edema. neuro grossly intact  ECG-sinus rhythm with occasional PAC.  Cannot rule out anterior infarct.  Left anterior fascicular block.  Personally reviewed  A/P  1 preoperative evaluation-patient is scheduled for bilateral knee replacement.  Functional capacity is difficult to assess due to limited mobility from knee pain.  We will arrange a [place] nuclear study for risk stratification.  If normal or low risk she may proceed.  2 coronary artery disease-no chest pain.  Plan to continue aspirin and statin.  3 hyperlipidemia-continue statin.  4 hypertension-patient's blood pressure is controlled today.  Continue present medications and follow.  Kirk Ruths, MD

## 2019-11-06 NOTE — Telephone Encounter (Signed)
I left message x 2 to call the office to schedule a pre op appt.

## 2019-11-06 NOTE — Telephone Encounter (Signed)
Per scheduler the pt called back and has been scheduled to see Dr. Stanford Breed 11/07/19 for pre op clearance. Will forward notes to MD for upcoming appt. Will remove from the pre op call back pool.

## 2019-11-07 ENCOUNTER — Encounter: Payer: Self-pay | Admitting: *Deleted

## 2019-11-07 ENCOUNTER — Other Ambulatory Visit: Payer: Self-pay

## 2019-11-07 ENCOUNTER — Ambulatory Visit: Payer: Medicare PPO | Admitting: Cardiology

## 2019-11-07 ENCOUNTER — Encounter: Payer: Self-pay | Admitting: Cardiology

## 2019-11-07 VITALS — BP 106/50 | HR 77 | Ht 62.5 in | Wt 194.8 lb

## 2019-11-07 DIAGNOSIS — Z01818 Encounter for other preprocedural examination: Secondary | ICD-10-CM | POA: Diagnosis not present

## 2019-11-07 DIAGNOSIS — I10 Essential (primary) hypertension: Secondary | ICD-10-CM | POA: Diagnosis not present

## 2019-11-07 DIAGNOSIS — I2510 Atherosclerotic heart disease of native coronary artery without angina pectoris: Secondary | ICD-10-CM

## 2019-11-07 DIAGNOSIS — E7800 Pure hypercholesterolemia, unspecified: Secondary | ICD-10-CM | POA: Diagnosis not present

## 2019-11-07 NOTE — Patient Instructions (Signed)
  Testing/Procedures:  Your physician has requested that you have a lexiscan myoview. For further information please visit [email or web address]. Please follow instruction sheet, as given. [address]   Follow-Up: At [facility], you and your health needs are our priority.  As part of our continuing mission to provide you with exceptional heart care, we have created designated Provider Care Teams.  These Care Teams include your primary Cardiologist (physician) and Advanced Practice Providers (APPs -  Physician Assistants and Nurse Practitioners) who all work together to provide you with the care you need, when you need it.  We recommend signing up for the patient portal called "MyChart".  Sign up information is provided on this After Visit Summary.  MyChart is used to connect with patients for Virtual Visits (Telemedicine).  Patients are able to view lab/test results, encounter notes, upcoming appointments, etc.  Non-urgent messages can be sent to your provider as well.   To learn more about what you can do with MyChart, go to [email or web address].    Your next appointment:   12 month(s)  The format for your next appointment:   In Person  Provider:   Brian Crenshaw, MD    

## 2019-11-16 DIAGNOSIS — F411 Generalized anxiety disorder: Secondary | ICD-10-CM | POA: Diagnosis not present

## 2019-11-16 DIAGNOSIS — F331 Major depressive disorder, recurrent, moderate: Secondary | ICD-10-CM | POA: Diagnosis not present

## 2019-11-21 ENCOUNTER — Telehealth (HOSPITAL_COMMUNITY): Payer: Self-pay | Admitting: *Deleted

## 2019-11-21 NOTE — Telephone Encounter (Signed)
Attempted to call patient regarding upcoming appointment- no answer.  Carmen Haney  

## 2019-11-21 NOTE — Telephone Encounter (Signed)
Patient given detailed instructions per Myocardial Perfusion Study Information Sheet for the test on 11/27/19. Patient notified to arrive 15 minutes early and that it is imperative to arrive on time for appointment to keep from having the test rescheduled.  If you need to cancel or reschedule your appointment, please call the office within 24 hours of your appointment. . Patient verbalized understanding. Carmen Haney

## 2019-11-27 ENCOUNTER — Other Ambulatory Visit: Payer: Self-pay

## 2019-11-27 ENCOUNTER — Ambulatory Visit (HOSPITAL_COMMUNITY): Payer: Medicare PPO | Attending: Cardiology

## 2019-11-27 VITALS — Ht 62.5 in | Wt 194.0 lb

## 2019-11-27 DIAGNOSIS — I2510 Atherosclerotic heart disease of native coronary artery without angina pectoris: Secondary | ICD-10-CM | POA: Insufficient documentation

## 2019-11-27 DIAGNOSIS — Z01810 Encounter for preprocedural cardiovascular examination: Secondary | ICD-10-CM

## 2019-11-27 DIAGNOSIS — Z01818 Encounter for other preprocedural examination: Secondary | ICD-10-CM | POA: Diagnosis not present

## 2019-11-27 LAB — MYOCARDIAL PERFUSION IMAGING
LV dias vol: 47 mL (ref 46–106)
LV sys vol: 15 mL
Peak HR: 90 {beats}/min
Rest HR: 67 {beats}/min
SDS: 0
SRS: 0
SSS: 0
TID: 1.03

## 2019-11-27 MED ORDER — REGADENOSON 0.4 MG/5ML IV SOLN
0.4000 mg | Freq: Once | INTRAVENOUS | Status: AC
  Administered 2019-11-27: 0.4 mg via INTRAVENOUS

## 2019-11-27 MED ORDER — TECHNETIUM TC 99M TETROFOSMIN IV KIT
10.8000 | PACK | Freq: Once | INTRAVENOUS | Status: AC | PRN
  Administered 2019-11-27: 10.8 via INTRAVENOUS
  Filled 2019-11-27: qty 11

## 2019-11-27 MED ORDER — TECHNETIUM TC 99M TETROFOSMIN IV KIT
32.7000 | PACK | Freq: Once | INTRAVENOUS | Status: AC | PRN
  Administered 2019-11-27: 32.7 via INTRAVENOUS
  Filled 2019-11-27: qty 33

## 2019-11-30 DIAGNOSIS — F331 Major depressive disorder, recurrent, moderate: Secondary | ICD-10-CM | POA: Diagnosis not present

## 2019-11-30 DIAGNOSIS — F411 Generalized anxiety disorder: Secondary | ICD-10-CM | POA: Diagnosis not present

## 2019-12-12 ENCOUNTER — Ambulatory Visit: Payer: Medicare PPO | Admitting: Cardiology

## 2019-12-13 ENCOUNTER — Other Ambulatory Visit: Payer: Self-pay

## 2019-12-13 ENCOUNTER — Ambulatory Visit
Admission: RE | Admit: 2019-12-13 | Discharge: 2019-12-13 | Disposition: A | Discharge status: Home / Self Care | Payer: Medicare PPO | Source: Ambulatory Visit | Attending: Adult Health | Admitting: Adult Health

## 2019-12-13 DIAGNOSIS — R921 Mammographic calcification found on diagnostic imaging of breast: Secondary | ICD-10-CM

## 2019-12-14 DIAGNOSIS — F331 Major depressive disorder, recurrent, moderate: Secondary | ICD-10-CM | POA: Diagnosis not present

## 2019-12-14 DIAGNOSIS — F411 Generalized anxiety disorder: Secondary | ICD-10-CM | POA: Diagnosis not present

## 2019-12-16 ENCOUNTER — Other Ambulatory Visit: Payer: Self-pay | Admitting: Cardiology

## 2019-12-18 DIAGNOSIS — F411 Generalized anxiety disorder: Secondary | ICD-10-CM | POA: Diagnosis not present

## 2019-12-18 DIAGNOSIS — F331 Major depressive disorder, recurrent, moderate: Secondary | ICD-10-CM | POA: Diagnosis not present

## 2019-12-28 DIAGNOSIS — F411 Generalized anxiety disorder: Secondary | ICD-10-CM | POA: Diagnosis not present

## 2019-12-28 DIAGNOSIS — F331 Major depressive disorder, recurrent, moderate: Secondary | ICD-10-CM | POA: Diagnosis not present

## 2020-01-09 DIAGNOSIS — F331 Major depressive disorder, recurrent, moderate: Secondary | ICD-10-CM | POA: Diagnosis not present

## 2020-01-09 DIAGNOSIS — F411 Generalized anxiety disorder: Secondary | ICD-10-CM | POA: Diagnosis not present

## 2020-01-25 DIAGNOSIS — F331 Major depressive disorder, recurrent, moderate: Secondary | ICD-10-CM | POA: Diagnosis not present

## 2020-01-25 DIAGNOSIS — F411 Generalized anxiety disorder: Secondary | ICD-10-CM | POA: Diagnosis not present

## 2020-02-29 DIAGNOSIS — F331 Major depressive disorder, recurrent, moderate: Secondary | ICD-10-CM | POA: Diagnosis not present

## 2020-02-29 DIAGNOSIS — F411 Generalized anxiety disorder: Secondary | ICD-10-CM | POA: Diagnosis not present

## 2020-03-14 DIAGNOSIS — F331 Major depressive disorder, recurrent, moderate: Secondary | ICD-10-CM | POA: Diagnosis not present

## 2020-03-14 DIAGNOSIS — F411 Generalized anxiety disorder: Secondary | ICD-10-CM | POA: Diagnosis not present

## 2020-03-21 DIAGNOSIS — F331 Major depressive disorder, recurrent, moderate: Secondary | ICD-10-CM | POA: Diagnosis not present

## 2020-03-21 DIAGNOSIS — F411 Generalized anxiety disorder: Secondary | ICD-10-CM | POA: Diagnosis not present

## 2020-03-23 ENCOUNTER — Other Ambulatory Visit: Payer: Self-pay | Admitting: Cardiology

## 2020-04-18 DIAGNOSIS — F331 Major depressive disorder, recurrent, moderate: Secondary | ICD-10-CM | POA: Diagnosis not present

## 2020-04-18 DIAGNOSIS — F411 Generalized anxiety disorder: Secondary | ICD-10-CM | POA: Diagnosis not present

## 2020-05-02 DIAGNOSIS — F411 Generalized anxiety disorder: Secondary | ICD-10-CM | POA: Diagnosis not present

## 2020-05-02 DIAGNOSIS — F331 Major depressive disorder, recurrent, moderate: Secondary | ICD-10-CM | POA: Diagnosis not present

## 2020-05-15 DIAGNOSIS — Z634 Disappearance and death of family member: Secondary | ICD-10-CM | POA: Diagnosis not present

## 2020-05-15 DIAGNOSIS — F411 Generalized anxiety disorder: Secondary | ICD-10-CM | POA: Diagnosis not present

## 2020-05-15 DIAGNOSIS — F331 Major depressive disorder, recurrent, moderate: Secondary | ICD-10-CM | POA: Diagnosis not present

## 2020-05-16 DIAGNOSIS — F331 Major depressive disorder, recurrent, moderate: Secondary | ICD-10-CM | POA: Diagnosis not present

## 2020-05-16 DIAGNOSIS — F411 Generalized anxiety disorder: Secondary | ICD-10-CM | POA: Diagnosis not present

## 2020-05-17 DIAGNOSIS — F322 Major depressive disorder, single episode, severe without psychotic features: Secondary | ICD-10-CM | POA: Diagnosis not present

## 2020-05-17 DIAGNOSIS — I129 Hypertensive chronic kidney disease with stage 1 through stage 4 chronic kidney disease, or unspecified chronic kidney disease: Secondary | ICD-10-CM | POA: Diagnosis not present

## 2020-05-17 DIAGNOSIS — E7800 Pure hypercholesterolemia, unspecified: Secondary | ICD-10-CM | POA: Diagnosis not present

## 2020-05-17 DIAGNOSIS — E039 Hypothyroidism, unspecified: Secondary | ICD-10-CM | POA: Diagnosis not present

## 2020-05-17 DIAGNOSIS — N183 Chronic kidney disease, stage 3 unspecified: Secondary | ICD-10-CM | POA: Diagnosis not present

## 2020-05-17 DIAGNOSIS — F4321 Adjustment disorder with depressed mood: Secondary | ICD-10-CM | POA: Diagnosis not present

## 2020-06-12 ENCOUNTER — Encounter: Payer: Medicare PPO | Admitting: Adult Health

## 2020-06-21 ENCOUNTER — Encounter: Payer: Medicare PPO | Admitting: Adult Health

## 2020-07-02 DIAGNOSIS — F322 Major depressive disorder, single episode, severe without psychotic features: Secondary | ICD-10-CM | POA: Diagnosis not present

## 2020-07-02 DIAGNOSIS — Z0000 Encounter for general adult medical examination without abnormal findings: Secondary | ICD-10-CM | POA: Diagnosis not present

## 2020-07-02 DIAGNOSIS — I2510 Atherosclerotic heart disease of native coronary artery without angina pectoris: Secondary | ICD-10-CM | POA: Diagnosis not present

## 2020-07-02 DIAGNOSIS — N183 Chronic kidney disease, stage 3 unspecified: Secondary | ICD-10-CM | POA: Diagnosis not present

## 2020-07-02 DIAGNOSIS — E039 Hypothyroidism, unspecified: Secondary | ICD-10-CM | POA: Diagnosis not present

## 2020-07-02 DIAGNOSIS — I519 Heart disease, unspecified: Secondary | ICD-10-CM | POA: Diagnosis not present

## 2020-07-02 DIAGNOSIS — E7800 Pure hypercholesterolemia, unspecified: Secondary | ICD-10-CM | POA: Diagnosis not present

## 2020-07-02 DIAGNOSIS — Z1389 Encounter for screening for other disorder: Secondary | ICD-10-CM | POA: Diagnosis not present

## 2020-07-02 DIAGNOSIS — I129 Hypertensive chronic kidney disease with stage 1 through stage 4 chronic kidney disease, or unspecified chronic kidney disease: Secondary | ICD-10-CM | POA: Diagnosis not present

## 2020-08-21 DIAGNOSIS — Z860100 Personal history of colon polyps, unspecified: Secondary | ICD-10-CM | POA: Insufficient documentation

## 2020-08-21 DIAGNOSIS — R2689 Other abnormalities of gait and mobility: Secondary | ICD-10-CM | POA: Insufficient documentation

## 2020-08-21 DIAGNOSIS — M810 Age-related osteoporosis without current pathological fracture: Secondary | ICD-10-CM | POA: Insufficient documentation

## 2020-08-21 DIAGNOSIS — F329 Major depressive disorder, single episode, unspecified: Secondary | ICD-10-CM | POA: Insufficient documentation

## 2020-08-21 DIAGNOSIS — F32A Depression, unspecified: Secondary | ICD-10-CM | POA: Insufficient documentation

## 2020-08-21 DIAGNOSIS — K599 Functional intestinal disorder, unspecified: Secondary | ICD-10-CM | POA: Insufficient documentation

## 2020-08-21 DIAGNOSIS — N183 Chronic kidney disease, stage 3 unspecified: Secondary | ICD-10-CM | POA: Insufficient documentation

## 2020-08-21 DIAGNOSIS — R0989 Other specified symptoms and signs involving the circulatory and respiratory systems: Secondary | ICD-10-CM | POA: Insufficient documentation

## 2020-08-21 NOTE — Progress Notes (Signed)
SURVIVORSHIP VISIT:  REASON FOR VISIT:  Routine follow-up for history of breast cancer.   BRIEF ONCOLOGIC HISTORY:  Oncology History  Neoplasm of right breast, primary tumor staging category Tis: ductal carcinoma in situ (DCIS)  03/01/2008 Breast MRI   Right breast: Assymetric and smoothly marginated curvilinear area of enhancement in UIQ measuring 13 x 6 x 9 mm.  No suspicious findings in left breast. No axillary lympadenopathy.    03/19/2008 Breast US   Right breast: No solid or cystic mass, shadowing, or distortion detected. No palpable finding on physical exam to right breast.    03/26/2008 Initial Biopsy   Right breast needle core biopsy: Grade 1, DCIS. ER+ (100%), PR+ (100%).    03/26/2008 Initial Diagnosis   Neoplasm of right breast, primary tumor staging category Tis: ductal carcinoma in situ (DCIS)   04/24/2008 Surgery   Right breast lumpectomy Marlou Starks): Grade 1, DCIS, spanning 0.8 cm. Negative margins.    04/24/2008 Pathologic Stage   pTis, pNx, pMx: Stage 0   06/07/2008 - 07/03/2008 Radiation Therapy   Adjuvant RT completed Valere Dross).  Right breast: Total dose 45 Gy over 18 fractions. Right breast boost: 5 Gy over 2 fractions.    06/2008 - 06/2013 Anti-estrogen oral therapy   Completed 5 years of anti-estrogen therapy with Anastrazole (Magrinat)   07/28/2013 Procedure   Genetic counseling/testing: BreastNext gene panel negative. Genes tested include: ATM, BARD1, BRCA1/2, BRIP1, CDH1, CHEK2, MRE11A, MUTYH, NBN, NF1, PALB2, PTEN, RAD50, RAD51C, RAD51D, and TP53.    07/06/2014 Survivorship   Survivorship Care Plan given and reviewed with patient during in-person clinic visit.       INTERVAL HISTORY:  Carmen Haney Did not show to her appointment today.  A no show letter has been sent.      REVIEW OF SYSTEMS:     PAST MEDICAL/SURGICAL HISTORY:  Past Medical History:  Diagnosis Date   ACE-inhibitor cough    Breast CA [facility])    s/p lumpectomy & radiation   Breast cancer ([place])  2010   Right Breast Cancer   CAD (coronary artery disease) 07/1998   s/p cath in 2000. L main with 30 to 40%   Hearing loss    HTN (hypertension)    Hyperlipidemia    Obesities, morbid ([place])    Osteoporosis    Personal history of radiation therapy 2010   Right Breast Cancer   Thyroid disease    Vertebral fracture, pathological    Past Surgical History:  Procedure Laterality Date   BREAST BIOPSY Left    BREAST LUMPECTOMY Right March 2010   BREAST LUMPECTOMY WITH RADIOACTIVE SEED LOCALIZATION Left 09/20/2014   Procedure: LEFT BREAST LUMPECTOMY WITH RADIOACTIVE SEED LOCALIZATION;  Surgeon: Autumn Messing III, MD;  Location: [place];  Service: General;  Laterality: Left;   CARPAL TUNNEL RELEASE Right    CESAREAN SECTION     EYE SURGERY     Right Eye   Skin Cancer Removed from Right Forearm     TOTAL ABDOMINAL HYSTERECTOMY       ALLERGIES:  Allergies  Allergen Reactions   Ace Inhibitors Cough   Latex Rash     CURRENT MEDICATIONS:  Outpatient Encounter Medications as of 08/22/2020  Medication Sig Note   aspirin 81 MG tablet Take 81 mg by mouth daily.      Calcium Carbonate-Vitamin D (CALCIUM + D PO) Take by mouth 2 (two) times daily.      furosemide (LASIX) 20 MG tablet TAKE ONE TABLET BY MOUTH  DAILY    Levothyroxine Sodium 100 MCG CAPS Take 100 mcg by mouth daily before breakfast.    LORazepam (ATIVAN) 0.5 MG tablet Take 0.25 mg by mouth every 8 (eight) hours.    losartan (COZAAR) 50 MG tablet TAKE ONE TABLET BY MOUTH DAILY    sertraline (ZOLOFT) 100 MG tablet Take 200 mg by mouth daily.  11/25/2015: Received from: External Pharmacy   simvastatin (ZOCOR) 10 MG tablet Take 10 mg by mouth daily at 6 PM.     [DISCONTINUED] olmesartan (BENICAR) 20 MG tablet Take 20 mg by mouth daily.      No facility-administered encounter medications on file as of 08/22/2020.     ONCOLOGIC FAMILY HISTORY:  Family History  Problem Relation Age of Onset   Coronary artery disease  Mother    Hypertension Mother    Melanoma Mother    Cancer Mother 30       breast   Breast cancer Mother    Cancer Father 19       prostate   Cancer Sister 59       breast   Breast cancer Sister    Cancer Maternal Aunt 60       breast   Cancer Sister 31       breast   Breast cancer Sister    Breast cancer Daughter 46    GENETIC COUNSELING/TESTING: Declined   SOCIAL HISTORY:  Social History   Socioeconomic History   Marital status: Married    Spouse name: Tressie Ellis   Number of children: Not on file   Years of education: Not on file   Highest education level: Not on file  Occupational History   Occupation: Retired from [employer] RETIRED  Tobacco Use   Smoking status: Former    Types: Cigarettes    Quit date: 01/13/1963    Years since quitting: 57.6   Smokeless tobacco: Never   Tobacco comments:    stopped in 1970's  Vaping Use   Vaping Use: Never used  Substance and Sexual Activity   Alcohol use: No    Alcohol/week: 0.0 standard drinks   Drug use: No   Sexual activity: Not on file  Other Topics Concern   Not on file  Social History Narrative   Not on file   Social Determinants of Health   Financial Resource Strain: Not on file  Food Insecurity: Not on file  Transportation Needs: Not on file  Physical Activity: Not on file  Stress: Not on file  Social Connections: Not on file  Intimate Partner Violence: Not on file      OBJECTIVE:  There were no vitals taken for this visit.     LABORATORY DATA:  None for this visit   DIAGNOSTIC IMAGING:  Most recent mammogram:       ASSESSMENT AND PLAN:  Carmen Haney didn't show for her appointment today.  A no show letter has been mailed.    Wilber Bihari, NP

## 2020-08-22 ENCOUNTER — Inpatient Hospital Stay: Payer: Medicare PPO | Attending: Adult Health | Admitting: Adult Health

## 2020-08-22 DIAGNOSIS — D0511 Intraductal carcinoma in situ of right breast: Secondary | ICD-10-CM

## 2020-08-30 ENCOUNTER — Ambulatory Visit
Admission: RE | Admit: 2020-08-30 | Discharge: 2020-08-30 | Disposition: A | Discharge status: Home / Self Care | Payer: Medicare PPO | Source: Ambulatory Visit | Attending: Family Medicine | Admitting: Family Medicine

## 2020-08-30 ENCOUNTER — Other Ambulatory Visit: Payer: Self-pay | Admitting: Family Medicine

## 2020-08-30 DIAGNOSIS — I517 Cardiomegaly: Secondary | ICD-10-CM | POA: Diagnosis not present

## 2020-08-30 DIAGNOSIS — F322 Major depressive disorder, single episode, severe without psychotic features: Secondary | ICD-10-CM | POA: Diagnosis not present

## 2020-08-30 DIAGNOSIS — W19XXXA Unspecified fall, initial encounter: Secondary | ICD-10-CM | POA: Diagnosis not present

## 2020-08-30 DIAGNOSIS — M549 Dorsalgia, unspecified: Secondary | ICD-10-CM | POA: Diagnosis not present

## 2020-08-30 DIAGNOSIS — G479 Sleep disorder, unspecified: Secondary | ICD-10-CM | POA: Diagnosis not present

## 2020-08-30 DIAGNOSIS — I519 Heart disease, unspecified: Secondary | ICD-10-CM | POA: Diagnosis not present

## 2020-08-30 DIAGNOSIS — M545 Low back pain, unspecified: Secondary | ICD-10-CM | POA: Diagnosis not present

## 2020-08-30 DIAGNOSIS — R0781 Pleurodynia: Secondary | ICD-10-CM | POA: Diagnosis not present

## 2020-08-30 DIAGNOSIS — E039 Hypothyroidism, unspecified: Secondary | ICD-10-CM | POA: Diagnosis not present

## 2020-09-01 DIAGNOSIS — M545 Low back pain, unspecified: Secondary | ICD-10-CM | POA: Diagnosis not present

## 2020-09-01 DIAGNOSIS — H903 Sensorineural hearing loss, bilateral: Secondary | ICD-10-CM | POA: Diagnosis not present

## 2020-09-01 DIAGNOSIS — N183 Chronic kidney disease, stage 3 unspecified: Secondary | ICD-10-CM | POA: Diagnosis not present

## 2020-09-01 DIAGNOSIS — G479 Sleep disorder, unspecified: Secondary | ICD-10-CM | POA: Diagnosis not present

## 2020-09-01 DIAGNOSIS — F419 Anxiety disorder, unspecified: Secondary | ICD-10-CM | POA: Diagnosis not present

## 2020-09-01 DIAGNOSIS — G8929 Other chronic pain: Secondary | ICD-10-CM | POA: Diagnosis not present

## 2020-09-01 DIAGNOSIS — F329 Major depressive disorder, single episode, unspecified: Secondary | ICD-10-CM | POA: Diagnosis not present

## 2020-09-01 DIAGNOSIS — I1310 Hypertensive heart and chronic kidney disease without heart failure, with stage 1 through stage 4 chronic kidney disease, or unspecified chronic kidney disease: Secondary | ICD-10-CM | POA: Diagnosis not present

## 2020-09-01 DIAGNOSIS — I2510 Atherosclerotic heart disease of native coronary artery without angina pectoris: Secondary | ICD-10-CM | POA: Diagnosis not present

## 2020-09-01 DIAGNOSIS — M810 Age-related osteoporosis without current pathological fracture: Secondary | ICD-10-CM | POA: Diagnosis not present

## 2020-09-02 DIAGNOSIS — H903 Sensorineural hearing loss, bilateral: Secondary | ICD-10-CM | POA: Diagnosis not present

## 2020-09-02 DIAGNOSIS — N183 Chronic kidney disease, stage 3 unspecified: Secondary | ICD-10-CM | POA: Diagnosis not present

## 2020-09-02 DIAGNOSIS — M810 Age-related osteoporosis without current pathological fracture: Secondary | ICD-10-CM | POA: Diagnosis not present

## 2020-09-02 DIAGNOSIS — I2510 Atherosclerotic heart disease of native coronary artery without angina pectoris: Secondary | ICD-10-CM | POA: Diagnosis not present

## 2020-09-02 DIAGNOSIS — F329 Major depressive disorder, single episode, unspecified: Secondary | ICD-10-CM | POA: Diagnosis not present

## 2020-09-02 DIAGNOSIS — M545 Low back pain, unspecified: Secondary | ICD-10-CM | POA: Diagnosis not present

## 2020-09-02 DIAGNOSIS — I1310 Hypertensive heart and chronic kidney disease without heart failure, with stage 1 through stage 4 chronic kidney disease, or unspecified chronic kidney disease: Secondary | ICD-10-CM | POA: Diagnosis not present

## 2020-09-02 DIAGNOSIS — G8929 Other chronic pain: Secondary | ICD-10-CM | POA: Diagnosis not present

## 2020-09-02 DIAGNOSIS — F419 Anxiety disorder, unspecified: Secondary | ICD-10-CM | POA: Diagnosis not present

## 2020-09-04 DIAGNOSIS — M5459 Other low back pain: Secondary | ICD-10-CM | POA: Diagnosis not present

## 2020-09-04 DIAGNOSIS — M4856XA Collapsed vertebra, not elsewhere classified, lumbar region, initial encounter for fracture: Secondary | ICD-10-CM | POA: Diagnosis not present

## 2020-09-04 DIAGNOSIS — M810 Age-related osteoporosis without current pathological fracture: Secondary | ICD-10-CM | POA: Diagnosis not present

## 2020-09-04 DIAGNOSIS — R262 Difficulty in walking, not elsewhere classified: Secondary | ICD-10-CM | POA: Diagnosis not present

## 2020-09-05 DIAGNOSIS — F419 Anxiety disorder, unspecified: Secondary | ICD-10-CM | POA: Diagnosis not present

## 2020-09-05 DIAGNOSIS — G8929 Other chronic pain: Secondary | ICD-10-CM | POA: Diagnosis not present

## 2020-09-05 DIAGNOSIS — H903 Sensorineural hearing loss, bilateral: Secondary | ICD-10-CM | POA: Diagnosis not present

## 2020-09-05 DIAGNOSIS — M810 Age-related osteoporosis without current pathological fracture: Secondary | ICD-10-CM | POA: Diagnosis not present

## 2020-09-05 DIAGNOSIS — I1310 Hypertensive heart and chronic kidney disease without heart failure, with stage 1 through stage 4 chronic kidney disease, or unspecified chronic kidney disease: Secondary | ICD-10-CM | POA: Diagnosis not present

## 2020-09-05 DIAGNOSIS — F329 Major depressive disorder, single episode, unspecified: Secondary | ICD-10-CM | POA: Diagnosis not present

## 2020-09-05 DIAGNOSIS — M545 Low back pain, unspecified: Secondary | ICD-10-CM | POA: Diagnosis not present

## 2020-09-05 DIAGNOSIS — I2510 Atherosclerotic heart disease of native coronary artery without angina pectoris: Secondary | ICD-10-CM | POA: Diagnosis not present

## 2020-09-05 DIAGNOSIS — N183 Chronic kidney disease, stage 3 unspecified: Secondary | ICD-10-CM | POA: Diagnosis not present

## 2020-09-06 DIAGNOSIS — I2510 Atherosclerotic heart disease of native coronary artery without angina pectoris: Secondary | ICD-10-CM | POA: Diagnosis not present

## 2020-09-06 DIAGNOSIS — N183 Chronic kidney disease, stage 3 unspecified: Secondary | ICD-10-CM | POA: Diagnosis not present

## 2020-09-06 DIAGNOSIS — G8929 Other chronic pain: Secondary | ICD-10-CM | POA: Diagnosis not present

## 2020-09-06 DIAGNOSIS — M545 Low back pain, unspecified: Secondary | ICD-10-CM | POA: Diagnosis not present

## 2020-09-06 DIAGNOSIS — F419 Anxiety disorder, unspecified: Secondary | ICD-10-CM | POA: Diagnosis not present

## 2020-09-06 DIAGNOSIS — I1310 Hypertensive heart and chronic kidney disease without heart failure, with stage 1 through stage 4 chronic kidney disease, or unspecified chronic kidney disease: Secondary | ICD-10-CM | POA: Diagnosis not present

## 2020-09-06 DIAGNOSIS — H903 Sensorineural hearing loss, bilateral: Secondary | ICD-10-CM | POA: Diagnosis not present

## 2020-09-06 DIAGNOSIS — F329 Major depressive disorder, single episode, unspecified: Secondary | ICD-10-CM | POA: Diagnosis not present

## 2020-09-06 DIAGNOSIS — M810 Age-related osteoporosis without current pathological fracture: Secondary | ICD-10-CM | POA: Diagnosis not present

## 2020-09-07 DIAGNOSIS — M545 Low back pain, unspecified: Secondary | ICD-10-CM | POA: Diagnosis not present

## 2020-09-07 DIAGNOSIS — I1310 Hypertensive heart and chronic kidney disease without heart failure, with stage 1 through stage 4 chronic kidney disease, or unspecified chronic kidney disease: Secondary | ICD-10-CM | POA: Diagnosis not present

## 2020-09-07 DIAGNOSIS — H903 Sensorineural hearing loss, bilateral: Secondary | ICD-10-CM | POA: Diagnosis not present

## 2020-09-07 DIAGNOSIS — F419 Anxiety disorder, unspecified: Secondary | ICD-10-CM | POA: Diagnosis not present

## 2020-09-07 DIAGNOSIS — I2510 Atherosclerotic heart disease of native coronary artery without angina pectoris: Secondary | ICD-10-CM | POA: Diagnosis not present

## 2020-09-07 DIAGNOSIS — G8929 Other chronic pain: Secondary | ICD-10-CM | POA: Diagnosis not present

## 2020-09-07 DIAGNOSIS — F329 Major depressive disorder, single episode, unspecified: Secondary | ICD-10-CM | POA: Diagnosis not present

## 2020-09-07 DIAGNOSIS — M810 Age-related osteoporosis without current pathological fracture: Secondary | ICD-10-CM | POA: Diagnosis not present

## 2020-09-07 DIAGNOSIS — N183 Chronic kidney disease, stage 3 unspecified: Secondary | ICD-10-CM | POA: Diagnosis not present

## 2020-09-11 DIAGNOSIS — M810 Age-related osteoporosis without current pathological fracture: Secondary | ICD-10-CM | POA: Diagnosis not present

## 2020-09-11 DIAGNOSIS — H903 Sensorineural hearing loss, bilateral: Secondary | ICD-10-CM | POA: Diagnosis not present

## 2020-09-11 DIAGNOSIS — M545 Low back pain, unspecified: Secondary | ICD-10-CM | POA: Diagnosis not present

## 2020-09-11 DIAGNOSIS — F419 Anxiety disorder, unspecified: Secondary | ICD-10-CM | POA: Diagnosis not present

## 2020-09-11 DIAGNOSIS — I1310 Hypertensive heart and chronic kidney disease without heart failure, with stage 1 through stage 4 chronic kidney disease, or unspecified chronic kidney disease: Secondary | ICD-10-CM | POA: Diagnosis not present

## 2020-09-11 DIAGNOSIS — I2510 Atherosclerotic heart disease of native coronary artery without angina pectoris: Secondary | ICD-10-CM | POA: Diagnosis not present

## 2020-09-11 DIAGNOSIS — F329 Major depressive disorder, single episode, unspecified: Secondary | ICD-10-CM | POA: Diagnosis not present

## 2020-09-11 DIAGNOSIS — G8929 Other chronic pain: Secondary | ICD-10-CM | POA: Diagnosis not present

## 2020-09-11 DIAGNOSIS — N183 Chronic kidney disease, stage 3 unspecified: Secondary | ICD-10-CM | POA: Diagnosis not present

## 2020-09-12 DIAGNOSIS — I2510 Atherosclerotic heart disease of native coronary artery without angina pectoris: Secondary | ICD-10-CM | POA: Diagnosis not present

## 2020-09-12 DIAGNOSIS — H903 Sensorineural hearing loss, bilateral: Secondary | ICD-10-CM | POA: Diagnosis not present

## 2020-09-12 DIAGNOSIS — F329 Major depressive disorder, single episode, unspecified: Secondary | ICD-10-CM | POA: Diagnosis not present

## 2020-09-12 DIAGNOSIS — N183 Chronic kidney disease, stage 3 unspecified: Secondary | ICD-10-CM | POA: Diagnosis not present

## 2020-09-12 DIAGNOSIS — M810 Age-related osteoporosis without current pathological fracture: Secondary | ICD-10-CM | POA: Diagnosis not present

## 2020-09-12 DIAGNOSIS — I1310 Hypertensive heart and chronic kidney disease without heart failure, with stage 1 through stage 4 chronic kidney disease, or unspecified chronic kidney disease: Secondary | ICD-10-CM | POA: Diagnosis not present

## 2020-09-12 DIAGNOSIS — F419 Anxiety disorder, unspecified: Secondary | ICD-10-CM | POA: Diagnosis not present

## 2020-09-12 DIAGNOSIS — M545 Low back pain, unspecified: Secondary | ICD-10-CM | POA: Diagnosis not present

## 2020-09-12 DIAGNOSIS — G8929 Other chronic pain: Secondary | ICD-10-CM | POA: Diagnosis not present

## 2020-09-13 DIAGNOSIS — F419 Anxiety disorder, unspecified: Secondary | ICD-10-CM | POA: Diagnosis not present

## 2020-09-13 DIAGNOSIS — M810 Age-related osteoporosis without current pathological fracture: Secondary | ICD-10-CM | POA: Diagnosis not present

## 2020-09-13 DIAGNOSIS — I2510 Atherosclerotic heart disease of native coronary artery without angina pectoris: Secondary | ICD-10-CM | POA: Diagnosis not present

## 2020-09-13 DIAGNOSIS — G8929 Other chronic pain: Secondary | ICD-10-CM | POA: Diagnosis not present

## 2020-09-13 DIAGNOSIS — N183 Chronic kidney disease, stage 3 unspecified: Secondary | ICD-10-CM | POA: Diagnosis not present

## 2020-09-13 DIAGNOSIS — F329 Major depressive disorder, single episode, unspecified: Secondary | ICD-10-CM | POA: Diagnosis not present

## 2020-09-13 DIAGNOSIS — M545 Low back pain, unspecified: Secondary | ICD-10-CM | POA: Diagnosis not present

## 2020-09-13 DIAGNOSIS — H903 Sensorineural hearing loss, bilateral: Secondary | ICD-10-CM | POA: Diagnosis not present

## 2020-09-13 DIAGNOSIS — I1310 Hypertensive heart and chronic kidney disease without heart failure, with stage 1 through stage 4 chronic kidney disease, or unspecified chronic kidney disease: Secondary | ICD-10-CM | POA: Diagnosis not present

## 2020-09-14 ENCOUNTER — Emergency Department (HOSPITAL_COMMUNITY)
Admission: EM | Admit: 2020-09-14 | Discharge: 2020-09-14 | Disposition: A | Discharge status: Home / Self Care | Payer: Medicare PPO | Attending: Emergency Medicine | Admitting: Emergency Medicine

## 2020-09-14 ENCOUNTER — Encounter (HOSPITAL_COMMUNITY): Payer: Self-pay

## 2020-09-14 ENCOUNTER — Emergency Department (HOSPITAL_COMMUNITY): Payer: Medicare PPO

## 2020-09-14 DIAGNOSIS — Z91040 Latex allergy status: Secondary | ICD-10-CM | POA: Diagnosis not present

## 2020-09-14 DIAGNOSIS — Z853 Personal history of malignant neoplasm of breast: Secondary | ICD-10-CM | POA: Diagnosis not present

## 2020-09-14 DIAGNOSIS — I129 Hypertensive chronic kidney disease with stage 1 through stage 4 chronic kidney disease, or unspecified chronic kidney disease: Secondary | ICD-10-CM | POA: Diagnosis not present

## 2020-09-14 DIAGNOSIS — Z7982 Long term (current) use of aspirin: Secondary | ICD-10-CM | POA: Diagnosis not present

## 2020-09-14 DIAGNOSIS — Z79899 Other long term (current) drug therapy: Secondary | ICD-10-CM | POA: Diagnosis not present

## 2020-09-14 DIAGNOSIS — K5900 Constipation, unspecified: Secondary | ICD-10-CM | POA: Diagnosis not present

## 2020-09-14 DIAGNOSIS — N183 Chronic kidney disease, stage 3 unspecified: Secondary | ICD-10-CM | POA: Diagnosis not present

## 2020-09-14 DIAGNOSIS — I2510 Atherosclerotic heart disease of native coronary artery without angina pectoris: Secondary | ICD-10-CM | POA: Diagnosis not present

## 2020-09-14 DIAGNOSIS — Z87891 Personal history of nicotine dependence: Secondary | ICD-10-CM | POA: Diagnosis not present

## 2020-09-14 LAB — CBC WITH DIFFERENTIAL/PLATELET
Abs Immature Granulocytes: 0.04 10*3/uL (ref 0.00–0.07)
Basophils Absolute: 0 10*3/uL (ref 0.0–0.1)
Basophils Relative: 1 %
Eosinophils Absolute: 0.1 10*3/uL (ref 0.0–0.5)
Eosinophils Relative: 1 %
HCT: 39.9 % (ref 36.0–46.0)
Hemoglobin: 12.5 g/dL (ref 12.0–15.0)
Immature Granulocytes: 1 %
Lymphocytes Relative: 10 %
Lymphs Abs: 0.8 10*3/uL (ref 0.7–4.0)
MCH: 30 pg (ref 26.0–34.0)
MCHC: 31.3 g/dL (ref 30.0–36.0)
MCV: 95.7 fL (ref 80.0–100.0)
Monocytes Absolute: 0.5 10*3/uL (ref 0.1–1.0)
Monocytes Relative: 6 %
Neutro Abs: 6.8 10*3/uL (ref 1.7–7.7)
Neutrophils Relative %: 81 %
Platelets: 213 10*3/uL (ref 150–400)
RBC: 4.17 MIL/uL (ref 3.87–5.11)
RDW: 14.3 % (ref 11.5–15.5)
WBC: 8.3 10*3/uL (ref 4.0–10.5)
nRBC: 0 % (ref 0.0–0.2)

## 2020-09-14 LAB — COMPREHENSIVE METABOLIC PANEL
ALT: 15 U/L (ref 0–44)
AST: 17 U/L (ref 15–41)
Albumin: 4.3 g/dL (ref 3.5–5.0)
Alkaline Phosphatase: 106 U/L (ref 38–126)
Anion gap: 8 (ref 5–15)
BUN: 18 mg/dL (ref 8–23)
CO2: 26 mmol/L (ref 22–32)
Calcium: 8.9 mg/dL (ref 8.9–10.3)
Chloride: 100 mmol/L (ref 98–111)
Creatinine, Ser: 0.75 mg/dL (ref 0.44–1.00)
GFR, Estimated: >60 mL/min (ref >60)
Glucose, Bld: 112 mg/dL — ABNORMAL HIGH (ref 70–99)
Potassium: 4.3 mmol/L (ref 3.5–5.1)
Sodium: 134 mmol/L — ABNORMAL LOW (ref 135–145)
Total Bilirubin: 0.5 mg/dL (ref 0.3–1.2)
Total Protein: 7.5 g/dL (ref 6.5–8.1)

## 2020-09-14 MED ORDER — POLYETHYLENE GLYCOL 3350 17 G PO PACK: 17.0000 g | PACK | Freq: Two times a day (BID) | ORAL | 0 refills | Status: AC

## 2020-09-14 MED ORDER — MAGNESIUM CITRATE PO SOLN: 1.0000 | Freq: Once | ORAL | 1 refills | Status: AC

## 2020-09-14 NOTE — ED Provider Notes (Signed)
Emergency Medicine Provider Triage Evaluation Note  Carmen Haney , a 87 y.o. female  was evaluated in triage.  Pt complains of several days of constipation.  Patient states that when she tries to use the restroom she has spasms in her abdomen.  She has tried enema and laxatives without relief.   Review of Systems  Positive: constipation Negative: Nausea and vomiting  Physical Exam  BP (!) 157/86 (BP Location: Right Arm)   Pulse 80   Temp 98.3 F (36.8 C) (Oral)   Resp 16   SpO2 97%  Gen:   Awake, no distress   Resp:  Normal effort  MSK:   Moves extremities without difficulty  Other:    Medical Decision Making  Medically screening exam initiated at 5:08 PM.  Appropriate orders placed.  Carmen Haney was informed that the remainder of the evaluation will be completed by another provider, this initial triage assessment does not replace that evaluation, and the importance of remaining in the ED until their evaluation is complete.     Carlisle Cater, PA-C 09/14/20 1709    Dorie Rank, MD 09/14/20 [phone]

## 2020-09-14 NOTE — ED Triage Notes (Signed)
Pt c/o constipation for the past 2-3 days. Pt has tried enemas and laxatives with no relief

## 2020-09-14 NOTE — Discharge Instructions (Addendum)
Take medications as prescribed for constipation.  Follow-up with your doctor next week to be rechecked.  Return as needed for worsening symptoms

## 2020-09-14 NOTE — ED Provider Notes (Signed)
[facility] DEPT Provider Note   CSN: [id number] Arrival date & time: 09/14/20  1618     History Chief Complaint  Patient presents with   Constipation    Carmen Haney is a 87 y.o. female.   Constipation  Patient presented to the ED for evaluation of constipation.  Patient states she has been having back issues recently.  She was started on Ultram for the pain.  At that time she has noticed issues with constipation.  Patient tried taking medications at home including Colace without relief.  She also ended up trying mineral oil enema but did not think she was able to administer that properly.  Started having increasing pain and discomfort in her rectal area.  She called the nurse hotline and they suggested she come to the ED.  She denies any fevers or chills.  No vomiting.  Past Medical History:  Diagnosis Date   ACE-inhibitor cough    Breast CA [facility])    s/p lumpectomy & radiation   Breast cancer ([place]) 2010   Right Breast Cancer   CAD (coronary artery disease) 07/1998   s/p cath in 2000. L main with 30 to 40%   Hearing loss    HTN (hypertension)    Hyperlipidemia    Obesities, morbid ([place])    Osteoporosis    Personal history of radiation therapy 2010   Right Breast Cancer   Thyroid disease    Vertebral fracture, pathological     Patient Active Problem List   Diagnosis Date Noted   Personal history of colonic polyps 08/21/2020   Other specified symptoms and signs involving the circulatory and respiratory systems 08/21/2020   Other abnormalities of gait and mobility 08/21/2020   Major depression 08/21/2020   Chronic kidney disease (CKD), stage III (moderate) (HCC) 08/21/2020   Age-related osteoporosis without current pathological fracture 08/21/2020   Abnormal digestive tract function 08/21/2020   Chronic pain of right knee 08/15/2019   Vertebral fracture, pathological    Thyroid disease    Personal history of radiation therapy    Breast  CA ([place])    Genetic testing 10/22/2016   Sensorineural hearing loss (SNHL), bilateral 07/19/2015   Anxiety disorder 07/19/2015   Sclerosing adenosis of left breast 11/15/2014   Bruit 08/03/2013   Family history of malignant neoplasm of breast 07/28/2013   Neoplasm of right breast, primary tumor staging category Tis: ductal carcinoma in situ (DCIS) 10/20/2012   Obesities, morbid (H[facility])    HTN (hypertension)    Hyperlipidemia    CAD (coronary artery disease) 07/13/1998    Past Surgical History:  Procedure Laterality Date   BREAST BIOPSY Left    BREAST LUMPECTOMY Right March 2010   BREAST LUMPECTOMY WITH RADIOACTIVE SEED LOCALIZATION Left 09/20/2014   Procedure: LEFT BREAST LUMPECTOMY WITH RADIOACTIVE SEED LOCALIZATION;  Surgeon: Autumn Messing III, MD;  Location: [place];  Service: General;  Laterality: Left;   CARPAL TUNNEL RELEASE Right    CESAREAN SECTION     EYE SURGERY     Right Eye   Skin Cancer Removed from Right Forearm     TOTAL ABDOMINAL HYSTERECTOMY       OB History   No obstetric history on file.     Family History  Problem Relation Age of Onset   Coronary artery disease Mother    Hypertension Mother    Melanoma Mother    Cancer Mother 22       breast   Breast cancer  Mother    Cancer Father 14       prostate   Cancer Sister 75       breast   Breast cancer Sister    Cancer Maternal Aunt 43       breast   Cancer Sister 74       breast   Breast cancer Sister    Breast cancer Daughter 80    Social History   Tobacco Use   Smoking status: Former    Types: Cigarettes    Quit date: 01/13/1963    Years since quitting: 57.7   Smokeless tobacco: Never   Tobacco comments:    stopped in 1970's  Vaping Use   Vaping Use: Never used  Substance Use Topics   Alcohol use: No    Alcohol/week: 0.0 standard drinks   Drug use: No    Home Medications Prior to Admission medications   Medication Sig Start Date End Date Taking? Authorizing Provider   magnesium citrate SOLN Take 296 mLs (1 Bottle total) by mouth once for 1 dose. Take half a bottle to 1 bottle as needed for severe constipation 09/14/20 09/14/20 Yes Dorie Rank, MD  polyethylene glycol (MIRALAX) 17 g packet Take 17 g by mouth 2 (two) times daily for 14 days. 09/14/20 09/28/20 Yes Dorie Rank, MD  aspirin 81 MG tablet Take 81 mg by mouth daily.      [provider]  Calcium Carbonate-Vitamin D (CALCIUM + D PO) Take by mouth 2 (two) times daily.      [provider]  furosemide (LASIX) 20 MG tablet TAKE ONE TABLET BY MOUTH DAILY 12/19/19   Lelon Perla, MD  Levothyroxine Sodium 100 MCG CAPS Take 100 mcg by mouth daily before breakfast.    [provider]  LORazepam (ATIVAN) 0.5 MG tablet Take 0.25 mg by mouth every 8 (eight) hours.    [provider]  losartan (COZAAR) 50 MG tablet TAKE ONE TABLET BY MOUTH DAILY 03/25/20   Lelon Perla, MD  sertraline (ZOLOFT) 100 MG tablet Take 200 mg by mouth daily.  10/29/15   [provider]  simvastatin (ZOCOR) 10 MG tablet Take 10 mg by mouth daily at 6 PM.     [provider]  olmesartan (BENICAR) 20 MG tablet Take 20 mg by mouth daily.    04/25/10  [provider]    Allergies    Ace inhibitors and Latex  Review of Systems   Review of Systems  Gastrointestinal:  Positive for constipation.  All other systems reviewed and are negative.  Physical Exam Updated Vital Signs BP (!) 149/79   Pulse 71   Temp 98.3 F (36.8 C) (Oral)   Resp 16   SpO2 95%   Physical Exam Vitals and nursing note reviewed.  Constitutional:      General: She is not in acute distress.    Appearance: She is well-developed.  HENT:     Head: Normocephalic and atraumatic.     Right Ear: External ear normal.     Left Ear: External ear normal.  Eyes:     General: No scleral icterus.       Right eye: No discharge.        Left eye: No discharge.     Conjunctiva/sclera: Conjunctivae normal.   Neck:     Trachea: No tracheal deviation.  Cardiovascular:     Rate and Rhythm: Normal rate and regular rhythm.  Pulmonary:     Effort: Pulmonary  effort is normal. No respiratory distress.     Breath sounds: Normal breath sounds. No stridor. No wheezing or rales.  Abdominal:     General: Bowel sounds are normal. There is no distension.     Palpations: Abdomen is soft.     Tenderness: There is no abdominal tenderness. There is no guarding or rebound.  Genitourinary:    Comments: Small amount of soft stool noted in the rectal vault, no fecal impaction Musculoskeletal:        General: No tenderness or deformity.     Cervical back: Neck supple.  Skin:    General: Skin is warm and dry.     Findings: No rash.  Neurological:     General: No focal deficit present.     Mental Status: She is alert.     Cranial Nerves: No cranial nerve deficit (no facial droop, extraocular movements intact, no slurred speech).     Sensory: No sensory deficit.     Motor: No abnormal muscle tone or seizure activity.     Coordination: Coordination normal.  Psychiatric:        Mood and Affect: Mood normal.    ED Results / Procedures / Treatments   Labs (all labs ordered are listed, but only abnormal results are displayed) Labs Reviewed  COMPREHENSIVE METABOLIC PANEL - Abnormal; Notable for the following components:      Result Value   Sodium 134 (*)    Glucose, Bld 112 (*)    All other components within normal limits  CBC WITH DIFFERENTIAL/PLATELET    EKG None  Radiology DG Abdomen 1 View  Result Date: 09/14/2020 CLINICAL DATA:  Constipation. EXAM: ABDOMEN - 1 VIEW COMPARISON:  None. FINDINGS: Divided supine views of the abdomen obtained. No evidence of free intra-abdominal air. No bowel dilatation to suggest obstruction. Moderate volume of stool throughout the entire colon. There is stool distending the rectum with rectal distention of 6.4 cm. Serpiginous density extending across the lower  abdomen/pelvis is presumably external to the patient and extends beyond the peritoneal reflection. No obvious radiopaque calculi. Lung bases are clear. Prominent heart size. No acute osseous abnormalities are seen. IMPRESSION: Moderate volume of colonic stool with stool distending the rectum, rectal distention of 6.4 cm. No bowel obstruction. Electronically Signed   By: Keith Rake M.D.   On: 09/14/2020 17:26    Procedures Procedures   Medications Ordered in ED Medications - No data to display  ED Course  I have reviewed the triage vital signs and the nursing notes.  Pertinent labs & imaging results that were available during my care of the patient were reviewed by me and considered in my medical decision making (see chart for details).  Clinical Course as of 09/14/20 2104  Sat Sep 14, 2020  2046 Labs reviewed.  Metabolic panel is normal.  CBC is normal. [JK]  2046 Abdominal x-ray shows constipation and rectal stool [JK]  2058 Reviewed findings with patient.  She did have a large bowel movement after the x-ray was done [JK]    Clinical Course User Index [JK] Dorie Rank, MD   MDM Rules/Calculators/A&P                           Patient presented to the ED for evaluation of constipation.  Patient's abdominal exam is benign.  No findings to suggest bowel obstruction colitis diverticulitis.  Patient's laboratory tests are reassuring.  Her x-ray did show constipation and evidence  of fluid in the rectal vault however the patient did have a large bowel movement after having any x-ray.  She is feeling much better now.  We will plan on having her continue with laxative medications at home.  This was negative with PCP. Final Clinical Impression(s) / ED Diagnoses Final diagnoses:  Constipation, unspecified constipation type    Rx / DC Orders ED Discharge Orders          Ordered    polyethylene glycol (MIRALAX) 17 g packet  2 times daily        09/14/20 2103    magnesium citrate SOLN    Once        09/14/20 2103             Dorie Rank, MD 09/14/20 2107

## 2020-09-16 DIAGNOSIS — I1310 Hypertensive heart and chronic kidney disease without heart failure, with stage 1 through stage 4 chronic kidney disease, or unspecified chronic kidney disease: Secondary | ICD-10-CM | POA: Diagnosis not present

## 2020-09-16 DIAGNOSIS — I2510 Atherosclerotic heart disease of native coronary artery without angina pectoris: Secondary | ICD-10-CM | POA: Diagnosis not present

## 2020-09-16 DIAGNOSIS — F329 Major depressive disorder, single episode, unspecified: Secondary | ICD-10-CM | POA: Diagnosis not present

## 2020-09-16 DIAGNOSIS — H903 Sensorineural hearing loss, bilateral: Secondary | ICD-10-CM | POA: Diagnosis not present

## 2020-09-16 DIAGNOSIS — G8929 Other chronic pain: Secondary | ICD-10-CM | POA: Diagnosis not present

## 2020-09-16 DIAGNOSIS — N183 Chronic kidney disease, stage 3 unspecified: Secondary | ICD-10-CM | POA: Diagnosis not present

## 2020-09-16 DIAGNOSIS — M545 Low back pain, unspecified: Secondary | ICD-10-CM | POA: Diagnosis not present

## 2020-09-16 DIAGNOSIS — M810 Age-related osteoporosis without current pathological fracture: Secondary | ICD-10-CM | POA: Diagnosis not present

## 2020-09-16 DIAGNOSIS — F419 Anxiety disorder, unspecified: Secondary | ICD-10-CM | POA: Diagnosis not present

## 2020-09-18 DIAGNOSIS — M545 Low back pain, unspecified: Secondary | ICD-10-CM | POA: Diagnosis not present

## 2020-09-18 DIAGNOSIS — F419 Anxiety disorder, unspecified: Secondary | ICD-10-CM | POA: Diagnosis not present

## 2020-09-18 DIAGNOSIS — I2510 Atherosclerotic heart disease of native coronary artery without angina pectoris: Secondary | ICD-10-CM | POA: Diagnosis not present

## 2020-09-18 DIAGNOSIS — I1310 Hypertensive heart and chronic kidney disease without heart failure, with stage 1 through stage 4 chronic kidney disease, or unspecified chronic kidney disease: Secondary | ICD-10-CM | POA: Diagnosis not present

## 2020-09-18 DIAGNOSIS — F329 Major depressive disorder, single episode, unspecified: Secondary | ICD-10-CM | POA: Diagnosis not present

## 2020-09-18 DIAGNOSIS — H903 Sensorineural hearing loss, bilateral: Secondary | ICD-10-CM | POA: Diagnosis not present

## 2020-09-18 DIAGNOSIS — N183 Chronic kidney disease, stage 3 unspecified: Secondary | ICD-10-CM | POA: Diagnosis not present

## 2020-09-18 DIAGNOSIS — G8929 Other chronic pain: Secondary | ICD-10-CM | POA: Diagnosis not present

## 2020-09-18 DIAGNOSIS — M810 Age-related osteoporosis without current pathological fracture: Secondary | ICD-10-CM | POA: Diagnosis not present

## 2020-09-19 DIAGNOSIS — H903 Sensorineural hearing loss, bilateral: Secondary | ICD-10-CM | POA: Diagnosis not present

## 2020-09-19 DIAGNOSIS — F329 Major depressive disorder, single episode, unspecified: Secondary | ICD-10-CM | POA: Diagnosis not present

## 2020-09-19 DIAGNOSIS — N183 Chronic kidney disease, stage 3 unspecified: Secondary | ICD-10-CM | POA: Diagnosis not present

## 2020-09-19 DIAGNOSIS — I1310 Hypertensive heart and chronic kidney disease without heart failure, with stage 1 through stage 4 chronic kidney disease, or unspecified chronic kidney disease: Secondary | ICD-10-CM | POA: Diagnosis not present

## 2020-09-19 DIAGNOSIS — F419 Anxiety disorder, unspecified: Secondary | ICD-10-CM | POA: Diagnosis not present

## 2020-09-19 DIAGNOSIS — M810 Age-related osteoporosis without current pathological fracture: Secondary | ICD-10-CM | POA: Diagnosis not present

## 2020-09-19 DIAGNOSIS — I2510 Atherosclerotic heart disease of native coronary artery without angina pectoris: Secondary | ICD-10-CM | POA: Diagnosis not present

## 2020-09-19 DIAGNOSIS — G8929 Other chronic pain: Secondary | ICD-10-CM | POA: Diagnosis not present

## 2020-09-19 DIAGNOSIS — M545 Low back pain, unspecified: Secondary | ICD-10-CM | POA: Diagnosis not present

## 2020-09-20 DIAGNOSIS — I2510 Atherosclerotic heart disease of native coronary artery without angina pectoris: Secondary | ICD-10-CM | POA: Diagnosis not present

## 2020-09-20 DIAGNOSIS — M545 Low back pain, unspecified: Secondary | ICD-10-CM | POA: Diagnosis not present

## 2020-09-20 DIAGNOSIS — H903 Sensorineural hearing loss, bilateral: Secondary | ICD-10-CM | POA: Diagnosis not present

## 2020-09-20 DIAGNOSIS — F329 Major depressive disorder, single episode, unspecified: Secondary | ICD-10-CM | POA: Diagnosis not present

## 2020-09-20 DIAGNOSIS — G8929 Other chronic pain: Secondary | ICD-10-CM | POA: Diagnosis not present

## 2020-09-20 DIAGNOSIS — F419 Anxiety disorder, unspecified: Secondary | ICD-10-CM | POA: Diagnosis not present

## 2020-09-20 DIAGNOSIS — N183 Chronic kidney disease, stage 3 unspecified: Secondary | ICD-10-CM | POA: Diagnosis not present

## 2020-09-20 DIAGNOSIS — I1310 Hypertensive heart and chronic kidney disease without heart failure, with stage 1 through stage 4 chronic kidney disease, or unspecified chronic kidney disease: Secondary | ICD-10-CM | POA: Diagnosis not present

## 2020-09-20 DIAGNOSIS — M810 Age-related osteoporosis without current pathological fracture: Secondary | ICD-10-CM | POA: Diagnosis not present

## 2020-09-23 DIAGNOSIS — I2510 Atherosclerotic heart disease of native coronary artery without angina pectoris: Secondary | ICD-10-CM | POA: Diagnosis not present

## 2020-09-23 DIAGNOSIS — N183 Chronic kidney disease, stage 3 unspecified: Secondary | ICD-10-CM | POA: Diagnosis not present

## 2020-09-23 DIAGNOSIS — F329 Major depressive disorder, single episode, unspecified: Secondary | ICD-10-CM | POA: Diagnosis not present

## 2020-09-23 DIAGNOSIS — M545 Low back pain, unspecified: Secondary | ICD-10-CM | POA: Diagnosis not present

## 2020-09-23 DIAGNOSIS — H903 Sensorineural hearing loss, bilateral: Secondary | ICD-10-CM | POA: Diagnosis not present

## 2020-09-23 DIAGNOSIS — G8929 Other chronic pain: Secondary | ICD-10-CM | POA: Diagnosis not present

## 2020-09-23 DIAGNOSIS — I1310 Hypertensive heart and chronic kidney disease without heart failure, with stage 1 through stage 4 chronic kidney disease, or unspecified chronic kidney disease: Secondary | ICD-10-CM | POA: Diagnosis not present

## 2020-09-23 DIAGNOSIS — M810 Age-related osteoporosis without current pathological fracture: Secondary | ICD-10-CM | POA: Diagnosis not present

## 2020-09-23 DIAGNOSIS — F419 Anxiety disorder, unspecified: Secondary | ICD-10-CM | POA: Diagnosis not present

## 2020-09-25 DIAGNOSIS — I2510 Atherosclerotic heart disease of native coronary artery without angina pectoris: Secondary | ICD-10-CM | POA: Diagnosis not present

## 2020-09-25 DIAGNOSIS — G479 Sleep disorder, unspecified: Secondary | ICD-10-CM | POA: Diagnosis not present

## 2020-09-25 DIAGNOSIS — N183 Chronic kidney disease, stage 3 unspecified: Secondary | ICD-10-CM | POA: Diagnosis not present

## 2020-09-25 DIAGNOSIS — F419 Anxiety disorder, unspecified: Secondary | ICD-10-CM | POA: Diagnosis not present

## 2020-09-25 DIAGNOSIS — S32000A Wedge compression fracture of unspecified lumbar vertebra, initial encounter for closed fracture: Secondary | ICD-10-CM | POA: Diagnosis not present

## 2020-09-25 DIAGNOSIS — F322 Major depressive disorder, single episode, severe without psychotic features: Secondary | ICD-10-CM | POA: Diagnosis not present

## 2020-09-25 DIAGNOSIS — R609 Edema, unspecified: Secondary | ICD-10-CM | POA: Diagnosis not present

## 2020-09-25 DIAGNOSIS — M545 Low back pain, unspecified: Secondary | ICD-10-CM | POA: Diagnosis not present

## 2020-09-25 DIAGNOSIS — Z23 Encounter for immunization: Secondary | ICD-10-CM | POA: Diagnosis not present

## 2020-09-25 DIAGNOSIS — M79671 Pain in right foot: Secondary | ICD-10-CM | POA: Diagnosis not present

## 2020-09-25 DIAGNOSIS — F329 Major depressive disorder, single episode, unspecified: Secondary | ICD-10-CM | POA: Diagnosis not present

## 2020-09-25 DIAGNOSIS — G8929 Other chronic pain: Secondary | ICD-10-CM | POA: Diagnosis not present

## 2020-09-25 DIAGNOSIS — H903 Sensorineural hearing loss, bilateral: Secondary | ICD-10-CM | POA: Diagnosis not present

## 2020-09-25 DIAGNOSIS — M810 Age-related osteoporosis without current pathological fracture: Secondary | ICD-10-CM | POA: Diagnosis not present

## 2020-09-25 DIAGNOSIS — F4321 Adjustment disorder with depressed mood: Secondary | ICD-10-CM | POA: Diagnosis not present

## 2020-09-25 DIAGNOSIS — I1310 Hypertensive heart and chronic kidney disease without heart failure, with stage 1 through stage 4 chronic kidney disease, or unspecified chronic kidney disease: Secondary | ICD-10-CM | POA: Diagnosis not present

## 2020-09-27 DIAGNOSIS — M545 Low back pain, unspecified: Secondary | ICD-10-CM | POA: Diagnosis not present

## 2020-09-27 DIAGNOSIS — G8929 Other chronic pain: Secondary | ICD-10-CM | POA: Diagnosis not present

## 2020-09-27 DIAGNOSIS — I2510 Atherosclerotic heart disease of native coronary artery without angina pectoris: Secondary | ICD-10-CM | POA: Diagnosis not present

## 2020-09-27 DIAGNOSIS — M810 Age-related osteoporosis without current pathological fracture: Secondary | ICD-10-CM | POA: Diagnosis not present

## 2020-09-27 DIAGNOSIS — F419 Anxiety disorder, unspecified: Secondary | ICD-10-CM | POA: Diagnosis not present

## 2020-09-27 DIAGNOSIS — F329 Major depressive disorder, single episode, unspecified: Secondary | ICD-10-CM | POA: Diagnosis not present

## 2020-09-27 DIAGNOSIS — I1310 Hypertensive heart and chronic kidney disease without heart failure, with stage 1 through stage 4 chronic kidney disease, or unspecified chronic kidney disease: Secondary | ICD-10-CM | POA: Diagnosis not present

## 2020-09-27 DIAGNOSIS — N183 Chronic kidney disease, stage 3 unspecified: Secondary | ICD-10-CM | POA: Diagnosis not present

## 2020-09-27 DIAGNOSIS — H903 Sensorineural hearing loss, bilateral: Secondary | ICD-10-CM | POA: Diagnosis not present

## 2020-09-30 DIAGNOSIS — I1310 Hypertensive heart and chronic kidney disease without heart failure, with stage 1 through stage 4 chronic kidney disease, or unspecified chronic kidney disease: Secondary | ICD-10-CM | POA: Diagnosis not present

## 2020-09-30 DIAGNOSIS — H903 Sensorineural hearing loss, bilateral: Secondary | ICD-10-CM | POA: Diagnosis not present

## 2020-09-30 DIAGNOSIS — F329 Major depressive disorder, single episode, unspecified: Secondary | ICD-10-CM | POA: Diagnosis not present

## 2020-09-30 DIAGNOSIS — G8929 Other chronic pain: Secondary | ICD-10-CM | POA: Diagnosis not present

## 2020-09-30 DIAGNOSIS — M545 Low back pain, unspecified: Secondary | ICD-10-CM | POA: Diagnosis not present

## 2020-09-30 DIAGNOSIS — F419 Anxiety disorder, unspecified: Secondary | ICD-10-CM | POA: Diagnosis not present

## 2020-09-30 DIAGNOSIS — M810 Age-related osteoporosis without current pathological fracture: Secondary | ICD-10-CM | POA: Diagnosis not present

## 2020-09-30 DIAGNOSIS — I2510 Atherosclerotic heart disease of native coronary artery without angina pectoris: Secondary | ICD-10-CM | POA: Diagnosis not present

## 2020-09-30 DIAGNOSIS — N183 Chronic kidney disease, stage 3 unspecified: Secondary | ICD-10-CM | POA: Diagnosis not present

## 2020-10-01 DIAGNOSIS — F329 Major depressive disorder, single episode, unspecified: Secondary | ICD-10-CM | POA: Diagnosis not present

## 2020-10-01 DIAGNOSIS — G8929 Other chronic pain: Secondary | ICD-10-CM | POA: Diagnosis not present

## 2020-10-01 DIAGNOSIS — I1310 Hypertensive heart and chronic kidney disease without heart failure, with stage 1 through stage 4 chronic kidney disease, or unspecified chronic kidney disease: Secondary | ICD-10-CM | POA: Diagnosis not present

## 2020-10-01 DIAGNOSIS — M545 Low back pain, unspecified: Secondary | ICD-10-CM | POA: Diagnosis not present

## 2020-10-01 DIAGNOSIS — N183 Chronic kidney disease, stage 3 unspecified: Secondary | ICD-10-CM | POA: Diagnosis not present

## 2020-10-01 DIAGNOSIS — M810 Age-related osteoporosis without current pathological fracture: Secondary | ICD-10-CM | POA: Diagnosis not present

## 2020-10-01 DIAGNOSIS — I2510 Atherosclerotic heart disease of native coronary artery without angina pectoris: Secondary | ICD-10-CM | POA: Diagnosis not present

## 2020-10-01 DIAGNOSIS — F419 Anxiety disorder, unspecified: Secondary | ICD-10-CM | POA: Diagnosis not present

## 2020-10-01 DIAGNOSIS — H903 Sensorineural hearing loss, bilateral: Secondary | ICD-10-CM | POA: Diagnosis not present

## 2020-10-02 DIAGNOSIS — N183 Chronic kidney disease, stage 3 unspecified: Secondary | ICD-10-CM | POA: Diagnosis not present

## 2020-10-02 DIAGNOSIS — M810 Age-related osteoporosis without current pathological fracture: Secondary | ICD-10-CM | POA: Diagnosis not present

## 2020-10-02 DIAGNOSIS — G8929 Other chronic pain: Secondary | ICD-10-CM | POA: Diagnosis not present

## 2020-10-02 DIAGNOSIS — F419 Anxiety disorder, unspecified: Secondary | ICD-10-CM | POA: Diagnosis not present

## 2020-10-02 DIAGNOSIS — M5136 Other intervertebral disc degeneration, lumbar region: Secondary | ICD-10-CM | POA: Insufficient documentation

## 2020-10-02 DIAGNOSIS — I2510 Atherosclerotic heart disease of native coronary artery without angina pectoris: Secondary | ICD-10-CM | POA: Diagnosis not present

## 2020-10-02 DIAGNOSIS — F329 Major depressive disorder, single episode, unspecified: Secondary | ICD-10-CM | POA: Diagnosis not present

## 2020-10-02 DIAGNOSIS — M545 Low back pain, unspecified: Secondary | ICD-10-CM | POA: Diagnosis not present

## 2020-10-02 DIAGNOSIS — I1310 Hypertensive heart and chronic kidney disease without heart failure, with stage 1 through stage 4 chronic kidney disease, or unspecified chronic kidney disease: Secondary | ICD-10-CM | POA: Diagnosis not present

## 2020-10-02 DIAGNOSIS — H903 Sensorineural hearing loss, bilateral: Secondary | ICD-10-CM | POA: Diagnosis not present

## 2020-10-09 DIAGNOSIS — M810 Age-related osteoporosis without current pathological fracture: Secondary | ICD-10-CM | POA: Diagnosis not present

## 2020-10-09 DIAGNOSIS — I1310 Hypertensive heart and chronic kidney disease without heart failure, with stage 1 through stage 4 chronic kidney disease, or unspecified chronic kidney disease: Secondary | ICD-10-CM | POA: Diagnosis not present

## 2020-10-09 DIAGNOSIS — N183 Chronic kidney disease, stage 3 unspecified: Secondary | ICD-10-CM | POA: Diagnosis not present

## 2020-10-09 DIAGNOSIS — G8929 Other chronic pain: Secondary | ICD-10-CM | POA: Diagnosis not present

## 2020-10-09 DIAGNOSIS — F329 Major depressive disorder, single episode, unspecified: Secondary | ICD-10-CM | POA: Diagnosis not present

## 2020-10-09 DIAGNOSIS — F419 Anxiety disorder, unspecified: Secondary | ICD-10-CM | POA: Diagnosis not present

## 2020-10-09 DIAGNOSIS — I2510 Atherosclerotic heart disease of native coronary artery without angina pectoris: Secondary | ICD-10-CM | POA: Diagnosis not present

## 2020-10-09 DIAGNOSIS — H903 Sensorineural hearing loss, bilateral: Secondary | ICD-10-CM | POA: Diagnosis not present

## 2020-10-09 DIAGNOSIS — M545 Low back pain, unspecified: Secondary | ICD-10-CM | POA: Diagnosis not present

## 2020-10-16 DIAGNOSIS — H903 Sensorineural hearing loss, bilateral: Secondary | ICD-10-CM | POA: Diagnosis not present

## 2020-10-16 DIAGNOSIS — I2510 Atherosclerotic heart disease of native coronary artery without angina pectoris: Secondary | ICD-10-CM | POA: Diagnosis not present

## 2020-10-16 DIAGNOSIS — F419 Anxiety disorder, unspecified: Secondary | ICD-10-CM | POA: Diagnosis not present

## 2020-10-16 DIAGNOSIS — N183 Chronic kidney disease, stage 3 unspecified: Secondary | ICD-10-CM | POA: Diagnosis not present

## 2020-10-16 DIAGNOSIS — M810 Age-related osteoporosis without current pathological fracture: Secondary | ICD-10-CM | POA: Diagnosis not present

## 2020-10-16 DIAGNOSIS — G8929 Other chronic pain: Secondary | ICD-10-CM | POA: Diagnosis not present

## 2020-10-16 DIAGNOSIS — M545 Low back pain, unspecified: Secondary | ICD-10-CM | POA: Diagnosis not present

## 2020-10-16 DIAGNOSIS — I1310 Hypertensive heart and chronic kidney disease without heart failure, with stage 1 through stage 4 chronic kidney disease, or unspecified chronic kidney disease: Secondary | ICD-10-CM | POA: Diagnosis not present

## 2020-10-16 DIAGNOSIS — F329 Major depressive disorder, single episode, unspecified: Secondary | ICD-10-CM | POA: Diagnosis not present

## 2020-10-23 DIAGNOSIS — F419 Anxiety disorder, unspecified: Secondary | ICD-10-CM | POA: Diagnosis not present

## 2020-10-23 DIAGNOSIS — F329 Major depressive disorder, single episode, unspecified: Secondary | ICD-10-CM | POA: Diagnosis not present

## 2020-10-23 DIAGNOSIS — M810 Age-related osteoporosis without current pathological fracture: Secondary | ICD-10-CM | POA: Diagnosis not present

## 2020-10-23 DIAGNOSIS — I2510 Atherosclerotic heart disease of native coronary artery without angina pectoris: Secondary | ICD-10-CM | POA: Diagnosis not present

## 2020-10-23 DIAGNOSIS — G8929 Other chronic pain: Secondary | ICD-10-CM | POA: Diagnosis not present

## 2020-10-23 DIAGNOSIS — I1310 Hypertensive heart and chronic kidney disease without heart failure, with stage 1 through stage 4 chronic kidney disease, or unspecified chronic kidney disease: Secondary | ICD-10-CM | POA: Diagnosis not present

## 2020-10-23 DIAGNOSIS — H903 Sensorineural hearing loss, bilateral: Secondary | ICD-10-CM | POA: Diagnosis not present

## 2020-10-23 DIAGNOSIS — N183 Chronic kidney disease, stage 3 unspecified: Secondary | ICD-10-CM | POA: Diagnosis not present

## 2020-10-23 DIAGNOSIS — M545 Low back pain, unspecified: Secondary | ICD-10-CM | POA: Diagnosis not present

## 2020-10-25 DIAGNOSIS — I1310 Hypertensive heart and chronic kidney disease without heart failure, with stage 1 through stage 4 chronic kidney disease, or unspecified chronic kidney disease: Secondary | ICD-10-CM | POA: Diagnosis not present

## 2020-10-25 DIAGNOSIS — M545 Low back pain, unspecified: Secondary | ICD-10-CM | POA: Diagnosis not present

## 2020-10-25 DIAGNOSIS — I2510 Atherosclerotic heart disease of native coronary artery without angina pectoris: Secondary | ICD-10-CM | POA: Diagnosis not present

## 2020-10-25 DIAGNOSIS — F419 Anxiety disorder, unspecified: Secondary | ICD-10-CM | POA: Diagnosis not present

## 2020-10-25 DIAGNOSIS — M810 Age-related osteoporosis without current pathological fracture: Secondary | ICD-10-CM | POA: Diagnosis not present

## 2020-10-25 DIAGNOSIS — G8929 Other chronic pain: Secondary | ICD-10-CM | POA: Diagnosis not present

## 2020-10-25 DIAGNOSIS — N183 Chronic kidney disease, stage 3 unspecified: Secondary | ICD-10-CM | POA: Diagnosis not present

## 2020-10-25 DIAGNOSIS — F329 Major depressive disorder, single episode, unspecified: Secondary | ICD-10-CM | POA: Diagnosis not present

## 2020-10-25 DIAGNOSIS — H903 Sensorineural hearing loss, bilateral: Secondary | ICD-10-CM | POA: Diagnosis not present

## 2020-10-29 DIAGNOSIS — M810 Age-related osteoporosis without current pathological fracture: Secondary | ICD-10-CM | POA: Diagnosis not present

## 2020-10-29 DIAGNOSIS — M545 Low back pain, unspecified: Secondary | ICD-10-CM | POA: Diagnosis not present

## 2020-10-29 DIAGNOSIS — G8929 Other chronic pain: Secondary | ICD-10-CM | POA: Diagnosis not present

## 2020-10-29 DIAGNOSIS — F329 Major depressive disorder, single episode, unspecified: Secondary | ICD-10-CM | POA: Diagnosis not present

## 2020-10-29 DIAGNOSIS — F419 Anxiety disorder, unspecified: Secondary | ICD-10-CM | POA: Diagnosis not present

## 2020-10-29 DIAGNOSIS — I1310 Hypertensive heart and chronic kidney disease without heart failure, with stage 1 through stage 4 chronic kidney disease, or unspecified chronic kidney disease: Secondary | ICD-10-CM | POA: Diagnosis not present

## 2020-10-29 DIAGNOSIS — H903 Sensorineural hearing loss, bilateral: Secondary | ICD-10-CM | POA: Diagnosis not present

## 2020-10-29 DIAGNOSIS — N183 Chronic kidney disease, stage 3 unspecified: Secondary | ICD-10-CM | POA: Diagnosis not present

## 2020-10-29 DIAGNOSIS — I2510 Atherosclerotic heart disease of native coronary artery without angina pectoris: Secondary | ICD-10-CM | POA: Diagnosis not present

## 2020-11-04 ENCOUNTER — Ambulatory Visit: Payer: Medicare PPO | Admitting: Podiatry

## 2020-11-04 ENCOUNTER — Other Ambulatory Visit: Payer: Self-pay

## 2020-11-04 ENCOUNTER — Encounter: Payer: Self-pay | Admitting: Podiatry

## 2020-11-04 DIAGNOSIS — M79674 Pain in right toe(s): Secondary | ICD-10-CM | POA: Diagnosis not present

## 2020-11-04 DIAGNOSIS — N183 Chronic kidney disease, stage 3 unspecified: Secondary | ICD-10-CM

## 2020-11-04 DIAGNOSIS — R600 Localized edema: Secondary | ICD-10-CM | POA: Diagnosis not present

## 2020-11-04 DIAGNOSIS — L84 Corns and callosities: Secondary | ICD-10-CM | POA: Diagnosis not present

## 2020-11-04 DIAGNOSIS — M79675 Pain in left toe(s): Secondary | ICD-10-CM | POA: Diagnosis not present

## 2020-11-04 DIAGNOSIS — B351 Tinea unguium: Secondary | ICD-10-CM | POA: Diagnosis not present

## 2020-11-04 DIAGNOSIS — G629 Polyneuropathy, unspecified: Secondary | ICD-10-CM | POA: Diagnosis not present

## 2020-11-04 NOTE — Patient Instructions (Signed)
Edema Edema is an abnormal buildup of fluids in the body tissues and under the skin. Swelling of the legs, feet, and ankles is a common symptom that becomes more likely as you get older. Swelling is also common in looser tissues, like around the eyes. When the affected area is squeezed, the fluid may move out of that spot and leave a dent for a few moments. This dent is called pitting edema. There are many possible causes of edema. Eating too much salt (sodium) and being on your feet or sitting for a long time can cause edema in your legs, feet, and ankles. Hot weather may make edema worse. Common causes of edema include: Heart failure. Liver or kidney disease. Weak leg blood vessels. Cancer. An injury. Pregnancy. Medicines. Being obese. Low protein levels in the blood. Edema is usually painless. Your skin may look swollen or shiny. Follow these instructions at home: Keep the affected body part raised (elevated) above the level of your heart when you are sitting or lying down. Do not sit still or stand for long periods of time. Do not wear tight clothing. Do not wear garters on your upper legs. Exercise your legs to get your circulation going. This helps to move the fluid back into your blood vessels, and it may help the swelling go down. Wear elastic bandages or support stockings to reduce swelling as told by your health care provider. Eat a low-salt (low-sodium) diet to reduce fluid as told by your health care provider. Depending on the cause of your swelling, you may need to limit how much fluid you drink (fluid restriction). Take over-the-counter and prescription medicines only as told by your health care provider. Contact a health care provider if: Your edema does not get better with treatment. You have heart, liver, or kidney disease and have symptoms of edema. You have sudden and unexplained weight gain. Get help right away if: You develop shortness of breath or chest pain. You  cannot breathe when you lie down. You develop pain, redness, or warmth in the swollen areas. You have heart, liver, or kidney disease and suddenly get edema. You have a fever and your symptoms suddenly get worse. Summary Edema is an abnormal buildup of fluids in the body tissues and under the skin. Eating too much salt (sodium) and being on your feet or sitting for a long time can cause edema in your legs, feet, and ankles. Keep the affected body part raised (elevated) above the level of your heart when you are sitting or lying down. This information is not intended to replace advice given to you by your health care provider. Make sure you discuss any questions you have with your health care provider. Document Revised: 11/07/2019 Document Reviewed: 10/24/2019 Elsevier Patient Education  2022 [place] A bunion (hallux valgus) is a bump that forms slowly on the inner side of the big toe joint. It occurs when the big toe turns toward the second toe. Bunions may be small at first, but they often get larger over time. They can make walking painful. What are the causes? This condition may be caused by: Wearing narrow or pointed shoes that force the big toe to press against the other toes. Abnormal foot development that causes the foot to roll inward. Changes in the foot that are caused by certain diseases, such as rheumatoid arthritis or polio. A foot injury. What increases the risk? The following factors may make you more likely to develop this condition: Wearing  shoes that squeeze the toes together. Having certain diseases, such as: Rheumatoid arthritis. Polio. Cerebral palsy. Having family members who have bunions. Being born with abnormally shaped feet (a foot deformity), such as flat feet or low arches. Doing activities that put a lot of pressure on the feet, such as ballet dancing. What are the signs or symptoms? The main symptom of this condition is a bump on your big  toe that you can notice. Other symptoms may include: Pain. Redness and inflammation around your big toe. Thick or hardened skin on your big toe or between your toes. Stiffness or loss of motion in your big toe. Trouble with walking. How is this diagnosed? This condition may be diagnosed based on your symptoms, medical history, and activities. You may also have tests and imaging, such as: X-rays. These allow your health care provider to check the position of the bones in your foot and look for damage to your joint. They also help your health care provider determine the severity of your bunion and the best way to treat it. Joint aspiration. In this test, a sample of fluid is removed from the toe joint. This test may be done if you are in a lot of pain. It helps rule out diseases that cause painful swelling of the joints, such as arthritis or gout. How is this treated? Treatment depends on the severity of your symptoms. The goal of treatment is to relieve symptoms and prevent your bunion from getting worse. Your health care provider may recommend: Wearing shoes that have a wide toe box, or using bunion pads to cushion the affected area. Taping your toes together to keep them in a normal position. Placing a device inside your shoe (orthotic device) to help reduce pressure on your toe joint. Taking medicine to ease pain and inflammation. Putting ice or heat on the affected area. Doing stretching exercises. Surgery, for severe cases. Follow these instructions at home: Managing pain, stiffness, and swelling   If directed, put ice on the painful area. To do this: Put ice in a plastic bag. Place a towel between your skin and the bag. Leave the ice on for 20 minutes, 2-3 times a day. Remove the ice if your skin turns bright red. This is very important. If you cannot feel pain, heat, or cold, you have a greater risk of damage to the area. If directed, apply heat to the affected area before you  exercise. Use the heat source that your health care provider recommends, such as a moist heat pack or a heating pad. Place a towel between your skin and the heat source. Leave the heat on for 20-30 minutes. Remove the heat if your skin turns bright red. This is especially important if you are unable to feel pain, heat, or cold. You have a greater risk of getting burned. General instructions Do exercises as told by your health care provider. Support your toe joint with proper footwear, shoe padding, or taping as told by your health care provider. Take over-the-counter and prescription medicines only as told by your health care provider. Do not use any products that contain nicotine or tobacco, such as cigarettes, e-cigarettes, and chewing tobacco. If you need help quitting, ask your health care provider. Keep all follow-up visits. This is important. Contact a health care provider if: Your symptoms get worse. Your symptoms do not improve in 2 weeks. Get help right away if: You have severe pain and trouble with walking. Summary A bunion is a bump  on the inner side of the big toe joint that forms when the big toe turns toward the second toe. Bunions can make walking painful. Treatment depends on the severity of your symptoms. Support your toe joint with proper footwear, shoe padding, or taping as told by your health care provider. This information is not intended to replace advice given to you by your health care provider. Make sure you discuss any questions you have with your health care provider. Document Revised: 05/05/2019 Document Reviewed: 05/05/2019 Elsevier Patient Education  2022 [place] Hammer toe is a change in the shape, or a deformity, of the toe. The deformity causes the middle joint of the toe to stay bent. Hammer toe starts gradually. At first, the toe can be straightened. Then over time, the toe deformity becomes stiff, inflexible, and permanently bent. Hammer  toe usually affects the second, third, or fourth toe. A hammer toe causes pain, especially when wearing shoes. Corns and calluses can result from the toe rubbing against the inside of the shoe. Early treatments to keep the toe straight may relieve pain. As the deformity of the toe becomes stiff and permanent, surgery may be needed to straighten the toe. What are the causes? This condition is caused by abnormal bending of the toe joint that is closest to your foot. Over time, the toe bending downward pulls on the muscles and connections (tendons) of the toe joint, making them weak and stiff. Wearing shoes that are too narrow in the toe box and do not allow toes to fully straighten can cause this condition. What increases the risk? You are more likely to develop this condition if you: Are an older female. Wear shoes that are too small, or wear high-heeled shoes that pinch your toes. Have a second toe that is longer than your big toe (first toe). Injure your foot or toe. Have arthritis, or have a nerve or muscle disorder. Have diabetes or a condition known as Charcot joint, which may cause you to walk abnormally. Have a family history of hammer toe. Are a Engineer, mining. What are the signs or symptoms? Pain and deformity of the toe are the main symptoms of this condition. The pain is worse when wearing shoes, walking, or running. Other symptoms may include: A thickened patch of skin, called a corn or callus, that forms over the top of the bent part of the toe or between the toes. Redness and a burning feeling on the bent toe. An open sore that forms on the top of the bent toe. Not being able to straighten the affected toe. How is this diagnosed? This condition is diagnosed based on your symptoms and a physical exam. During the exam, your health care provider will try to straighten your toe to see how stiff the deformity is. You may also have tests, such as: A blood test to check for rheumatoid  arthritis or diabetes. An X-ray to show how severe the toe deformity is. How is this treated? Treatment for this condition depends on whether the toe is flexible or deformed and no longer moveable. In less severe cases, a hammer toe can be straightened without surgery. These treatments include: Taping the toe into a straightened position. Using pads and cushions to protect the bent toe. Wearing shoes that provide enough room for the toes. Doing toe-stretching exercises at home. Taking an NSAID, such as ibuprofen, to reduce pain and swelling. Using special orthotics or insoles for pain relief and to improve walking.  If these treatments do not help or the toe has a severe deformity and cannot be straightened, surgery is the next option. The most common surgeries used to straighten a hammer toe include: Arthroplasty or osteotomy. Part of the toe joint is reconstructed or removed, which allows the toe to straighten. Fusion. Cartilage between the two bones of the joint is taken out, and the bones are fused together into one longer bone. Implantation. Part of the bone is removed and replaced with an implant to allow the toe to move again. Flexor tendon transfer. The tendons that curl the toes down (flexor tendons) are repositioned. Follow these instructions at home: Take over-the-counter and prescription medicines only as told by your health care provider. Do toe-straightening and stretching exercises as told by your health care provider. Keep all follow-up visits. This is important. How is this prevented? Wear shoes that fit properly and give your toes enough room. Shoes should not cause pain. Buy shoes at the end of the day to make sure they fit well, since your foot may swell during the day. Make sure they are comfortable before you buy them. As you age, your shoe size might change, including the width. Measure both feet and buy shoes for the larger foot. A shoe repair store might be able to  stretch shoes that feel tight in spots. Do not wear high-heeled shoes or shoes with pointed toes. Contact a health care provider if: Your pain gets worse. Your toe becomes red or swollen. You develop an open sore on your toe. Summary Hammer toe is a condition that gradually causes your toe to become bent and stiff. Hammer toe can be treated by taping the toe into a straightened position and doing toe-stretching exercises. If these treatments do not help, surgery may be needed. To prevent this condition, wear shoes that fit properly, give your toes enough room, and do not cause pain. This information is not intended to replace advice given to you by your health care provider. Make sure you discuss any questions you have with your health care provider. Document Revised: 04/06/2019 Document Reviewed: 04/06/2019 Elsevier Patient Education  2022 [employer].

## 2020-11-07 DIAGNOSIS — L821 Other seborrheic keratosis: Secondary | ICD-10-CM | POA: Diagnosis not present

## 2020-11-07 DIAGNOSIS — L578 Other skin changes due to chronic exposure to nonionizing radiation: Secondary | ICD-10-CM | POA: Diagnosis not present

## 2020-11-07 DIAGNOSIS — D485 Neoplasm of uncertain behavior of skin: Secondary | ICD-10-CM | POA: Diagnosis not present

## 2020-11-07 DIAGNOSIS — D239 Other benign neoplasm of skin, unspecified: Secondary | ICD-10-CM | POA: Diagnosis not present

## 2020-11-07 DIAGNOSIS — L820 Inflamed seborrheic keratosis: Secondary | ICD-10-CM | POA: Diagnosis not present

## 2020-11-10 NOTE — Progress Notes (Signed)
Subjective: Carmen Haney presents today referred by Carmen Pillar, MD for complaint of painful thick toenails that are difficult to trim. Pain interferes with ambulation. Aggravating factors include wearing enclosed shoe gear. Pain is relieved with periodic professional debridement.  She has questions regarding discoloration of her feet/toes which are "purple".  Her daughter, Carmen Haney, is present during today's visit.  Past Medical History:  Diagnosis Date   ACE-inhibitor cough    Breast CA [facility])    s/p lumpectomy & radiation   Breast cancer ([place]) 2010   Right Breast Cancer   CAD (coronary artery disease) 07/1998   s/p cath in 2000. L main with 30 to 40%   Hearing loss    HTN (hypertension)    Hyperlipidemia    Obesities, morbid ([place])    Osteoporosis    Personal history of radiation therapy 2010   Right Breast Cancer   Thyroid disease    Vertebral fracture, pathological      Patient Active Problem List   Diagnosis Date Noted   Degeneration of lumbar intervertebral disc 10/02/2020   Low back pain 10/02/2020   Personal history of colonic polyps 08/21/2020   Other specified symptoms and signs involving the circulatory and respiratory systems 08/21/2020   Other abnormalities of gait and mobility 08/21/2020   Major depression 08/21/2020   Chronic kidney disease (CKD), stage III (moderate) (HCC) 08/21/2020   Age-related osteoporosis without current pathological fracture 08/21/2020   Abnormal digestive tract function 08/21/2020   Chronic pain of right knee 08/15/2019   Vertebral fracture, pathological    Thyroid disease    Personal history of radiation therapy    Breast CA ([place])    Genetic testing 10/22/2016   Sensorineural hearing loss (SNHL), bilateral 07/19/2015   Anxiety disorder 07/19/2015   Sclerosing adenosis of left breast 11/15/2014   Bruit 08/03/2013   Family history of malignant neoplasm of breast 07/28/2013   Neoplasm of right breast, primary tumor staging  category Tis: ductal carcinoma in situ (DCIS) 10/20/2012   Obesities, morbid (HCC)    HTN (hypertension)    Hyperlipidemia    CAD (coronary artery disease) 07/13/1998     Past Surgical History:  Procedure Laterality Date   BREAST BIOPSY Left    BREAST LUMPECTOMY Right March 2010   BREAST LUMPECTOMY WITH RADIOACTIVE SEED LOCALIZATION Left 09/20/2014   Procedure: LEFT BREAST LUMPECTOMY WITH RADIOACTIVE SEED LOCALIZATION;  Surgeon: Autumn Messing III, MD;  Location: [place];  Service: General;  Laterality: Left;   CARPAL TUNNEL RELEASE Right    CESAREAN SECTION     EYE SURGERY     Right Eye   Skin Cancer Removed from Right Forearm     TOTAL ABDOMINAL HYSTERECTOMY       Current Outpatient Medications on File Prior to Visit  Medication Sig Dispense Refill   aspirin 81 MG tablet Take 81 mg by mouth daily.       Calcium Carbonate-Vitamin D (CALCIUM + D PO) Take by mouth 2 (two) times daily.       furosemide (LASIX) 20 MG tablet TAKE ONE TABLET BY MOUTH DAILY 90 tablet 3   Levothyroxine Sodium 100 MCG CAPS Take 100 mcg by mouth daily before breakfast.     LORazepam (ATIVAN) 0.5 MG tablet Take 0.25 mg by mouth every 8 (eight) hours.     losartan (COZAAR) 50 MG tablet TAKE ONE TABLET BY MOUTH DAILY 90 tablet 3   sertraline (ZOLOFT) 100 MG tablet Take 200 mg by mouth  daily.      simvastatin (ZOCOR) 10 MG tablet Take 10 mg by mouth daily at 6 PM.      [DISCONTINUED] olmesartan (BENICAR) 20 MG tablet Take 20 mg by mouth daily.       No current facility-administered medications on file prior to visit.     Allergies  Allergen Reactions   Ace Inhibitors Cough   Latex Rash     Social History   Occupational History   Occupation: Retired from [employer] RETIRED  Tobacco Use   Smoking status: Former    Types: Cigarettes    Quit date: 01/13/1963    Years since quitting: 57.8   Smokeless tobacco: Never   Tobacco comments:    stopped in 1970's  Vaping Use   Vaping  Use: Never used  Substance and Sexual Activity   Alcohol use: No    Alcohol/week: 0.0 standard drinks   Drug use: No   Sexual activity: Not on file     Family History  Problem Relation Age of Onset   Coronary artery disease Mother    Hypertension Mother    Melanoma Mother    Cancer Mother 35       breast   Breast cancer Mother    Cancer Father 51       prostate   Cancer Sister 11       breast   Breast cancer Sister    Cancer Maternal Aunt 75       breast   Cancer Sister 7       breast   Breast cancer Sister    Breast cancer Daughter 60     Immunization History  Administered Date(s) Administered   Influenza Split 10/25/2009, 10/22/2010, 10/19/2011   Influenza,inj,Quad PF,6+ Mos 10/20/2012, 11/28/2013, 11/15/2014   Influenza,inj,quad, With Preservative 12/04/2015, 11/05/2016, 10/29/2017, 11/19/2018, 10/21/2019   Influenza-Unspecified 10/29/2017   PFIZER(Purple Top)SARS-COV-2 Vaccination 01/29/2019, 02/18/2019, 10/28/2019   Pneumococcal Conjugate-13 04/11/2013   Pneumococcal Polysaccharide-23 07/04/1997   Td 01/12/2002   Tdap 08/29/2010   Zoster, Live 03/01/2012     Objective: Carmen Haney is a pleasant 87 y.o. female WD, WN in NAD. AAO x 3.  There were no vitals filed for this visit.  Vascular Examination:  CFT <3 seconds b/l LE. Faintly palpable DP pulse(s) b/l lower extremities. Faintly palpable PT pulse(s) b/l lower extremities. Pedal hair absent. Lower extremity skin temperature gradient within normal limits. No pain with calf compression RLE. Trace edema noted bilateral ankles. Varicosities present b/l.  Dermatological Examination: Pedal skin thin, shiny and atrophic b/l LE. No open wounds b/l LE. No interdigital macerations submet head 1 b/l. Toenails 1-5 b/l elongated, discolored, dystrophic, thickened, crumbly with subungual debris and tenderness to dorsal palpation. Hyperkeratotic lesion(s) submet head 1 b/l.  No erythema, no edema, no drainage, no  fluctuance.  Musculoskeletal: Normal muscle strength 5/5 to all lower extremity muscle groups bilaterally. Hallux hammertoe noted b/l LE. Hammertoe(s) noted to the L 2nd toe and R 2nd toe.  Neurological: Pt has subjective symptoms of neuropathy. Protective sensation intact 5/5 intact bilaterally with 10g monofilament b/l. Vibratory sensation diminished b/l. Proprioception intact bilaterally.  Assessment: 1. Pain due to onychomycosis of toenails of both feet   2. Callus   3. Localized edema   4. Neuropathy   5. Stage 3 chronic kidney disease, unspecified whether stage 3a or 3b CKD ([place])     Plan: -Examined patient. -We discussed spider veins, venous insufficiency and edema. -We discussed bunion  deformity wide, supportive shoe gear was recommended. -Discussed topical, laser and oral medication for onychomycosis. Patient opted for toenail debridement only on today.  -Toenails 1-5 b/l were debrided in length and girth with sterile nail nippers and dremel without iatrogenic bleeding.  -Callus(es) submet head 1 b/l pared utilizing sterile scalpel blade without complication or incident. Total number debrided =2. -Patient/POA to call should there be question/concern in the interim.  Return in about 3 months (around 02/04/2021).  Marzetta Board, DPM

## 2020-11-25 ENCOUNTER — Other Ambulatory Visit: Payer: Self-pay

## 2020-11-25 ENCOUNTER — Ambulatory Visit: Payer: Medicare PPO | Attending: Internal Medicine

## 2020-11-25 ENCOUNTER — Other Ambulatory Visit (HOSPITAL_BASED_OUTPATIENT_CLINIC_OR_DEPARTMENT_OTHER): Payer: Self-pay

## 2020-11-25 DIAGNOSIS — Z23 Encounter for immunization: Secondary | ICD-10-CM

## 2020-11-25 MED ORDER — PFIZER COVID-19 VAC BIVALENT 30 MCG/0.3ML IM SUSP
INTRAMUSCULAR | 0 refills | Status: DC
  Filled 2020-11-25: qty 0.3, 1d supply, fill #0

## 2020-11-25 NOTE — Progress Notes (Signed)
   Covid-19 Vaccination Clinic  Name:  MAIRLYN TEGTMEYER    MRN: [id number] DOB: 09-Jul-1930  11/25/2020  Ms. Kowalke was observed post Covid-19 immunization for 15 minutes without incident. She was provided with Vaccine Information Sheet and instruction to access the V-Safe system.   Ms. Diedrich was instructed to call 911 with any severe reactions post vaccine: Difficulty breathing  Swelling of face and throat  A fast heartbeat  A bad rash all over body  Dizziness and weakness   Immunizations Administered     Name Date Dose VIS Date Route   Moderna Covid-19 vaccine Bivalent Booster 11/25/2020 11:27 AM 0.5 mL 08/24/2020 Intramuscular   Manufacturer: Moderna   Lot: [id number]   [place]: [phone]

## 2020-12-23 DIAGNOSIS — M25561 Pain in right knee: Secondary | ICD-10-CM | POA: Diagnosis not present

## 2020-12-23 DIAGNOSIS — M25562 Pain in left knee: Secondary | ICD-10-CM | POA: Diagnosis not present

## 2020-12-23 DIAGNOSIS — G8929 Other chronic pain: Secondary | ICD-10-CM | POA: Diagnosis not present

## 2020-12-26 ENCOUNTER — Other Ambulatory Visit: Payer: Self-pay | Admitting: Cardiology

## 2020-12-26 DIAGNOSIS — F411 Generalized anxiety disorder: Secondary | ICD-10-CM | POA: Diagnosis not present

## 2020-12-26 DIAGNOSIS — F331 Major depressive disorder, recurrent, moderate: Secondary | ICD-10-CM | POA: Diagnosis not present

## 2021-01-03 DIAGNOSIS — I2510 Atherosclerotic heart disease of native coronary artery without angina pectoris: Secondary | ICD-10-CM | POA: Diagnosis not present

## 2021-01-03 DIAGNOSIS — E7800 Pure hypercholesterolemia, unspecified: Secondary | ICD-10-CM | POA: Diagnosis not present

## 2021-01-03 DIAGNOSIS — N183 Chronic kidney disease, stage 3 unspecified: Secondary | ICD-10-CM | POA: Diagnosis not present

## 2021-01-03 DIAGNOSIS — E039 Hypothyroidism, unspecified: Secondary | ICD-10-CM | POA: Diagnosis not present

## 2021-01-03 DIAGNOSIS — I129 Hypertensive chronic kidney disease with stage 1 through stage 4 chronic kidney disease, or unspecified chronic kidney disease: Secondary | ICD-10-CM | POA: Diagnosis not present

## 2021-01-03 DIAGNOSIS — F418 Other specified anxiety disorders: Secondary | ICD-10-CM | POA: Diagnosis not present

## 2021-01-03 DIAGNOSIS — F322 Major depressive disorder, single episode, severe without psychotic features: Secondary | ICD-10-CM | POA: Diagnosis not present

## 2021-01-09 DIAGNOSIS — F331 Major depressive disorder, recurrent, moderate: Secondary | ICD-10-CM | POA: Diagnosis not present

## 2021-01-09 DIAGNOSIS — F411 Generalized anxiety disorder: Secondary | ICD-10-CM | POA: Diagnosis not present

## 2021-01-22 DIAGNOSIS — L570 Actinic keratosis: Secondary | ICD-10-CM | POA: Diagnosis not present

## 2021-01-22 DIAGNOSIS — L821 Other seborrheic keratosis: Secondary | ICD-10-CM | POA: Diagnosis not present

## 2021-01-23 DIAGNOSIS — F411 Generalized anxiety disorder: Secondary | ICD-10-CM | POA: Diagnosis not present

## 2021-01-23 DIAGNOSIS — F331 Major depressive disorder, recurrent, moderate: Secondary | ICD-10-CM | POA: Diagnosis not present

## 2021-01-30 DIAGNOSIS — F331 Major depressive disorder, recurrent, moderate: Secondary | ICD-10-CM | POA: Diagnosis not present

## 2021-01-30 DIAGNOSIS — F411 Generalized anxiety disorder: Secondary | ICD-10-CM | POA: Diagnosis not present

## 2021-02-05 DIAGNOSIS — F411 Generalized anxiety disorder: Secondary | ICD-10-CM | POA: Diagnosis not present

## 2021-02-05 DIAGNOSIS — F331 Major depressive disorder, recurrent, moderate: Secondary | ICD-10-CM | POA: Diagnosis not present

## 2021-02-10 DIAGNOSIS — F411 Generalized anxiety disorder: Secondary | ICD-10-CM | POA: Diagnosis not present

## 2021-02-10 DIAGNOSIS — F331 Major depressive disorder, recurrent, moderate: Secondary | ICD-10-CM | POA: Diagnosis not present

## 2021-02-15 ENCOUNTER — Emergency Department (HOSPITAL_COMMUNITY): Payer: Medicare PPO

## 2021-02-15 ENCOUNTER — Other Ambulatory Visit: Payer: Self-pay

## 2021-02-15 ENCOUNTER — Encounter (HOSPITAL_COMMUNITY): Payer: Self-pay | Admitting: *Deleted

## 2021-02-15 ENCOUNTER — Observation Stay (HOSPITAL_COMMUNITY)
Admission: EM | Admit: 2021-02-15 | Discharge: 2021-02-16 | Disposition: A | Discharge status: Home / Self Care | Payer: Medicare PPO | Attending: Internal Medicine | Admitting: Internal Medicine

## 2021-02-15 DIAGNOSIS — Z7982 Long term (current) use of aspirin: Secondary | ICD-10-CM | POA: Diagnosis not present

## 2021-02-15 DIAGNOSIS — I2510 Atherosclerotic heart disease of native coronary artery without angina pectoris: Secondary | ICD-10-CM | POA: Diagnosis present

## 2021-02-15 DIAGNOSIS — N183 Chronic kidney disease, stage 3 unspecified: Secondary | ICD-10-CM | POA: Diagnosis not present

## 2021-02-15 DIAGNOSIS — I4891 Unspecified atrial fibrillation: Secondary | ICD-10-CM | POA: Diagnosis not present

## 2021-02-15 DIAGNOSIS — Z87891 Personal history of nicotine dependence: Secondary | ICD-10-CM | POA: Diagnosis not present

## 2021-02-15 DIAGNOSIS — I480 Paroxysmal atrial fibrillation: Principal | ICD-10-CM | POA: Insufficient documentation

## 2021-02-15 DIAGNOSIS — I129 Hypertensive chronic kidney disease with stage 1 through stage 4 chronic kidney disease, or unspecified chronic kidney disease: Secondary | ICD-10-CM | POA: Insufficient documentation

## 2021-02-15 DIAGNOSIS — Z79899 Other long term (current) drug therapy: Secondary | ICD-10-CM | POA: Diagnosis not present

## 2021-02-15 DIAGNOSIS — R600 Localized edema: Secondary | ICD-10-CM | POA: Diagnosis not present

## 2021-02-15 DIAGNOSIS — Z853 Personal history of malignant neoplasm of breast: Secondary | ICD-10-CM | POA: Diagnosis not present

## 2021-02-15 DIAGNOSIS — R000 Tachycardia, unspecified: Secondary | ICD-10-CM | POA: Diagnosis not present

## 2021-02-15 DIAGNOSIS — E039 Hypothyroidism, unspecified: Secondary | ICD-10-CM | POA: Diagnosis not present

## 2021-02-15 DIAGNOSIS — Z20822 Contact with and (suspected) exposure to covid-19: Secondary | ICD-10-CM | POA: Insufficient documentation

## 2021-02-15 DIAGNOSIS — R2681 Unsteadiness on feet: Secondary | ICD-10-CM | POA: Diagnosis not present

## 2021-02-15 DIAGNOSIS — E785 Hyperlipidemia, unspecified: Secondary | ICD-10-CM | POA: Diagnosis present

## 2021-02-15 DIAGNOSIS — R079 Chest pain, unspecified: Secondary | ICD-10-CM | POA: Diagnosis not present

## 2021-02-15 DIAGNOSIS — R0602 Shortness of breath: Secondary | ICD-10-CM | POA: Insufficient documentation

## 2021-02-15 DIAGNOSIS — I10 Essential (primary) hypertension: Secondary | ICD-10-CM | POA: Diagnosis present

## 2021-02-15 DIAGNOSIS — Z91040 Latex allergy status: Secondary | ICD-10-CM | POA: Insufficient documentation

## 2021-02-15 DIAGNOSIS — R531 Weakness: Secondary | ICD-10-CM | POA: Diagnosis not present

## 2021-02-15 DIAGNOSIS — I499 Cardiac arrhythmia, unspecified: Secondary | ICD-10-CM | POA: Diagnosis not present

## 2021-02-15 DIAGNOSIS — R519 Headache, unspecified: Secondary | ICD-10-CM | POA: Diagnosis not present

## 2021-02-15 LAB — CBC
HCT: 39.8 % (ref 36.0–46.0)
Hemoglobin: 13.4 g/dL (ref 12.0–15.0)
MCH: 31.7 pg (ref 26.0–34.0)
MCHC: 33.7 g/dL (ref 30.0–36.0)
MCV: 94.1 fL (ref 80.0–100.0)
Platelets: 229 10*3/uL (ref 150–400)
RBC: 4.23 MIL/uL (ref 3.87–5.11)
RDW: 14.1 % (ref 11.5–15.5)
WBC: 6.3 10*3/uL (ref 4.0–10.5)
nRBC: 0 % (ref 0.0–0.2)

## 2021-02-15 LAB — TROPONIN I (HIGH SENSITIVITY): Troponin I (High Sensitivity): 9 ng/L (ref <18)

## 2021-02-15 LAB — TSH: TSH: 1.371 u[IU]/mL (ref 0.350–4.500)

## 2021-02-15 LAB — BASIC METABOLIC PANEL
Anion gap: 11 (ref 5–15)
BUN: 14 mg/dL (ref 8–23)
CO2: 27 mmol/L (ref 22–32)
Calcium: 8.4 mg/dL — ABNORMAL LOW (ref 8.9–10.3)
Chloride: 98 mmol/L (ref 98–111)
Creatinine, Ser: 1.02 mg/dL — ABNORMAL HIGH (ref 0.44–1.00)
GFR, Estimated: 53 mL/min — ABNORMAL LOW (ref >60)
Glucose, Bld: 118 mg/dL — ABNORMAL HIGH (ref 70–99)
Potassium: 4.1 mmol/L (ref 3.5–5.1)
Sodium: 136 mmol/L (ref 135–145)

## 2021-02-15 LAB — MAGNESIUM: Magnesium: 2.1 mg/dL (ref 1.7–2.4)

## 2021-02-15 LAB — BRAIN NATRIURETIC PEPTIDE: B Natriuretic Peptide: 111.5 pg/mL — ABNORMAL HIGH (ref 0.0–100.0)

## 2021-02-15 LAB — CBG MONITORING, ED: Glucose-Capillary: 117 mg/dL — ABNORMAL HIGH (ref 70–99)

## 2021-02-15 MED ORDER — HEPARIN BOLUS VIA INFUSION
3000.0000 [IU] | Freq: Once | INTRAVENOUS | Status: DC
  Filled 2021-02-15: qty 3000

## 2021-02-15 MED ORDER — DILTIAZEM HCL-DEXTROSE 125-5 MG/125ML-% IV SOLN (PREMIX)
5.0000 mg/h | INTRAVENOUS | Status: DC
  Filled 2021-02-15: qty 125

## 2021-02-15 MED ORDER — HEPARIN (PORCINE) 25000 UT/250ML-% IV SOLN
1000.0000 [IU]/h | INTRAVENOUS | Status: DC
  Filled 2021-02-15: qty 250

## 2021-02-15 MED ORDER — DILTIAZEM LOAD VIA INFUSION
20.0000 mg | Freq: Once | INTRAVENOUS | Status: DC
  Filled 2021-02-15: qty 20

## 2021-02-15 NOTE — Progress Notes (Addendum)
ANTICOAGULATION CONSULT NOTE - Follow Up Consult  Pharmacy Consult for apixaban Indication: atrial fibrillation  Allergies  Allergen Reactions   Ace Inhibitors Cough   Latex Rash    Patient Measurements: Height: 5' 0.25" (153 cm) Weight: 88 kg (194 lb 0.1 oz) IBW/kg (Calculated) : 46.08  Vital Signs: BP: 156/100 (02/04 2224) Pulse Rate: 44 (02/04 2223)  Assessment: 88 year old female to begin Eliquis for new Afib.  Plan:  Eliquis 5mg  PO BID.   Wynona Neat, PharmD, BCPS  02/15/2021,10:47 PM

## 2021-02-15 NOTE — ED Triage Notes (Signed)
Ems  gave 10mg  of cardizem iv on the way here

## 2021-02-15 NOTE — ED Triage Notes (Signed)
The pt arrived by gems from home   the daughter who lives with her and is here now.  The daughter reports that the pt appeared to be having a pan ic attack and when it did not get better  she called  ems.  On arrival alert  minor pain

## 2021-02-15 NOTE — ED Provider Notes (Signed)
[facility] EMERGENCY DEPARTMENT Provider Note   CSN: [id number] Arrival date & time: 02/15/21  2221     History  Chief Complaint  Patient presents with   Chest Pain    Carmen Haney is a 88 y.o. female.  HPI  88 year old female with a history of breast cancer, CAD, hypertension, hyperlipidemia, thyroid disease, who presents to the emergency department today for evaluation of chest tightness that started about 30 minutes to an hour prior to arrival.  Pain radiated up to her jaw into the back of her neck.  She had associated shortness of breath as well.  Denies diaphoresis or nausea.  At this time she is not describing persistent chest tightness or shortness of breath but just states that she feels anxious.  She notes for the last month she has had issues with bilateral lower extremity edema and has been intermittently on Lasix for this.  She has no known history of A. fib.  Has been in her normal state of health recently with no recent fevers, cough, vomiting diarrhea or other infectious symptoms.  Per EMS, pt found to be in Afib with RVR. She was given 10 of cardizem en route. She also took 324mg  ASA.   Home Medications Prior to Admission medications   Medication Sig Start Date End Date Taking? Authorizing Provider  aspirin 81 MG tablet Take 81 mg by mouth daily.      [provider]  Calcium Carbonate-Vitamin D (CALCIUM + D PO) Take by mouth 2 (two) times daily.      [provider]  COVID-19 mRNA bivalent vaccine, Pfizer, (PFIZER COVID-19 VAC BIVALENT) injection Inject into the muscle. 11/25/20   Carlyle Basques, MD  furosemide (LASIX) 20 MG tablet TAKE ONE TABLET BY MOUTH DAILY 12/26/20   Lelon Perla, MD  Levothyroxine Sodium 100 MCG CAPS Take 100 mcg by mouth daily before breakfast.    [provider]  LORazepam (ATIVAN) 0.5 MG tablet Take 0.25 mg by mouth every 8 (eight) hours.    [provider]  losartan (COZAAR) 50  MG tablet TAKE ONE TABLET BY MOUTH DAILY 03/25/20   Lelon Perla, MD  sertraline (ZOLOFT) 100 MG tablet Take 200 mg by mouth daily.  10/29/15   [provider]  simvastatin (ZOCOR) 10 MG tablet Take 10 mg by mouth daily at 6 PM.     [provider]  olmesartan (BENICAR) 20 MG tablet Take 20 mg by mouth daily.    04/25/10  [provider]      Allergies    Ace inhibitors and Latex    Review of Systems   Review of Systems See HPI for pertinent positives or negatives.   Physical Exam Updated Vital Signs BP (!) 156/100    Pulse (!) 44    Resp (!) 21    Ht 5' 0.25" (1.53 m)    Wt 88 kg    SpO2 98%    BMI 37.58 kg/m  Physical Exam Vitals and nursing note reviewed.  Constitutional:      General: She is not in acute distress.    Appearance: She is well-developed.  HENT:     Head: Normocephalic and atraumatic.  Eyes:     Conjunctiva/sclera: Conjunctivae normal.  Cardiovascular:     Rate and Rhythm: Tachycardia present.     Heart sounds: Normal heart sounds. No murmur heard.    Comments: Irregularly irregular rhythm Pulmonary:     Effort: Pulmonary effort is  normal. No respiratory distress.     Breath sounds: Normal breath sounds. No decreased breath sounds, wheezing, rhonchi or rales.  Abdominal:     Palpations: Abdomen is soft.     Tenderness: There is no abdominal tenderness.  Musculoskeletal:        General: No swelling.     Cervical back: Neck supple.     Right lower leg: Edema present.     Left lower leg: Edema present.  Skin:    General: Skin is warm and dry.     Capillary Refill: Capillary refill takes less than 2 seconds.  Neurological:     Mental Status: She is alert.  Psychiatric:        Mood and Affect: Mood normal.    ED Results / Procedures / Treatments   Labs (all labs ordered are listed, but only abnormal results are displayed) Labs Reviewed  BASIC METABOLIC PANEL - Abnormal; Notable for the following components:      Result  Value   Glucose, Bld 118 (*)    Creatinine, Ser 1.02 (*)    Calcium 8.4 (*)    GFR, Estimated 53 (*)    All other components within normal limits  BRAIN NATRIURETIC PEPTIDE - Abnormal; Notable for the following components:   B Natriuretic Peptide 111.5 (*)    All other components within normal limits  CBG MONITORING, ED - Abnormal; Notable for the following components:   Glucose-Capillary 117 (*)    All other components within normal limits  RESP PANEL BY RT-PCR (FLU A&B, COVID) ARPGX2  MAGNESIUM  CBC  TSH  HEPARIN LEVEL (UNFRACTIONATED)  TROPONIN I (HIGH SENSITIVITY)    EKG EKG Interpretation  Date/Time:  Saturday February 15 2021 22:29:34 EST Ventricular Rate:  138 PR Interval:    QRS Duration: 121 QT Interval:  338 QTC Calculation: 513 R Axis:   229 Text Interpretation: Atrial fibrillation Right bundle branch block new onset afib and RBBB Confirmed by Isla Pence [phone]) on 02/15/2021 10:35:51 PM   EKG Interpretation  Date/Time:  Saturday February 15 2021 22:29:34 EST Ventricular Rate:  138 PR Interval:    QRS Duration: 121 QT Interval:  338 QTC Calculation: 513 R Axis:   229 Text Interpretation: Atrial fibrillation Right bundle branch block new onset afib and RBBB Confirmed by Isla Pence [phone]) on 02/15/2021 10:35:51 PM         Radiology DG Chest 2 View  Result Date: 02/15/2021 CLINICAL DATA:  Chest pain, shortness of breath, and headache. EXAM: CHEST - 2 VIEW COMPARISON:  08/30/2020 FINDINGS: Normal heart size and pulmonary vascularity. No focal airspace disease or consolidation in the lungs. No blunting of costophrenic angles. No pneumothorax. Mediastinal contours appear intact. Degenerative changes in the spine and shoulders. Several midthoracic and lower thoracic vertebral compression deformities are present, similar to previous study from 07/07/2015. IMPRESSION: No active cardiopulmonary disease. Electronically Signed   By: Lucienne Capers M.D.   On:  02/15/2021 23:15    Procedures .Critical Care Performed by: Rodney Booze, PA-C Authorized by: Rodney Booze, PA-C   Critical care provider statement:    Critical care time (minutes):  33   Critical care time was exclusive of:  Separately billable procedures and treating other patients   Critical care was necessary to treat or prevent imminent or life-threatening deterioration of the following conditions:  Cardiac failure and circulatory failure   Critical care was time spent personally by me on the following activities:  Development of treatment plan  with patient or surrogate, discussions with consultants, evaluation of patient's response to treatment, examination of patient, ordering and review of laboratory studies, ordering and review of radiographic studies, ordering and performing treatments and interventions, pulse oximetry, re-evaluation of patient's condition and review of old charts   I assumed direction of critical care for this patient from another provider in my specialty: no     Care discussed with: admitting provider      Medications Ordered in ED Medications  diltiazem (CARDIZEM) 125 mg in dextrose 5% 125 mL (1 mg/mL) infusion (has no administration in time range)  heparin bolus via infusion 3,000 Units (has no administration in time range)  heparin ADULT infusion 100 units/mL (25000 units/236mL) (has no administration in time range)    ED Course/ Medical Decision Making/ A&P                           Medical Decision Making Amount and/or Complexity of Data Reviewed Labs: ordered. Radiology: ordered.  Risk Prescription drug management.   This patient presents to the ED for concern of chest tightness, this involves an extensive number of treatment options, and is a complaint that carries with it a high risk of complications and morbidity.  The differential diagnosis includes ACS, PE, arrhythmia, dissection  Comorbidities that complicate the patient  evaluation: Patients presentation is complicated by their history of CAD, HTN, HLD, thyroid disease   Additional history obtained: Additional history obtained from  daughter at bedside Records reviewed Care Everywhere/External Records. No previous hx of afib  Lab Tests: I Ordered, and personally interpreted labs.  The pertinent results include:   CBC wnl BMP with mildly elevated cr, otherwise unremarkable Mag wnl TSH pending on admission BNP mildly eleated Trop neg  Initial EKG - new onset afib and new rbbb Repeat EKG with NSR  Imaging Studies ordered: I ordered imaging studies including X-ray cxr   I independently visualized and interpreted imaging which showed No active cardiopulmonary disease I agree with the radiologist interpretation  Cardiac Monitoring: The patient was maintained on a cardiac monitor.  I personally viewed and interpreted the cardiac monitor which showed an underlying rhythm of:  Atrial Fibrillation w/ RVR  Medicines ordered and prescription drug management: I ordered medication including cardizem and heparin  for rate control and anticoagulation  Reevaluation of the patient after these medicines showed that the patient    improved, converted to NSR  Critical Interventions:  dilt drip, heparin (CHADsVASC = 5)  Consultations Obtained: I requested consultation with the admitting physician Dr. Toney Rakes with cardiology (11:01PM) , and discussed  findings as well as pertinent plan - they recommend: admission  Reevaluation: After the interventions noted above, I reevaluated the patient and found that they have :improved  Complexity of problems addressed: Patients presentation is most consistent with  acute presentation with potential threat to life or bodily function  Disposition: After consideration of the diagnostic results and the patients response to treatment,  I feel that the patent would benefit from admission   .   Final Clinical  Impression(s) / ED Diagnoses Final diagnoses:  None    Rx / DC Orders ED Discharge Orders          Ordered    Amb referral to AFIB Clinic        02/15/21 2243              Bishop [place] 02/15/21 2349    Isla Pence,  MD 02/16/21 1642

## 2021-02-15 NOTE — H&P (Signed)
Cardiology Admission History and Physical:   Patient ID: Carmen Haney MRN: [id number]; DOB: November 09, 1930   Admission date: 02/15/2021  PCP:  Kelton Pillar, MD   [facility] Providers Cardiologist:  Kirk Ruths, MD        Chief Complaint:  palpitations, afib RVR, chest tightness/panic attack  Patient Profile:   Carmen Haney is a 88 y.o. female with h/o CAD, HTN, HLD, hypothyroidism who is being seen 02/15/2021 for the evaluation of palpitations..  History of Present Illness:    Carmen Haney is a 88 y.o. female with h/o CAD, HTN, HLD, hypothyroidism who is being seen 02/15/2021 for the evaluation of palpitations..  Patient is  hard of hearing but a good historian. Reports having panic attacks in the last couple of months and is getting therapy for that. Today while watching TV suddenly felt the worst panic attack, heart racing and chest tightness going upto the jaw, and SOB thus called the daughter who called 911 and brought to the ER Initial EKG afib RVR- en route got IV diltiazem 10mg  x1 and 4 baby aspirin. Her symptoms resolved and no recurrence while in the ER.  She also converted to NSR  Denies any h/o orthopnea, pnd, chest pain or sob on exertion. Once a while feels dizzy, does have LE edema from varicose veins and unsteady gait.  EKG: afib RVR, RBBB Trop negative, bnp 111  Prior cardiac work up NM stress test: 2021 The left ventricular ejection fraction is hyperdynamic (>65%). Nuclear stress EF: 69%. There was no ST segment deviation noted during stress. The study is normal. This is a low risk study.   Normal pharmacologic nuclear study with no evidence for prior infarct or ischemia. Normal LVEF.      Past Medical History:  Diagnosis Date   ACE-inhibitor cough    Breast CA [facility])    s/p lumpectomy & radiation   Breast cancer ([place]) 2010   Right Breast Cancer   CAD (coronary artery disease) 07/1998   s/p cath in 2000. L [place] with 30 to 40%   Hearing loss     HTN (hypertension)    Hyperlipidemia    Obesities, morbid ([place])    Osteoporosis    Personal history of radiation therapy 2010   Right Breast Cancer   Thyroid disease    Vertebral fracture, pathological     Past Surgical History:  Procedure Laterality Date   BREAST BIOPSY Left    BREAST LUMPECTOMY Right March 2010   BREAST LUMPECTOMY WITH RADIOACTIVE SEED LOCALIZATION Left 09/20/2014   Procedure: LEFT BREAST LUMPECTOMY WITH RADIOACTIVE SEED LOCALIZATION;  Surgeon: Autumn Messing III, MD;  Location: [place];  Service: General;  Laterality: Left;   CARPAL TUNNEL RELEASE Right    CESAREAN SECTION     EYE SURGERY     Right Eye   Skin Cancer Removed from Right Forearm     TOTAL ABDOMINAL HYSTERECTOMY       Medications Prior to Admission: Prior to Admission medications   Medication Sig Start Date End Date Taking? Authorizing Provider  aspirin 81 MG tablet Take 81 mg by mouth daily.      [provider]  Calcium Carbonate-Vitamin D (CALCIUM + D PO) Take by mouth 2 (two) times daily.      [provider]  COVID-19 mRNA bivalent vaccine, Pfizer, (PFIZER COVID-19 VAC BIVALENT) injection Inject into the muscle. 11/25/20   Carlyle Basques, MD  furosemide (LASIX) 20 MG tablet TAKE ONE TABLET  BY MOUTH DAILY 12/26/20   Lelon Perla, MD  Levothyroxine Sodium 100 MCG CAPS Take 100 mcg by mouth daily before breakfast.    [provider]  LORazepam (ATIVAN) 0.5 MG tablet Take 0.25 mg by mouth every 8 (eight) hours.    [provider]  losartan (COZAAR) 50 MG tablet TAKE ONE TABLET BY MOUTH DAILY 03/25/20   Lelon Perla, MD  sertraline (ZOLOFT) 100 MG tablet Take 200 mg by mouth daily.  10/29/15   [provider]  simvastatin (ZOCOR) 10 MG tablet Take 10 mg by mouth daily at 6 PM.     [provider]  olmesartan (BENICAR) 20 MG tablet Take 20 mg by mouth daily.    04/25/10  [provider]     Allergies:     Allergies  Allergen Reactions   Ace Inhibitors Cough   Latex Rash    Social History:   Social History   Socioeconomic History   Marital status: Widowed    Spouse name: Tressie Ellis   Number of children: Not on file   Years of education: Not on file   Highest education level: Not on file  Occupational History   Occupation: Retired from [employer]: RETIRED  Tobacco Use   Smoking status: Former    Types: Cigarettes    Quit date: 01/13/1963    Years since quitting: 58.1   Smokeless tobacco: Never   Tobacco comments:    stopped in 1970's  Vaping Use   Vaping Use: Never used  Substance and Sexual Activity   Alcohol use: No    Alcohol/week: 0.0 standard drinks   Drug use: No   Sexual activity: Not on file  Other Topics Concern   Not on file  Social History Narrative   Not on file   Social Determinants of Health   Financial Resource Strain: Not on file  Food Insecurity: Not on file  Transportation Needs: Not on file  Physical Activity: Not on file  Stress: Not on file  Social Connections: Not on file  Intimate Partner Violence: Not on file    Family History:   The patient's family history includes Breast cancer in her mother, sister, and sister; Breast cancer (age of onset: 83) in her daughter; Cancer (age of onset: 56) in her sister; Cancer (age of onset: 52) in her maternal aunt, mother, and sister; Cancer (age of onset: 90) in her father; Coronary artery disease in her mother; Hypertension in her mother; Melanoma in her mother.    ROS:  Please see the history of present illness.  All other ROS reviewed and negative.     Physical Exam/Data:   Vitals:   02/15/21 2222 02/15/21 2223 02/15/21 2224 02/15/21 2237  BP:   (!) 156/100   Pulse: 94 (!) 44    Resp: (!) 24 (!) 21    SpO2: 97% 97% 98%   Weight:    88 kg  Height:    5' 0.25" (1.53 m)   No intake or output data in the 24 hours ending 02/15/21 2321 Last 3 Weights 02/15/2021 11/27/2019 11/07/2019  Weight  (lbs) 194 lb 0.1 oz 194 lb 194 lb 12.8 oz  Weight (kg) 88 kg 87.998 kg 88.361 kg     Body mass index is 37.58 kg/m.  General:  Well nourished, well developed, in no acute distress HEENT: normal Neck: no JVD Vascular: No carotid bruits; Distal pulses 2+ bilaterally   Cardiac:  irregularly irregular rhythm Lungs:  clear to auscultation bilaterally, no wheezing, rhonchi or rales  Abd: soft, nontender, no hepatomegaly  Ext: no edema Musculoskeletal:  No deformities, BUE and BLE strength normal and equal Skin: warm and dry  Neuro:  CNs 2-12 intact, no focal abnormalities noted Psych:  Normal affect    As noted above  Laboratory Data:  High Sensitivity Troponin:  No results for input(s): TROPONINIHS in the last 720 hours.    ChemistryNo results for input(s): NA, K, CL, CO2, GLUCOSE, BUN, CREATININE, CALCIUM, MG, GFRNONAA, GFRAA, ANIONGAP in the last 168 hours.  No results for input(s): PROT, ALBUMIN, AST, ALT, ALKPHOS, BILITOT in the last 168 hours. Lipids No results for input(s): CHOL, TRIG, HDL, LABVLDL, LDLCALC, CHOLHDL in the last 168 hours. Hematology Recent Labs  Lab 02/15/21 2250  WBC 6.3  RBC 4.23  HGB 13.4  HCT 39.8  MCV 94.1  MCH 31.7  MCHC 33.7  RDW 14.1  PLT 229   Thyroid No results for input(s): TSH, FREET4 in the last 168 hours. BNPNo results for input(s): BNP, PROBNP in the last 168 hours.  DDimer No results for input(s): DDIMER in the last 168 hours.   Radiology/Studies:  DG Chest 2 View  Result Date: 02/15/2021 CLINICAL DATA:  Chest pain, shortness of breath, and headache. EXAM: CHEST - 2 VIEW COMPARISON:  08/30/2020 FINDINGS: Normal heart size and pulmonary vascularity. No focal airspace disease or consolidation in the lungs. No blunting of costophrenic angles. No pneumothorax. Mediastinal contours appear intact. Degenerative changes in the spine and shoulders. Several midthoracic and lower thoracic vertebral compression deformities are present, similar to  previous study from 07/07/2015. IMPRESSION: No active cardiopulmonary disease. Electronically Signed   By: Lucienne Capers M.D.   On: 02/15/2021 23:15     Assessment and Plan:   Paroxysmal afib RVR (new onset)--> converted to NSR Chest tightness/palpitations/panic attack sec to above LE edema from varicose veins H/o CAD (LM 40% in 2002 CATH), recent NM stress 2021- negative HTN, HLD, h/o breast cancer Unsteady gait, walks with a walker, fall risk Hypothyroidism  Plan: -> Admit for observation - patient currently in NSR, and denies any chest pain/tightness. Her symptoms resolved, initial trop is negative  - start metoprolol tart 25mg  BID, given elevated chadsvasc score- start eliquis 5mg  bid. D/w patient and daugther at bedside, agree for anticoagulation. Caution given fall risk. - echo in am - trend trops - telemetry and resume home medications (lasix for LE edema)  - discharge am  Full code.    Risk Assessment/Risk Scores:         CHA2DS2-VASc Score =   4  This indicates a  % annual risk of stroke. The patient's score is based upon:        Severity of Illness: The appropriate patient status for this patient is INPATIENT. Inpatient status is judged to be reasonable and necessary in order to provide the required intensity of service to ensure the patient's safety. The patient's presenting symptoms, physical exam findings, and initial radiographic and laboratory data in the context of their chronic comorbidities is felt to place them at high risk for further clinical deterioration. Furthermore, it is not anticipated that the patient will be medically stable for discharge from the hospital within 2 midnights of admission.   * I certify that at the point of admission it is my clinical judgment that the patient will require inpatient hospital care spanning beyond 2 midnights from the point of admission due to high intensity of service, high  risk for further deterioration and high  frequency of surveillance required.*   For questions or updates, please contact [place] Please consult [email or web address] for contact info under     Signed, Renae Fickle, MD  02/15/2021 11:21 PM

## 2021-02-16 DIAGNOSIS — I4891 Unspecified atrial fibrillation: Secondary | ICD-10-CM | POA: Diagnosis present

## 2021-02-16 LAB — CBC
HCT: 37.5 % (ref 36.0–46.0)
Hemoglobin: 12.4 g/dL (ref 12.0–15.0)
MCH: 31.5 pg (ref 26.0–34.0)
MCHC: 33.1 g/dL (ref 30.0–36.0)
MCV: 95.2 fL (ref 80.0–100.0)
Platelets: 227 10*3/uL (ref 150–400)
RBC: 3.94 MIL/uL (ref 3.87–5.11)
RDW: 14 % (ref 11.5–15.5)
WBC: 5.8 10*3/uL (ref 4.0–10.5)
nRBC: 0 % (ref 0.0–0.2)

## 2021-02-16 LAB — BASIC METABOLIC PANEL
Anion gap: 8 (ref 5–15)
BUN: 13 mg/dL (ref 8–23)
CO2: 28 mmol/L (ref 22–32)
Calcium: 8.6 mg/dL — ABNORMAL LOW (ref 8.9–10.3)
Chloride: 101 mmol/L (ref 98–111)
Creatinine, Ser: 0.96 mg/dL (ref 0.44–1.00)
GFR, Estimated: 57 mL/min — ABNORMAL LOW (ref >60)
Glucose, Bld: 111 mg/dL — ABNORMAL HIGH (ref 70–99)
Potassium: 3.5 mmol/L (ref 3.5–5.1)
Sodium: 137 mmol/L (ref 135–145)

## 2021-02-16 LAB — MRSA NEXT GEN BY PCR, NASAL: MRSA by PCR Next Gen: NOT DETECTED

## 2021-02-16 LAB — RESP PANEL BY RT-PCR (FLU A&B, COVID) ARPGX2
Influenza A by PCR: NEGATIVE
Influenza B by PCR: NEGATIVE
SARS Coronavirus 2 by RT PCR: NEGATIVE

## 2021-02-16 LAB — TROPONIN I (HIGH SENSITIVITY)
Troponin I (High Sensitivity): 13 ng/L (ref <18)
Troponin I (High Sensitivity): 15 ng/L (ref <18)

## 2021-02-16 MED ORDER — ACETAMINOPHEN 325 MG PO TABS: 650.0000 mg | ORAL_TABLET | ORAL | Status: DC | PRN

## 2021-02-16 MED ORDER — METOPROLOL TARTRATE 25 MG PO TABS: 12.5000 mg | ORAL_TABLET | Freq: Two times a day (BID) | ORAL | 3 refills | Status: DC

## 2021-02-16 MED ORDER — SERTRALINE HCL 100 MG PO TABS
200.0000 mg | ORAL_TABLET | Freq: Every day | ORAL | Status: DC
  Filled 2021-02-16: qty 2

## 2021-02-16 MED ORDER — METOPROLOL TARTRATE 25 MG PO TABS: 25.0000 mg | ORAL_TABLET | Freq: Two times a day (BID) | ORAL | Status: DC

## 2021-02-16 MED ORDER — METOPROLOL TARTRATE 25 MG PO TABS
25.0000 mg | ORAL_TABLET | Freq: Two times a day (BID) | ORAL | Status: DC
  Administered 2021-02-16: 25 mg via ORAL
  Filled 2021-02-16 (×2): qty 1

## 2021-02-16 MED ORDER — LOSARTAN POTASSIUM 50 MG PO TABS
50.0000 mg | ORAL_TABLET | Freq: Every day | ORAL | Status: DC
  Administered 2021-02-16: 50 mg via ORAL
  Filled 2021-02-16: qty 1

## 2021-02-16 MED ORDER — SIMVASTATIN 20 MG PO TABS
10.0000 mg | ORAL_TABLET | Freq: Every day | ORAL | Status: DC
Start: 2021-02-16 — End: 2021-02-16

## 2021-02-16 MED ORDER — METOPROLOL TARTRATE 12.5 MG HALF TABLET: 12.5000 mg | ORAL_TABLET | Freq: Two times a day (BID) | ORAL | Status: DC

## 2021-02-16 MED ORDER — LEVOTHYROXINE SODIUM 100 MCG PO TABS
100.0000 ug | ORAL_TABLET | Freq: Every day | ORAL | Status: DC
  Administered 2021-02-16: 100 ug via ORAL
  Filled 2021-02-16: qty 1

## 2021-02-16 MED ORDER — APIXABAN 5 MG PO TABS: 5.0000 mg | ORAL_TABLET | Freq: Two times a day (BID) | ORAL | 3 refills | Status: DC

## 2021-02-16 MED ORDER — ASPIRIN EC 81 MG PO TBEC: 81.0000 mg | DELAYED_RELEASE_TABLET | Freq: Every day | ORAL | Status: DC

## 2021-02-16 MED ORDER — ONDANSETRON HCL 4 MG/2ML IJ SOLN: 4.0000 mg | Freq: Four times a day (QID) | INTRAMUSCULAR | Status: DC | PRN

## 2021-02-16 MED ORDER — FUROSEMIDE 20 MG PO TABS
20.0000 mg | ORAL_TABLET | Freq: Every day | ORAL | Status: DC
Start: 2021-02-16 — End: 2021-02-16
  Administered 2021-02-16: 20 mg via ORAL
  Filled 2021-02-16: qty 1

## 2021-02-16 MED ORDER — APIXABAN 5 MG PO TABS
5.0000 mg | ORAL_TABLET | Freq: Two times a day (BID) | ORAL | Status: DC
  Administered 2021-02-16 (×2): 5 mg via ORAL
  Filled 2021-02-16 (×2): qty 1

## 2021-02-16 NOTE — TOC Transition Note (Signed)
Transition of Care [facility]) - CM/SW Discharge Note   Patient Details  Name: Carmen Haney MRN: [id number] Date of Birth: 1930-09-18  Transition of Care [facility]) CM/SW Contact:  Bartholomew Crews, RN Phone Number: [phone] 02/16/2021, 10:49 AM   Clinical Narrative:     Spoke with patient and her daughter, [name], at the bedside. Agreeable to PT recommendation for rollator. Referral to [facility] for delivery to bedside. Family to provide transportation home. No further TOC needs identified.   Final next level of care: Home/Self Care Barriers to Discharge: No Barriers Identified   Patient Goals and CMS Choice Patient states their goals for this hospitalization and ongoing recovery are:: return home CMS Medicare.gov Compare Post Acute Care list provided to:: Patient Choice offered to / list presented to : Patient  Discharge Placement                       Discharge Plan and Services                DME Arranged: Walker rolling with seat DME Agency: [facility] Date DME Agency Contacted: 02/16/21 Time DME Agency Contacted: 0093 Representative spoke with at DME Agency: [place]: NA [place] Agency: NA        Social Determinants of Health ([place]) Interventions     Readmission Risk Interventions No flowsheet data found.

## 2021-02-16 NOTE — Discharge Summary (Addendum)
Discharge Summary    Patient ID: Carmen Haney MRN: [id number]; DOB: 12/26/1930  Admit date: 02/15/2021 Discharge date: 02/16/2021  PCP:  Kelton Pillar, MD   [facility] Providers Cardiologist:  Kirk Ruths, MD   Discharge Diagnoses    Principal Problem:   Atrial fibrillation with RVR [facility]) Active Problems:   CAD (coronary artery disease)   HTN (hypertension)   Hyperlipidemia   Chronic kidney disease (CKD), stage III (moderate) ([place])    Diagnostic Studies/Procedures    Nuclear stress test 11/2019: The left ventricular ejection fraction is hyperdynamic (>65%). Nuclear stress EF: 69%. There was no ST segment deviation noted during stress. The study is normal. This is a low risk study.   Normal pharmacologic nuclear study with no evidence for prior infarct or ischemia. Normal LVEF.    History of Present Illness     Carmen Haney is a 88 y.o. female with CAD, hx of syncope, and bilateral knee pain. She underwent heart cath in 2002 tat showed 40% left main. She had a nonischemic myoview stress test in 08/2013 with normal LVEF. Heart monitor showed rare PACs and PVCs in 2017. In anticipation for bilateral knee replacements, she underwent repeat nuclear stress test 11/2019 that was nonischemic with normal EF. Ultimately, she deferred knee surgery as her husband passed, she had COVID, and then had a fall in June.  She presented to [facility] 02/15/21 after a severe panic attack associated with chest tightness. She has been having panic attacks int he last 2 months and she has been participating in counseling. Last evening, she had "the worst panic attack" associated with heart racing, chest tightness radiating to her jaw, and shortness of breath. She called her daughter who then called EMS. Initial EKG consistent with Afib RVR and she was given 10 mg IV cardizem en route along with 324 mg ASA.  Hospital Course     Consultants: none  Atrial fibrillation with RVR She was given IV  cardizem 10 mg and converted to sinus bradycardia shortly after arriving in the ER. She was started on 25 mg lopressor. Given her HR in the low 50s, will discharge on 12.5 mg lopressor BID.  TSH WNL, K 4.1 BNP mildly elevated to 111 Obtain echo as outpatient.   Need for chronic anticoagulation This patients CHA2DS2-VASc Score and unadjusted Ischemic Stroke Rate (% per year) is equal to 7.2 % stroke rate/year from a score of 5 (female, 2age, HTN, CAD) She was started on 5 mg eliquis BID. She has had one fall last June.    Nonobstructive CAD 40% left main stenosis by heart cath in 2002. Reassuring nuclear stress tests in 2017 and 2021. Continue statin. I have stopped ASA in the setting of eliquis.    Hyperlipidemia with LDL goal < 70 Due for repeat lipid panel. Given left main disease, would    Hypertension PTA maintained on losartan 50 mg daily - will continue this.   Lower extremity edema She is maintained on 20 mg lasix daily at home. She takes lasix 3 times weekly. Obtain echo.   Pt seen and examined by Dr. Debara Pickett and deemed stable for discharge. Follow up has been arranged.    Did the patient have an acute coronary syndrome (MI, NSTEMI, STEMI, etc) this admission?:  No                               Did the patient have a percutaneous  coronary intervention (stent / angioplasty)?:  No.        The patient will be scheduled for a TOC follow up appointment in 7-14 days.  A message has been sent to the [facility] and Scheduling Pool at the office where the patient should be seen for follow up.  _____________  Discharge Vitals Blood pressure 122/73, pulse 63, temperature 97.8 F (36.6 C), temperature source Oral, resp. rate (!) 23, height 5\' 3"  (1.6 m), weight 88.2 kg, SpO2 94 %.  Filed Weights   02/15/21 2237 02/16/21 0257  Weight: 88 kg 88.2 kg   Physical Exam Constitutional:      Appearance: Normal appearance. She is not ill-appearing.  HENT:     Head: Normocephalic and  atraumatic.  Eyes:     Extraocular Movements: Extraocular movements intact.  Neck:     Vascular: No carotid bruit.  Cardiovascular:     Rate and Rhythm: Regular rhythm. Bradycardia present.     Heart sounds: No murmur heard. Pulmonary:     Effort: Pulmonary effort is normal.     Breath sounds: Normal breath sounds.  Abdominal:     General: Abdomen is flat. Bowel sounds are normal.     Palpations: Abdomen is soft.  Musculoskeletal:     Cervical back: Normal range of motion.  Skin:    General: Skin is warm and dry.  Neurological:     Mental Status: She is alert and oriented to person, place, and time.  Psychiatric:        Mood and Affect: Mood normal.    Labs & Radiologic Studies    CBC Recent Labs    02/15/21 2250 02/16/21 0402  WBC 6.3 5.8  HGB 13.4 12.4  HCT 39.8 37.5  MCV 94.1 95.2  PLT 229 371   Basic Metabolic Panel Recent Labs    02/15/21 2250 02/16/21 0402  NA 136 137  K 4.1 3.5  CL 98 101  CO2 27 28  GLUCOSE 118* 111*  BUN 14 13  CREATININE 1.02* 0.96  CALCIUM 8.4* 8.6*  MG 2.1  --    Liver Function Tests No results for input(s): AST, ALT, ALKPHOS, BILITOT, PROT, ALBUMIN in the last 72 hours. No results for input(s): LIPASE, AMYLASE in the last 72 hours. High Sensitivity Troponin:   Recent Labs  Lab 02/15/21 2250 02/16/21 0031 02/16/21 0402  TROPONINIHS 9 13 15     BNP Invalid input(s): POCBNP D-Dimer No results for input(s): DDIMER in the last 72 hours. Hemoglobin A1C No results for input(s): HGBA1C in the last 72 hours. Fasting Lipid Panel No results for input(s): CHOL, HDL, LDLCALC, TRIG, CHOLHDL, LDLDIRECT in the last 72 hours. Thyroid Function Tests Recent Labs    02/15/21 2250  TSH 1.371   _____________  DG Chest 2 View  Result Date: 02/15/2021 CLINICAL DATA:  Chest pain, shortness of breath, and headache. EXAM: CHEST - 2 VIEW COMPARISON:  08/30/2020 FINDINGS: Normal heart size and pulmonary vascularity. No focal airspace  disease or consolidation in the lungs. No blunting of costophrenic angles. No pneumothorax. Mediastinal contours appear intact. Degenerative changes in the spine and shoulders. Several midthoracic and lower thoracic vertebral compression deformities are present, similar to previous study from 07/07/2015. IMPRESSION: No active cardiopulmonary disease. Electronically Signed   By: Lucienne Capers M.D.   On: 02/15/2021 23:15   Disposition   Pt is being discharged home today in good condition.  Follow-up Plans & Appointments     Follow-up Information  Deberah Pelton, NP Follow up on 02/24/2021.   Specialty: Cardiology Why: 2:45 pm Contact information: [address] [place] [address] [phone]                Discharge Instructions     Amb referral to AFIB Clinic   Complete by: As directed    Diet - low sodium heart healthy   Complete by: As directed    Increase activity slowly   Complete by: As directed        Discharge Medications   Allergies as of 02/16/2021       Reactions   Ace Inhibitors Cough   Latex Rash        Medication List     STOP taking these medications    aspirin 81 MG tablet   Pfizer COVID-19 Vac Bivalent injection Generic drug: COVID-19 mRNA bivalent vaccine Therapist, music)       TAKE these medications    acetaminophen 500 MG tablet Commonly known as: TYLENOL Take 1,000 mg by mouth daily.   apixaban 5 MG Tabs tablet Commonly known as: ELIQUIS Take 1 tablet (5 mg total) by mouth 2 (two) times daily.   CALCIUM + D PO Take 2 tablets by mouth daily. gummy   furosemide 20 MG tablet Commonly known as: LASIX TAKE ONE TABLET BY MOUTH DAILY What changed:  when to take this additional instructions   levothyroxine 125 MCG tablet Commonly known as: SYNTHROID Take 125 mcg by mouth daily before breakfast.   LORazepam 0.5 MG tablet Commonly known as: ATIVAN Take 0.5 mg by mouth every 8 (eight) hours as needed for  anxiety.   losartan 50 MG tablet Commonly known as: COZAAR TAKE ONE TABLET BY MOUTH DAILY   metoprolol tartrate 25 MG tablet Commonly known as: LOPRESSOR Take 0.5 tablets (12.5 mg total) by mouth 2 (two) times daily.   polyethylene glycol powder 17 GM/SCOOP powder Commonly known as: GLYCOLAX/MIRALAX Take 17 g by mouth daily as needed for mild constipation.   sertraline 100 MG tablet Commonly known as: ZOLOFT Take 200 mg by mouth at bedtime.   simvastatin 10 MG tablet Commonly known as: ZOCOR Take 10 mg by mouth daily at 6 PM.   SYSTANE OP Place 2-3 drops into both eyes at bedtime.           Outstanding Labs/Studies   OP echo  Duration of Discharge Encounter   Greater than 30 minutes including physician time.  Signed, Tami Lin Jamason Peckham, PA 02/16/2021, 9:03 AM

## 2021-02-16 NOTE — Care Management Obs Status (Signed)
[facility] NOTIFICATION   Patient Details  Name: Carmen Haney MRN: [id number] Date of Birth: [birth year]   Medicare Observation Status Notification Given:  Yes    Bartholomew Crews, RN 02/16/2021, 10:34 AM

## 2021-02-16 NOTE — Care Management CC44 (Signed)
Condition Code 44 Documentation Completed  Patient Details  Name: REMELL GIAIMO MRN: [id number] Date of Birth: 1930/08/27   Condition Code 44 given:  Yes Patient signature on Condition Code 44 notice:  Yes Documentation of 2 MD's agreement:  Yes Code 44 added to claim:  Yes    Bartholomew Crews, RN 02/16/2021, 10:34 AM

## 2021-02-16 NOTE — ED Notes (Signed)
Report called to the April cooper rn

## 2021-02-16 NOTE — Evaluation (Signed)
Physical Therapy Evaluation & Discharge Patient Details Name: Carmen Haney MRN: [id number] DOB: Feb 28, 1930 Today's Date: 02/16/2021  History of Present Illness  Pt is an 88 y.o. female who presented 02/15/21 with palpitations. Initial trop negative. Pt with new onset Afib with RVR but converted to NSR. PMH: CAD, HTN, HLD, hypothyroidism, breast CA, oestoporosis   Clinical Impression  Pt presents with condition above. PTA, she was living on the main floor of a house with 4 STE with her daughter and son-in-law, receiving assistance from them PRN. Pt uses a walker for mobility at baseline and reports x1 falls in past year. Currently, pt is functioning at her baseline. Provided pt with HEP handout to address her deficits in balance and knee pain from arthritis. All education completed and questions answered. PT will sign off.       Recommendations for follow up therapy are one component of a multi-disciplinary discharge planning process, led by the attending physician.  Recommendations may be updated based on patient status, additional functional criteria and insurance authorization.  Follow Up Recommendations No PT follow up    Assistance Recommended at Discharge PRN  Patient can return home with the following  A little help with bathing/dressing/bathroom;Assistance with cooking/housework;Assist for transportation;Help with stairs or ramp for entrance    Equipment Recommendations Rollator (4 wheels) (current ones are too short for her)  Recommendations for Other Services       Functional Status Assessment Patient has not had a recent decline in their functional status     Precautions / Restrictions Precautions Precautions: Fall Restrictions Weight Bearing Restrictions: No      Mobility  Bed Mobility Overal bed mobility: Modified Independent             General bed mobility comments: Pt able to transition supine <> sit EOB with HOB elevated without assistance, extra time.     Transfers Overall transfer level: Needs assistance Equipment used: Rolling walker (2 wheels) Transfers: Sit to/from Stand Sit to Stand: Supervision           General transfer comment: Supervision for safety, no LOB.    Ambulation/Gait Ambulation/Gait assistance: Min guard Gait Distance (Feet): 180 Feet Assistive device: Rolling walker (2 wheels) Gait Pattern/deviations: Step-through pattern, Decreased stride length, Trunk flexed Gait velocity: reduced Gait velocity interpretation: <1.31 ft/sec, indicative of household ambulator   General Gait Details: Pt with slow, short steps and slightly flexed trunk. Reports this is her baseline, no LOB, min guard for safety.  Stairs            Wheelchair Mobility    Modified Rankin (Stroke Patients Only)       Balance Overall balance assessment: Needs assistance Sitting-balance support: No upper extremity supported, Feet supported Sitting balance-Leahy Scale: Fair     Standing balance support: Bilateral upper extremity supported, Single extremity supported, During functional activity, Reliant on assistive device for balance Standing balance-Leahy Scale: Poor Standing balance comment: Reliant on 1-2 UE support                             Pertinent Vitals/Pain Pain Assessment Pain Assessment: Faces Faces Pain Scale: Hurts a little bit Pain Location: legs Pain Descriptors / Indicators: Tender Pain Intervention(s): Limited activity within patient's tolerance, Monitored during session, Repositioned    Home Living Family/patient expects to be discharged to:: Private residence Living Arrangements: Children Available Help at Discharge: Family Type of Home: House Home Access: Stairs to enter Entrance  Stairs-Rails: Right;Left Entrance Stairs-Number of Steps: 4   Home Layout: Two level;Able to live on main level with bedroom/bathroom Home Equipment: Rollator (4 wheels);BSC/3in1;Tub bench;Toilet  riser;Transport chair;Hospital bed (rollators too short for pt; stand up rollator)      Prior Function Prior Level of Function : Needs assist       Physical Assist : Mobility (physical) Mobility (physical): Stairs   Mobility Comments: Pt needing HHA on stairs. Uses walkers for support with household and community distance mobility. x1 fall in last year       Hand Dominance        Extremity/Trunk Assessment   Upper Extremity Assessment Upper Extremity Assessment: Defer to OT evaluation    Lower Extremity Assessment Lower Extremity Assessment: RLE deficits/detail;LLE deficits/detail RLE Deficits / Details: WFL strength; denies numbness/tingling; edema noted with erythema lower legs bil (L>R) LLE Deficits / Details: WFL strength; denies numbness/tingling; edema noted with erythema lower legs bil (L>R)    Cervical / Trunk Assessment Cervical / Trunk Assessment: Kyphotic  Communication   Communication: HOH  Cognition Arousal/Alertness: Awake/alert Behavior During Therapy: WFL for tasks assessed/performed Overall Cognitive Status: Within Functional Limits for tasks assessed                                          General Comments General comments (skin integrity, edema, etc.): Educated pt on use of compression stockings to manage edema and blood flow in lower legs, which she has at home already. Educated pt on exercises to address her balance and knee arthritis pain, provided [employer] HEP handout with Access Code: [email or web address]; HR 70-90s    Exercises     Assessment/Plan    PT Assessment Patient does not need any further PT services  PT Problem List Decreased activity tolerance;Decreased balance;Decreased mobility       PT Treatment Interventions      PT Goals (Current goals can be found in the Care Plan section)  Acute Rehab PT Goals Patient Stated Goal: to manage her knee arthritis pain PT Goal Formulation: All assessment and education complete, DC  therapy Time For Goal Achievement: 02/17/21 Potential to Achieve Goals: Good    Frequency       Co-evaluation               AM-PAC PT "6 Clicks" Mobility  Outcome Measure Help needed turning from your back to your side while in a flat bed without using bedrails?: None Help needed moving from lying on your back to sitting on the side of a flat bed without using bedrails?: None Help needed moving to and from a bed to a chair (including a wheelchair)?: A Little Help needed standing up from a chair using your arms (e.g., wheelchair or bedside chair)?: A Little Help needed to walk in hospital room?: A Little Help needed climbing 3-5 steps with a railing? : A Little 6 Click Score: 20    End of Session   Activity Tolerance: Patient tolerated treatment well Patient left: in bed;with call bell/phone within reach;with bed alarm set;with family/visitor present Nurse Communication: Mobility status PT Visit Diagnosis: Other abnormalities of gait and mobility (R26.89);Difficulty in walking, not elsewhere classified (R26.2)    Time: 6578-4696 PT Time Calculation (min) (ACUTE ONLY): 30 min   Charges:   PT Evaluation $PT Eval Moderate Complexity: 1 Mod PT Treatments $Therapeutic Activity: 8-22 mins  Moishe Spice, PT, DPT Acute Rehabilitation Services  Pager: [phone] Office: [place] 02/16/2021, 10:11 AM

## 2021-02-16 NOTE — Progress Notes (Signed)
RN went over d/c summary w/ pt and her daughter. Belongings w/ pt's daughter, including rollator. RN removed PIV. Another RN transported pt to private vehicle where pt's daughter transporting pt home

## 2021-02-17 ENCOUNTER — Telehealth: Payer: Self-pay | Admitting: *Deleted

## 2021-02-17 NOTE — Telephone Encounter (Signed)
-----   Message from Ledora Bottcher, [place] sent at 02/16/2021  9:56 AM EST ----- Pt discharged today, I made her TOC appt.   She will need a TOC phone call.  Thanks Angie

## 2021-02-17 NOTE — Telephone Encounter (Signed)
Patient contacted regarding discharge from [facility] on 02/16/2021.  Patient understands to follow up with provider Coletta Memos, NP on 02/24/2021 at 2:45 PM at [facility] office. Patient understands discharge instructions? yes Patient understands medications and regiment? yes Patient understands to bring all medications to this visit? Yes

## 2021-02-19 ENCOUNTER — Other Ambulatory Visit: Payer: Self-pay

## 2021-02-19 ENCOUNTER — Ambulatory Visit: Payer: Medicare PPO | Admitting: Podiatry

## 2021-02-19 ENCOUNTER — Encounter: Payer: Self-pay | Admitting: Podiatry

## 2021-02-19 DIAGNOSIS — B351 Tinea unguium: Secondary | ICD-10-CM

## 2021-02-19 DIAGNOSIS — M79674 Pain in right toe(s): Secondary | ICD-10-CM | POA: Diagnosis not present

## 2021-02-19 DIAGNOSIS — L304 Erythema intertrigo: Secondary | ICD-10-CM | POA: Insufficient documentation

## 2021-02-19 DIAGNOSIS — M79675 Pain in left toe(s): Secondary | ICD-10-CM

## 2021-02-19 DIAGNOSIS — Z85828 Personal history of other malignant neoplasm of skin: Secondary | ICD-10-CM | POA: Insufficient documentation

## 2021-02-19 DIAGNOSIS — G629 Polyneuropathy, unspecified: Secondary | ICD-10-CM

## 2021-02-19 DIAGNOSIS — L84 Corns and callosities: Secondary | ICD-10-CM

## 2021-02-19 DIAGNOSIS — L821 Other seborrheic keratosis: Secondary | ICD-10-CM | POA: Insufficient documentation

## 2021-02-19 DIAGNOSIS — D1801 Hemangioma of skin and subcutaneous tissue: Secondary | ICD-10-CM | POA: Insufficient documentation

## 2021-02-20 NOTE — Progress Notes (Signed)
Cardiology Clinic Note   Patient Name: KENNEDY BRINES Date of Encounter: 02/20/2021  Primary Care Provider:  Kelton Pillar, MD Primary Cardiologist:  Kirk Ruths, MD  Patient Profile    NOEL HENANDEZ 88 year old female presents the clinic today for follow-up evaluation of her coronary artery disease and atrial fibrillation.  Past Medical History    Past Medical History:  Diagnosis Date   ACE-inhibitor cough    Breast CA [facility])    s/p lumpectomy & radiation   Breast cancer ([place]) 2010   Right Breast Cancer   CAD (coronary artery disease) 07/1998   s/p cath in 2000. L main with 30 to 40%   Hearing loss    HTN (hypertension)    Hyperlipidemia    Obesities, morbid ([place])    Osteoporosis    Personal history of radiation therapy 2010   Right Breast Cancer   Thyroid disease    Vertebral fracture, pathological    Past Surgical History:  Procedure Laterality Date   BREAST BIOPSY Left    BREAST LUMPECTOMY Right March 2010   BREAST LUMPECTOMY WITH RADIOACTIVE SEED LOCALIZATION Left 09/20/2014   Procedure: LEFT BREAST LUMPECTOMY WITH RADIOACTIVE SEED LOCALIZATION;  Surgeon: Autumn Messing III, MD;  Location: [place];  Service: General;  Laterality: Left;   CARPAL TUNNEL RELEASE Right    CESAREAN SECTION     EYE SURGERY     Right Eye   Skin Cancer Removed from Right Forearm     TOTAL ABDOMINAL HYSTERECTOMY      Allergies  Allergies  Allergen Reactions   Ace Inhibitors Cough   Latex Rash    History of Present Illness    TYNLEE BAYLE is a PMH of coronary artery disease hypertension, atrial fibrillation with RVR, thyroid disease, degenerative intervertebral disc disease CKD stage III, morbid obesity, HLD, and neoplasm of right breast.  She had cardiac catheterization in 2002 which showed 40% left main.  She had nuclear stress test 8/15 which showed normal LV function.  Her cardiac event monitor showed rare PACs and PVCs in 2017.  Her nuclear stress test 11/21  showed normal LVEF and low risk.  She sustained a fall in June 2021.  She presented to the [facility] emergency department 02/15/2021.  During that time she was noted to be having a severe panic attack associated with chest tightness.  She had been having panic attacks intermittently over the last 2 months.  She reported that she had been participating in counseling.  The prior evening she reported she had a significant panic attack.  She noted racing heartbeat, chest tightness with radiation to her jaw, and shortness of breath.  She contacted her daughter who called EMS.  Her initial EKG showed atrial fibrillation with RVR and she was given 10 mg of IV Cardizem.  She also received 324 mg of aspirin.  Cardiology was consulted and she was started on 25 mg Lopressor.  Her heart rate reduced into the low 50s.  She was discharged on 12.5 mg of Lopressor twice daily.  Her BNP was mildly elevated at 111.  Plan for outpatient echocardiogram was made.  CHA2DS2-VASc score is 5 (female, age, hypertension, coronary artery disease).  She was started on apixaban 5 mg twice daily.  She presents to the clinic today for follow-up evaluation states she continues to feel anxious at times.  She takes her lorazepam as needed.  She has not had any further episodes of chest tightness or discomfort.  We reviewed her recent trip to the emergency department.  Her blood pressure at home has been in the 885-027 range systolic.  She presents with her daughter [name].  She has several concerns about her knee pain.  We reviewed options for pain relief (Tylenol, Voltaren gel, magnesium ).  I reviewed plan for echocardiogram and will proceed to ordering.  I would like her to increase her physical activity as tolerated and we will plan follow-up for 3 to 4 months.  I will also give her the [place] support stocking sheet so that she may have some custom below the knee support stockings.  Today she denies chest pain, shortness of breath, lower  extremity edema, fatigue, palpitations, melena, hematuria, hemoptysis, diaphoresis, weakness, presyncope, syncope, orthopnea, and PND.   Home Medications    Prior to Admission medications   Medication Sig Start Date End Date Taking? Authorizing Provider  acetaminophen (TYLENOL) 500 MG tablet Take 1,000 mg by mouth daily.    [provider]  apixaban (ELIQUIS) 5 MG TABS tablet Take 1 tablet (5 mg total) by mouth 2 (two) times daily. 02/16/21   [facility], Tami Lin, PA  Calcium Carbonate-Vitamin D (CALCIUM + D PO) Take 2 tablets by mouth daily. gummy    [provider]  furosemide (LASIX) 20 MG tablet TAKE ONE TABLET BY MOUTH DAILY Patient taking differently: Take 20 mg by mouth 3 (three) times a week. No set days 12/26/20   Lelon Perla, MD  levothyroxine (SYNTHROID) 125 MCG tablet Take 125 mcg by mouth daily before breakfast.    [provider]  LORazepam (ATIVAN) 0.5 MG tablet Take 0.5 mg by mouth every 8 (eight) hours as needed for anxiety.    [provider]  losartan (COZAAR) 50 MG tablet TAKE ONE TABLET BY MOUTH DAILY 03/25/20   Lelon Perla, MD  metoprolol tartrate (LOPRESSOR) 25 MG tablet Take 0.5 tablets (12.5 mg total) by mouth 2 (two) times daily. 02/16/21   [facility], Tami Lin, PA  Polyethyl Glycol-Propyl Glycol (SYSTANE OP) Place 2-3 drops into both eyes at bedtime.    [provider]  polyethylene glycol powder (GLYCOLAX/MIRALAX) 17 GM/SCOOP powder Take 17 g by mouth daily as needed for mild constipation.    [provider]  sertraline (ZOLOFT) 100 MG tablet Take 200 mg by mouth at bedtime. 10/29/15   [provider]  simvastatin (ZOCOR) 10 MG tablet Take 10 mg by mouth daily at 6 PM.     [provider]  olmesartan (BENICAR) 20 MG tablet Take 20 mg by mouth daily.    04/25/10  [provider]    Family History    Family History  Problem Relation Age of Onset   Coronary artery disease Mother     Hypertension Mother    Melanoma Mother    Cancer Mother 51       breast   Breast cancer Mother    Cancer Father 34       prostate   Cancer Sister 21       breast   Breast cancer Sister    Cancer Maternal Aunt 62       breast   Cancer Sister 10       breast   Breast cancer Sister    Breast cancer Daughter 25   She indicated that her mother is deceased. She indicated that her father is deceased. She indicated that her maternal grandmother is deceased. She indicated that her maternal grandfather is deceased.  She indicated that her paternal grandmother is deceased. She indicated that her paternal grandfather is deceased. She indicated that the status of her daughter is unknown. She indicated that the status of her maternal aunt is unknown.  Social History    Social History   Socioeconomic History   Marital status: Widowed    Spouse name: Tressie Ellis   Number of children: Not on file   Years of education: Not on file   Highest education level: Not on file  Occupational History   Occupation: Retired from [employer]: RETIRED  Tobacco Use   Smoking status: Former    Types: Cigarettes    Quit date: 01/13/1963    Years since quitting: 58.1   Smokeless tobacco: Never   Tobacco comments:    stopped in 1970's  Vaping Use   Vaping Use: Never used  Substance and Sexual Activity   Alcohol use: No    Alcohol/week: 0.0 standard drinks   Drug use: No   Sexual activity: Not on file  Other Topics Concern   Not on file  Social History Narrative   Not on file   Social Determinants of Health   Financial Resource Strain: Not on file  Food Insecurity: Not on file  Transportation Needs: Not on file  Physical Activity: Not on file  Stress: Not on file  Social Connections: Not on file  Intimate Partner Violence: Not on file     Review of Systems    General:  No chills, fever, night sweats or weight changes.  Cardiovascular:  No chest pain, dyspnea on exertion, edema, orthopnea,  palpitations, paroxysmal nocturnal dyspnea. Dermatological: No rash, lesions/masses Respiratory: No cough, dyspnea Urologic: No hematuria, dysuria Abdominal:   No nausea, vomiting, diarrhea, bright red blood per rectum, melena, or hematemesis Neurologic:  No visual changes, wkns, changes in mental status. All other systems reviewed and are otherwise negative except as noted above.  Physical Exam    VS:  There were no vitals taken for this visit. , BMI There is no height or weight on file to calculate BMI. GEN: Well nourished, well developed, in no acute distress. HEENT: normal. Neck: Supple, no JVD, carotid bruits, or masses. Cardiac: RRR, no murmurs, rubs, or gallops. No clubbing, cyanosis, edema.  Radials/DP/PT 2+ and equal bilaterally.  Respiratory:  Respirations regular and unlabored, clear to auscultation bilaterally. GI: Soft, nontender, nondistended, BS + x 4. MS: no deformity or atrophy. Skin: warm and dry, no rash. Neuro:  Strength and sensation are intact. Psych: Normal affect.  Accessory Clinical Findings    Recent Labs: 09/14/2020: ALT 15 02/15/2021: B Natriuretic Peptide 111.5; Magnesium 2.1; TSH 1.371 02/16/2021: BUN 13; Creatinine, Ser 0.96; Hemoglobin 12.4; Platelets 227; Potassium 3.5; Sodium 137   Recent Lipid Panel No results found for: CHOL, TRIG, HDL, CHOLHDL, VLDL, LDLCALC, LDLDIRECT  ECG personally reviewed by me today-none today.  Nuclear stress test 11/27/2019 The left ventricular ejection fraction is hyperdynamic (>65%). Nuclear stress EF: 69%. There was no ST segment deviation noted during stress. The study is normal. This is a low risk study.   Normal pharmacologic nuclear study with no evidence for prior infarct or ischemia. Normal LVEF.  Assessment & Plan   1.  Atrial fibrillation-was noted to be in atrial fibrillation with RVR 02/16/2021.  She received IV Cardizem which converted her to sinus bradycardia.  She was started on metoprolol 25 mg and her  heart rate decreased to the low 50s.  She was discharged on metoprolol  12.5 mg twice daily.  Reports compliance with apixaban and denies bleeding issues. Continue metoprolol, apixaban Heart healthy low-sodium diet-salty 6 given Increase physical activity as tolerated Avoid triggers caffeine, chocolate, EtOH, dehydration etc. Order echocardiogram   Coronary artery disease-denies episodes of chest discomfort since being discharged from the hospital.  Underwent cardiac catheterization in 2002 which showed 40% left main stenosis.  Nuclear stress test in 2017 and 2021 showed low risk.  No aspirin in the setting of Eliquis. Continue simvastatin, losartan, metoprolol Heart healthy low-sodium diet-salty 6 given Increase physical activity as tolerated  Hyperlipidemia-LDL 88 Continue simvastatin Heart healthy low-sodium high-fiber diet.   Increase physical activity as tolerated  Essential hypertension-BP today 140/78.  Well-controlled at home 414-239 systolic. Continue metoprolol, losartan Heart healthy low-sodium diet-salty 6 given Increase physical activity as tolerated  Lower extremity edema-chronic dependent edema. Continue furosemide Lower extremity support stockings-[place] support stocking sheet given. Elevate lower extremities when not active  Disposition: Follow-up with Dr. Stanford Breed 3-4 months .  Jossie Ng. Amariyana Heacox NP-C    02/20/2021, 12:29 PM [address] Office [phone] Fax [phone]  Notice: This dictation was prepared with Dragon dictation along with smaller phrase technology. Any transcriptional errors that result from this process are unintentional and may not be corrected upon review.  I spent 15 minutes examining this patient, reviewing medications, and using patient centered shared decision making involving her cardiac care.  Prior to her visit I spent greater than 20 minutes reviewing her past medical history,   medications, and prior cardiac tests.

## 2021-02-24 ENCOUNTER — Other Ambulatory Visit: Payer: Self-pay

## 2021-02-24 ENCOUNTER — Encounter: Payer: Self-pay | Admitting: General Practice

## 2021-02-24 ENCOUNTER — Ambulatory Visit: Payer: Medicare PPO | Admitting: General Practice

## 2021-02-24 VITALS — BP 140/78 | HR 83 | Ht 63.0 in | Wt 197.8 lb

## 2021-02-24 DIAGNOSIS — R600 Localized edema: Secondary | ICD-10-CM | POA: Diagnosis not present

## 2021-02-24 DIAGNOSIS — I10 Essential (primary) hypertension: Secondary | ICD-10-CM

## 2021-02-24 DIAGNOSIS — E7800 Pure hypercholesterolemia, unspecified: Secondary | ICD-10-CM

## 2021-02-24 DIAGNOSIS — I2510 Atherosclerotic heart disease of native coronary artery without angina pectoris: Secondary | ICD-10-CM | POA: Diagnosis not present

## 2021-02-24 DIAGNOSIS — I482 Chronic atrial fibrillation, unspecified: Secondary | ICD-10-CM

## 2021-02-24 NOTE — Patient Instructions (Signed)
Medication Instructions:  MAY TAKE MAGNESIUM, VOLTAREN AND TYLENOL  *If you need a refill on your cardiac medications before your next appointment, please call your pharmacy*  Lab Work: NONE  Testing/Procedures: Echocardiogram - Your physician has requested that you have an echocardiogram. Echocardiography is a painless test that uses sound waves to create images of your heart. It provides your doctor with information about the size and shape of your heart and how well your hearts chambers and valves are working. This procedure takes approximately one hour. There are no restrictions for this procedure. This will be performed at either our [facility] location - [address] [place] location [facility] 2nd floor.  Special Instructions  PLEASE INCREASE PHYSICAL ACTIVITY AS TOLERATED  Follow-Up: Your next appointment:  3-4 month(s) In Person with Kirk Ruths, MD    At [facility], you and your health needs are our priority.  As part of our continuing mission to provide you with exceptional heart care, we have created designated Provider Care Teams.  These Care Teams include your primary Cardiologist (physician) and Advanced Practice Providers (APPs -  Physician Assistants and Nurse Practitioners) who all work together to provide you with the care you need, when you need it.

## 2021-02-26 DIAGNOSIS — F331 Major depressive disorder, recurrent, moderate: Secondary | ICD-10-CM | POA: Diagnosis not present

## 2021-02-26 DIAGNOSIS — F411 Generalized anxiety disorder: Secondary | ICD-10-CM | POA: Diagnosis not present

## 2021-02-26 NOTE — Progress Notes (Signed)
°  Subjective:  Patient ID: Carmen Haney, female    DOB: 07/30/1930,  MRN: [id number]  Carmen Haney presents to clinic today for at risk foot care with history of peripheral neuropathy and callus(es) bilaterally and painful thick toenails that are difficult to trim. Painful toenails interfere with ambulation. Aggravating factors include wearing enclosed shoe gear. Pain is relieved with periodic professional debridement. Painful calluses are aggravated when weightbearing with and without shoegear. Pain is relieved with periodic professional debridement.  New problem(s): None.   PCP is Kelton Pillar, MD , and last visit was January 03, 2021.  Allergies  Allergen Reactions   Ace Inhibitors Cough   Latex Rash    Review of Systems: Negative except as noted in the HPI. Objective:   Constitutional Carmen Haney is a pleasant 88 y.o. Caucasian female, in NAD. AAO x 3.   Vascular CFT <3 seconds b/l LE. Faintly palpable pedal pulses b/l. Pedal hair absent. No pain with calf compression b/l. Lower extremity skin temperature gradient within normal limits. Trace edema noted bilateral ankles. Varicosities present b/l. No cyanosis or clubbing noted b/l LE.  Neurologic Normal speech. Oriented to person, place, and time. Pt has subjective symptoms of neuropathy. Protective sensation intact 5/5 intact bilaterally with 10g monofilament b/l. Vibratory sensation diminished b/l.  Dermatologic Pedal skin thin, shiny and atrophic b/l LE. No open wounds b/l LE. No interdigital macerations noted b/l LE. Toenails 1-5 b/l elongated, discolored, dystrophic, thickened, crumbly with subungual debris and tenderness to dorsal palpation. Hyperkeratotic lesion(s) submet head 1 left foot and submet head 1 right foot.  No erythema, no edema, no drainage, no fluctuance.  Orthopedic: Muscle strength 5/5 to all lower extremity muscle groups bilaterally. Hallux hammertoe noted b/l LE. Hammertoe(s) noted to the bilateral 2nd  toes.   Radiographs: None  Last A1c: No flowsheet data found.   Assessment:   1. Pain due to onychomycosis of toenails of both feet   2. Callus   3. Neuropathy    Plan:  Patient was evaluated and treated and all questions answered. Consent given for treatment as described below: -Mycotic toenails 1-5 bilaterally were debrided in length and girth with sterile nail nippers and dremel without incident. -Callus(es) submet head 1 b/l pared utilizing sterile scalpel blade without complication or incident. Total number debrided =2. -Patient/POA to call should there be question/concern in the interim.  Return in about 9 weeks (around 04/23/2021).  Marzetta Board, DPM

## 2021-03-06 ENCOUNTER — Other Ambulatory Visit (HOSPITAL_COMMUNITY): Payer: Self-pay

## 2021-03-19 ENCOUNTER — Ambulatory Visit (HOSPITAL_COMMUNITY): Payer: Medicare PPO | Attending: Cardiovascular Disease

## 2021-03-19 ENCOUNTER — Other Ambulatory Visit: Payer: Self-pay

## 2021-03-19 DIAGNOSIS — I482 Chronic atrial fibrillation, unspecified: Secondary | ICD-10-CM | POA: Insufficient documentation

## 2021-03-19 DIAGNOSIS — I4891 Unspecified atrial fibrillation: Secondary | ICD-10-CM | POA: Diagnosis not present

## 2021-03-19 LAB — ECHOCARDIOGRAM COMPLETE
Area-P 1/2: 3.4 cm2
MV VTI: 1.4 cm2
S' Lateral: 2.6 cm

## 2021-03-20 DIAGNOSIS — F331 Major depressive disorder, recurrent, moderate: Secondary | ICD-10-CM | POA: Diagnosis not present

## 2021-03-20 DIAGNOSIS — F411 Generalized anxiety disorder: Secondary | ICD-10-CM | POA: Diagnosis not present

## 2021-03-27 DIAGNOSIS — F411 Generalized anxiety disorder: Secondary | ICD-10-CM | POA: Diagnosis not present

## 2021-03-27 DIAGNOSIS — F331 Major depressive disorder, recurrent, moderate: Secondary | ICD-10-CM | POA: Diagnosis not present

## 2021-03-31 DIAGNOSIS — F411 Generalized anxiety disorder: Secondary | ICD-10-CM | POA: Diagnosis not present

## 2021-03-31 DIAGNOSIS — F331 Major depressive disorder, recurrent, moderate: Secondary | ICD-10-CM | POA: Diagnosis not present

## 2021-04-02 DIAGNOSIS — F411 Generalized anxiety disorder: Secondary | ICD-10-CM | POA: Diagnosis not present

## 2021-04-02 DIAGNOSIS — F331 Major depressive disorder, recurrent, moderate: Secondary | ICD-10-CM | POA: Diagnosis not present

## 2021-04-04 DIAGNOSIS — E039 Hypothyroidism, unspecified: Secondary | ICD-10-CM | POA: Diagnosis not present

## 2021-04-05 ENCOUNTER — Other Ambulatory Visit: Payer: Self-pay | Admitting: Cardiology

## 2021-04-10 DIAGNOSIS — F411 Generalized anxiety disorder: Secondary | ICD-10-CM | POA: Diagnosis not present

## 2021-04-10 DIAGNOSIS — F331 Major depressive disorder, recurrent, moderate: Secondary | ICD-10-CM | POA: Diagnosis not present

## 2021-04-17 DIAGNOSIS — F411 Generalized anxiety disorder: Secondary | ICD-10-CM | POA: Diagnosis not present

## 2021-04-17 DIAGNOSIS — F331 Major depressive disorder, recurrent, moderate: Secondary | ICD-10-CM | POA: Diagnosis not present

## 2021-04-24 DIAGNOSIS — F411 Generalized anxiety disorder: Secondary | ICD-10-CM | POA: Diagnosis not present

## 2021-04-24 DIAGNOSIS — F331 Major depressive disorder, recurrent, moderate: Secondary | ICD-10-CM | POA: Diagnosis not present

## 2021-04-30 ENCOUNTER — Encounter: Payer: Self-pay | Admitting: Podiatry

## 2021-04-30 ENCOUNTER — Ambulatory Visit: Payer: Medicare PPO | Admitting: Podiatry

## 2021-04-30 DIAGNOSIS — M79674 Pain in right toe(s): Secondary | ICD-10-CM

## 2021-04-30 DIAGNOSIS — R600 Localized edema: Secondary | ICD-10-CM | POA: Diagnosis not present

## 2021-04-30 DIAGNOSIS — M79675 Pain in left toe(s): Secondary | ICD-10-CM

## 2021-04-30 DIAGNOSIS — F411 Generalized anxiety disorder: Secondary | ICD-10-CM | POA: Diagnosis not present

## 2021-04-30 DIAGNOSIS — B351 Tinea unguium: Secondary | ICD-10-CM

## 2021-04-30 DIAGNOSIS — G629 Polyneuropathy, unspecified: Secondary | ICD-10-CM

## 2021-04-30 DIAGNOSIS — F331 Major depressive disorder, recurrent, moderate: Secondary | ICD-10-CM | POA: Diagnosis not present

## 2021-05-01 ENCOUNTER — Telehealth: Payer: Self-pay | Admitting: Cardiology

## 2021-05-01 DIAGNOSIS — F411 Generalized anxiety disorder: Secondary | ICD-10-CM | POA: Diagnosis not present

## 2021-05-01 DIAGNOSIS — F331 Major depressive disorder, recurrent, moderate: Secondary | ICD-10-CM | POA: Diagnosis not present

## 2021-05-01 DIAGNOSIS — R600 Localized edema: Secondary | ICD-10-CM

## 2021-05-01 NOTE — Telephone Encounter (Signed)
Pt c/o Shortness Of Breath: STAT if SOB developed within the last 24 hours or pt is noticeably SOB on the phone ? ?1. Are you currently SOB (can you hear that pt is SOB on the phone)? Patient not on the phone ? ?2. How long have you been experiencing SOB? 3 days ? ?3. Are you SOB when sitting or when up moving around? both ? ?4. Are you currently experiencing any other symptoms? Sluggish, more tired, low BP ? ? ?Patient's daughter states the patient has had SOB for about 3 days. She says the patient is feeling breathless after walking. She says she is also more fatigued and her BP was low. She states they bought a wrist BP machine and are not sure how accurate it is, because she thinks the reading was around 98/30.  ? ?

## 2021-05-01 NOTE — Telephone Encounter (Signed)
Spoke to dtr, verbal permission given by pt. ? ?Calling to report worsening SOB/DOE.  ?Dtr reports visually witnessing shallow breathing and pt complaining of it. ?Occurred for the last 3 days. ?No CP ?LEE, left greater than right.  This has not improved since being seen in February, states it has worsened.  Pt cannot find hose that fit/stay up. ?BP low today 97/30 w/ wrist cuff, HR 61. ?Pt has taken Lopressor once daily for the past 2 days d/t this concern. ?They do not think pt has experienced anymore afib since diagnosis earlier this year. ? ?Aware I will forward to MD & RN to review.   ?Aware RN will follow up on this matter. ?Dtr verbalized understanding and agreeable to plan.  ? ?

## 2021-05-02 MED ORDER — FUROSEMIDE 20 MG PO TABS: 20.0000 mg | ORAL_TABLET | Freq: Every day | ORAL | 3 refills | Status: DC

## 2021-05-02 NOTE — Telephone Encounter (Signed)
Spoke with pt and daughter, Aware of dr Jacalyn Lefevre recommendations.  ?Lab orders mailed to the pt  ?

## 2021-05-07 DIAGNOSIS — F331 Major depressive disorder, recurrent, moderate: Secondary | ICD-10-CM | POA: Diagnosis not present

## 2021-05-07 DIAGNOSIS — F411 Generalized anxiety disorder: Secondary | ICD-10-CM | POA: Diagnosis not present

## 2021-05-10 NOTE — Progress Notes (Signed)
?  Subjective:  ?Patient ID: Carmen Haney, female    DOB: [birth year],  MRN: [id number] ? ?Carmen Haney presents to clinic today for at risk foot care with history of peripheral neuropathy and callus(es) b/l lower extremities and painful thick toenails that are difficult to trim. Painful toenails interfere with ambulation. Aggravating factors include wearing enclosed shoe gear. Pain is relieved with periodic professional debridement. Painful calluses are aggravated when weightbearing with and without shoegear. Pain is relieved with periodic professional debridement. ? ?Ms. Creedon is accompanied by her daughter, Carmen Haney, on today's visit. ? ?She is experiencing some issues with her compression hose as they gather around her ankles and she experiences skin indentations.  ? ?PCP is Kelton Pillar, MD , and last visit was April 07, 2021. ? ?Allergies  ?Allergen Reactions  ? Ace Inhibitors Cough  ? Latex Rash  ? ? ?Review of Systems: Negative except as noted in the HPI. ? ?Objective: No changes noted in today's physical examination. ?Constitutional Carmen Haney is a pleasant 88 y.o. Caucasian female, in NAD. AAO x 3.   ?Vascular CFT <3 seconds b/l LE. Faintly palpable pedal pulses b/l. Pedal hair absent. No pain with calf compression b/l. Lower extremity skin temperature gradient within normal limits. Trace edema noted bilateral ankles. Varicosities present b/l. No cyanosis or clubbing noted b/l LE.  ?Neurologic Normal speech. Oriented to person, place, and time. Pt has subjective symptoms of neuropathy. Protective sensation intact 5/5 intact bilaterally with 10g monofilament b/l. Vibratory sensation diminished b/l.  ?Dermatologic Pedal skin thin, shiny and atrophic b/l LE. No open wounds b/l LE. No interdigital macerations noted b/l LE. Toenails 1-5 b/l elongated, discolored, dystrophic, thickened, crumbly with subungual debris and tenderness to dorsal palpation. Hyperkeratotic lesion(s) submet head 1 left foot and  submet head 1 right foot.  No erythema, no edema, no drainage, no fluctuance.  ?Orthopedic: Muscle strength 5/5 to all lower extremity muscle groups bilaterally. Hallux hammertoe noted b/l LE. Hammertoe(s) noted to the bilateral 2nd toes.  ? ?Radiographs: None ? ?Assessment/Plan: ?1. Pain due to onychomycosis of toenails of both feet   ?2. Neuropathy   ?3. Localized edema   ?  ?-Patient was evaluated and treated. All patient's and/or POA's questions/concerns answered on today's visit. ?-We discussed her issues with compression hose and it seems they may be too long with excess gathering around her ankle area. She does not have skin breakdown. She may benefit from custom stockings. Advised daughter to speak to Cardiology nurse for level of compression (in mmHg). ?-Patient to continue soft, supportive shoe gear daily. ?-Toenails 1-5 b/l were debrided in length and girth with sterile nail nippers and dremel without iatrogenic bleeding.  ?-Patient/POA to call should there be question/concern in the interim.  ? ?Return in about 9 weeks (around 07/02/2021). ? ?Marzetta Board, DPM  ?

## 2021-05-15 DIAGNOSIS — F411 Generalized anxiety disorder: Secondary | ICD-10-CM | POA: Diagnosis not present

## 2021-05-15 DIAGNOSIS — F331 Major depressive disorder, recurrent, moderate: Secondary | ICD-10-CM | POA: Diagnosis not present

## 2021-05-22 DIAGNOSIS — F331 Major depressive disorder, recurrent, moderate: Secondary | ICD-10-CM | POA: Diagnosis not present

## 2021-05-22 DIAGNOSIS — F411 Generalized anxiety disorder: Secondary | ICD-10-CM | POA: Diagnosis not present

## 2021-05-28 DIAGNOSIS — F331 Major depressive disorder, recurrent, moderate: Secondary | ICD-10-CM | POA: Diagnosis not present

## 2021-05-28 DIAGNOSIS — F411 Generalized anxiety disorder: Secondary | ICD-10-CM | POA: Diagnosis not present

## 2021-05-29 DIAGNOSIS — F411 Generalized anxiety disorder: Secondary | ICD-10-CM | POA: Diagnosis not present

## 2021-05-29 DIAGNOSIS — F331 Major depressive disorder, recurrent, moderate: Secondary | ICD-10-CM | POA: Diagnosis not present

## 2021-06-16 DIAGNOSIS — R109 Unspecified abdominal pain: Secondary | ICD-10-CM | POA: Diagnosis not present

## 2021-06-16 DIAGNOSIS — E039 Hypothyroidism, unspecified: Secondary | ICD-10-CM | POA: Diagnosis not present

## 2021-06-16 DIAGNOSIS — R197 Diarrhea, unspecified: Secondary | ICD-10-CM | POA: Diagnosis not present

## 2021-06-16 DIAGNOSIS — R110 Nausea: Secondary | ICD-10-CM | POA: Diagnosis not present

## 2021-06-16 DIAGNOSIS — I129 Hypertensive chronic kidney disease with stage 1 through stage 4 chronic kidney disease, or unspecified chronic kidney disease: Secondary | ICD-10-CM | POA: Diagnosis not present

## 2021-07-03 DIAGNOSIS — M810 Age-related osteoporosis without current pathological fracture: Secondary | ICD-10-CM | POA: Diagnosis not present

## 2021-07-03 DIAGNOSIS — G479 Sleep disorder, unspecified: Secondary | ICD-10-CM | POA: Diagnosis not present

## 2021-07-03 DIAGNOSIS — E7800 Pure hypercholesterolemia, unspecified: Secondary | ICD-10-CM | POA: Diagnosis not present

## 2021-07-03 DIAGNOSIS — E039 Hypothyroidism, unspecified: Secondary | ICD-10-CM | POA: Diagnosis not present

## 2021-07-03 DIAGNOSIS — Z0000 Encounter for general adult medical examination without abnormal findings: Secondary | ICD-10-CM | POA: Diagnosis not present

## 2021-07-03 DIAGNOSIS — I129 Hypertensive chronic kidney disease with stage 1 through stage 4 chronic kidney disease, or unspecified chronic kidney disease: Secondary | ICD-10-CM | POA: Diagnosis not present

## 2021-07-03 DIAGNOSIS — Z853 Personal history of malignant neoplasm of breast: Secondary | ICD-10-CM | POA: Diagnosis not present

## 2021-07-03 DIAGNOSIS — I2510 Atherosclerotic heart disease of native coronary artery without angina pectoris: Secondary | ICD-10-CM | POA: Diagnosis not present

## 2021-07-03 DIAGNOSIS — F322 Major depressive disorder, single episode, severe without psychotic features: Secondary | ICD-10-CM | POA: Diagnosis not present

## 2021-07-07 ENCOUNTER — Ambulatory Visit: Payer: Medicare PPO | Admitting: Podiatry

## 2021-07-30 DIAGNOSIS — F411 Generalized anxiety disorder: Secondary | ICD-10-CM | POA: Diagnosis not present

## 2021-07-30 DIAGNOSIS — F331 Major depressive disorder, recurrent, moderate: Secondary | ICD-10-CM | POA: Diagnosis not present

## 2021-08-05 ENCOUNTER — Ambulatory Visit: Payer: Medicare PPO | Admitting: Podiatry

## 2021-08-05 ENCOUNTER — Encounter: Payer: Self-pay | Admitting: Podiatry

## 2021-08-05 DIAGNOSIS — B351 Tinea unguium: Secondary | ICD-10-CM | POA: Diagnosis not present

## 2021-08-05 DIAGNOSIS — G629 Polyneuropathy, unspecified: Secondary | ICD-10-CM | POA: Diagnosis not present

## 2021-08-05 DIAGNOSIS — I5189 Other ill-defined heart diseases: Secondary | ICD-10-CM | POA: Insufficient documentation

## 2021-08-05 DIAGNOSIS — M79675 Pain in left toe(s): Secondary | ICD-10-CM | POA: Diagnosis not present

## 2021-08-05 DIAGNOSIS — M79674 Pain in right toe(s): Secondary | ICD-10-CM | POA: Diagnosis not present

## 2021-08-10 NOTE — Progress Notes (Signed)
  Subjective:  Patient ID: Carmen Haney, female    DOB: 04/26/1930,  MRN: [id number]  Carmen Haney presents to clinic today for at risk foot care with history of peripheral neuropathy and painful elongated mycotic toenails 1-5 bilaterally which are tender when wearing enclosed shoe gear. Pain is relieved with periodic professional debridement.  New problem(s): None.   Her daughter is present during today's visit.  PCP is Kelton Pillar, MD , and last visit was  April 07, 2021  Allergies  Allergen Reactions   Ace Inhibitors Cough   Latex Rash    Review of Systems: Negative except as noted in the HPI.  Objective:  Constitutional Carmen Haney is a pleasant 88 y.o. Caucasian female, in NAD. AAO x 3.   Vascular CFT <3 seconds b/l LE. Faintly palpable pedal pulses b/l. Pedal hair absent. No pain with calf compression b/l. Lower extremity skin temperature gradient within normal limits. Trace edema noted bilateral ankles. Varicosities present b/l. No cyanosis or clubbing noted b/l LE.  Neurologic Normal speech. Oriented to person, place, and time. Pt has subjective symptoms of neuropathy. Protective sensation intact 5/5 intact bilaterally with 10g monofilament b/l. Vibratory sensation diminished b/l.  Dermatologic Pedal skin thin, shiny and atrophic b/l LE. No open wounds b/l LE. No interdigital macerations noted b/l LE. Toenails 1-5 b/l elongated, discolored, dystrophic, thickened, crumbly with subungual debris and tenderness to dorsal palpation.   Orthopedic: Muscle strength 5/5 to all lower extremity muscle groups bilaterally. Hallux hammertoe noted b/l LE. Hammertoe(s) noted to the bilateral 2nd toes.   Radiographs: None  Assessment/Plan: 1. Pain due to onychomycosis of toenails of both feet   2. Neuropathy     -Examined patient. -Patient to continue soft, supportive shoe gear daily. -Mycotic toenails 1-5 bilaterally were debrided in length and girth with sterile nail nippers and  dremel without incident. -Patient/POA to call should there be question/concern in the interim.   Return in about 3 months (around 11/05/2021).  Marzetta Board, DPM

## 2021-08-13 DIAGNOSIS — F331 Major depressive disorder, recurrent, moderate: Secondary | ICD-10-CM | POA: Diagnosis not present

## 2021-08-13 DIAGNOSIS — F411 Generalized anxiety disorder: Secondary | ICD-10-CM | POA: Diagnosis not present

## 2021-09-09 DIAGNOSIS — H6123 Impacted cerumen, bilateral: Secondary | ICD-10-CM | POA: Diagnosis not present

## 2021-09-09 DIAGNOSIS — H60311 Diffuse otitis externa, right ear: Secondary | ICD-10-CM | POA: Diagnosis not present

## 2021-10-15 DIAGNOSIS — F331 Major depressive disorder, recurrent, moderate: Secondary | ICD-10-CM | POA: Diagnosis not present

## 2021-10-15 DIAGNOSIS — F411 Generalized anxiety disorder: Secondary | ICD-10-CM | POA: Diagnosis not present

## 2021-10-28 ENCOUNTER — Telehealth: Payer: Self-pay | Admitting: Cardiology

## 2021-10-28 NOTE — Telephone Encounter (Signed)
Spoke to patient's daughter [name].She stated her mother has been having weakness and muscle pain in both lower legs for the past 2 weeks.Stated she would like appointment.She would like her to have vascular dopplers to check circulation.Stated she does not think pain is coming from Simvastatin.She has been taking for a long time.Appointment scheduled with Fabian Sharp PA 10/25 at 1:55 pm.

## 2021-10-28 NOTE — Telephone Encounter (Signed)
Daughter stated over the last two weeks the patient has been having leg pain and muscle weakness.  Daughter stated the patient has had 5 booster shots.

## 2021-10-30 ENCOUNTER — Ambulatory Visit
Admission: RE | Admit: 2021-10-30 | Discharge: 2021-10-30 | Disposition: A | Discharge status: Home / Self Care | Payer: Medicare PPO | Source: Ambulatory Visit | Attending: Physician Assistant | Admitting: Physician Assistant

## 2021-10-30 ENCOUNTER — Other Ambulatory Visit: Payer: Self-pay | Admitting: Physician Assistant

## 2021-10-30 DIAGNOSIS — R531 Weakness: Secondary | ICD-10-CM | POA: Diagnosis not present

## 2021-10-30 DIAGNOSIS — R109 Unspecified abdominal pain: Secondary | ICD-10-CM

## 2021-10-30 DIAGNOSIS — R194 Change in bowel habit: Secondary | ICD-10-CM | POA: Diagnosis not present

## 2021-10-30 DIAGNOSIS — R600 Localized edema: Secondary | ICD-10-CM | POA: Diagnosis not present

## 2021-10-30 DIAGNOSIS — I5189 Other ill-defined heart diseases: Secondary | ICD-10-CM | POA: Diagnosis not present

## 2021-10-30 DIAGNOSIS — F322 Major depressive disorder, single episode, severe without psychotic features: Secondary | ICD-10-CM | POA: Diagnosis not present

## 2021-10-30 DIAGNOSIS — R197 Diarrhea, unspecified: Secondary | ICD-10-CM | POA: Diagnosis not present

## 2021-10-30 DIAGNOSIS — I480 Paroxysmal atrial fibrillation: Secondary | ICD-10-CM | POA: Diagnosis not present

## 2021-10-30 DIAGNOSIS — R2681 Unsteadiness on feet: Secondary | ICD-10-CM | POA: Diagnosis not present

## 2021-10-30 DIAGNOSIS — K5900 Constipation, unspecified: Secondary | ICD-10-CM | POA: Diagnosis not present

## 2021-10-30 DIAGNOSIS — Z23 Encounter for immunization: Secondary | ICD-10-CM | POA: Diagnosis not present

## 2021-10-30 DIAGNOSIS — E7800 Pure hypercholesterolemia, unspecified: Secondary | ICD-10-CM | POA: Diagnosis not present

## 2021-11-03 NOTE — Progress Notes (Unsigned)
Cardiology Office Note:    Date:  11/05/2021   ID:  Carmen Haney, DOB [birth year], MRN [id number]  PCP:  Kelton Pillar, [place] Providers Cardiologist:  Carmen Ruths, MD     Referring MD: Kelton Pillar, MD   Chief Complaint  Patient presents with   Follow-up    Lower extremity swelling and weakness    History of Present Illness:    Carmen Haney is a 88 y.o. female with a hx of CAD, HTN, A-fib with RVR, thyroid disease, CKD stage III, morbid obesity, HLD, and breast cancer.  Per prior documentation, heart catheterization in 2002 showed 40% left main disease.  Follow-up nuclear stress test were negative for prior infarct or ischemia.  Most recent nuclear stress test for preoperative risk evaluation 11/27/2019 with normal perfusion and EF 69%.  She was hospitalized February/2023 with severe panic attack associated with chest tightness found to be in A-fib RVR.  She reported panic attack over the prior 2 months and she was in counseling.  EMS was dispatched and she was received IV Cardizem.  Cardiology was consulted for admission.  Fortunately she converted to sinus bradycardia shortly after receiving IV Cardizem.  She was started on 25 mg Lopressor but discharged on 12.5 mg Lopressor twice daily for heart rates in the 50s.  Work-up was largely normal including TSH and electrolytes.  She was anticoagulated with 5 mg Eliquis twice daily.  Aspirin was stopped in the setting of Eliquis.  She has had a history of lower extremity edema for which she takes 20 mg Lasix 3 times weekly.  Outpatient echocardiogram following this admission showed preserved EF with grade 2 diastolic dysfunction, biatrial enlargement, no significant valvular disease.  She was doing well on metoprolol and apixaban at follow-up.  Patient's daughter called our office on 10/28/2021 reporting bilateral lower extremity weakness and muscle pain.  They would like her "circulation" checked.  She was added  to my schedule.  She presents today for evaluation.  She is here with her daughter. She has a myriad of complaints. Biggest complaint is chronic pain and lower extremity weakness. She is only taking lasix twice weekly due to severe incontinence.  She does have a bedside commode.  Given the bilateral nature of her lower extremity swelling I have a low suspicion for DVT.  In addition she is on Eliquis.  Daughter states that she has had swelling for many years, but they think that she has had an increase in swelling recently over the last 2 weeks.  No dietary indiscretion noted.  She does report significant leg pain with ambulation.  I think that the main concern is her progressive weakness with walking and that her legs sometimes give out.  I think ultimately she will need to be evaluated by Ortho/neurosurgery.  She does have a cardiac murmur. Reviewed echo, no aortic stenosis.    Past Medical History:  Diagnosis Date   ACE-inhibitor cough    Breast CA [facility])    s/p lumpectomy & radiation   Breast cancer ([place]) 2010   Right Breast Cancer   CAD (coronary artery disease) 07/1998   s/p cath in 2000. L main with 30 to 40%   Hearing loss    HTN (hypertension)    Hyperlipidemia    Obesities, morbid ([place])    Osteoporosis    Personal history of radiation therapy 2010   Right Breast Cancer   Thyroid disease    Vertebral fracture, pathological  Past Surgical History:  Procedure Laterality Date   BREAST BIOPSY Left    BREAST LUMPECTOMY Right March 2010   BREAST LUMPECTOMY WITH RADIOACTIVE SEED LOCALIZATION Left 09/20/2014   Procedure: LEFT BREAST LUMPECTOMY WITH RADIOACTIVE SEED LOCALIZATION;  Surgeon: Autumn Messing III, MD;  Location: [place];  Service: General;  Laterality: Left;   CARPAL TUNNEL RELEASE Right    CESAREAN SECTION     EYE SURGERY     Right Eye   Skin Cancer Removed from Right Forearm     TOTAL ABDOMINAL HYSTERECTOMY      Current Medications: Current Meds   Medication Sig   acetaminophen (TYLENOL) 500 MG tablet Take 1,000 mg by mouth daily.   apixaban (ELIQUIS) 5 MG TABS tablet Take 1 tablet (5 mg total) by mouth 2 (two) times daily.   Calcium Carbonate-Vitamin D (CALCIUM + D PO) Take 2 tablets by mouth daily. gummy   furosemide (LASIX) 20 MG tablet Take 1 tablet (20 mg total) by mouth daily. (Patient taking differently: Take 40 mg by mouth daily. Take 40 mg Twice Weekly)   levothyroxine (SYNTHROID) 125 MCG tablet Take 125 mcg by mouth daily before breakfast.   levothyroxine (SYNTHROID) 150 MCG tablet Take by mouth.   LORazepam (ATIVAN) 0.5 MG tablet Take 0.5 mg by mouth every 8 (eight) hours as needed for anxiety.   metoprolol tartrate (LOPRESSOR) 25 MG tablet Take 0.5 tablets (12.5 mg total) by mouth 2 (two) times daily.   Polyethyl Glycol-Propyl Glycol (SYSTANE OP) Place 2-3 drops into both eyes at bedtime.   polyethylene glycol powder (GLYCOLAX/MIRALAX) 17 GM/SCOOP powder Take 17 g by mouth daily as needed for mild constipation.   Potassium 95 MG TABS Take 95 mg by mouth 2 (two) times a week. Take Twice Weekly on Days When Taking lasix   sertraline (ZOLOFT) 100 MG tablet Take 200 mg by mouth at bedtime.   [DISCONTINUED] simvastatin (ZOCOR) 10 MG tablet Take 10 mg by mouth daily at 6 PM.      Allergies:   Ace inhibitors and Latex   Social History   Socioeconomic History   Marital status: Widowed    Spouse name: Carmen Haney   Number of children: Not on file   Years of education: Not on file   Highest education level: Not on file  Occupational History   Occupation: Retired from [employer]: RETIRED  Tobacco Use   Smoking status: Former    Types: Cigarettes    Quit date: 01/13/1963    Years since quitting: 58.8   Smokeless tobacco: Never   Tobacco comments:    stopped in 1970's  Vaping Use   Vaping Use: Never used  Substance and Sexual Activity   Alcohol use: No    Alcohol/week: 0.0 standard drinks of alcohol   Drug use: No    Sexual activity: Not on file  Other Topics Concern   Not on file  Social History Narrative   Not on file   Social Determinants of Health   Financial Resource Strain: Not on file  Food Insecurity: Not on file  Transportation Needs: Not on file  Physical Activity: Not on file  Stress: Not on file  Social Connections: Not on file     Family History: The patient's family history includes Breast cancer in her mother, sister, and sister; Breast cancer (age of onset: 68) in her daughter; Cancer (age of onset: 20) in her sister; Cancer (age of onset: 47) in her maternal aunt,  mother, and sister; Cancer (age of onset: 32) in her father; Coronary artery disease in her mother; Hypertension in her mother; Melanoma in her mother.  ROS:   Please see the history of present illness.     All other systems reviewed and are negative.  EKGs/Labs/Other Studies Reviewed:    The following studies were reviewed today:  Echo 03/19/21:  1. Left ventricular ejection fraction, by estimation, is 65 to 70%. Left  ventricular ejection fraction by 3D volume is 76 %. The left ventricle has  normal function. The left ventricle has no regional wall motion  abnormalities. Left ventricular diastolic   parameters are consistent with Grade II diastolic dysfunction  (pseudonormalization). Elevated left atrial pressure.   2. Right ventricular systolic function is normal. The right ventricular  size is normal. There is normal pulmonary artery systolic pressure.   3. Left atrial size was moderately dilated.   4. Right atrial size was moderately dilated.   5. The mitral valve is normal in structure. Trivial mitral valve  regurgitation. No evidence of mitral stenosis. The mean mitral valve  gradient is 2.0 mmHg with average heart rate of 51 bpm. Moderate mitral  annular calcification.   6. The aortic valve is normal in structure. Aortic valve regurgitation is  not visualized. No aortic stenosis is present.   7. The  inferior vena cava is normal in size with greater than 50%  respiratory variability, suggesting right atrial pressure of 3 mmHg.   EKG:  EKG is  ordered today.  The ekg ordered today demonstrates sinus bradycardia HR 58, RBBB, LAFB  Recent Labs: 02/15/2021: B Natriuretic Peptide 111.5; Magnesium 2.1; TSH 1.371 02/16/2021: BUN 13; Creatinine, Ser 0.96; Hemoglobin 12.4; Platelets 227; Potassium 3.5; Sodium 137  Recent Lipid Panel No results found for: "CHOL", "TRIG", "HDL", "CHOLHDL", "VLDL", "LDLCALC", "LDLDIRECT"   Risk Assessment/Calculations:    CHA2DS2-VASc Score = 7   This indicates a 11.2% annual risk of stroke. The patient's score is based upon: CHF History: 1 HTN History: 1 Diabetes History: 1 Stroke History: 0 Vascular Disease History: 1 Age Score: 2 Gender Score: 1        Physical Exam:    VS:  BP 112/60   Pulse (!) 58   Ht _0  (1.6 m)   Wt 193 lb 9.6 oz (87.8 kg)   SpO2 98%   BMI 34.29 kg/m     Wt Readings from Last 3 Encounters:  11/05/21 193 lb 9.6 oz (87.8 kg)  02/24/21 197 lb 12.8 oz (89.7 kg)  02/16/21 194 lb 7.1 oz (88.2 kg)     GEN:  Well nourished, well developed in no acute distress HEENT: Normal NECK: No JVD; No carotid bruits LYMPHATICS: No lymphadenopathy CARDIAC: regular rhythm bradycardic rate, systolic murmur heard best at right sternal border RESPIRATORY:  Clear to auscultation without rales, wheezing or rhonchi  ABDOMEN: Soft, non-tender, non-distended MUSCULOSKELETAL:  B LE edema and redness, pedal pulses weak SKIN: Warm and dry NEUROLOGIC:  Alert and oriented x 3 PSYCHIATRIC:  Normal affect   ASSESSMENT:    1. Lower extremity edema   2. Claudication in peripheral vascular disease ([place])   3. Chronic diastolic heart failure ([place])   4. Chronic atrial fibrillation (H[facility])   5. Sinus bradycardia   6. Chronic anticoagulation   7. Essential hypertension   8. Pure hypercholesterolemia    PLAN:    In order of problems listed  above:  PAF Sinus bradycardia Maintained on low-dose metoprolol.  She is  in sinus rhythm today   Chronic anticoagulation CHA2DS2-VASc score 7 Continue Eliquis 5 mg twice daily No bleeding issues, no recent falls   Lower extremity pain and weakness Chronic diastolic heart failure Obtain ABI and arterial dopplers Low suspicion for DVT on eliquis and bilateral nature Higher suspicion for pain and weakness stemming from a spinal issue She does not tolerate compression socks due to her lower leg anatomy I have advised trialing 40 mg of Lasix twice per week.  Recent potassium was 5.0 with normal creatinine.  I advised only taking potassium on the days that she takes Lasix.  She does elevate her legs.   Hypertension No change in medications   CAD Hyperlipidemia with LDL goal less than 70 She has documentation of 40% left main disease, but nonischemic nuclear stress test in 2017 and 2021 No current angina Cardiac enzymes remain negative in the setting of A-fib RVR in February 2023   CKD stage IIIa Baseline serum creatinine 0.9 -recent labs were at baseline           Medication Adjustments/Labs and Tests Ordered: Current medicines are reviewed at length with the patient today.  Concerns regarding medicines are outlined above.  Orders Placed This Encounter  Procedures   EKG 12-Lead   VAS [place] LOWER EXTREMITY ARTERIAL DUPLEX   VAS [place] ABI WITH/WO TBI   No orders of the defined types were placed in this encounter.   Patient Instructions  Medication Instructions:  Stop Simvastatin. Take Lasix 20 mg ( Take 40 mg Twice Weekly). Take Potassium ( Take Twice  Weekly with Lasix). *If you need a refill on your cardiac medications before your next appointment, please call your pharmacy*   Lab Work: No Labs If you have labs (blood work) drawn today and your tests are completely normal, you will receive your results only by: [place] (if you have MyChart) OR A paper copy  in the mail If you have any lab test that is abnormal or we need to change your treatment, we will call you to review the results.   Testing/Procedures: [place], [address]. Your physician has requested that you have an ankle brachial index (ABI). During this test an ultrasound and blood pressure cuff are used to evaluate the arteries that supply the arms and legs with blood. Allow thirty minutes for this exam. There are no restrictions or special instructions.   Your physician has requested that you have a lower or upper extremity arterial duplex. This test is an ultrasound of the arteries in the legs or arms. It looks at arterial blood flow in the legs and arms. Allow one hour for Lower and Upper Arterial scans. There are no restrictions or special instructions   Follow-Up: At [facility], you and your health needs are our priority.  As part of our continuing mission to provide you with exceptional heart care, we have created designated Provider Care Teams.  These Care Teams include your primary Cardiologist (physician) and Advanced Practice Providers (APPs -  Physician Assistants and Nurse Practitioners) who all work together to provide you with the care you need, when you need it.  We recommend signing up for the patient portal called "MyChart".  Sign up information is provided on this After Visit Summary.  MyChart is used to connect with patients for Virtual Visits (Telemedicine).  Patients are able to view lab/test results, encounter notes, upcoming appointments, etc.  Non-urgent messages can be sent to your provider as well.  To learn more about what you can do with MyChart, go to [email or web address].    Your next appointment:   3 month(s)  The format for your next appointment:   In Person  Provider:   Kirk Ruths, MD     Other Instructions Consider [place]  Important Information About Sugar         Signed, Ledora Bottcher, [place]  11/05/2021  4:49 PM    [place]

## 2021-11-05 ENCOUNTER — Ambulatory Visit: Payer: Medicare PPO | Attending: Physician Assistant | Admitting: Physician Assistant

## 2021-11-05 ENCOUNTER — Encounter: Payer: Self-pay | Admitting: Physician Assistant

## 2021-11-05 VITALS — BP 112/60 | HR 58 | Ht 63.0 in | Wt 193.6 lb

## 2021-11-05 DIAGNOSIS — Z7901 Long term (current) use of anticoagulants: Secondary | ICD-10-CM

## 2021-11-05 DIAGNOSIS — I5032 Chronic diastolic (congestive) heart failure: Secondary | ICD-10-CM | POA: Diagnosis not present

## 2021-11-05 DIAGNOSIS — I482 Chronic atrial fibrillation, unspecified: Secondary | ICD-10-CM | POA: Diagnosis not present

## 2021-11-05 DIAGNOSIS — I10 Essential (primary) hypertension: Secondary | ICD-10-CM | POA: Diagnosis not present

## 2021-11-05 DIAGNOSIS — R600 Localized edema: Secondary | ICD-10-CM

## 2021-11-05 DIAGNOSIS — I739 Peripheral vascular disease, unspecified: Secondary | ICD-10-CM | POA: Diagnosis not present

## 2021-11-05 DIAGNOSIS — R001 Bradycardia, unspecified: Secondary | ICD-10-CM

## 2021-11-05 DIAGNOSIS — E7800 Pure hypercholesterolemia, unspecified: Secondary | ICD-10-CM

## 2021-11-05 NOTE — Patient Instructions (Signed)
Medication Instructions:  Stop Simvastatin. Take Lasix 20 mg ( Take 40 mg Twice Weekly). Take Potassium ( Take Twice  Weekly with Lasix). *If you need a refill on your cardiac medications before your next appointment, please call your pharmacy*   Lab Work: No Labs If you have labs (blood work) drawn today and your tests are completely normal, you will receive your results only by: [place] (if you have MyChart) OR A paper copy in the mail If you have any lab test that is abnormal or we need to change your treatment, we will call you to review the results.   Testing/Procedures: [place], [address]. Your physician has requested that you have an ankle brachial index (ABI). During this test an ultrasound and blood pressure cuff are used to evaluate the arteries that supply the arms and legs with blood. Allow thirty minutes for this exam. There are no restrictions or special instructions.   Your physician has requested that you have a lower or upper extremity arterial duplex. This test is an ultrasound of the arteries in the legs or arms. It looks at arterial blood flow in the legs and arms. Allow one hour for Lower and Upper Arterial scans. There are no restrictions or special instructions   Follow-Up: At [facility], you and your health needs are our priority.  As part of our continuing mission to provide you with exceptional heart care, we have created designated Provider Care Teams.  These Care Teams include your primary Cardiologist (physician) and Advanced Practice Providers (APPs -  Physician Assistants and Nurse Practitioners) who all work together to provide you with the care you need, when you need it.  We recommend signing up for the patient portal called "MyChart".  Sign up information is provided on this After Visit Summary.  MyChart is used to connect with patients for Virtual Visits (Telemedicine).  Patients are able to view lab/test results, encounter  notes, upcoming appointments, etc.  Non-urgent messages can be sent to your provider as well.   To learn more about what you can do with MyChart, go to [email or web address].    Your next appointment:   3 month(s)  The format for your next appointment:   In Person  Provider:   Kirk Ruths, MD     Other Instructions Consider [place] Gum  Important Information About Sugar

## 2021-11-06 DIAGNOSIS — I130 Hypertensive heart and chronic kidney disease with heart failure and stage 1 through stage 4 chronic kidney disease, or unspecified chronic kidney disease: Secondary | ICD-10-CM | POA: Diagnosis not present

## 2021-11-06 DIAGNOSIS — I5030 Unspecified diastolic (congestive) heart failure: Secondary | ICD-10-CM | POA: Diagnosis not present

## 2021-11-06 DIAGNOSIS — N183 Chronic kidney disease, stage 3 unspecified: Secondary | ICD-10-CM | POA: Diagnosis not present

## 2021-11-06 DIAGNOSIS — M1990 Unspecified osteoarthritis, unspecified site: Secondary | ICD-10-CM | POA: Diagnosis not present

## 2021-11-06 DIAGNOSIS — G8929 Other chronic pain: Secondary | ICD-10-CM | POA: Diagnosis not present

## 2021-11-06 DIAGNOSIS — I2510 Atherosclerotic heart disease of native coronary artery without angina pectoris: Secondary | ICD-10-CM | POA: Diagnosis not present

## 2021-11-06 DIAGNOSIS — F419 Anxiety disorder, unspecified: Secondary | ICD-10-CM | POA: Diagnosis not present

## 2021-11-06 DIAGNOSIS — F322 Major depressive disorder, single episode, severe without psychotic features: Secondary | ICD-10-CM | POA: Diagnosis not present

## 2021-11-06 DIAGNOSIS — I480 Paroxysmal atrial fibrillation: Secondary | ICD-10-CM | POA: Diagnosis not present

## 2021-11-17 ENCOUNTER — Encounter: Payer: Self-pay | Admitting: Podiatry

## 2021-11-17 ENCOUNTER — Ambulatory Visit: Payer: Medicare PPO | Admitting: Podiatry

## 2021-11-17 DIAGNOSIS — M79675 Pain in left toe(s): Secondary | ICD-10-CM | POA: Diagnosis not present

## 2021-11-17 DIAGNOSIS — L84 Corns and callosities: Secondary | ICD-10-CM

## 2021-11-17 DIAGNOSIS — B351 Tinea unguium: Secondary | ICD-10-CM

## 2021-11-17 DIAGNOSIS — M79674 Pain in right toe(s): Secondary | ICD-10-CM

## 2021-11-17 DIAGNOSIS — G629 Polyneuropathy, unspecified: Secondary | ICD-10-CM | POA: Diagnosis not present

## 2021-11-19 ENCOUNTER — Ambulatory Visit (HOSPITAL_COMMUNITY)
Admission: RE | Admit: 2021-11-19 | Discharge: 2021-11-19 | Disposition: A | Discharge status: Home / Self Care | Payer: Medicare PPO | Source: Ambulatory Visit | Attending: Physician Assistant | Admitting: Physician Assistant

## 2021-11-19 DIAGNOSIS — M79604 Pain in right leg: Secondary | ICD-10-CM

## 2021-11-19 DIAGNOSIS — R600 Localized edema: Secondary | ICD-10-CM | POA: Diagnosis not present

## 2021-11-19 DIAGNOSIS — M79605 Pain in left leg: Secondary | ICD-10-CM | POA: Diagnosis not present

## 2021-11-21 NOTE — Progress Notes (Signed)
  Subjective:  Patient ID: Carmen Haney, female    DOB: November 07, 1930,  MRN: [id number]  Carmen Haney presents to clinic today for at risk foot care with history of peripheral neuropathy and callus(es) b/l lower extremities and painful thick toenails that are difficult to trim. Painful toenails interfere with ambulation. Aggravating factors include wearing enclosed shoe gear. Pain is relieved with periodic professional debridement. Painful calluses are aggravated when weightbearing with and without shoegear. Pain is relieved with periodic professional debridement.   Her daughter is present on today's visit. She is scheduled to have ABIs performed. Chief Complaint  Patient presents with   Nail Problem    RFC Bilateral nail trim 1-5   Callouses    Bilateral callus x 2 medial side of feet.    New problem(s): None.   PCP is Kelton Pillar, MD  Allergies  Allergen Reactions   Ace Inhibitors Cough   Latex Rash    Review of Systems: Negative except as noted in the HPI.  Objective:   Carmen Haney is a pleasant 88 y.o. female in NAD. AAO x 3. Vascular CFT <3 seconds b/l LE. Faintly palpable pedal pulses b/l. Pedal hair absent. No pain with calf compression b/l. Lower extremity skin temperature gradient within normal limits. Chronic lower extremity edema noted bilateral ankles. Varicosities present b/l. No cyanosis or clubbing noted b/l LE.  Neurologic Normal speech. Oriented to person, place, and time. Pt has subjective symptoms of neuropathy. Protective sensation intact 5/5 intact bilaterally with 10g monofilament b/l. Vibratory sensation diminished b/l.  Dermatologic Pedal skin thin, shiny and atrophic b/l LE. No open wounds b/l LE. No interdigital macerations noted b/l LE. Toenails 1-5 b/l elongated, discolored, dystrophic, thickened, crumbly with subungual debris and tenderness to dorsal palpation. Hyperkeratotic lesion(s) 1st metatarsal head b/l lower extremities.  No erythema, no edema,  no drainage, no fluctuance.  Orthopedic: Muscle strength 5/5 to all lower extremity muscle groups bilaterally. Hallux hammertoe noted b/l LE. Hammertoe(s) noted to the bilateral 2nd toes.   Radiographs: None Assessment/Plan: 1. Pain due to onychomycosis of toenails of both feet   2. Callus   3. Neuropathy     No orders of the defined types were placed in this encounter.   -Patient's family member present. All questions/concerns addressed on today's visit. -Continue supportive shoe gear daily. -Toenails 1-5 b/l were debrided in length and girth with sterile nail nippers and dremel without iatrogenic bleeding.  -Callus(es) 1st metatarsal head b/l lower extremities pared utilizing rotary bur without complication or incident. Total number pared =2. -Patient/POA to call should there be question/concern in the interim.   Return in about 3 months (around 02/17/2022).  Marzetta Board, DPM

## 2021-11-24 DIAGNOSIS — F329 Major depressive disorder, single episode, unspecified: Secondary | ICD-10-CM | POA: Diagnosis not present

## 2021-11-24 DIAGNOSIS — F411 Generalized anxiety disorder: Secondary | ICD-10-CM | POA: Diagnosis not present

## 2021-11-26 ENCOUNTER — Other Ambulatory Visit: Payer: Self-pay

## 2021-11-26 ENCOUNTER — Encounter (HOSPITAL_BASED_OUTPATIENT_CLINIC_OR_DEPARTMENT_OTHER): Payer: Self-pay

## 2021-11-26 ENCOUNTER — Inpatient Hospital Stay (HOSPITAL_COMMUNITY): Payer: Medicare PPO

## 2021-11-26 ENCOUNTER — Encounter (HOSPITAL_COMMUNITY): Payer: Self-pay

## 2021-11-26 ENCOUNTER — Emergency Department (HOSPITAL_BASED_OUTPATIENT_CLINIC_OR_DEPARTMENT_OTHER): Payer: Medicare PPO

## 2021-11-26 ENCOUNTER — Emergency Department (HOSPITAL_BASED_OUTPATIENT_CLINIC_OR_DEPARTMENT_OTHER): Payer: Medicare PPO | Admitting: Radiology

## 2021-11-26 ENCOUNTER — Inpatient Hospital Stay (HOSPITAL_BASED_OUTPATIENT_CLINIC_OR_DEPARTMENT_OTHER)
Admission: EM | Admit: 2021-11-26 | Discharge: 2021-12-01 | DRG: 563 | Disposition: A | Discharge status: Skilled Nursing Facility | Payer: Medicare PPO | Attending: Internal Medicine | Admitting: Internal Medicine

## 2021-11-26 DIAGNOSIS — M6281 Muscle weakness (generalized): Secondary | ICD-10-CM | POA: Diagnosis not present

## 2021-11-26 DIAGNOSIS — I4891 Unspecified atrial fibrillation: Secondary | ICD-10-CM | POA: Diagnosis not present

## 2021-11-26 DIAGNOSIS — W19XXXA Unspecified fall, initial encounter: Secondary | ICD-10-CM | POA: Diagnosis not present

## 2021-11-26 DIAGNOSIS — I110 Hypertensive heart disease with heart failure: Secondary | ICD-10-CM | POA: Diagnosis present

## 2021-11-26 DIAGNOSIS — Z90710 Acquired absence of both cervix and uterus: Secondary | ICD-10-CM

## 2021-11-26 DIAGNOSIS — Z8042 Family history of malignant neoplasm of prostate: Secondary | ICD-10-CM

## 2021-11-26 DIAGNOSIS — M25462 Effusion, left knee: Secondary | ICD-10-CM | POA: Diagnosis not present

## 2021-11-26 DIAGNOSIS — Z803 Family history of malignant neoplasm of breast: Secondary | ICD-10-CM

## 2021-11-26 DIAGNOSIS — M4854XA Collapsed vertebra, not elsewhere classified, thoracic region, initial encounter for fracture: Secondary | ICD-10-CM | POA: Diagnosis present

## 2021-11-26 DIAGNOSIS — E785 Hyperlipidemia, unspecified: Secondary | ICD-10-CM | POA: Diagnosis present

## 2021-11-26 DIAGNOSIS — R278 Other lack of coordination: Secondary | ICD-10-CM | POA: Diagnosis not present

## 2021-11-26 DIAGNOSIS — Y92092 Bedroom in other non-institutional residence as the place of occurrence of the external cause: Secondary | ICD-10-CM | POA: Diagnosis not present

## 2021-11-26 DIAGNOSIS — Z6834 Body mass index (BMI) 34.0-34.9, adult: Secondary | ICD-10-CM

## 2021-11-26 DIAGNOSIS — M545 Low back pain, unspecified: Secondary | ICD-10-CM | POA: Diagnosis not present

## 2021-11-26 DIAGNOSIS — S82831S Other fracture of upper and lower end of right fibula, sequela: Secondary | ICD-10-CM | POA: Diagnosis not present

## 2021-11-26 DIAGNOSIS — Z79890 Hormone replacement therapy (postmenopausal): Secondary | ICD-10-CM

## 2021-11-26 DIAGNOSIS — Z923 Personal history of irradiation: Secondary | ICD-10-CM

## 2021-11-26 DIAGNOSIS — I5189 Other ill-defined heart diseases: Secondary | ICD-10-CM | POA: Diagnosis not present

## 2021-11-26 DIAGNOSIS — S82454A Nondisplaced comminuted fracture of shaft of right fibula, initial encounter for closed fracture: Secondary | ICD-10-CM | POA: Diagnosis not present

## 2021-11-26 DIAGNOSIS — R39198 Other difficulties with micturition: Secondary | ICD-10-CM | POA: Diagnosis not present

## 2021-11-26 DIAGNOSIS — Z98891 History of uterine scar from previous surgery: Secondary | ICD-10-CM

## 2021-11-26 DIAGNOSIS — R296 Repeated falls: Secondary | ICD-10-CM | POA: Diagnosis present

## 2021-11-26 DIAGNOSIS — R911 Solitary pulmonary nodule: Secondary | ICD-10-CM | POA: Diagnosis present

## 2021-11-26 DIAGNOSIS — J439 Emphysema, unspecified: Secondary | ICD-10-CM | POA: Diagnosis present

## 2021-11-26 DIAGNOSIS — F32A Depression, unspecified: Secondary | ICD-10-CM | POA: Diagnosis present

## 2021-11-26 DIAGNOSIS — R2689 Other abnormalities of gait and mobility: Secondary | ICD-10-CM | POA: Diagnosis not present

## 2021-11-26 DIAGNOSIS — Z9181 History of falling: Secondary | ICD-10-CM

## 2021-11-26 DIAGNOSIS — M5136 Other intervertebral disc degeneration, lumbar region: Secondary | ICD-10-CM | POA: Diagnosis not present

## 2021-11-26 DIAGNOSIS — M7989 Other specified soft tissue disorders: Secondary | ICD-10-CM | POA: Diagnosis not present

## 2021-11-26 DIAGNOSIS — Z7901 Long term (current) use of anticoagulants: Secondary | ICD-10-CM | POA: Diagnosis not present

## 2021-11-26 DIAGNOSIS — L304 Erythema intertrigo: Secondary | ICD-10-CM | POA: Diagnosis not present

## 2021-11-26 DIAGNOSIS — I5032 Chronic diastolic (congestive) heart failure: Secondary | ICD-10-CM | POA: Diagnosis present

## 2021-11-26 DIAGNOSIS — I10 Essential (primary) hypertension: Secondary | ICD-10-CM | POA: Diagnosis present

## 2021-11-26 DIAGNOSIS — I480 Paroxysmal atrial fibrillation: Secondary | ICD-10-CM | POA: Diagnosis not present

## 2021-11-26 DIAGNOSIS — Z853 Personal history of malignant neoplasm of breast: Secondary | ICD-10-CM

## 2021-11-26 DIAGNOSIS — E039 Hypothyroidism, unspecified: Secondary | ICD-10-CM | POA: Diagnosis not present

## 2021-11-26 DIAGNOSIS — Z85828 Personal history of other malignant neoplasm of skin: Secondary | ICD-10-CM

## 2021-11-26 DIAGNOSIS — E669 Obesity, unspecified: Secondary | ICD-10-CM | POA: Diagnosis present

## 2021-11-26 DIAGNOSIS — S22080A Wedge compression fracture of T11-T12 vertebra, initial encounter for closed fracture: Secondary | ICD-10-CM

## 2021-11-26 DIAGNOSIS — S82831A Other fracture of upper and lower end of right fibula, initial encounter for closed fracture: Principal | ICD-10-CM | POA: Diagnosis present

## 2021-11-26 DIAGNOSIS — H9190 Unspecified hearing loss, unspecified ear: Secondary | ICD-10-CM | POA: Diagnosis present

## 2021-11-26 DIAGNOSIS — T1490XA Injury, unspecified, initial encounter: Secondary | ICD-10-CM | POA: Diagnosis not present

## 2021-11-26 DIAGNOSIS — M791 Myalgia, unspecified site: Secondary | ICD-10-CM | POA: Diagnosis not present

## 2021-11-26 DIAGNOSIS — M170 Bilateral primary osteoarthritis of knee: Secondary | ICD-10-CM | POA: Diagnosis not present

## 2021-11-26 DIAGNOSIS — I2510 Atherosclerotic heart disease of native coronary artery without angina pectoris: Secondary | ICD-10-CM | POA: Diagnosis present

## 2021-11-26 DIAGNOSIS — M8448XA Pathological fracture, other site, initial encounter for fracture: Secondary | ICD-10-CM | POA: Diagnosis not present

## 2021-11-26 DIAGNOSIS — M25571 Pain in right ankle and joints of right foot: Secondary | ICD-10-CM | POA: Diagnosis not present

## 2021-11-26 DIAGNOSIS — L821 Other seborrheic keratosis: Secondary | ICD-10-CM | POA: Diagnosis not present

## 2021-11-26 DIAGNOSIS — Z91040 Latex allergy status: Secondary | ICD-10-CM

## 2021-11-26 DIAGNOSIS — S299XXA Unspecified injury of thorax, initial encounter: Secondary | ICD-10-CM | POA: Diagnosis not present

## 2021-11-26 DIAGNOSIS — F419 Anxiety disorder, unspecified: Secondary | ICD-10-CM | POA: Diagnosis present

## 2021-11-26 DIAGNOSIS — M25511 Pain in right shoulder: Secondary | ICD-10-CM | POA: Diagnosis not present

## 2021-11-26 DIAGNOSIS — Z888 Allergy status to other drugs, medicaments and biological substances status: Secondary | ICD-10-CM

## 2021-11-26 DIAGNOSIS — Z043 Encounter for examination and observation following other accident: Secondary | ICD-10-CM | POA: Diagnosis not present

## 2021-11-26 DIAGNOSIS — Z808 Family history of malignant neoplasm of other organs or systems: Secondary | ICD-10-CM

## 2021-11-26 DIAGNOSIS — Z87891 Personal history of nicotine dependence: Secondary | ICD-10-CM

## 2021-11-26 DIAGNOSIS — W010XXA Fall on same level from slipping, tripping and stumbling without subsequent striking against object, initial encounter: Secondary | ICD-10-CM | POA: Diagnosis present

## 2021-11-26 DIAGNOSIS — Z8249 Family history of ischemic heart disease and other diseases of the circulatory system: Secondary | ICD-10-CM | POA: Diagnosis not present

## 2021-11-26 DIAGNOSIS — Z79899 Other long term (current) drug therapy: Secondary | ICD-10-CM

## 2021-11-26 DIAGNOSIS — D1801 Hemangioma of skin and subcutaneous tissue: Secondary | ICD-10-CM | POA: Diagnosis not present

## 2021-11-26 DIAGNOSIS — M75101 Unspecified rotator cuff tear or rupture of right shoulder, not specified as traumatic: Secondary | ICD-10-CM | POA: Diagnosis present

## 2021-11-26 DIAGNOSIS — M25561 Pain in right knee: Secondary | ICD-10-CM | POA: Diagnosis present

## 2021-11-26 DIAGNOSIS — M25562 Pain in left knee: Secondary | ICD-10-CM | POA: Diagnosis not present

## 2021-11-26 DIAGNOSIS — Z87311 Personal history of (healed) other pathological fracture: Secondary | ICD-10-CM

## 2021-11-26 DIAGNOSIS — M810 Age-related osteoporosis without current pathological fracture: Secondary | ICD-10-CM | POA: Diagnosis present

## 2021-11-26 DIAGNOSIS — R55 Syncope and collapse: Secondary | ICD-10-CM | POA: Diagnosis not present

## 2021-11-26 DIAGNOSIS — S0990XA Unspecified injury of head, initial encounter: Secondary | ICD-10-CM | POA: Diagnosis not present

## 2021-11-26 DIAGNOSIS — G2581 Restless legs syndrome: Secondary | ICD-10-CM | POA: Diagnosis not present

## 2021-11-26 DIAGNOSIS — R600 Localized edema: Secondary | ICD-10-CM

## 2021-11-26 HISTORY — DX: Paroxysmal atrial fibrillation: I48.0

## 2021-11-26 HISTORY — DX: Chronic diastolic (congestive) heart failure: I50.32

## 2021-11-26 LAB — COMPREHENSIVE METABOLIC PANEL
ALT: 11 U/L (ref 0–44)
AST: 15 U/L (ref 15–41)
Albumin: 3.9 g/dL (ref 3.5–5.0)
Alkaline Phosphatase: 68 U/L (ref 38–126)
Anion gap: 6 (ref 5–15)
BUN: 16 mg/dL (ref 8–23)
CO2: 30 mmol/L (ref 22–32)
Calcium: 8.9 mg/dL (ref 8.9–10.3)
Chloride: 99 mmol/L (ref 98–111)
Creatinine, Ser: 0.73 mg/dL (ref 0.44–1.00)
GFR, Estimated: >60 mL/min (ref >60)
Glucose, Bld: 117 mg/dL — ABNORMAL HIGH (ref 70–99)
Potassium: 4.6 mmol/L (ref 3.5–5.1)
Sodium: 135 mmol/L (ref 135–145)
Total Bilirubin: 0.7 mg/dL (ref 0.3–1.2)
Total Protein: 6.7 g/dL (ref 6.5–8.1)

## 2021-11-26 LAB — CBC
HCT: 38.1 % (ref 36.0–46.0)
Hemoglobin: 12.6 g/dL (ref 12.0–15.0)
MCH: 30.6 pg (ref 26.0–34.0)
MCHC: 33.1 g/dL (ref 30.0–36.0)
MCV: 92.5 fL (ref 80.0–100.0)
Platelets: 232 10*3/uL (ref 150–400)
RBC: 4.12 MIL/uL (ref 3.87–5.11)
RDW: 14.2 % (ref 11.5–15.5)
WBC: 7.2 10*3/uL (ref 4.0–10.5)
nRBC: 0 % (ref 0.0–0.2)

## 2021-11-26 MED ORDER — POLYVINYL ALCOHOL 1.4 % OP SOLN
1.0000 [drp] | Freq: Every day | OPHTHALMIC | Status: DC
  Administered 2021-11-27 – 2021-11-30 (×4): 1 [drp] via OPHTHALMIC
  Filled 2021-11-26: qty 15

## 2021-11-26 MED ORDER — SERTRALINE HCL 25 MG PO TABS
200.0000 mg | ORAL_TABLET | Freq: Every day | ORAL | Status: DC
  Administered 2021-11-27 – 2021-11-30 (×4): 200 mg via ORAL
  Filled 2021-11-26 (×4): qty 8

## 2021-11-26 MED ORDER — ACETAMINOPHEN 500 MG PO TABS
1000.0000 mg | ORAL_TABLET | Freq: Once | ORAL | Status: AC
  Administered 2021-11-26: 1000 mg via ORAL
  Filled 2021-11-26: qty 2

## 2021-11-26 MED ORDER — POLYETHYL GLYCOL-PROPYL GLYCOL 0.4-0.3 % OP GEL: Freq: Every day | OPHTHALMIC | Status: DC

## 2021-11-26 MED ORDER — HYDRALAZINE HCL 20 MG/ML IJ SOLN: 5.0000 mg | INTRAMUSCULAR | Status: DC | PRN

## 2021-11-26 MED ORDER — BUPIVACAINE HCL (PF) 0.5 % IJ SOLN
10.0000 mL | Freq: Once | INTRAMUSCULAR | Status: DC
  Filled 2021-11-26: qty 10

## 2021-11-26 MED ORDER — APIXABAN 2.5 MG PO TABS
5.0000 mg | ORAL_TABLET | Freq: Two times a day (BID) | ORAL | Status: DC
  Administered 2021-11-27 – 2021-12-01 (×9): 5 mg via ORAL
  Filled 2021-11-26 (×9): qty 2

## 2021-11-26 MED ORDER — LEVOTHYROXINE SODIUM 25 MCG PO TABS
150.0000 ug | ORAL_TABLET | Freq: Every day | ORAL | Status: DC
  Administered 2021-11-27 – 2021-12-01 (×5): 150 ug via ORAL
  Filled 2021-11-26 (×5): qty 2

## 2021-11-26 MED ORDER — ACETAMINOPHEN 325 MG PO TABS
650.0000 mg | ORAL_TABLET | Freq: Four times a day (QID) | ORAL | Status: DC | PRN
  Administered 2021-11-26 – 2021-11-30 (×8): 650 mg via ORAL
  Filled 2021-11-26 (×8): qty 2

## 2021-11-26 MED ORDER — MORPHINE SULFATE (PF) 2 MG/ML IV SOLN: 2.0000 mg | INTRAVENOUS | Status: DC | PRN

## 2021-11-26 MED ORDER — POLYETHYLENE GLYCOL 3350 17 G PO PACK
17.0000 g | PACK | Freq: Every day | ORAL | Status: DC | PRN
  Filled 2021-11-26: qty 1

## 2021-11-26 MED ORDER — OXYCODONE HCL 5 MG PO TABS
5.0000 mg | ORAL_TABLET | Freq: Once | ORAL | Status: AC
  Administered 2021-11-26: 5 mg via ORAL
  Filled 2021-11-26: qty 1

## 2021-11-26 MED ORDER — ACETAMINOPHEN 650 MG RE SUPP: 650.0000 mg | Freq: Four times a day (QID) | RECTAL | Status: DC | PRN

## 2021-11-26 MED ORDER — BISACODYL 5 MG PO TBEC: 5.0000 mg | DELAYED_RELEASE_TABLET | Freq: Every day | ORAL | Status: DC | PRN

## 2021-11-26 MED ORDER — LORAZEPAM 1 MG PO TABS
0.5000 mg | ORAL_TABLET | Freq: Three times a day (TID) | ORAL | Status: DC | PRN
  Filled 2021-11-26: qty 1

## 2021-11-26 MED ORDER — OXYCODONE HCL 5 MG PO TABS
5.0000 mg | ORAL_TABLET | ORAL | Status: DC | PRN
  Administered 2021-11-26 – 2021-11-29 (×2): 5 mg via ORAL
  Filled 2021-11-26 (×3): qty 1

## 2021-11-26 MED ORDER — ONDANSETRON HCL 4 MG/2ML IJ SOLN: 4.0000 mg | Freq: Four times a day (QID) | INTRAMUSCULAR | Status: DC | PRN

## 2021-11-26 MED ORDER — METOPROLOL TARTRATE 25 MG PO TABS
12.5000 mg | ORAL_TABLET | Freq: Two times a day (BID) | ORAL | Status: DC
  Administered 2021-11-27 – 2021-12-01 (×9): 12.5 mg via ORAL
  Filled 2021-11-26 (×9): qty 1

## 2021-11-26 MED ORDER — ONDANSETRON HCL 4 MG PO TABS: 4.0000 mg | ORAL_TABLET | Freq: Four times a day (QID) | ORAL | Status: DC | PRN

## 2021-11-26 MED ORDER — METHYLPREDNISOLONE ACETATE 40 MG/ML IJ SUSP
40.0000 mg | Freq: Once | INTRAMUSCULAR | Status: DC
  Filled 2021-11-26: qty 1

## 2021-11-26 MED ORDER — DOCUSATE SODIUM 100 MG PO CAPS
100.0000 mg | ORAL_CAPSULE | Freq: Two times a day (BID) | ORAL | Status: DC
  Administered 2021-11-26 – 2021-11-28 (×4): 100 mg via ORAL
  Filled 2021-11-26 (×5): qty 1

## 2021-11-26 NOTE — Plan of Care (Signed)

## 2021-11-26 NOTE — ED Triage Notes (Signed)
Pt arrives pov, to triage in wheelchair, endorses fall last night when getting out of bed. Reports bilateral LE weakness x 3 weeks.  Endorses thinners. Pt c/o RT arm pain, bilaterl neck pain, lower back pain, bilateral knee pain. Pt is AO x 4. Pt reports hitting head on mattress, denies loc

## 2021-11-26 NOTE — Assessment & Plan Note (Signed)
-  Continue Synthroid at current dose for now

## 2021-11-26 NOTE — Assessment & Plan Note (Signed)
-  Patient with a fall at home and diffuse pain -Imaging shows a R proximal fibular fracture; knee immobilizer in place -She has h/o compression fractures and has a new acute one presumably from the fall; her back is not currently painful to her -She also complains of recent L knee pain as well as diffuse R shoulder/arm pain; will order more xrays -Patient is unable to ambulate and so cannot go home -Will admit to med surg -Orthopedics has been consulted; patient is requesting a transition to Dr. Mardelle Matte and his team will see the patient tomorrow -Current issues appear to be non-operative -Will need PT/OT consults -She may need placement depending on progress -TOC team also consulted

## 2021-11-26 NOTE — H&P (Signed)
History and Physical      Carmen Haney ERX:[id number] DOB: 03-28-1930 DOA: 11/26/2021  PCP: Kelton Pillar, MD *** Patient coming from: home ***  I have personally briefly reviewed patient's old medical records in [place]  Chief Complaint: ***  HPI: Carmen Haney is a 88 y.o. female with medical history significant for *** who is admitted to [facility] on 11/26/2021 with *** after presenting from home*** to [facility] ED complaining of ***.    ***    ***SOB: Denies any associated orthopnea, PND, or new onset peripheral edema. No recent chest pain, diaphoresis, palpitations, N/V, pre-syncope, or syncope. Not associated with any recent cough, wheezing, hemoptysis, new lower extremity erythema, or calf tenderness. Denies any recent trauma, travel, surgical procedures, or periods of prolonged diminished ambulatory status. No recent melena or hematochezia.   Denies any associated subjective fever, chills, rigors, or generalized myalgias. No recent headache, neck stiffness, rhinitis, rhinorrhea, sore throat, abdominal pain, diarrhea, or rash. No known recent COVID-19 exposures. Denies dysuria, gross hematuria, or change in urinary urgency/frequency.  ***   ***misc/infectious: Denies any subjective fever, chills, rigors, or generalized myalgias. Denies any recent headache, neck stiffness, rhinitis, rhinorrhea, sore throat, sob, wheezing, cough, nausea, vomiting, abdominal pain, diarrhea, or rash. No recent traveling or known COVID-19 exposures. Denies dysuria, gross hematuria, or change in urinary urgency/frequency.  Denies any recent chest pain, diaphoresis, or palpitations. ***    ED Course:  Vital signs in the ED were notable for the following: ***  Labs were notable for the following: ***  Per my interpretation, EKG in ED demonstrated the following:  ***  Imaging and additional notable ED work-up: ***  While in the ED, the following were administered:  ***  Subsequently, the patient was admitted  ***  ***red   Review of Systems: As per HPI otherwise 10 point review of systems negative.   Past Medical History:  Diagnosis Date   ACE-inhibitor cough    Breast CA ([place]) 2010   s/p lumpectomy & radiation   CAD (coronary artery disease) 07/1998   s/p cath in 2000. L [place] with 30 to 40%   Hearing loss    HTN (hypertension)    Hyperlipidemia    Obesities, morbid ([place])    Osteoporosis    Personal history of radiation therapy 2010   Right Breast Cancer   Thyroid disease    Vertebral fracture, pathological     Past Surgical History:  Procedure Laterality Date   BREAST BIOPSY Left    BREAST LUMPECTOMY Right March 2010   BREAST LUMPECTOMY WITH RADIOACTIVE SEED LOCALIZATION Left 09/20/2014   Procedure: LEFT BREAST LUMPECTOMY WITH RADIOACTIVE SEED LOCALIZATION;  Surgeon: Autumn Messing III, MD;  Location: [facility];  Service: General;  Laterality: Left;   CARPAL TUNNEL RELEASE Right    CESAREAN SECTION     EYE SURGERY     Right Eye   Skin Cancer Removed from Right Forearm     TOTAL ABDOMINAL HYSTERECTOMY      Social History:  reports that she quit smoking about 58 years ago. Her smoking use included cigarettes. She has never used smokeless tobacco. She reports that she does not drink alcohol and does not use drugs.   Allergies  Allergen Reactions   Ace Inhibitors Cough   Latex Rash    Family History  Problem Relation Age of Onset   Coronary artery disease Mother    Hypertension Mother    Melanoma Mother  Cancer Mother 80       breast   Breast cancer Mother    Cancer Father 38       prostate   Cancer Sister 17       breast   Breast cancer Sister    Cancer Maternal Aunt 67       breast   Cancer Sister 29       breast   Breast cancer Sister    Breast cancer Daughter 92    Family history reviewed and not pertinent ***   Prior to Admission medications   Medication Sig Start Date End Date Taking?  Authorizing Provider  acetaminophen (TYLENOL) 500 MG tablet Take 1,000 mg by mouth every 8 (eight) hours as needed for mild pain or headache.   Yes [provider]  apixaban (ELIQUIS) 5 MG TABS tablet Take 1 tablet (5 mg total) by mouth 2 (two) times daily. 02/16/21  Yes Duke, Tami Lin, PA  Calcium-Phosphorus-Vitamin D (CALCIUM/D3 ADULT GUMMIES PO) Take 2 tablets by mouth in the morning.   Yes [provider]  furosemide (LASIX) 20 MG tablet Take 1 tablet (20 mg total) by mouth daily. Patient taking differently: Take 20 mg by mouth in the morning. 05/02/21  Yes Crenshaw, Denice Bors, MD  levothyroxine (SYNTHROID) 150 MCG tablet Take 150 mcg by mouth daily before breakfast. 04/07/21  Yes [provider]  LORazepam (ATIVAN) 0.5 MG tablet Take 0.5 mg by mouth every 8 (eight) hours as needed for anxiety.   Yes [provider]  MAGNESIUM PO Take 20 mLs by mouth at bedtime.   Yes [provider]  metoprolol tartrate (LOPRESSOR) 25 MG tablet Take 0.5 tablets (12.5 mg total) by mouth 2 (two) times daily. 02/16/21  Yes Duke, Tami Lin, PA  polyethylene glycol powder (GLYCOLAX/MIRALAX) 17 GM/SCOOP powder Take 17 g by mouth daily as needed for mild constipation.   Yes [provider]  potassium chloride (KLOR-CON) 10 MEQ tablet Take 10 mEq by mouth in the morning.   Yes [provider]  Probiotic Product (PROBIOTIC GUMMIES PO) Take 2 tablets by mouth in the morning.   Yes [provider]  sertraline (ZOLOFT) 100 MG tablet Take 200 mg by mouth at bedtime. 10/29/15  Yes [provider]  Polyethyl Glycol-Propyl Glycol (SYSTANE OP) Place 2-3 drops into both eyes at bedtime. Patient not taking: Reported on 11/26/2021    [provider]  olmesartan (BENICAR) 20 MG tablet Take 20 mg by mouth daily.    04/25/10  [provider]     Objective    Physical Exam: Vitals:   11/26/21 1645 11/26/21 1700 11/26/21 1850  11/26/21 2155  BP: 131/65 132/62 119/89 (!) 115/54  Pulse: 75 70 65 66  Resp: 16 (!) '22 19 14  '$ Temp: 99.3 F (37.4 C)  100 F (37.8 C) 98.8 F (37.1 C)  TempSrc:   Oral Oral  SpO2: 94% 95% 97% 95%  Weight:      Height:        General: appears to be stated age; alert, oriented Skin: warm, dry, no rash Head:  AT/[place] Mouth:  Oral mucosa membranes appear moist, normal dentition Neck: supple; trachea midline Heart:  RRR; did not appreciate any M/R/G Lungs: CTAB, did not appreciate any wheezes, rales, or rhonchi Abdomen: + BS; soft, ND, NT Vascular: 2+ pedal pulses b/l; 2+ radial pulses b/l Extremities: no peripheral edema, no muscle wasting Neuro: strength and sensation intact in upper and lower extremities  b/l    *** Neuro: 5/5 strength of the proximal and distal flexors and extensors of the upper and lower extremities bilaterally; sensation intact in upper and lower extremities b/l; cranial nerves II through XII grossly intact; no pronator drift; no evidence suggestive of slurred speech, dysarthria, or facial droop; Normal muscle tone. No tremors. *** Neuro: In the setting of the patient's current mental status and associated inability to follow instructions, unable to perform full neurologic exam at this time.  As such, assessment of strength, sensation, and cranial nerves is limited at this time. Patient noted to spontaneously move all 4 extremities. No tremors.  ***    Labs on Admission: I have personally reviewed following labs and imaging studies  CBC: Recent Labs  Lab 11/26/21 1658  WBC 7.2  HGB 12.6  HCT 38.1  MCV 92.5  PLT 381   Basic Metabolic Panel: Recent Labs  Lab 11/26/21 1658  NA 135  K 4.6  CL 99  CO2 30  GLUCOSE 117*  BUN 16  CREATININE 0.73  CALCIUM 8.9   GFR: Estimated Creatinine Clearance: 50.1 mL/min (by C-G formula based on SCr of 0.73 mg/dL). Liver Function Tests: Recent Labs  Lab 11/26/21 1658  AST 15  ALT 11  ALKPHOS 68   BILITOT 0.7  PROT 6.7  ALBUMIN 3.9   No results for input(s): "LIPASE", "AMYLASE" in the last 168 hours. No results for input(s): "AMMONIA" in the last 168 hours. Coagulation Profile: No results for input(s): "INR", "PROTIME" in the last 168 hours. Cardiac Enzymes: No results for input(s): "CKTOTAL", "CKMB", "CKMBINDEX", "TROPONINI" in the last 168 hours. BNP (last 3 results) No results for input(s): "PROBNP" in the last 8760 hours. HbA1C: No results for input(s): "HGBA1C" in the last 72 hours. CBG: No results for input(s): "GLUCAP" in the last 168 hours. Lipid Profile: No results for input(s): "CHOL", "HDL", "LDLCALC", "TRIG", "CHOLHDL", "LDLDIRECT" in the last 72 hours. Thyroid Function Tests: No results for input(s): "TSH", "T4TOTAL", "FREET4", "T3FREE", "THYROIDAB" in the last 72 hours. Anemia Panel: No results for input(s): "VITAMINB12", "FOLATE", "FERRITIN", "TIBC", "IRON", "RETICCTPCT" in the last 72 hours. Urine analysis: No results found for: "COLORURINE", "APPEARANCEUR", "LABSPEC", "PHURINE", "GLUCOSEU", "HGBUR", "BILIRUBINUR", "KETONESUR", "PROTEINUR", "UROBILINOGEN", "NITRITE", "LEUKOCYTESUR"  Radiological Exams on Admission: DG Shoulder Right  Result Date: 11/26/2021 CLINICAL DATA:  [id number]. Fall injury at home, initial encounter with right shoulder, right humerus, left knee and right ankle pain. EXAM: RIGHT SHOULDER - 2+ VIEW; LEFT KNEE - 3 VIEW; RIGHT HUMERUS - 2+ VIEW; RIGHT ANKLE - 2 VIEW COMPARISON:  No comparison studies for the above body parts. FINDINGS: Right shoulder, AP external rotation view and transscapular Y-view only: There is osteopenia without evidence of fractures or dislocation. There are mild circumferential osteophytes at the [facility] joint, trace spurring of the inferior glenohumeral joint, and mild spurring and subcortical cystic change along the humeral cuff insertion. The right upper lung field is clear. The visualized right ribs show no displaced  fractures. No pneumothorax. Right humerus, AP Lat views: There is osteopenia without evidence of fractures or dislocation. No other focal bone abnormality is seen. Soft tissues are unremarkable. Left knee, routine four views: There is moderate-sized suprapatellar bursal effusion. There is osteopenia without evidence of fractures. There is moderate tricompartmental joint space loss and marginal osteophytosis. No osteochondral loose body is seen. Superficial soft tissues are unremarkable. Right ankle, AP Lat only: There is edema in the distal foreleg and moderate to severe soft tissue swelling at the ankle continuing  over the hindfoot and midfoot. Osteopenia. There is bony debris inferior to the medial malleolus which could represent chip fractures versus dystrophic ligamentous or tendon calcifications. There is no further evidence of fractures. The joint space is maintained. There is mild-to-moderate midfoot arthrosis. Interosseous alignment is normal. IMPRESSION: 1. Osteopenia without evidence of fractures of the right shoulder, right humerus and left knee. 2. Moderate-sized left knee suprapatellar bursal effusion. 3. Bony debris inferior to the medial malleolus which could represent chip fractures versus dystrophic ligamentous or tendon calcifications. 4. Moderate to severe soft tissue swelling at the ankle continuing into the foot. 5. Mild degenerative changes of the right shoulder and right humerus. 6. 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 Electronically Signed   By: Telford Nab M.D.   On: 11/26/2021 20:09   DG Humerus Right  Result Date: 11/26/2021 CLINICAL DATA:  [id number]. Fall injury at home, initial encounter with right shoulder, right humerus, left knee and right ankle pain. EXAM: RIGHT SHOULDER - 2+ VIEW; LEFT KNEE - 3 VIEW; RIGHT HUMERUS - 2+ VIEW; RIGHT ANKLE - 2 VIEW COMPARISON:  No comparison studies for the above body parts. FINDINGS: Right shoulder, AP external rotation view and transscapular  Y-view only: There is osteopenia without evidence of fractures or dislocation. There are mild circumferential osteophytes at the [facility] joint, trace spurring of the inferior glenohumeral joint, and mild spurring and subcortical cystic change along the humeral cuff insertion. The right upper lung field is clear. The visualized right ribs show no displaced fractures. No pneumothorax. Right humerus, AP Lat views: There is osteopenia without evidence of fractures or dislocation. No other focal bone abnormality is seen. Soft tissues are unremarkable. Left knee, routine four views: There is moderate-sized suprapatellar bursal effusion. There is osteopenia without evidence of fractures. There is moderate tricompartmental joint space loss and marginal osteophytosis. No osteochondral loose body is seen. Superficial soft tissues are unremarkable. Right ankle, AP Lat only: There is edema in the distal foreleg and moderate to severe soft tissue swelling at the ankle continuing over the hindfoot and midfoot. Osteopenia. There is bony debris inferior to the medial malleolus which could represent chip fractures versus dystrophic ligamentous or tendon calcifications. There is no further evidence of fractures. The joint space is maintained. There is mild-to-moderate midfoot arthrosis. Interosseous alignment is normal. IMPRESSION: 1. Osteopenia without evidence of fractures of the right shoulder, right humerus and left knee. 2. Moderate-sized left knee suprapatellar bursal effusion. 3. Bony debris inferior to the medial malleolus which could represent chip fractures versus dystrophic ligamentous or tendon calcifications. 4. Moderate to severe soft tissue swelling at the ankle continuing into the foot. 5. Mild degenerative changes of the right shoulder and right humerus. 6. 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 Electronically Signed   By: Telford Nab M.D.   On: 11/26/2021 20:09   DG Knee 3 Views Left  Result Date:  11/26/2021 CLINICAL DATA:  [id number]. Fall injury at home, initial encounter with right shoulder, right humerus, left knee and right ankle pain. EXAM: RIGHT SHOULDER - 2+ VIEW; LEFT KNEE - 3 VIEW; RIGHT HUMERUS - 2+ VIEW; RIGHT ANKLE - 2 VIEW COMPARISON:  No comparison studies for the above body parts. FINDINGS: Right shoulder, AP external rotation view and transscapular Y-view only: There is osteopenia without evidence of fractures or dislocation. There are mild circumferential osteophytes at the [facility] joint, trace spurring of the inferior glenohumeral joint, and mild spurring and subcortical cystic change along the humeral cuff insertion. The right upper lung field is  clear. The visualized right ribs show no displaced fractures. No pneumothorax. Right humerus, AP Lat views: There is osteopenia without evidence of fractures or dislocation. No other focal bone abnormality is seen. Soft tissues are unremarkable. Left knee, routine four views: There is moderate-sized suprapatellar bursal effusion. There is osteopenia without evidence of fractures. There is moderate tricompartmental joint space loss and marginal osteophytosis. No osteochondral loose body is seen. Superficial soft tissues are unremarkable. Right ankle, AP Lat only: There is edema in the distal foreleg and moderate to severe soft tissue swelling at the ankle continuing over the hindfoot and midfoot. Osteopenia. There is bony debris inferior to the medial malleolus which could represent chip fractures versus dystrophic ligamentous or tendon calcifications. There is no further evidence of fractures. The joint space is maintained. There is mild-to-moderate midfoot arthrosis. Interosseous alignment is normal. IMPRESSION: 1. Osteopenia without evidence of fractures of the right shoulder, right humerus and left knee. 2. Moderate-sized left knee suprapatellar bursal effusion. 3. Bony debris inferior to the medial malleolus which could represent chip fractures versus  dystrophic ligamentous or tendon calcifications. 4. Moderate to severe soft tissue swelling at the ankle continuing into the foot. 5. Mild degenerative changes of the right shoulder and right humerus. 6. 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 Electronically Signed   By: Telford Nab M.D.   On: 11/26/2021 20:09   DG Ankle 2 Views Right  Result Date: 11/26/2021 CLINICAL DATA:  [id number]. Fall injury at home, initial encounter with right shoulder, right humerus, left knee and right ankle pain. EXAM: RIGHT SHOULDER - 2+ VIEW; LEFT KNEE - 3 VIEW; RIGHT HUMERUS - 2+ VIEW; RIGHT ANKLE - 2 VIEW COMPARISON:  No comparison studies for the above body parts. FINDINGS: Right shoulder, AP external rotation view and transscapular Y-view only: There is osteopenia without evidence of fractures or dislocation. There are mild circumferential osteophytes at the [facility] joint, trace spurring of the inferior glenohumeral joint, and mild spurring and subcortical cystic change along the humeral cuff insertion. The right upper lung field is clear. The visualized right ribs show no displaced fractures. No pneumothorax. Right humerus, AP Lat views: There is osteopenia without evidence of fractures or dislocation. No other focal bone abnormality is seen. Soft tissues are unremarkable. Left knee, routine four views: There is moderate-sized suprapatellar bursal effusion. There is osteopenia without evidence of fractures. There is moderate tricompartmental joint space loss and marginal osteophytosis. No osteochondral loose body is seen. Superficial soft tissues are unremarkable. Right ankle, AP Lat only: There is edema in the distal foreleg and moderate to severe soft tissue swelling at the ankle continuing over the hindfoot and midfoot. Osteopenia. There is bony debris inferior to the medial malleolus which could represent chip fractures versus dystrophic ligamentous or tendon calcifications. There is no further evidence of fractures. The joint  space is maintained. There is mild-to-moderate midfoot arthrosis. Interosseous alignment is normal. IMPRESSION: 1. Osteopenia without evidence of fractures of the right shoulder, right humerus and left knee. 2. Moderate-sized left knee suprapatellar bursal effusion. 3. Bony debris inferior to the medial malleolus which could represent chip fractures versus dystrophic ligamentous or tendon calcifications. 4. Moderate to severe soft tissue swelling at the ankle continuing into the foot. 5. Mild degenerative changes of the right shoulder and right humerus. 6. 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 Electronically Signed   By: Telford Nab M.D.   On: 11/26/2021 20:09   CT Chest Wo Contrast  Result Date: 11/26/2021 CLINICAL DATA:  Chest trauma.  [place]  last evening. EXAM: CT CHEST WITHOUT CONTRAST TECHNIQUE: Multidetector CT imaging of the chest was performed following the standard protocol without IV contrast. RADIATION DOSE REDUCTION: This exam was performed according to the departmental dose-optimization program which includes automated exposure control, adjustment of the mA and/or kV according to patient size and/or use of iterative reconstruction technique. COMPARISON:  None Available. FINDINGS: Cardiovascular: The heart is normal in size. No pericardial effusion. The aorta is normal in caliber. Minimal scattered atherosclerotic calcifications for age. Scattered three-vessel coronary artery calcifications are noted. Mediastinum/Nodes: Small scattered mediastinal and hilar lymph nodes but no mass or overt adenopathy. The esophagus is grossly normal. Lungs/Pleura: Underlying emphysematous changes and pulmonary scarring. Mosaic pattern of ground-glass attenuation typically seen with small airways disease such as asthma or bronchiolitis. No focal pulmonary infiltrates or pleural effusions. Bibasilar scarring changes. Scattered sub 5 mm pulmonary nodules are likely benign. Subpleural atelectasis or scarring noted  adjacent to large right-sided thoracic spine spurs. The central tracheobronchial tree is unremarkable. Upper Abdomen: No significant upper abdominal findings. Musculoskeletal: Severe compression deformity of T8 is a stable finding when compared to prior chest x-ray from February 2023. T12 fracture appears acute. No sternal or rib fractures. IMPRESSION: 1. T12 fracture appears acute. 2. Stable remote severe compression deformity of T8. 3. Underlying emphysematous changes and pulmonary scarring. 4. Mosaic pattern of ground-glass attenuation typically seen with small airways disease such as asthma or bronchiolitis. 5. Small scattered sub 5 mm pulmonary nodules, likely benign. Follow-up noncontrast chest CT in 6-12 months is suggested. Emphysema (ICD10-J43.9). Electronically Signed   By: Marijo Sanes M.D.   On: 11/26/2021 14:58   CT Lumbar Spine Wo Contrast  Result Date: 11/26/2021 CLINICAL DATA:  Back pain.  Fell last evening. EXAM: CT LUMBAR SPINE WITHOUT CONTRAST TECHNIQUE: Multidetector CT imaging of the lumbar spine was performed without intravenous contrast administration. Multiplanar CT image reconstructions were also generated. RADIATION DOSE REDUCTION: This exam was performed according to the departmental dose-optimization program which includes automated exposure control, adjustment of the mA and/or kV according to patient size and/or use of iterative reconstruction technique. COMPARISON:  Lumbar spine radiographs 08/30/2020 FINDINGS: Segmentation: There are five lumbar type vertebral bodies. The last full intervertebral disc space is labeled L5-S1. Alignment: Normal alignment of the lumbar vertebral bodies. The facets are also normally aligned. Vertebrae: Diffuse and fairly marked osteoporosis. Mild superior endplate depression of L3 is stable since the prior radiographs. No acute lumbar spine compression fracture. There is a compression deformity of T12. I do not see this for certain on the prior  lumbar spine plain films. This is likely an acute compression fracture. No retropulsion or canal compromise. Paraspinal and other soft tissues: No significant paraspinal findings. I do not see an obvious paraspinal hematoma at T12. Disc levels: T12-L1: No significant findings. L1-2: Diffuse annular bulge and mild facet disease contributing to mild bilateral lateral recess stenosis. L2-3: Moderate facet disease and bulging annulus with mild spinal and bilateral lateral recess stenosis. L3-4: Diffuse bulging annulus, facet disease and ligamentum flavum thickening contributing to mild to moderate spinal and bilateral lateral recess stenosis. L4-5: Diffuse bulging degenerated annulus, facet disease and ligamentum flavum thickening contributing to moderately severe spinal and bilateral lateral recess stenosis. No significant foraminal stenosis. L5-S1: Bulging degenerated annulus and severe facet disease with mild spinal and bilateral lateral recess stenosis. No foraminal stenosis. IMPRESSION: 1. Acute compression fracture of T12 without retropulsion or canal compromise. 2. Diffuse and fairly marked osteoporosis. 3. Multilevel spinal and lateral recess  stenosis as discussed above. Electronically Signed   By: Marijo Sanes M.D.   On: 11/26/2021 14:49   CT HEAD WO CONTRAST (5MM)  Result Date: 11/26/2021 CLINICAL DATA:  Head trauma, minor (Age >= 65y); Polytrauma, blunt EXAM: CT HEAD WITHOUT CONTRAST CT CERVICAL SPINE WITHOUT CONTRAST TECHNIQUE: Multidetector CT imaging of the head and cervical spine was performed following the standard protocol without intravenous contrast. Multiplanar CT image reconstructions of the cervical spine were also generated. RADIATION DOSE REDUCTION: This exam was performed according to the departmental dose-optimization program which includes automated exposure control, adjustment of the mA and/or kV according to patient size and/or use of iterative reconstruction technique. COMPARISON:   None Available. FINDINGS: CT HEAD FINDINGS Brain: No evidence of acute infarction, hemorrhage, hydrocephalus, extra-axial collection or mass lesion/mass effect. Vascular: Calcific atherosclerosis. Skull: No acute fracture. Sinuses/Orbits: Clear sinuses.  No acute orbital findings. Other: No mastoid effusions. CT CERVICAL SPINE FINDINGS Alignment: No substantial sagittal subluxation. Skull base and vertebrae: No evidence of acute fracture. Soft tissues and spinal canal: No prevertebral fluid or swelling. No visible canal hematoma. Disc levels:  Moderate multilevel degenerative change. Upper chest: Visualized lung apices are clear. IMPRESSION: 1. No evidence of acute intracranial abnormality. 2. No evidence of acute fracture or traumatic malalignment in the cervical spine. Electronically Signed   By: Margaretha Sheffield M.D.   On: 11/26/2021 14:43   CT CERVICAL SPINE WO CONTRAST  Result Date: 11/26/2021 CLINICAL DATA:  Head trauma, minor (Age >= 65y); Polytrauma, blunt EXAM: CT HEAD WITHOUT CONTRAST CT CERVICAL SPINE WITHOUT CONTRAST TECHNIQUE: Multidetector CT imaging of the head and cervical spine was performed following the standard protocol without intravenous contrast. Multiplanar CT image reconstructions of the cervical spine were also generated. RADIATION DOSE REDUCTION: This exam was performed according to the departmental dose-optimization program which includes automated exposure control, adjustment of the mA and/or kV according to patient size and/or use of iterative reconstruction technique. COMPARISON:  None Available. FINDINGS: CT HEAD FINDINGS Brain: No evidence of acute infarction, hemorrhage, hydrocephalus, extra-axial collection or mass lesion/mass effect. Vascular: Calcific atherosclerosis. Skull: No acute fracture. Sinuses/Orbits: Clear sinuses.  No acute orbital findings. Other: No mastoid effusions. CT CERVICAL SPINE FINDINGS Alignment: No substantial sagittal subluxation. Skull base and  vertebrae: No evidence of acute fracture. Soft tissues and spinal canal: No prevertebral fluid or swelling. No visible canal hematoma. Disc levels:  Moderate multilevel degenerative change. Upper chest: Visualized lung apices are clear. IMPRESSION: 1. No evidence of acute intracranial abnormality. 2. No evidence of acute fracture or traumatic malalignment in the cervical spine. Electronically Signed   By: Margaretha Sheffield M.D.   On: 11/26/2021 14:43   DG Knee Complete 4 Views Right  Result Date: 11/26/2021 CLINICAL DATA:  Right knee pain after fall. EXAM: RIGHT KNEE - COMPLETE 4+ VIEW COMPARISON:  None Available. FINDINGS: Minimally displaced proximal right fibular fracture is noted. Moderate degenerative changes are seen involving the medial, lateral and patellofemoral spaces of the right knee. IMPRESSION: Minimally displaced proximal right fibular fracture. Electronically Signed   By: Marijo Conception M.D.   On: 11/26/2021 13:57   DG Pelvis Portable  Result Date: 11/26/2021 CLINICAL DATA:  Fall. EXAM: PORTABLE PELVIS 1-2 VIEWS COMPARISON:  None Available. FINDINGS: There is no evidence of pelvic fracture or diastasis. No pelvic bone lesions are seen. IMPRESSION: Negative. Electronically Signed   By: Marijo Conception M.D.   On: 11/26/2021 13:55      Assessment/Plan    Principal Problem:  Fall at home, initial encounter Active Problems:   HTN (hypertension)   Hypothyroidism (acquired)   Anxiety and depression   Atrial fibrillation (HCC)  ***      ***          ***           ***          ***          ***          ***          ***          ***          ***          ***     ***  DVT prophylaxis: SCD's ***  Code Status: Full code*** Family Communication: none*** Disposition Plan: Per Rounding Team Consults called: none***;  Admission status: ***     Ethelda Chick Apolonio Cutting DO [facility] [place]   11/26/2021, 10:35 PM   ***

## 2021-11-26 NOTE — Consult Note (Signed)
Initial Consultation Note - Televisit   Patient: Carmen Haney VZD:[id number] DOB: 1930/11/26 PCP: Kelton Pillar, MD DOA: 11/26/2021 DOS: the patient was seen and examined on 11/26/2021 Primary service: [name], MD  Referring physician: Oswald Hillock - ED Reason for consult: Fall   Assessment and Plan: * Fall at home, initial encounter -Patient with a fall at home and diffuse pain -Imaging shows a R proximal fibular fracture; knee immobilizer in place -She has h/o compression fractures and has a new acute one presumably from the fall; her back is not currently painful to her -She also complains of recent L knee pain as well as diffuse R shoulder/arm pain; will order more xrays -Patient is unable to ambulate and so cannot go home -Will admit to med surg -Orthopedics has been consulted; patient is requesting a transition to Dr. Mardelle Matte and his team will see the patient tomorrow -Current issues appear to be non-operative -Will need PT/OT consults -She may need placement depending on progress -TOC team also consulted  Atrial fibrillation ([place]) -Rate controlled with metoprolol -Continue Eliquis  Anxiety and depression -Continue sertraline, Ativan  Hypothyroidism (acquired) -Continue Synthroid at current dose for now   HTN (hypertension) -Continue metoprolol     HPI: Carmen Haney is a 88 y.o. female with past medical history of remote breast cancer; CAD; HTN; HLD; and hypothyroidism presenting with a fall.  She tried to get up overnight and stood from the bed and her legs gave out.  They have been doing that for 3-4 weeks.  She was holding on to the walker and couldn't hold herself up.  She hurt her legs and her R arm, slight "crick" in her neck, hurts all over.  For the last 2-3 weeks it has been progressing, trouble walking.  Her knees and legs would give way.   No recent back issues, denies back pain.  The L knee has been hurting the last 2 weeks but the R knee hurts  more now.  She is hurting in her R shoulder and all the way down.  They have been trying to get in to see Dr. Mardelle Matte (previously saw Dr. Ronnie Derby).  Lives with her daughter right near [place].  She has advanced osteoporosis, takes only calcium + vitamin D - she has never been on bisphosphonate therapy.   Review of Systems: As mentioned in the history of present illness. All other systems reviewed and are negative.  Past Medical History:  Diagnosis Date   ACE-inhibitor cough    Breast CA ([place]) 2010   s/p lumpectomy & radiation   CAD (coronary artery disease) 07/1998   s/p cath in 2000. L main with 30 to 40%   Hearing loss    HTN (hypertension)    Hyperlipidemia    Obesities, morbid ([place])    Osteoporosis    Personal history of radiation therapy 2010   Right Breast Cancer   Thyroid disease    Vertebral fracture, pathological    Past Surgical History:  Procedure Laterality Date   BREAST BIOPSY Left    BREAST LUMPECTOMY Right March 2010   BREAST LUMPECTOMY WITH RADIOACTIVE SEED LOCALIZATION Left 09/20/2014   Procedure: LEFT BREAST LUMPECTOMY WITH RADIOACTIVE SEED LOCALIZATION;  Surgeon: Autumn Messing III, MD;  Location: [facility];  Service: General;  Laterality: Left;   CARPAL TUNNEL RELEASE Right    CESAREAN SECTION     EYE SURGERY     Right Eye   Skin Cancer Removed from Right Forearm  TOTAL ABDOMINAL HYSTERECTOMY     Social History:  reports that she quit smoking about 58 years ago. Her smoking use included cigarettes. She has never used smokeless tobacco. She reports that she does not drink alcohol and does not use drugs.  Allergies  Allergen Reactions   Ace Inhibitors Cough   Latex Rash    Family History  Problem Relation Age of Onset   Coronary artery disease Mother    Hypertension Mother    Melanoma Mother    Cancer Mother 67       breast   Breast cancer Mother    Cancer Father 51       prostate   Cancer Sister 76       breast   Breast cancer  Sister    Cancer Maternal Aunt 51       breast   Cancer Sister 2       breast   Breast cancer Sister    Breast cancer Daughter 76    Prior to Admission medications   Medication Sig Start Date End Date Taking? Authorizing Provider  acetaminophen (TYLENOL) 500 MG tablet Take 1,000 mg by mouth daily.    [provider]  apixaban (ELIQUIS) 5 MG TABS tablet Take 1 tablet (5 mg total) by mouth 2 (two) times daily. 02/16/21   [facility], Tami Lin, PA  Calcium Carbonate-Vitamin D (CALCIUM + D PO) Take 2 tablets by mouth daily. gummy    [provider]  furosemide (LASIX) 20 MG tablet Take 1 tablet (20 mg total) by mouth daily. Patient taking differently: Take 40 mg by mouth daily. Take 40 mg Twice Weekly 05/02/21   Lelon Perla, MD  levothyroxine (SYNTHROID) 125 MCG tablet Take 125 mcg by mouth daily before breakfast.    [provider]  levothyroxine (SYNTHROID) 150 MCG tablet Take by mouth. 04/07/21   [provider]  LORazepam (ATIVAN) 0.5 MG tablet Take 0.5 mg by mouth every 8 (eight) hours as needed for anxiety.    [provider]  metoprolol tartrate (LOPRESSOR) 25 MG tablet Take 0.5 tablets (12.5 mg total) by mouth 2 (two) times daily. 02/16/21   [facility], Tami Lin, PA  Polyethyl Glycol-Propyl Glycol (SYSTANE OP) Place 2-3 drops into both eyes at bedtime.    [provider]  polyethylene glycol powder (GLYCOLAX/MIRALAX) 17 GM/SCOOP powder Take 17 g by mouth daily as needed for mild constipation.    [provider]  Potassium 95 MG TABS Take 95 mg by mouth 2 (two) times a week. Take Twice Weekly on Days When Taking lasix    [provider]  sertraline (ZOLOFT) 100 MG tablet Take 200 mg by mouth at bedtime. 10/29/15   [provider]  olmesartan (BENICAR) 20 MG tablet Take 20 mg by mouth daily.    04/25/10  [provider]    Physical Exam: Vitals:   11/26/21 1615 11/26/21 1645 11/26/21 1700 11/26/21  1850  BP: (!) 122/57 131/65 132/62 119/89  Pulse: 66 75 70 65  Resp: 19 16 (!) 22 19  Temp:  99.3 F (37.4 C)  100 F (37.8 C)  TempSrc:    Oral  SpO2: 92% 94% 95% 97%  Weight:      Height:       General:  Appears calm and comfortable and is in NAD Eyes:  PERRL, EOMI, normal lids, iris ENT:  grossly normal hearing, lips & tongue, mmm; reports posterior dental issues Neck:  no LAD, masses or  thyromegaly per RN Cardiovascular:  RRR, no m/r/g. No LE edema.  Respiratory:   CTA bilaterally with no wheezes/rales/rhonchi.  Normal respiratory effort. Abdomen:  soft, NT, ND Skin:  no rash or induration seen on limited exam Musculoskeletal:  R knee immobilizer in place, B stasis changes Psychiatric:  grossly normal mood and affect, speech fluent and appropriate, AOx3 Neurologic:  CN 2-12 grossly intact, moves all extremities in coordinated fashion   Radiological Exams on Admission: Independently reviewed - see discussion in A/P where applicable  CT Chest Wo Contrast  Result Date: 11/26/2021 CLINICAL DATA:  Chest trauma.  Fell last evening. EXAM: CT CHEST WITHOUT CONTRAST TECHNIQUE: Multidetector CT imaging of the chest was performed following the standard protocol without IV contrast. RADIATION DOSE REDUCTION: This exam was performed according to the departmental dose-optimization program which includes automated exposure control, adjustment of the mA and/or kV according to patient size and/or use of iterative reconstruction technique. COMPARISON:  None Available. FINDINGS: Cardiovascular: The heart is normal in size. No pericardial effusion. The aorta is normal in caliber. Minimal scattered atherosclerotic calcifications for age. Scattered three-vessel coronary artery calcifications are noted. Mediastinum/Nodes: Small scattered mediastinal and hilar lymph nodes but no mass or overt adenopathy. The esophagus is grossly normal. Lungs/Pleura: Underlying emphysematous changes and pulmonary  scarring. Mosaic pattern of ground-glass attenuation typically seen with small airways disease such as asthma or bronchiolitis. No focal pulmonary infiltrates or pleural effusions. Bibasilar scarring changes. Scattered sub 5 mm pulmonary nodules are likely benign. Subpleural atelectasis or scarring noted adjacent to large right-sided thoracic spine spurs. The central tracheobronchial tree is unremarkable. Upper Abdomen: No significant upper abdominal findings. Musculoskeletal: Severe compression deformity of T8 is a stable finding when compared to prior chest x-ray from February 2023. T12 fracture appears acute. No sternal or rib fractures. IMPRESSION: 1. T12 fracture appears acute. 2. Stable remote severe compression deformity of T8. 3. Underlying emphysematous changes and pulmonary scarring. 4. Mosaic pattern of ground-glass attenuation typically seen with small airways disease such as asthma or bronchiolitis. 5. Small scattered sub 5 mm pulmonary nodules, likely benign. Follow-up noncontrast chest CT in 6-12 months is suggested. Emphysema (ICD10-J43.9). Electronically Signed   By: Marijo Sanes M.D.   On: 11/26/2021 14:58   CT Lumbar Spine Wo Contrast  Result Date: 11/26/2021 CLINICAL DATA:  Back pain.  Fell last evening. EXAM: CT LUMBAR SPINE WITHOUT CONTRAST TECHNIQUE: Multidetector CT imaging of the lumbar spine was performed without intravenous contrast administration. Multiplanar CT image reconstructions were also generated. RADIATION DOSE REDUCTION: This exam was performed according to the departmental dose-optimization program which includes automated exposure control, adjustment of the mA and/or kV according to patient size and/or use of iterative reconstruction technique. COMPARISON:  Lumbar spine radiographs 08/30/2020 FINDINGS: Segmentation: There are five lumbar type vertebral bodies. The last full intervertebral disc space is labeled L5-S1. Alignment: Normal alignment of the lumbar vertebral  bodies. The facets are also normally aligned. Vertebrae: Diffuse and fairly marked osteoporosis. Mild superior endplate depression of L3 is stable since the prior radiographs. No acute lumbar spine compression fracture. There is a compression deformity of T12. I do not see this for certain on the prior lumbar spine plain films. This is likely an acute compression fracture. No retropulsion or canal compromise. Paraspinal and other soft tissues: No significant paraspinal findings. I do not see an obvious paraspinal hematoma at T12. Disc levels: T12-L1: No significant findings. L1-2: Diffuse annular bulge and mild facet disease contributing to mild bilateral lateral recess stenosis.  L2-3: Moderate facet disease and bulging annulus with mild spinal and bilateral lateral recess stenosis. L3-4: Diffuse bulging annulus, facet disease and ligamentum flavum thickening contributing to mild to moderate spinal and bilateral lateral recess stenosis. L4-5: Diffuse bulging degenerated annulus, facet disease and ligamentum flavum thickening contributing to moderately severe spinal and bilateral lateral recess stenosis. No significant foraminal stenosis. L5-S1: Bulging degenerated annulus and severe facet disease with mild spinal and bilateral lateral recess stenosis. No foraminal stenosis. IMPRESSION: 1. Acute compression fracture of T12 without retropulsion or canal compromise. 2. Diffuse and fairly marked osteoporosis. 3. Multilevel spinal and lateral recess stenosis as discussed above. Electronically Signed   By: Marijo Sanes M.D.   On: 11/26/2021 14:49   CT HEAD WO CONTRAST (5MM)  Result Date: 11/26/2021 CLINICAL DATA:  Head trauma, minor (Age >= 65y); Polytrauma, blunt EXAM: CT HEAD WITHOUT CONTRAST CT CERVICAL SPINE WITHOUT CONTRAST TECHNIQUE: Multidetector CT imaging of the head and cervical spine was performed following the standard protocol without intravenous contrast. Multiplanar CT image reconstructions of the  cervical spine were also generated. RADIATION DOSE REDUCTION: This exam was performed according to the departmental dose-optimization program which includes automated exposure control, adjustment of the mA and/or kV according to patient size and/or use of iterative reconstruction technique. COMPARISON:  None Available. FINDINGS: CT HEAD FINDINGS Brain: No evidence of acute infarction, hemorrhage, hydrocephalus, extra-axial collection or mass lesion/mass effect. Vascular: Calcific atherosclerosis. Skull: No acute fracture. Sinuses/Orbits: Clear sinuses.  No acute orbital findings. Other: No mastoid effusions. CT CERVICAL SPINE FINDINGS Alignment: No substantial sagittal subluxation. Skull base and vertebrae: No evidence of acute fracture. Soft tissues and spinal canal: No prevertebral fluid or swelling. No visible canal hematoma. Disc levels:  Moderate multilevel degenerative change. Upper chest: Visualized lung apices are clear. IMPRESSION: 1. No evidence of acute intracranial abnormality. 2. No evidence of acute fracture or traumatic malalignment in the cervical spine. Electronically Signed   By: Margaretha Sheffield M.D.   On: 11/26/2021 14:43   CT CERVICAL SPINE WO CONTRAST  Result Date: 11/26/2021 CLINICAL DATA:  Head trauma, minor (Age >= 65y); Polytrauma, blunt EXAM: CT HEAD WITHOUT CONTRAST CT CERVICAL SPINE WITHOUT CONTRAST TECHNIQUE: Multidetector CT imaging of the head and cervical spine was performed following the standard protocol without intravenous contrast. Multiplanar CT image reconstructions of the cervical spine were also generated. RADIATION DOSE REDUCTION: This exam was performed according to the departmental dose-optimization program which includes automated exposure control, adjustment of the mA and/or kV according to patient size and/or use of iterative reconstruction technique. COMPARISON:  None Available. FINDINGS: CT HEAD FINDINGS Brain: No evidence of acute infarction, hemorrhage,  hydrocephalus, extra-axial collection or mass lesion/mass effect. Vascular: Calcific atherosclerosis. Skull: No acute fracture. Sinuses/Orbits: Clear sinuses.  No acute orbital findings. Other: No mastoid effusions. CT CERVICAL SPINE FINDINGS Alignment: No substantial sagittal subluxation. Skull base and vertebrae: No evidence of acute fracture. Soft tissues and spinal canal: No prevertebral fluid or swelling. No visible canal hematoma. Disc levels:  Moderate multilevel degenerative change. Upper chest: Visualized lung apices are clear. IMPRESSION: 1. No evidence of acute intracranial abnormality. 2. No evidence of acute fracture or traumatic malalignment in the cervical spine. Electronically Signed   By: Margaretha Sheffield M.D.   On: 11/26/2021 14:43   DG Knee Complete 4 Views Right  Result Date: 11/26/2021 CLINICAL DATA:  Right knee pain after fall. EXAM: RIGHT KNEE - COMPLETE 4+ VIEW COMPARISON:  None Available. FINDINGS: Minimally displaced proximal right fibular fracture is noted.  Moderate degenerative changes are seen involving the medial, lateral and patellofemoral spaces of the right knee. IMPRESSION: Minimally displaced proximal right fibular fracture. Electronically Signed   By: Marijo Conception M.D.   On: 11/26/2021 13:57   DG Pelvis Portable  Result Date: 11/26/2021 CLINICAL DATA:  Fall. EXAM: PORTABLE PELVIS 1-2 VIEWS COMPARISON:  None Available. FINDINGS: There is no evidence of pelvic fracture or diastasis. No pelvic bone lesions are seen. IMPRESSION: Negative. Electronically Signed   By: Marijo Conception M.D.   On: 11/26/2021 13:55    EKG: not done   Labs on Admission: I have personally reviewed the available labs and imaging studies at the time of the admission.  Pertinent labs:    CBC and CMP ordered, pending    Family Communication: Daughter was present throughout evaluation  Primary team communication: EDP was not available at the time of call back; report provided by  RN  Thank you very much for involving [place] in the care of your patient.  Author: [name], MD 11/26/2021 7:12 PM  For on call review [email or web address].

## 2021-11-26 NOTE — ED Provider Notes (Signed)
[facility] DEPT Provider Note   CSN: [id number] Arrival date & time: 11/26/21  1211     History Chief Complaint  Patient presents with   Fall    HPI Carmen Haney is a 88 y.o. female presenting for chief complaint of ground-level fall.  Patient is a 88 year old female with a history of A-fib currently on Eliquis.  The patient was trying to get out of bed to go to the bathroom when she lost her balance and fell over to the right.  She denies fevers or chills, nausea vomiting, syncope or shortness of breath.  She is otherwise ambulatory tolerating p.o. intake.  No medications prior to arrival.  Substantial pain diffusely mostly localized over her right musculoskeletal system.  Patient's recorded medical, surgical, social, medication list and allergies were reviewed in the Snapshot window as part of the initial history.   Review of Systems   Review of Systems  Constitutional:  Negative for chills and fever.  HENT:  Negative for ear pain and sore throat.   Eyes:  Negative for pain and visual disturbance.  Respiratory:  Negative for cough and shortness of breath.   Cardiovascular:  Negative for chest pain and palpitations.  Gastrointestinal:  Negative for abdominal pain and vomiting.  Genitourinary:  Negative for dysuria and hematuria.  Musculoskeletal:  Positive for myalgias. Negative for arthralgias and back pain.  Skin:  Negative for color change and rash.  Neurological:  Negative for seizures and syncope.  All other systems reviewed and are negative.   Physical Exam Updated Vital Signs BP (!) 140/61 (BP Location: Left Arm)   Pulse 70   Temp 99.7 F (37.6 C) (Oral)   Resp 19   Ht '5\' 3"'$  (1.6 m)   Wt 88 kg   SpO2 94%   BMI 34.37 kg/m  Physical Exam Physical Exam  Neurologic: GCS 15, motor intact in all four extremities, sensory intact in all 4 extremities  Head: Pupils are 50m, equally round and reactive to light, patient has no obvious facial  trauma, no hemotympanum  Neck: patient has no midline neck tenderness, no obvious injuries.  Thorax: Patient has stable clavicles, stable thorax with bilateral chest rise and breath sounds heard.  No penetrating thoracic injury.  CV/Pulm: RRR, no audible murmer/rubs/gallops, CTAB  Abdomen: Patient has no abdominal distention, no penetrating abdominal injury.  Back: Patient has midline spinal tenderness in the thoracic and lumbar spine, patient has no paraspinal tenderness bilaterally.  Pelvis: Patient has a stable pelvis to compression with palpable femoral pulses.  Extremities:Patient's upper extremities with no obvious injury or abnormality, radial pulses present. Patient's lower extremities with no obvious injury or abnormality, tibial pulses present.    ED Course/ Medical Decision Making/ A&P Clinical Course as of 11/26/21 1538  Wed Nov 26, 2021  1510 CT Chest Wo Contrast [CC]    Clinical Course User Index [CC] CTretha Sciara MD    Procedures Procedures   Medications Ordered in ED Medications  acetaminophen (TYLENOL) tablet 1,000 mg (1,000 mg Oral Given 11/26/21 1329)  oxyCODONE (Oxy IR/ROXICODONE) immediate release tablet 5 mg (5 mg Oral Given 11/26/21 1329)   Medical Decision Making:   Carmen Haney a 88y.o. female who presented to the ED today with a fall at their home detailed above. They are on a blood thinner. Patient's presentation is complicated by their history of multiple comorbid medical problems.  Patient placed on continuous vitals and telemetry monitoring while in ED which was reviewed  periodically.   Complete initial physical exam performed, notably the patient  was hemodynamically stable  in no acute distress. No obvious deformities or injuries appreciated on extensive physical exam including active range of motion of all joints.     Reviewed and confirmed nursing documentation for past medical history, family history, social history.    Initial  Assessment/Plan:   This is a patient presenting with a moderate blunt mechanism trauma.  As such, I have considered intracranial injuries including intracranial hemorrhage, intrathoracic injuries including blunt myocardial or blunt lung injury, blunt abdominal injuries including aortic dissection, bladder injury, spleen injury, liver injury and I have considered orthopedic injuries including extremity or spinal injury. This is most consistent with an acute life/limb threatening illness complicated by underlying chronic conditions.  With the patient's presentation of moderate mechanism trauma but an otherwise reassuring exam, patient warrants targeted evaluation for potential traumatic injuries. Will proceed with targeted evaluation for potential injuries. Will proceed with CT Head, Cervical Spine CT, and Chest/Pelvis XR.   Additionally, patient with pain at right lower extremity. Will evaluate with targeted right lower extremity x-ray XR. Images reviewed and agree with radiology interpretation.  CT Chest Wo Contrast  Result Date: 11/26/2021 CLINICAL DATA:  Chest trauma.  Fell last evening. EXAM: CT CHEST WITHOUT CONTRAST TECHNIQUE: Multidetector CT imaging of the chest was performed following the standard protocol without IV contrast. RADIATION DOSE REDUCTION: This exam was performed according to the departmental dose-optimization program which includes automated exposure control, adjustment of the mA and/or kV according to patient size and/or use of iterative reconstruction technique. COMPARISON:  None Available. FINDINGS: Cardiovascular: The heart is normal in size. No pericardial effusion. The aorta is normal in caliber. Minimal scattered atherosclerotic calcifications for age. Scattered three-vessel coronary artery calcifications are noted. Mediastinum/Nodes: Small scattered mediastinal and hilar lymph nodes but no mass or overt adenopathy. The esophagus is grossly normal. Lungs/Pleura: Underlying  emphysematous changes and pulmonary scarring. Mosaic pattern of ground-glass attenuation typically seen with small airways disease such as asthma or bronchiolitis. No focal pulmonary infiltrates or pleural effusions. Bibasilar scarring changes. Scattered sub 5 mm pulmonary nodules are likely benign. Subpleural atelectasis or scarring noted adjacent to large right-sided thoracic spine spurs. The central tracheobronchial tree is unremarkable. Upper Abdomen: No significant upper abdominal findings. Musculoskeletal: Severe compression deformity of T8 is a stable finding when compared to prior chest x-ray from February 2023. T12 fracture appears acute. No sternal or rib fractures. IMPRESSION: 1. T12 fracture appears acute. 2. Stable remote severe compression deformity of T8. 3. Underlying emphysematous changes and pulmonary scarring. 4. Mosaic pattern of ground-glass attenuation typically seen with small airways disease such as asthma or bronchiolitis. 5. Small scattered sub 5 mm pulmonary nodules, likely benign. Follow-up noncontrast chest CT in 6-12 months is suggested. Emphysema (ICD10-J43.9). Electronically Signed   By: Marijo Sanes M.D.   On: 11/26/2021 14:58   CT Lumbar Spine Wo Contrast  Result Date: 11/26/2021 CLINICAL DATA:  Back pain.  Fell last evening. EXAM: CT LUMBAR SPINE WITHOUT CONTRAST TECHNIQUE: Multidetector CT imaging of the lumbar spine was performed without intravenous contrast administration. Multiplanar CT image reconstructions were also generated. RADIATION DOSE REDUCTION: This exam was performed according to the departmental dose-optimization program which includes automated exposure control, adjustment of the mA and/or kV according to patient size and/or use of iterative reconstruction technique. COMPARISON:  Lumbar spine radiographs 08/30/2020 FINDINGS: Segmentation: There are five lumbar type vertebral bodies. The last full intervertebral disc space is labeled L5-S1. Alignment:  Normal  alignment of the lumbar vertebral bodies. The facets are also normally aligned. Vertebrae: Diffuse and fairly marked osteoporosis. Mild superior endplate depression of L3 is stable since the prior radiographs. No acute lumbar spine compression fracture. There is a compression deformity of T12. I do not see this for certain on the prior lumbar spine plain films. This is likely an acute compression fracture. No retropulsion or canal compromise. Paraspinal and other soft tissues: No significant paraspinal findings. I do not see an obvious paraspinal hematoma at T12. Disc levels: T12-L1: No significant findings. L1-2: Diffuse annular bulge and mild facet disease contributing to mild bilateral lateral recess stenosis. L2-3: Moderate facet disease and bulging annulus with mild spinal and bilateral lateral recess stenosis. L3-4: Diffuse bulging annulus, facet disease and ligamentum flavum thickening contributing to mild to moderate spinal and bilateral lateral recess stenosis. L4-5: Diffuse bulging degenerated annulus, facet disease and ligamentum flavum thickening contributing to moderately severe spinal and bilateral lateral recess stenosis. No significant foraminal stenosis. L5-S1: Bulging degenerated annulus and severe facet disease with mild spinal and bilateral lateral recess stenosis. No foraminal stenosis. IMPRESSION: 1. Acute compression fracture of T12 without retropulsion or canal compromise. 2. Diffuse and fairly marked osteoporosis. 3. Multilevel spinal and lateral recess stenosis as discussed above. Electronically Signed   By: Marijo Sanes M.D.   On: 11/26/2021 14:49   CT HEAD WO CONTRAST (5MM)  Result Date: 11/26/2021 CLINICAL DATA:  Head trauma, minor (Age >= 65y); Polytrauma, blunt EXAM: CT HEAD WITHOUT CONTRAST CT CERVICAL SPINE WITHOUT CONTRAST TECHNIQUE: Multidetector CT imaging of the head and cervical spine was performed following the standard protocol without intravenous contrast. Multiplanar  CT image reconstructions of the cervical spine were also generated. RADIATION DOSE REDUCTION: This exam was performed according to the departmental dose-optimization program which includes automated exposure control, adjustment of the mA and/or kV according to patient size and/or use of iterative reconstruction technique. COMPARISON:  None Available. FINDINGS: CT HEAD FINDINGS Brain: No evidence of acute infarction, hemorrhage, hydrocephalus, extra-axial collection or mass lesion/mass effect. Vascular: Calcific atherosclerosis. Skull: No acute fracture. Sinuses/Orbits: Clear sinuses.  No acute orbital findings. Other: No mastoid effusions. CT CERVICAL SPINE FINDINGS Alignment: No substantial sagittal subluxation. Skull base and vertebrae: No evidence of acute fracture. Soft tissues and spinal canal: No prevertebral fluid or swelling. No visible canal hematoma. Disc levels:  Moderate multilevel degenerative change. Upper chest: Visualized lung apices are clear. IMPRESSION: 1. No evidence of acute intracranial abnormality. 2. No evidence of acute fracture or traumatic malalignment in the cervical spine. Electronically Signed   By: Margaretha Sheffield M.D.   On: 11/26/2021 14:43   CT CERVICAL SPINE WO CONTRAST  Result Date: 11/26/2021 CLINICAL DATA:  Head trauma, minor (Age >= 65y); Polytrauma, blunt EXAM: CT HEAD WITHOUT CONTRAST CT CERVICAL SPINE WITHOUT CONTRAST TECHNIQUE: Multidetector CT imaging of the head and cervical spine was performed following the standard protocol without intravenous contrast. Multiplanar CT image reconstructions of the cervical spine were also generated. RADIATION DOSE REDUCTION: This exam was performed according to the departmental dose-optimization program which includes automated exposure control, adjustment of the mA and/or kV according to patient size and/or use of iterative reconstruction technique. COMPARISON:  None Available. FINDINGS: CT HEAD FINDINGS Brain: No evidence of  acute infarction, hemorrhage, hydrocephalus, extra-axial collection or mass lesion/mass effect. Vascular: Calcific atherosclerosis. Skull: No acute fracture. Sinuses/Orbits: Clear sinuses.  No acute orbital findings. Other: No mastoid effusions. CT CERVICAL SPINE FINDINGS Alignment: No substantial sagittal subluxation. Skull  base and vertebrae: No evidence of acute fracture. Soft tissues and spinal canal: No prevertebral fluid or swelling. No visible canal hematoma. Disc levels:  Moderate multilevel degenerative change. Upper chest: Visualized lung apices are clear. IMPRESSION: 1. No evidence of acute intracranial abnormality. 2. No evidence of acute fracture or traumatic malalignment in the cervical spine. Electronically Signed   By: Margaretha Sheffield M.D.   On: 11/26/2021 14:43   DG Knee Complete 4 Views Right  Result Date: 11/26/2021 CLINICAL DATA:  Right knee pain after fall. EXAM: RIGHT KNEE - COMPLETE 4+ VIEW COMPARISON:  None Available. FINDINGS: Minimally displaced proximal right fibular fracture is noted. Moderate degenerative changes are seen involving the medial, lateral and patellofemoral spaces of the right knee. IMPRESSION: Minimally displaced proximal right fibular fracture. Electronically Signed   By: Marijo Conception M.D.   On: 11/26/2021 13:57   DG Pelvis Portable  Result Date: 11/26/2021 CLINICAL DATA:  Fall. EXAM: PORTABLE PELVIS 1-2 VIEWS COMPARISON:  None Available. FINDINGS: There is no evidence of pelvic fracture or diastasis. No pelvic bone lesions are seen. IMPRESSION: Negative. Electronically Signed   By: Marijo Conception M.D.   On: 11/26/2021 13:55    Final Reassessment and Plan:   Patient with 2 acute fractures, acute thoracic spine fracture and acute right lower extremity fibular fracture.  She is neurovascularly intact.  I consulted orthopedic surgery.  They stated that the fibula fracture would be nonoperative and the patient could be placed in a knee knee immobilizer for  comfort.  Unfortunately, patient is completely unable to ambulate.  At baseline, she is a fall risk, patient has 2 daughters at bedside who she lives with and provides most of her ADLs.  They do not feel that they are able to safely take care of her at home given her new mobility limitations.  Given acute fractures, I do believe patient would warrant acute physical therapy consultation and transfer for further evaluation.  Consulted hospitalist who agrees with need for admission given her falls and multiple fractures. ***    Clinical Impression:  1. Fall, initial encounter      Admit   Final Clinical Impression(s) / ED Diagnoses Final diagnoses:  Fall, initial encounter    Rx / DC Orders ED Discharge Orders     None

## 2021-11-26 NOTE — Assessment & Plan Note (Signed)
Continue metoprolol. 

## 2021-11-26 NOTE — Assessment & Plan Note (Addendum)
-  Rate controlled with metoprolol -Continue Eliquis

## 2021-11-26 NOTE — Assessment & Plan Note (Signed)
-  Continue sertraline, Ativan

## 2021-11-26 NOTE — Consult Note (Signed)
ORTHOPAEDIC CONSULTATION  REQUESTING PHYSICIAN: [name], MD  Chief Complaint: Right ankle pain after a fall  HPI: Carmen Haney is a 88 y.o. female who complains of overall deconditioning with right ankle pain, right knee pain, left knee pain, right shoulder pain, mild back pain.  She has had progressive deterioration in her overall function, complicated by substantial depression.  She had known arthritic changes in both of his knees, and has had injections performed before, probably more than 6 months ago.  Over the last week or 2 she has progressively lost the capacity to ambulate, she says that she can walk a little bit but her daughter says its gone to almost nothing at this point.  Pain is moderate to severe over the right ankle as well as the right knee.  Past Medical History:  Diagnosis Date   ACE-inhibitor cough    Breast CA ([place]) 2010   s/p lumpectomy & radiation   CAD (coronary artery disease) 07/1998   s/p cath in 2000. L main with 30 to 40%   Hearing loss    HTN (hypertension)    Hyperlipidemia    Obesities, morbid ([place])    Osteoporosis    Personal history of radiation therapy 2010   Right Breast Cancer   Thyroid disease    Vertebral fracture, pathological    Past Surgical History:  Procedure Laterality Date   BREAST BIOPSY Left    BREAST LUMPECTOMY Right March 2010   BREAST LUMPECTOMY WITH RADIOACTIVE SEED LOCALIZATION Left 09/20/2014   Procedure: LEFT BREAST LUMPECTOMY WITH RADIOACTIVE SEED LOCALIZATION;  Surgeon: Autumn Messing III, MD;  Location: [facility];  Service: General;  Laterality: Left;   CARPAL TUNNEL RELEASE Right    CESAREAN SECTION     EYE SURGERY     Right Eye   Skin Cancer Removed from Right Forearm     TOTAL ABDOMINAL HYSTERECTOMY     Social History   Socioeconomic History   Marital status: Widowed    Spouse name: Tressie Ellis   Number of children: Not on file   Years of education: Not on file   Highest education level: Not  on file  Occupational History   Occupation: Retired from [employer]: RETIRED  Tobacco Use   Smoking status: Former    Types: Cigarettes    Quit date: 01/13/1963    Years since quitting: 58.9   Smokeless tobacco: Never   Tobacco comments:    stopped in 1970's  Vaping Use   Vaping Use: Never used  Substance and Sexual Activity   Alcohol use: No    Alcohol/week: 0.0 standard drinks of alcohol   Drug use: No   Sexual activity: Not on file  Other Topics Concern   Not on file  Social History Narrative   Not on file   Social Determinants of Health   Financial Resource Strain: Not on file  Food Insecurity: Not on file  Transportation Needs: Not on file  Physical Activity: Not on file  Stress: Not on file  Social Connections: Not on file   Family History  Problem Relation Age of Onset   Coronary artery disease Mother    Hypertension Mother    Melanoma Mother    Cancer Mother 18       breast   Breast cancer Mother    Cancer Father 83       prostate   Cancer Sister 77       breast  Breast cancer Sister    Cancer Maternal Aunt 51       breast   Cancer Sister 14       breast   Breast cancer Sister    Breast cancer Daughter 26   Allergies  Allergen Reactions   Ace Inhibitors Cough   Latex Rash     Positive ROS: All other systems have been reviewed and were otherwise negative with the exception of those mentioned in the HPI and as above.  Physical Exam: General: Alert, no acute distress Cardiovascular: Moderate bilateral pedal edema right greater than left Respiratory: No cyanosis, no use of accessory musculature GI: No organomegaly, abdomen is soft and non-tender Skin: She has some ecchymosis over the right medial ankle more so than the left Neurologic: Sensation intact distally Psychiatric: Patient is competent for consent with normal mood and affect Lymphatic: No axillary or cervical lymphadenopathy  MUSCULOSKELETAL: Right ankle has tenderness to  palpation medially.  She also has pain to palpation diffusely around both of her knees.  She cannot do a straight leg raise.  I could not really assess her motion very 6 substantially.  Her right shoulder has 0 to 120 degrees of motion.  X-ray of the right knee demonstrates a nondisplaced proximal fibula fracture.  2 views of the right ankle demonstrate some slight irregularity medially, the mortise is intact, the syndesmosis looks stable. 2 views of the right shoulder demonstrate intact bony architecture X-rays of the left knee demonstrate degenerative changes CT scan of the spine demonstrates a T12 compression fracture.  The family says that that is old.  Assessment: Principal Problem:   Fall at home, initial encounter Active Problems:   HTN (hypertension)   Hypothyroidism (acquired)   Anxiety and depression   Atrial fibrillation (HCC)   Right proximal fibula fracture, possible associated Maisonneuve variant, with some ankle pain likely old T12 compression fracture Right shoulder pain, probable chronic rotator cuff tear Bilateral knee osteoarthritis  Plan: She has multiple different musculoskeletal problems, the most substantial of which is her deconditioning and failure to maintain ambulatory function.  This recent fall certainly is going to set her back.  I am going to get her a cam boot for the right lower extremity, she does not need to have this on when she is in bed, but when she is ambulating it may help to support her pain.  I do not think she needs a knee immobilizer.  She can use the right upper extremity as tolerated, and also lower extremity as tolerated.  We will plan to inject both of her knees, and hopefully manage some of her arthritic pain in order to keep her mobile.  She is really not very much a surgical candidate, to see if we can maintain some level of function through physical therapy.  Appreciate the internal medicine hospitalist team assistance in her  care.    Johnny Bridge, MD Cell [phone]   11/26/2021 9:27 PM

## 2021-11-27 ENCOUNTER — Inpatient Hospital Stay (HOSPITAL_COMMUNITY): Payer: Medicare PPO

## 2021-11-27 ENCOUNTER — Encounter (HOSPITAL_COMMUNITY): Payer: Self-pay | Admitting: Internal Medicine

## 2021-11-27 DIAGNOSIS — S22080A Wedge compression fracture of T11-T12 vertebra, initial encounter for closed fracture: Secondary | ICD-10-CM | POA: Diagnosis present

## 2021-11-27 DIAGNOSIS — R55 Syncope and collapse: Secondary | ICD-10-CM

## 2021-11-27 DIAGNOSIS — S82831A Other fracture of upper and lower end of right fibula, initial encounter for closed fracture: Secondary | ICD-10-CM | POA: Diagnosis not present

## 2021-11-27 DIAGNOSIS — M25462 Effusion, left knee: Secondary | ICD-10-CM | POA: Insufficient documentation

## 2021-11-27 LAB — COMPREHENSIVE METABOLIC PANEL
ALT: 13 U/L (ref 0–44)
AST: 14 U/L — ABNORMAL LOW (ref 15–41)
Albumin: 3.1 g/dL — ABNORMAL LOW (ref 3.5–5.0)
Alkaline Phosphatase: 56 U/L (ref 38–126)
Anion gap: 5 (ref 5–15)
BUN: 14 mg/dL (ref 8–23)
CO2: 28 mmol/L (ref 22–32)
Calcium: 8.5 mg/dL — ABNORMAL LOW (ref 8.9–10.3)
Chloride: 104 mmol/L (ref 98–111)
Creatinine, Ser: 0.76 mg/dL (ref 0.44–1.00)
GFR, Estimated: >60 mL/min (ref >60)
Glucose, Bld: 111 mg/dL — ABNORMAL HIGH (ref 70–99)
Potassium: 4.2 mmol/L (ref 3.5–5.1)
Sodium: 137 mmol/L (ref 135–145)
Total Bilirubin: 0.9 mg/dL (ref 0.3–1.2)
Total Protein: 5.9 g/dL — ABNORMAL LOW (ref 6.5–8.1)

## 2021-11-27 LAB — CBC WITH DIFFERENTIAL/PLATELET
Abs Immature Granulocytes: 0.02 10*3/uL (ref 0.00–0.07)
Basophils Absolute: 0 10*3/uL (ref 0.0–0.1)
Basophils Relative: 0 %
Eosinophils Absolute: 0.1 10*3/uL (ref 0.0–0.5)
Eosinophils Relative: 2 %
HCT: 35.7 % — ABNORMAL LOW (ref 36.0–46.0)
Hemoglobin: 11.5 g/dL — ABNORMAL LOW (ref 12.0–15.0)
Immature Granulocytes: 0 %
Lymphocytes Relative: 20 %
Lymphs Abs: 1.2 10*3/uL (ref 0.7–4.0)
MCH: 30.3 pg (ref 26.0–34.0)
MCHC: 32.2 g/dL (ref 30.0–36.0)
MCV: 93.9 fL (ref 80.0–100.0)
Monocytes Absolute: 0.8 10*3/uL (ref 0.1–1.0)
Monocytes Relative: 14 %
Neutro Abs: 3.7 10*3/uL (ref 1.7–7.7)
Neutrophils Relative %: 64 %
Platelets: 204 10*3/uL (ref 150–400)
RBC: 3.8 MIL/uL — ABNORMAL LOW (ref 3.87–5.11)
RDW: 14.3 % (ref 11.5–15.5)
WBC: 5.8 10*3/uL (ref 4.0–10.5)
nRBC: 0 % (ref 0.0–0.2)

## 2021-11-27 LAB — URINALYSIS, COMPLETE (UACMP) WITH MICROSCOPIC
Bacteria, UA: NONE SEEN
Bilirubin Urine: NEGATIVE
Glucose, UA: NEGATIVE mg/dL
Ketones, ur: NEGATIVE mg/dL
Leukocytes,Ua: NEGATIVE
Nitrite: NEGATIVE
Protein, ur: NEGATIVE mg/dL
Specific Gravity, Urine: 1.01 (ref 1.005–1.030)
pH: 6 (ref 5.0–8.0)

## 2021-11-27 LAB — PROTIME-INR
INR: 1.2 (ref 0.8–1.2)
Prothrombin Time: 15.1 seconds (ref 11.4–15.2)

## 2021-11-27 LAB — TSH: TSH: 0.434 u[IU]/mL (ref 0.350–4.500)

## 2021-11-27 LAB — SEDIMENTATION RATE: Sed Rate: 35 mm/hr — ABNORMAL HIGH (ref 0–22)

## 2021-11-27 LAB — VITAMIN D 25 HYDROXY (VIT D DEFICIENCY, FRACTURES): Vit D, 25-Hydroxy: 36.1 ng/mL (ref 30–100)

## 2021-11-27 LAB — MAGNESIUM: Magnesium: 2.1 mg/dL (ref 1.7–2.4)

## 2021-11-27 MED ORDER — ENSURE ENLIVE PO LIQD
237.0000 mL | ORAL | Status: DC
  Administered 2021-11-27 – 2021-11-30 (×3): 237 mL via ORAL

## 2021-11-27 NOTE — Progress Notes (Addendum)
Carotid artery duplex has been completed. Preliminary results can be found in CV Proc through chart review.   11/27/21 9:36 AM Carlos Levering RVT

## 2021-11-27 NOTE — Progress Notes (Signed)
Orthopedic Tech Progress Note Patient Details:  Carmen Haney [birth year] [id number]  Patient ID: Carmen Haney, female   DOB: 07-10-1930, 88 y.o.   MRN: [id number]  Carmen Haney 11/27/2021, 9:39 AM Cam walker applied to right leg. For use when OOB. Family instructed on fit and use.

## 2021-11-27 NOTE — Progress Notes (Signed)
[facility] Progress Note Patient: Carmen Haney WJX:[id number] DOB: 04/11/1930 DOA: 11/26/2021  DOS: the patient was seen and examined on 11/27/2021  Brief hospital course: PMH of PAF, compression fracture, hypothyroidism, HLD, CAD, breast cancer S/P lumpectomy.  Presented to hospital with fall that led to right proximal fibular fracture.  Orthopedic consulted.  Recommend conservative management.  Continue pain control.  PT OT recommended SNF. Assessment and Plan: Acute right proximal fibular fracture. Orthopedic was consulted. Dr. Mardelle Matte recommended conservative measures. Patient does not require any immobilizer by them. Currently in a cam walker boot. PT OT recommends SNF. WBAT as tolerated. Orthopedic patient is not surgical candidate and recommends to maintain physical therapy only so that some level of functioning can be maintained.  Chronic compression fracture T8, T12. Managed medically. Patient does not want to undergo any procedure intervention for this.  Fall. Family reports generalized weakness ongoing for last 3 weeks. No change in medications reported. Patient does not have frequent falls. Her last fall was actually 1 year ago. Patient does not have any focal deficit at the time of my evaluation. Cerebellar function intact. No asterixis. Does not appear to be on any medication that can limit with the frequent fall. Does appear to have some restless leg syndrome. Patient does report incontinent episode for last few days, CT of the head does not show any evidence of hydrocephalus. Does not have any evidence of infection right now. Check orthostatic vitals.  Check abdomen Dopplers.  Monitor for now.  Hypothyroidism. TSH adequate. Continue Synthroid.  Paroxysmal A-fib on chronic anticoagulation. Currently appears to be in sinus rhythm. Continue rate controlling medication. Patient does not appear to have any frequent falls and therefore currently would not  discuss regarding stopping the anticoagulation. Family is aware that if the patient continues to have falls in the future she may require to stop anticoagulation.  Chronic rotator cuff tear. Management per orthopedics.  Bilateral knee arthritis Orthopedic performed intra-articular steroid injection on 11/16.  Chronic diastolic CHF. Does not appear to be volume overloaded. For now we will monitor.  Depression. Patient is on Zoloft. 200 mg appears to be rather high dose but although patient appears to be requiring this dose and therefore we will continue it for now.  Pulmonary nodule Scattered sub-5 mm pulmonary nodules.  Repeat CT scan in 6 to 12 months is suggested.  Emphysema Continue albuterol as needed.  Possible restless leg syndrome. Patient reports that she has been noticing jerking movement of her right lower extremity for last few weeks. Might consider restless leg syndrome therapy with [facility]. Recommend outpatient sleep study.  Subjective: Continues to have pain.  No nausea no vomiting no fever no chills.  No chest pain.  No focal deficit.  Physical Exam: General: in mild distress;  Cardiovascular: S1 and S2 Present, no Murmur Respiratory: good respiratory effort, Bilateral Air entry present, no Crackles, no wheezes Abdomen: Bowel Sound present, Non tender  Extremities: no edema Neurology: alert and oriented to time, place, and person   Data Reviewed: I have Reviewed nursing notes, Vitals, and Lab results. Since last encounter, pertinent lab results CBC and BMP   . I have ordered test including CBC, BMP, Doppler carotid  .   Disposition: Status is: Inpatient Remains inpatient appropriate because: Unsafe discharge.  Need SNF.  apixaban (ELIQUIS) tablet 5 mg Start: 11/26/21 2200 apixaban (ELIQUIS) tablet 5 mg   Family Communication: Daughter at bedside Level of care: Med-Surg  Vitals:   11/27/21 1001 11/27/21 1035 11/27/21  1048 11/27/21 1403  BP: (!) 115/49  (!) 136/52 133/70 118/64  Pulse: 65 68 70 69  Resp: 18   18  Temp: 98.5 F (36.9 C)   98.2 F (36.8 C)  TempSrc: Oral   Oral  SpO2: 93%   96%  Weight:      Height:         Author: Berle Mull, MD 11/27/2021 4:55 PM  Please look on [email or web address] to find out who is on call.

## 2021-11-27 NOTE — Evaluation (Signed)
Occupational Therapy Evaluation Patient Details Name: Carmen Haney MRN: [id number] DOB: [birth year] Today's Date: 11/27/2021   History of Present Illness Carmen Haney is a 88 y.o. 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 Per ortho note, pt is non-operative and use in a knee immobilizer/CAM walker for comfort PMH includes: Anxiety, HTN, A-fib, falls, hypothyroidism, breast ca.   Clinical Impression   Pt admitted as above presenting with deficits as listed below (refer to OT problem list below for details).  Pt is currently max/total assist +2 for sit to stand, however she was Min-min guard assist to scoot from EOB to drop arm recliner at bedside. Pt anxiety is limiting factor at this time impacting her ability to perform ADL's and functional mobility/transfers related to ADL's and self care tasks. She should benefit from acute OT to assist in maximizing independence with ADL's/functional mobility prior to anticipated d/c to SNF Rehab.   Recommendations for follow up therapy are one component of a multi-disciplinary discharge planning process, led by the attending physician.  Recommendations may be updated based on patient status, additional functional criteria and insurance authorization.   Follow Up Recommendations  Skilled nursing-short term rehab (<3 hours/day)     Assistance Recommended at Discharge Frequent or constant Supervision/Assistance  Patient can return home with the following Two people to help with walking and/or transfers;Two people to help with bathing/dressing/bathroom;Assistance with cooking/housework;Help with stairs or ramp for entrance;Assist for transportation    Functional Status Assessment  Patient has had a recent decline in their functional status and demonstrates the  ability to make significant improvements in function in a reasonable and predictable amount of time.  Equipment Recommendations  Other (comment) (Defer to next venue)    Recommendations for Other Services       Precautions / Restrictions Precautions Precautions: Fall Precaution Comments: Multiple falls over last 3-4 weeks per pt/daughter's Required Braces or Orthoses: Other Brace Other Brace: R LE CAM walker for comfort/PRN Restrictions Weight Bearing Restrictions: Yes RUE Weight Bearing: Weight bearing as tolerated RLE Weight Bearing: Weight bearing as tolerated      Mobility Bed Mobility Overal bed mobility: Needs Assistance Bed Mobility: Supine to Sit     Supine to sit: Min assist, HOB elevated     General bed mobility comments: Min A LE's to advance off EOB, increased time Patient Response: Anxious  Transfers Overall transfer level: Needs assistance Equipment used: Rolling walker (2 wheels) Transfers: Sit to/from Stand, Bed to chair/wheelchair/BSC Sit to Stand: Max assist, Total assist, +2 safety/equipment, +2 physical assistance, From elevated surface    Lateral/Scoot Transfers: Min guard, Min assist General transfer comment: Pt anxiety is limiting factor at this time - pt performed sit to stand from EOB x3, during which she would spontaneously let go of RW and lean posteriorly likely secondary to fear of falling. Pt was then able to perform lateral scoot to drop arm recliner with Min guard/min assist. Recommend use of lift equipment or lateral scooting for pt/staff safety at this time.      Balance Overall balance assessment: Needs assistance Sitting-balance support: Single extremity supported, Bilateral upper extremity supported, Feet supported Sitting balance-Leahy Scale: Good   Standing balance support: Bilateral upper extremity supported, No upper extremity supported (+2 assist) Standing balance-Leahy Scale: Zero Standing balance comment: Sit to stand x3. Pt  noted to let go  of RW and lean posteriorly on all 3 attempts despite maximal verbal and tactile cues on DME use and +2 physical assist.     ADL either performed or assessed with clinical judgement   ADL Overall ADL's : Needs assistance/impaired Eating/Feeding: Modified independent;Bed level;Sitting   Grooming: Set up;Sitting   Upper Body Bathing: Set up;Sitting   Lower Body Bathing: Total assistance;+2 for physical assistance;+2 for safety/equipment;Bed level;Sitting/lateral leans;Sit to/from stand   Upper Body Dressing : Set up;Sitting   Lower Body Dressing: Maximal assistance;+2 for physical assistance;Total assistance;Sitting/lateral leans;Sit to/from stand;Bed level   Toilet Transfer: Total assistance;+2 for physical assistance;+2 for safety/equipment;Stand-pivot;BSC/3in1   Toileting- Clothing Manipulation and Hygiene: +2 for physical assistance;+2 for safety/equipment;Sitting/lateral lean;Sit to/from stand;Bed level;Maximal assistance;Total assistance   Tub/Shower Transfer Details (indicate cue type and reason): NT Functional mobility during ADLs:  (Pt is currently max/total assist, recommend lift equipment for pt/staff safety as anxiety/fear of falling appears to be limiting factor) General ADL Comments: Pt was seen for OT assessment and was educated in role of OT and recommendations/PLOF. Pt is currently total assist +2 for sit to stand, however she was Min-min guard assist to scoot from EOB to drop arm recliner at bedside. Pt anxiety is limiting factor at this time. Will follow acutely. Discussed SNF Rehab with pt/adult daughters whom were present during this session     Vision Ability to See in Adequate Light: 0 Adequate Vision Assessment?: No apparent visual deficits     Perception     Praxis      Pertinent Vitals/Pain Pain Assessment Pain Assessment: Faces Faces Pain Scale: Hurts even more Pain Location: bilateral LE's R>L and UE's Pain Descriptors / Indicators:  Guarding, Grimacing, Moaning, Other (Comment) Pain Intervention(s): Limited activity within patient's tolerance, Monitored during session, Repositioned, Other (comment) (repositioned gait belt)     Hand Dominance Right   Extremity/Trunk Assessment Upper Extremity Assessment Upper Extremity Assessment: Overall WFL for tasks assessed;RUE deficits/detail RUE Deficits / Details: Active ROM is WFL bilateral UE's however, pt with c/o soreness in right shoulder with end range flexion noted RUE: Shoulder pain with ROM   Lower Extremity Assessment Lower Extremity Assessment: Defer to PT evaluation     Communication Communication Communication: HOH   Cognition Arousal/Alertness: Awake/alert Behavior During Therapy: Anxious, Impulsive Overall Cognitive Status: Within Functional Limits for tasks assessed   General Comments: Pt anxiety appears to be limiting factor at this time. Pt is fearfull of movement and was noted to let go of RW in standing x3 and with c/o tightness where gait belt was positioned.     General Comments  Anxiety and fear of falling limiting transfers and moblity at this time            [place] expects to be discharged to:: Private residence Living Arrangements: Children Available Help at Discharge: Family;Available PRN/intermittently Type of Home: House Home Access: Stairs to enter [employer] of Steps: 4 Entrance Stairs-Rails: Right;Left Home Layout: Two level;Able to live on main level with bedroom/bathroom     Bathroom Shower/Tub: Tub/shower unit   Bathroom Toilet: Standard (Toilet riser)     Home Equipment: Rollator (4 wheels);BSC/3in1;Tub bench;Toilet riser;Transport chair;Hospital bed   Additional Comments: Per pt daughter, pt bathroom is small and can accommodate rollator but is tight with tub bench in place      Prior Functioning/Environment Prior Level of Function : Needs assist   Physical Assist : Mobility  (physical)   Mobility Comments: Pt has been ambulating less and less  over last 3-4 weeks. She uses rollator for household  mobility.Reports multiple falls over last 3-4 weeks ADLs Comments: Per pt and daughter report, pt has DME but decreased mobility and h/o multiple falls has increased need for assistance with ADL's over last 3-4 weeks.        OT Problem List: Impaired balance (sitting and/or standing);Decreased knowledge of precautions;Pain;Decreased safety awareness;Obesity;Decreased activity tolerance;Decreased knowledge of use of DME or AE      OT Treatment/Interventions: Self-care/ADL training;Patient/family education;Balance training;Therapeutic activities;DME and/or AE instruction    OT Goals(Current goals can be found in the care plan section) Acute Rehab OT Goals Patient Stated Goal: I want to help, I just can't OT Goal Formulation: With patient/family Time For Goal Achievement: 12/11/21 Potential to Achieve Goals: Fair  OT Frequency: Min 2X/week    Co-evaluation PT/OT/SLP Co-Evaluation/Treatment: Yes Reason for Co-Treatment: Complexity of the patient's impairments (multi-system involvement);For patient/therapist safety;To address functional/ADL transfers PT goals addressed during session: Mobility/safety with mobility OT goals addressed during session: ADL's and self-care;Other (comment) (Functional transfers in preparation for increased participation in ADL's)      AM-PAC OT "6 Clicks" Daily Activity     Outcome Measure Help from another person eating meals?: None Help from another person taking care of personal grooming?: A Little Help from another person toileting, which includes using toliet, bedpan, or urinal?: Total Help from another person bathing (including washing, rinsing, drying)?: A Lot Help from another person to put on and taking off regular upper body clothing?: A Little Help from another person to put on and taking off regular lower body clothing?:  Total 6 Click Score: 14   End of Session Equipment Utilized During Treatment: Gait belt;Rolling walker (2 wheels);Other (comment) (CAM boot R LE for comfort) Nurse Communication: Mobility status;Need for lift equipment;Other (comment) (Lift equipment vs lateral scoot using drop arm recliner for pt/staff safety)  Activity Tolerance: Other (comment) (Limited secondary to anxiety) Patient left: in chair;with call bell/phone within reach;with chair alarm set;with family/visitor present;with nursing/sitter in room  OT Visit Diagnosis: Other abnormalities of gait and mobility (R26.89);History of falling (Z91.81);Pain Pain - Right/Left: Right Pain - part of body: Shoulder;Leg                Time: 1224-8250 OT Time Calculation (min): 41 min Charges:  OT General Charges $OT Visit: 1 Visit OT Evaluation $OT Eval Low Complexity: 1 Low  Carmen Haney, OTR/L 11/27/2021, 12:03 PM

## 2021-11-27 NOTE — TOC Initial Note (Signed)
Transition of Care [facility]) - Initial/Assessment Note    Patient Details  Name: Carmen Haney MRN: [id number] Date of Birth: 18-Oct-1930  Transition of Care [facility]) CM/SW Contact:    Lennart Pall, LCSW Phone Number: 11/27/2021, 1:53 PM  Clinical Narrative:                 Met with pt and two daughters today to introduce TOC/ CSW role and review dc plans.  All are agreed with therapy recommendations for SNF rehab prior to pt return home where she lives with daughter.  We discussed the SNF placement process and their preferences.  Bed search initiated and await offers/ choice from family as well as insurance auth.  Expected Discharge Plan: Skilled Nursing Facility Barriers to Discharge: Insurance Authorization, SNF Pending bed offer   Patient Goals and CMS Choice Patient states their goals for this hospitalization and ongoing recovery are:: return home following SNF rehab CMS Medicare.gov Compare Post Acute Care list provided to:: Patient Choice offered to / list presented to : Patient, Adult Children  Expected Discharge Plan and Services Expected Discharge Plan: [place] In-house Referral: Clinical Social Work   Post Acute Care Choice: [place] Living arrangements for the past 2 months: [place]                 DME Arranged: N/A DME Agency: NA                  Prior Living Arrangements/Services Living arrangements for the past 2 months: Single Family Home Lives with:: Adult Children Patient language and need for interpreter reviewed:: Yes Do you feel safe going back to the place where you live?: Yes      Need for Family Participation in Patient Care: Yes (Comment) Care giver support system in place?: Yes (comment)   Criminal Activity/Legal Involvement Pertinent to Current Situation/Hospitalization: No - Comment as needed  Activities of Daily Living Home Assistive Devices/Equipment: None ADL Screening (condition at time of  admission) Patient's cognitive ability adequate to safely complete daily activities?: Yes Is the patient deaf or have difficulty hearing?: Yes Does the patient have difficulty seeing, even when wearing glasses/contacts?: No Does the patient have difficulty concentrating, remembering, or making decisions?: No Patient able to express need for assistance with ADLs?: Yes Does the patient have difficulty dressing or bathing?: Yes Independently performs ADLs?: No Communication: Independent Dressing (OT): Needs assistance Is this a change from baseline?: Change from baseline, expected to last <3days Grooming: Needs assistance Is this a change from baseline?: Pre-admission baseline Feeding: Independent Bathing: Needs assistance Is this a change from baseline?: Change from baseline, expected to last <3 days Toileting: Needs assistance Is this a change from baseline?: Change from baseline, expected to last <3 days In/Out Bed: Needs assistance Is this a change from baseline?: Change from baseline, expected to last <3 days Walks in Home: Independent with device (comment) Is this a change from baseline?: Change from baseline, expected to last <3 days Does the patient have difficulty walking or climbing stairs?: Yes Weakness of Legs: Both Weakness of Arms/Hands: None  Permission Sought/Granted Permission sought to share information with : Family Supports, Chartered certified accountant granted to share information with : Yes, Verbal Permission Granted  Share Information with NAME: Carmen Haney     Permission granted to share info w Relationship: daughter  Permission granted to share info w Contact Information: [phone]  Emotional Assessment Appearance:: Appears stated age Attitude/Demeanor/Rapport: Gracious Affect (typically  observed): Accepting Orientation: : Oriented to Self, Oriented to Place, Oriented to  Time, Oriented to Situation Alcohol / Substance Use: Not  Applicable Psych Involvement: No (comment)  Admission diagnosis:  Fall, initial encounter [W19.XXXA] Fall at home, initial encounter ([phone].Merril Abbe, G64.847] Patient Active Problem List   Diagnosis Date Noted   Fracture of right proximal fibula 11/27/2021   T12 compression fracture ([place]) 11/27/2021   Effusion of bursa of left knee 11/27/2021   Fall 20/72/1828   Diastolic dysfunction 83/37/4451   Hemangioma of skin and subcutaneous tissue 02/19/2021   History of malignant neoplasm of skin 02/19/2021   Intertrigo 02/19/2021   Other seborrheic keratosis 02/19/2021   Atrial fibrillation ([place]) 02/16/2021   Atrial fibrillation with RVR ([place]) 02/15/2021   Degeneration of lumbar intervertebral disc 10/02/2020   Low back pain 10/02/2020   Personal history of colonic polyps 08/21/2020   Other specified symptoms and signs involving the circulatory and respiratory systems 08/21/2020   Other abnormalities of gait and mobility 08/21/2020   Anxiety and depression 08/21/2020   Chronic kidney disease (CKD), stage III (moderate) ([place]) 08/21/2020   Age-related osteoporosis without current pathological fracture 08/21/2020   Abnormal digestive tract function 08/21/2020   Chronic pain of right knee 08/15/2019   Vertebral fracture, pathological    Hypothyroidism (acquired)    Personal history of radiation therapy    Breast CA ([place])    Genetic testing 10/22/2016   Sensorineural hearing loss (SNHL), bilateral 07/19/2015   Anxiety disorder 07/19/2015   Sclerosing adenosis of left breast 11/15/2014   Bruit 08/03/2013   Family history of malignant neoplasm of breast 07/28/2013   Neoplasm of right breast, primary tumor staging category Tis: ductal carcinoma in situ (DCIS) 10/20/2012   Obesities, morbid ([place])    HTN (hypertension)    Hyperlipidemia    CAD (coronary artery disease) 07/13/1998   PCP:  Kelton Pillar, MD Pharmacy:   [facility] [id number] [address] [place] [address] Phone: [phone] Fax: [phone]     Social Determinants of Health (SDOH) Interventions    Readmission Risk Interventions    11/27/2021    1:51 PM  Readmission Risk Prevention Plan  Transportation Screening Complete  PCP or Specialist Appt within 5-7 Days Complete  Home Care Screening Complete  Medication Review (RN CM) Complete

## 2021-11-27 NOTE — Progress Notes (Signed)
Initial Nutrition Assessment  DOCUMENTATION CODES:   Not applicable  INTERVENTION:  -Continue regular diet as tolerated -Trial Ensure Plus HP q day to provide 350kcal and 20g protein/bottle  NUTRITION DIAGNOSIS:  Predicted suboptimal nutrient intake related to acute illness as evidenced by meal completion < 50%.  GOAL:  Patient will meet greater than or equal to 90% of their needs  MONITOR:  PO intake, Supplement acceptance  REASON FOR ASSESSMENT:  Consult Assessment of nutrition requirement/status  ASSESSMENT:  Pt is an 88yo F with PMH of thyroid disease, osteoporosis, compression fractures, paroxysmal a-fib, CAD, HTN, HLD and hearing loss presents with acute right proximal fibular fracture after frequent falls at home.  Multiple attempts to visit pt at bedside this AM but she was with other medical staff. Per chart review knee injusry is non-operable, plan for cam boot. Pt is tolerating regular diet, but minimal po intake this AM. Weight history in EMR reviewed and shows stable weight x 2 years. Nutrition related lab values are within acceptable limits. Recommend trial of Ensure Plus HP to provide 350kcal and 20g protein to help meet estimated needs. NFPE to be completed on follow up.  Medications reviewed and include: colace, levothyroxine, methylprednisolone  Labs reviewed and within acceptable limits   NUTRITION - FOCUSED PHYSICAL EXAM: Deferred, pt unavailable  Diet Order:   Diet Order             Diet regular Room service appropriate? Yes; Fluid consistency: Thin  Diet effective now                   EDUCATION NEEDS:   Not appropriate for education at this time  Skin:  Skin Assessment: Reviewed RN Assessment  Last BM:  11/15  Height:  Ht Readings from Last 1 Encounters:  11/26/21 '5\' 3"'$  (1.6 m)   Weight:  Wt Readings from Last 1 Encounters:  11/26/21 88 kg    BMI:  Body mass index is 34.37 kg/m.  Estimated Nutritional Needs:  Kcal:   1760-1940kcal Protein:  85-105g Fluid:  1760-194m  KCandise Bowens MS, RD, LDN, CNSC See AMiON for contact information

## 2021-11-27 NOTE — Progress Notes (Signed)
PRE-PROCEDURE DIAGNOSIS:  Bilateral knee pain POST-OPERATIVE DIAGNOSIS:  Same PROCEDURE:  Intra-articular injection bilateral knee  PROCEDURE DETAILS: Right Knee: After informed verbal consent was obtained the superolateral portal was prepped with an alcohol swab   2 ml of Marcaine and 1 ml of Depo-Medrol ('40mg'$ ) was then injected into the joint space. She tolerated this well without complication and a Band-Aid was placed.  Left Knee: After informed verbal consent was obtained the superolateral portal was prepped with an alcohol swab   2 ml of Marcaine and 1 ml of Depo-Medrol ('40mg'$ ) was then injected into the joint space. She tolerated this well without complication and a Band-Aid was placed.  11/27/2021 9:43 AM

## 2021-11-27 NOTE — Plan of Care (Signed)
Problem: Activity: Goal: Risk for activity intolerance will decrease Outcome: Not Progressing   Problem: Safety: Goal: Ability to remain free from injury will improve Outcome: Progressing   Problem: Clinical Measurements: Goal: Ability to maintain clinical measurements within normal limits will improve Outcome: [place], RN 11/27/21 11:48 AM

## 2021-11-27 NOTE — Evaluation (Signed)
Physical Therapy Evaluation Patient Details Name: Carmen Haney MRN: [id number] DOB: 06-28-1930 Today's Date: 11/27/2021  History of Present Illness  Carmen Haney is a 88 y.o. 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 Per ortho note, pt is non-operative and use of CAM walker for comfort. PMH includes: Anxiety, HTN, A-fib, falls, hypothyroidism, breast ca.  Clinical Impression  Pt admitted with above diagnosis.  Pt currently with functional limitations due to the deficits listed below (see PT Problem List). Pt will benefit from skilled PT to increase their independence and safety with mobility to allow discharge to the venue listed below.  Pt reports pain "all over" however mostly on right side and extremities.  Pt unable to tolerate assisted standing and requiring significant assist.  Pt appears very anxious and fearful of falling.  Pt better able to scoot so performed lateral transfer to drop arm recliner.  Pt will need SNF upon d/c as daughters cannot provide current level of care.        Recommendations for follow up therapy are one component of a multi-disciplinary discharge planning process, led by the attending physician.  Recommendations may be updated based on patient status, additional functional criteria and insurance authorization.  Follow Up Recommendations Skilled nursing-short term rehab (<3 hours/day) Can patient physically be transported by private vehicle: No    Assistance Recommended at Discharge Frequent or constant Supervision/Assistance  Patient can return home with the following  A lot of help with walking and/or transfers;A lot of help with bathing/dressing/bathroom;Assistance with cooking/housework;Assist for transportation    Equipment Recommendations None recommended by PT   Recommendations for Other Services       Functional Status Assessment Patient has had a recent decline in their functional status and demonstrates the ability to make significant improvements in function in a reasonable and predictable amount of time.     Precautions / Restrictions Precautions Precautions: Fall Precaution Comments: Multiple falls over last 3-4 weeks per pt/daughter's Required Braces or Orthoses: Other Brace Other Brace: R LE CAM walker for comfort/PRN Restrictions Weight Bearing Restrictions: Yes RUE Weight Bearing: Weight bearing as tolerated RLE Weight Bearing: Weight bearing as tolerated      Mobility  Bed Mobility Overal bed mobility: Needs Assistance Bed Mobility: Supine to Sit     Supine to sit: Min assist, HOB elevated     General bed mobility comments: Min A LE's to advance off EOB, increased time    Transfers Overall transfer level: Needs assistance Equipment used: Rolling walker (2 wheels) Transfers: Sit to/from Stand, Bed to chair/wheelchair/BSC Sit to Stand: Max assist, Total assist, +2 safety/equipment, +2 physical assistance, From elevated surface          Lateral/Scoot Transfers: Min guard, Min assist General transfer comment: Pt anxiety is limiting factor at this time - pt performed sit to stand from EOB x3, during which she would spontaneously let go of RW and lean posteriorly likely secondary to fear of falling. Pt provided with cues and demonstration multiple times however with each attempt continued to let go of RW.  Pt was able to perform lateral scoot to drop arm recliner with Min guard/min assist. Recommend use of lift equipment or lateral scooting for pt/staff safety at this time.    Ambulation/Gait  Stairs            Wheelchair Mobility    Modified Rankin (Stroke Patients Only)       Balance Overall balance assessment: Needs assistance Sitting-balance support: Feet supported Sitting  balance-Leahy Scale: Good     Standing balance support: Bilateral upper extremity supported, No upper extremity supported Standing balance-Leahy Scale: Zero Standing balance comment: unable to stand without significant physical assist                             Pertinent Vitals/Pain Pain Assessment Pain Assessment: Faces Faces Pain Scale: Hurts even more Pain Location: bilateral LE's R>L and UE's Pain Descriptors / Indicators: Guarding, Grimacing, Moaning Pain Intervention(s): Monitored during session, Repositioned, Limited activity within patient's tolerance    Home Living Family/patient expects to be discharged to:: Private residence Living Arrangements: Children Available Help at Discharge: Family;Available PRN/intermittently Type of Home: House Home Access: Stairs to enter Entrance Stairs-Rails: Right;Left Entrance Stairs-Number of Steps: 4   Home Layout: Two level;Able to live on main level with bedroom/bathroom Home Equipment: Rollator (4 wheels);BSC/3in1;Tub bench;Toilet riser;Transport chair;Hospital bed Additional Comments: Per pt daughter, pt bathroom is small and can accommodate rollator but is tight with tub bench in place    Prior Function Prior Level of Function : Needs assist       Physical Assist : Mobility (physical)     Mobility Comments: Pt has been ambulating less and less over last 3-4 weeks. She uses rollator for household mobility.Reports multiple falls over last 3-4 weeks.  Pt working with HHPT ADLs Comments: Per pt and daughter report, pt has DME but decreased mobility and h/o multiple falls has increased need for assistance with ADL's over last 3-4 weeks.     Hand Dominance   Dominant Hand: Right    Extremity/Trunk Assessment   Upper Extremity Assessment Upper Extremity Assessment: Overall WFL for tasks assessed;RUE deficits/detail RUE Deficits / Details: Active ROM is WFL bilateral UE's however, pt with c/o soreness in right  shoulder with end range flexion noted RUE: Shoulder pain with ROM    Lower Extremity Assessment Lower Extremity Assessment: Generalized weakness;RLE deficits/detail RLE Deficits / Details: edematous right ankle and painful DF/PF, applied CAM to trial for comfort, report more painful right LE       Communication   Communication: HOH  Cognition Arousal/Alertness: Awake/alert Behavior During Therapy: Anxious, Impulsive Overall Cognitive Status: Within Functional Limits for tasks assessed                                 General Comments: Pt anxiety appears to be limiting factor at this time. Pt is fearful of movement and was noted to let go of RW in standing x3 and with c/o tightness where gait belt was positioned.        General Comments General comments (skin integrity, edema, etc.): Anxiety and fear of falling limiting transfers and moblity at this time    Exercises     Assessment/Plan    PT Assessment Patient needs continued PT services  PT Problem List Decreased strength;Decreased mobility;Decreased safety awareness;Decreased knowledge of use of DME;Decreased balance;Pain       PT Treatment Interventions Gait training;DME instruction;Therapeutic exercise;Balance training;Functional mobility training;Therapeutic activities;Patient/family education;Wheelchair mobility training    PT Goals (Current goals can be found in the Care Plan section)  Acute Rehab PT Goals PT Goal Formulation: With  patient/family Time For Goal Achievement: 12/11/21 Potential to Achieve Goals: Fair    Frequency Min 2X/week     Co-evaluation   Reason for Co-Treatment: Complexity of the patient's impairments (multi-system involvement);For patient/therapist safety;To address functional/ADL transfers PT goals addressed during session: Mobility/safety with mobility OT goals addressed during session: ADL's and self-care;Other (comment) (Functional transfers in preparation for increased  participation in ADL's)       AM-PAC PT "6 Clicks" Mobility  Outcome Measure Help needed turning from your back to your side while in a flat bed without using bedrails?: A Little Help needed moving from lying on your back to sitting on the side of a flat bed without using bedrails?: A Little Help needed moving to and from a bed to a chair (including a wheelchair)?: A Lot Help needed standing up from a chair using your arms (e.g., wheelchair or bedside chair)?: A Lot Help needed to walk in hospital room?: Total Help needed climbing 3-5 steps with a railing? : Total 6 Click Score: 12    End of Session Equipment Utilized During Treatment: Gait belt Activity Tolerance: Other (comment) (limited by anxiety and fear of falling) Patient left: with call bell/phone within reach;in chair;with chair alarm set;with family/visitor present;with nursing/sitter in room Nurse Communication: Mobility status PT Visit Diagnosis: Unsteadiness on feet (R26.81);History of falling (Z91.81);Other abnormalities of gait and mobility (R26.89)    Time: 3646-8032 PT Time Calculation (min) (ACUTE ONLY): 41 min   Charges:   PT Evaluation $PT Eval Moderate Complexity: 1 Mod PT Treatments $Therapeutic Activity: 8-22 mins       Jannette Spanner PT, DPT Physical Therapist Acute Rehabilitation Services Preferred contact method: Secure Chat Weekend Pager Only: [phone] Office: [place] 11/27/2021, 1:15 PM

## 2021-11-27 NOTE — NC FL2 (Signed)
[place] MEDICAID FL2 LEVEL OF CARE SCREENING TOOL     IDENTIFICATION  Patient Name: Carmen Haney Birthdate: 07-10-1930 Sex: female Admission Date (Current Location): 11/26/2021  [facility] and [place] Number:  Herbalist and Address:  [facility],  [address] [place], [place]      Provider Number: [id number]  Attending Physician Name and Address:  Lavina Hamman, MD  Relative Name and Phone Number:  daughter, Jackquline Bosch [phone]    Current Level of Care: Hospital Recommended Level of Care: [place] Prior Approval Number:    Date Approved/Denied:   PASRR Number: [id number]  Discharge Plan: SNF    Current Diagnoses: Patient Active Problem List   Diagnosis Date Noted   Fracture of right proximal fibula 11/27/2021   T12 compression fracture ([place]) 11/27/2021   Effusion of bursa of left knee 11/27/2021   Fall 42/70/6237   Diastolic dysfunction 62/83/1517   Hemangioma of skin and subcutaneous tissue 02/19/2021   History of malignant neoplasm of skin 02/19/2021   Intertrigo 02/19/2021   Other seborrheic keratosis 02/19/2021   Atrial fibrillation ([place]) 02/16/2021   Atrial fibrillation with RVR ([place]) 02/15/2021   Degeneration of lumbar intervertebral disc 10/02/2020   Low back pain 10/02/2020   Personal history of colonic polyps 08/21/2020   Other specified symptoms and signs involving the circulatory and respiratory systems 08/21/2020   Other abnormalities of gait and mobility 08/21/2020   Anxiety and depression 08/21/2020   Chronic kidney disease (CKD), stage III (moderate) ([place]) 08/21/2020   Age-related osteoporosis without current pathological fracture 08/21/2020   Abnormal digestive tract function 08/21/2020   Chronic pain of right knee 08/15/2019   Vertebral fracture, pathological    Hypothyroidism (acquired)    Personal history of radiation therapy    Breast CA ([place])    Genetic testing 10/22/2016    Sensorineural hearing loss (SNHL), bilateral 07/19/2015   Anxiety disorder 07/19/2015   Sclerosing adenosis of left breast 11/15/2014   Bruit 08/03/2013   Family history of malignant neoplasm of breast 07/28/2013   Neoplasm of right breast, primary tumor staging category Tis: ductal carcinoma in situ (DCIS) 10/20/2012   Obesities, morbid (HCC)    HTN (hypertension)    Hyperlipidemia    CAD (coronary artery disease) 07/13/1998    Orientation RESPIRATION BLADDER Height & Weight     Self, Time, Situation, Place  Normal Continent, External catheter (currently with purewick) Weight: 194 lb (88 kg) Height:  '5\' 3"'$  (160 cm)  BEHAVIORAL SYMPTOMS/MOOD NEUROLOGICAL BOWEL NUTRITION STATUS      Continent    AMBULATORY STATUS COMMUNICATION OF NEEDS Skin   Extensive Assist Verbally Normal                       Personal Care Assistance Level of Assistance  Bathing, Dressing Bathing Assistance: Limited assistance   Dressing Assistance: Limited assistance     Functional Limitations Info             SPECIAL CARE FACTORS FREQUENCY  PT (By licensed PT), OT (By licensed OT)     PT Frequency: 5x/wk OT Frequency: 5x/wk            Contractures Contractures Info: Not present    Additional Factors Info  Code Status, Allergies, Psychotropic Code Status Info: Full Allergies Info: Ace inhibitors; latex Psychotropic Info: see MAR         Current Medications (11/27/2021):  This is the current hospital active medication  list Current Facility-Administered Medications  Medication Dose Route Frequency Provider Last Rate Last Admin   acetaminophen (TYLENOL) tablet 650 mg  650 mg Oral Q6H PRN [name], MD   650 mg at 11/27/21 0321   Or   acetaminophen (TYLENOL) suppository 650 mg  650 mg Rectal Q6H PRN [name], MD       apixaban Arne [place]) tablet 5 mg  5 mg Oral BID [name], MD   5 mg at 11/27/21 1028   bisacodyl (DULCOLAX) EC tablet 5 mg  5 mg Oral Daily PRN  [name], MD       bupivacaine(PF) (MARCAINE) 0.5 % injection 10 mL  10 mL Infiltration Once Marchia Bond, MD       bupivacaine(PF) (MARCAINE) 0.5 % injection 10 mL  10 mL Infiltration Once Marchia Bond, MD       docusate sodium (COLACE) capsule 100 mg  100 mg Oral BID [name], MD   100 mg at 11/27/21 1028   hydrALAZINE (APRESOLINE) injection 5 mg  5 mg Intravenous Q4H PRN [name], MD       levothyroxine (SYNTHROID) tablet 150 mcg  150 mcg Oral Q0600 [name], MD   150 mcg at 11/27/21 5188   LORazepam (ATIVAN) tablet 0.5 mg  0.5 mg Oral Q8H PRN [name], MD       methylPREDNISolone acetate (DEPO-MEDROL) injection 40 mg  40 mg Intra-articular Once Marchia Bond, MD       methylPREDNISolone acetate (DEPO-MEDROL) injection 40 mg  40 mg Intra-articular Once Marchia Bond, MD       metoprolol tartrate (LOPRESSOR) tablet 12.5 mg  12.5 mg Oral BID [name], MD   12.5 mg at 11/27/21 1116   morphine (PF) 2 MG/ML injection 2 mg  2 mg Intravenous Q2H PRN [name], MD       ondansetron [facility] [place] [facility]) tablet 4 mg  4 mg Oral Q6H PRN [name], MD       Or   ondansetron [facility]) injection 4 mg  4 mg Intravenous Q6H PRN [name], MD       oxyCODONE (Oxy IR/ROXICODONE) immediate release tablet 5 mg  5 mg Oral Q4H PRN [name], MD   5 mg at 11/26/21 2055   polyethylene glycol (MIRALAX / GLYCOLAX) packet 17 g  17 g Oral Daily PRN [name], MD       polyvinyl alcohol (LIQUIFILM TEARS) 1.4 % ophthalmic solution 1 drop  1 drop Both Eyes QHS [name], MD       sertraline (ZOLOFT) tablet 200 mg  200 mg Oral QHS [name], MD         Discharge Medications: Please see discharge summary for a list of discharge medications.  Relevant Imaging Results:  Relevant Lab Results:   Additional Information SS# [id number]  Lennart Pall, LCSW

## 2021-11-27 NOTE — Hospital Course (Signed)
PMH of PAF, compression fracture, hypothyroidism, HLD, CAD, breast cancer S/P lumpectomy.  Presented to hospital with fall that led to right proximal fibular fracture.  Orthopedic consulted.  Recommend conservative management.  Continue pain control.  PT OT recommended SNF.

## 2021-11-28 DIAGNOSIS — S82831A Other fracture of upper and lower end of right fibula, initial encounter for closed fracture: Secondary | ICD-10-CM | POA: Diagnosis not present

## 2021-11-28 NOTE — TOC Progression Note (Signed)
Transition of Care [facility]) - Progression Note    Patient Details  Name: Carmen Haney MRN: [id number] Date of Birth: 1930/03/07  Transition of Care [facility]) CM/SW Contact  Lennart Pall, LCSW Phone Number: 11/28/2021, 12:56 PM  Clinical Narrative:     Have reviewed SNF bed offers with pt and daughters and they have accepted bed at [facility] who can admit pt on Monday.  Will begin insurance authorization.  Expected Discharge Plan: [place] Barriers to Discharge: Ship broker, SNF Pending bed offer  Expected Discharge Plan and Services Expected Discharge Plan: [place] In-house Referral: Clinical Social Work   Post Acute Care Choice: [place] Living arrangements for the past 2 months: Single Family Home                 DME Arranged: N/A DME Agency: NA                   Social Determinants of Health (SDOH) Interventions    Readmission Risk Interventions    11/27/2021    1:51 PM  Readmission Risk Prevention Plan  Transportation Screening Complete  PCP or Specialist Appt within 5-7 Days Complete  Home Care Screening Complete  Medication Review (RN CM) Complete

## 2021-11-28 NOTE — Progress Notes (Signed)
[facility] Progress Note Patient: Carmen Haney PPJ:[id number] DOB: July 08, 1930 DOA: 11/26/2021  DOS: the patient was seen and examined on 11/28/2021  Brief hospital course: PMH of PAF, compression fracture, hypothyroidism, HLD, CAD, breast cancer S/P lumpectomy.  Presented to hospital with fall that led to right proximal fibular fracture.  Orthopedic consulted.  Recommend conservative management.  Continue pain control.  PT OT recommended SNF. Assessment and Plan: Acute right proximal fibular fracture. Orthopedic was consulted. Dr. Mardelle Matte recommended conservative measures. Patient does not require any immobilizer by them. Currently in a cam walker boot. PT OT recommends SNF. WBAT as tolerated. Orthopedic patient is not surgical candidate and recommends to maintain physical therapy only so that some level of functioning can be maintained.   Chronic compression fracture T8, T12. Managed medically. Patient does not want to undergo any procedure intervention for this.   Fall. Family reports generalized weakness ongoing for last 3 weeks. No change in medications reported. Patient does not have frequent falls. Her last fall was actually 1 year ago. Patient does not have any focal deficit at the time of my evaluation. Cerebellar function intact. No asterixis. Does not appear to be on any medication that can limit with the frequent fall. Does appear to have some restless leg syndrome. Patient does report incontinent episode for last few days, CT of the head does not show any evidence of hydrocephalus. Does not have any evidence of infection right now. Orthostatic vitals negative.  Carotid Dopplers negative.  ESR normal.  No urinary retention on bladder scan.  Monitor.   Hypothyroidism. TSH adequate. Continue Synthroid.   Paroxysmal A-fib on chronic anticoagulation. Currently appears to be in sinus rhythm. Continue rate controlling medication. Patient does not appear to have  any frequent falls and therefore currently would not discuss regarding stopping the anticoagulation. Family is aware that if the patient continues to have falls in the future she may require to stop anticoagulation.   Chronic rotator cuff tear. Management per orthopedics.   Bilateral knee arthritis Orthopedic performed intra-articular steroid injection on 11/16.   Chronic diastolic CHF. Does not appear to be volume overloaded. For now we will monitor.   Depression. Patient is on Zoloft. 200 mg appears to be rather high dose but although patient appears to be requiring this dose and therefore we will continue it for now.   Pulmonary nodule Scattered sub-5 mm pulmonary nodules.  Repeat CT scan in 6 to 12 months is suggested.   Emphysema Continue albuterol as needed.   Possible restless leg syndrome. Patient reports that she has been noticing jerking movement of her right lower extremity for last few weeks. Might consider restless leg syndrome therapy with [facility]. Recommend outpatient sleep study.  Currently symptoms appear to have resolved.   Subjective: No nausea no vomiting no fever no chills.  Pain still present.  Ankle appears to be more bruised.  Physical Exam: General: in mild distress;  Cardiovascular: S1 and S2 Present, no Murmur Respiratory: normal respiratory effort, Bilateral Air entry present, no Crackles, no wheezes Abdomen: Bowel Sound present, Non tender  Extremities: no edema on left.  Right lower extremity edema seen with discoloration expected for evolution of an injury. Neurology: alert and oriented to time, place, and person no asterixis.  No pronator drift.  Data Reviewed: I have Reviewed nursing notes, Vitals, and Lab results.  Disposition: Status is: Inpatient Remains inpatient appropriate because: Awaiting placement.  Now medically stable.  Place TED hose Start: 11/28/21 1222 apixaban (ELIQUIS) tablet 5  mg Start: 11/26/21 2200 apixaban (ELIQUIS)  tablet 5 mg   Family Communication:   Daughter [name] at bedside. Level of care: Med-Surg  Vitals:   11/27/21 1711 11/27/21 2153 11/28/21 0500 11/28/21 1453  BP: (!) 128/55 (!) 124/53  (!) 127/53  Pulse: 81 80  64  Resp: _0 Temp: 98.9 F (37.2 C) 98.6 F (37 C)  98.7 F (37.1 C)  TempSrc: Oral Oral  Oral  SpO2: 92% 93%  95%  Weight:   90.1 kg   Height:         Author: Berle Mull, MD 11/28/2021 6:21 PM  Please look on [email or web address] to find out who is on call.

## 2021-11-28 NOTE — Plan of Care (Signed)
Problem: Health Behavior/Discharge Planning: Goal: Ability to manage health-related needs will improve Outcome: Progressing   Problem: Clinical Measurements: Goal: Ability to maintain clinical measurements within normal limits will improve Outcome: Progressing   Problem: Clinical Measurements: Goal: Respiratory complications will improve Outcome: Progressing   Problem: Pain Managment: Goal: General experience of comfort will improve Outcome: [place], RN 11/28/21 8:03 AM
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
## 2021-11-29 DIAGNOSIS — S82831A Other fracture of upper and lower end of right fibula, initial encounter for closed fracture: Secondary | ICD-10-CM | POA: Diagnosis not present

## 2021-11-29 MED ORDER — DOCUSATE SODIUM 100 MG PO CAPS
100.0000 mg | ORAL_CAPSULE | Freq: Two times a day (BID) | ORAL | Status: DC
  Administered 2021-11-29 – 2021-12-01 (×4): 100 mg via ORAL
  Filled 2021-11-29 (×4): qty 1

## 2021-11-29 MED ORDER — SENNOSIDES-DOCUSATE SODIUM 8.6-50 MG PO TABS
1.0000 | ORAL_TABLET | Freq: Two times a day (BID) | ORAL | Status: DC
  Administered 2021-11-29: 1 via ORAL
  Filled 2021-11-29: qty 1

## 2021-11-29 MED ORDER — POLYETHYLENE GLYCOL 3350 17 G PO PACK
17.0000 g | PACK | Freq: Every day | ORAL | Status: DC
  Administered 2021-11-30 – 2021-12-01 (×2): 17 g via ORAL
  Filled 2021-11-29 (×3): qty 1

## 2021-11-29 NOTE — Progress Notes (Signed)
[facility] PROGRESS NOTE  Patient: Carmen Haney CVE:[id number]   PCP: Kelton Pillar, MD DOB: 04-01-1930   DOA: 11/26/2021   DOS: 11/29/2021    Subjective: Patient's primary concern is jerking movements intermittently.  She mentions that when someone is telling her something which upsets her she has body jerks.  She is also concerned why she is urinating so much.  No nausea no vomiting.  Unable to sleep last night due to ongoing pain.  Objective:  Vitals:   11/28/21 2031 11/29/21 0500 11/29/21 0639 11/29/21 1334  BP: 126/62  (!) 143/54 (!) 149/63  Pulse: 66  (!) 55 (!) 58  Resp: '18  18 18  '$ Temp: 98.3 F (36.8 C)  97.6 F (36.4 C) 98.3 F (36.8 C)  TempSrc:    Oral  SpO2: 95%  95% 93%  Weight:  87.8 kg    Height:       S1-S2 present. Bowel sound present. Expected evolution of the right lower extremity bruising. Edema actually improving in the right lower extremity.  Assessment and plan: Body jerks. Unclear of the etiology. Recommend patient to ensure her pain is well controlled at night so that work with rest well. As mentioned before outpatient sleep study recommended. Can consider Requip although currently not indicated.  Pain control. Educated patient with regards to pain control and how pain medications work and the importance of keeping the pain under control so that she can work with therapy and rest.  Excessive urination. Currently I do not see that the patient's urine output significant. For now we will monitor.  Medical stability. Remains hemodynamically stable for discharge to rehab whenever bed is available and insurance authorization is available.   Author: Berle Mull, MD [facility] 11/29/2021 4:27 PM   If 7PM-7AM, please contact night-coverage at [email or web address]

## 2021-11-30 DIAGNOSIS — S82831A Other fracture of upper and lower end of right fibula, initial encounter for closed fracture: Secondary | ICD-10-CM | POA: Diagnosis not present

## 2021-11-30 LAB — BASIC METABOLIC PANEL
Anion gap: 7 (ref 5–15)
BUN: 26 mg/dL — ABNORMAL HIGH (ref 8–23)
CO2: 27 mmol/L (ref 22–32)
Calcium: 8.5 mg/dL — ABNORMAL LOW (ref 8.9–10.3)
Chloride: 102 mmol/L (ref 98–111)
Creatinine, Ser: 0.89 mg/dL (ref 0.44–1.00)
GFR, Estimated: >60 mL/min (ref >60)
Glucose, Bld: 102 mg/dL — ABNORMAL HIGH (ref 70–99)
Potassium: 4.8 mmol/L (ref 3.5–5.1)
Sodium: 136 mmol/L (ref 135–145)

## 2021-11-30 LAB — CBC
HCT: 40.3 % (ref 36.0–46.0)
Hemoglobin: 12.8 g/dL (ref 12.0–15.0)
MCH: 30 pg (ref 26.0–34.0)
MCHC: 31.8 g/dL (ref 30.0–36.0)
MCV: 94.6 fL (ref 80.0–100.0)
Platelets: 263 10*3/uL (ref 150–400)
RBC: 4.26 MIL/uL (ref 3.87–5.11)
RDW: 14.3 % (ref 11.5–15.5)
WBC: 7.9 10*3/uL (ref 4.0–10.5)
nRBC: 0 % (ref 0.0–0.2)

## 2021-11-30 MED ORDER — ACETAMINOPHEN 500 MG PO TABS
1000.0000 mg | ORAL_TABLET | Freq: Three times a day (TID) | ORAL | Status: DC
  Administered 2021-11-30 – 2021-12-01 (×4): 1000 mg via ORAL
  Filled 2021-11-30 (×4): qty 2

## 2021-11-30 NOTE — Progress Notes (Signed)
[facility] PROGRESS NOTE  Patient: Carmen Haney HDI:[id number]   PCP: Kelton Pillar, MD DOB: [birth year]   DOA: 11/26/2021   DOS: 11/30/2021    Subjective: No acute complaint.  No nausea no vomiting no fever no chills.  Objective:  Vitals:   11/30/21 0500 11/30/21 0503 11/30/21 0900 11/30/21 1317  BP:  (!) 147/70 132/68 136/67  Pulse:  (!) 55 60 (!) 54  Resp:  16  14  Temp:  98.4 F (36.9 C)  98.2 F (36.8 C)  TempSrc:  Oral  Oral  SpO2:  94%  97%  Weight: 86.8 kg     Height:       S1-S2 present Bowel sound present. Clear to auscultation.  Assessment and plan: Labs checked on 11/19.  Remaining stable.  Pain control. Will initiate scheduled Tylenol.  Medical stability. Remains medically stable for discharge to SNF.  Author: Berle Mull, MD [facility] 11/30/2021 7:46 PM   If 7PM-7AM, please contact night-coverage at [email or web address]

## 2021-11-30 NOTE — Progress Notes (Signed)
Physical Therapy Treatment Patient Details Name: Carmen Haney MRN: [id number] DOB: [birth year] Today's Date: 11/30/2021   History of Present Illness Carmen Haney is a 88 y.o. 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 Per ortho note, pt is non-operative and use of CAM walker for comfort. PMH includes: Anxiety, HTN, A-fib, falls, hypothyroidism, breast ca.    PT Comments    Pt continues anxious but cooperative and up to stand x 2 with STEDY before transferring back to bed.  Pt fatigues easily but performed standing tasks with decreased assist this session.   Recommendations for follow up therapy are one component of a multi-disciplinary discharge planning process, led by the attending physician.  Recommendations may be updated based on patient status, additional functional criteria and insurance authorization.  Follow Up Recommendations  Skilled nursing-short term rehab (<3 hours/day) Can patient physically be transported by private vehicle: No   Assistance Recommended at Discharge Frequent or constant Supervision/Assistance  Patient can return home with the following A lot of help with walking and/or transfers;A lot of help with bathing/dressing/bathroom;Assistance with cooking/housework;Assist for transportation   Equipment Recommendations  None recommended by PT    Recommendations for Other Services       Precautions / Restrictions Precautions Precautions: Fall Precaution Comments: Multiple falls over last 3-4 weeks per pt/daughter's Required Braces or Orthoses: Other Brace Other Brace: R LE CAM walker for comfort/PRN Restrictions Weight Bearing Restrictions: No RUE Weight Bearing: Weight bearing as tolerated RLE Weight Bearing: Weight bearing as tolerated     Mobility  Bed  Mobility Overal bed mobility: Needs Assistance Bed Mobility: Sit to Supine     Supine to sit: Min assist, HOB elevated Sit to supine: Mod assist, +2 for physical assistance, +2 for safety/equipment   General bed mobility comments: assist to manage trunk and bil LEs. Patient Response: Anxious  Transfers Overall transfer level: Needs assistance Equipment used: Rolling walker (2 wheels) Transfers: Sit to/from Stand, Bed to chair/wheelchair/BSC Sit to Stand: +2 physical assistance, Min assist, Mod assist           General transfer comment: Pt stood x 2 with STEDY.  Use of STEDY to transfer bed to chair    Ambulation/Gait               General Gait Details: Pt stood x 2 with STEDY maintaining standing with min assist for ~90 second total   Stairs             Wheelchair Mobility    Modified Rankin (Stroke Patients Only)       Balance Overall balance assessment: Needs assistance Sitting-balance support: Feet supported Sitting balance-Leahy Scale: Good     Standing balance support: Bilateral upper extremity supported, No upper extremity supported Standing balance-Leahy Scale: Zero Standing balance comment: unable to stand without significant physical assist                            Cognition Arousal/Alertness: Awake/alert Behavior During Therapy: Anxious, Impulsive Overall Cognitive Status: Within Functional Limits for tasks assessed                                 General Comments: Pt anxiety appears to be limiting factor at this time. Pt  is fearful of movement and was noted to let go of RW in standing x3 and with c/o tightness where gait belt was positioned.        Exercises      General Comments        Pertinent Vitals/Pain Pain Assessment Pain Assessment: Faces Faces Pain Scale: Hurts little more Pain Location: bilateral LE's R>L and UE's Pain Descriptors / Indicators: Guarding, Grimacing Pain Intervention(s):  Limited activity within patient's tolerance, Monitored during session, Premedicated before session    Home Living                          Prior Function            PT Goals (current goals can now be found in the care plan section) Acute Rehab PT Goals PT Goal Formulation: With patient/family Time For Goal Achievement: 12/11/21 Potential to Achieve Goals: Fair Progress towards PT goals: Progressing toward goals    Frequency    Min 2X/week      PT Plan Current plan remains appropriate    Co-evaluation              AM-PAC PT "6 Clicks" Mobility   Outcome Measure  Help needed turning from your back to your side while in a flat bed without using bedrails?: A Little Help needed moving from lying on your back to sitting on the side of a flat bed without using bedrails?: A Little Help needed moving to and from a bed to a chair (including a wheelchair)?: A Lot Help needed standing up from a chair using your arms (e.g., wheelchair or bedside chair)?: A Lot Help needed to walk in hospital room?: Total Help needed climbing 3-5 steps with a railing? : Total 6 Click Score: 12    End of Session Equipment Utilized During Treatment: Gait belt Activity Tolerance: Patient tolerated treatment well;Patient limited by fatigue Patient left: with call bell/phone within reach;in bed;with bed alarm set Nurse Communication: Mobility status PT Visit Diagnosis: Unsteadiness on feet (R26.81);History of falling (Z91.81);Other abnormalities of gait and mobility (R26.89)     Time: 1355-1416 PT Time Calculation (min) (ACUTE ONLY): 21 min  Charges:  $Therapeutic Activity: 8-22 mins                     Debe Coder PT Acute Rehabilitation Services Pager [phone] Office [phone]    Damarko Stitely 11/30/2021, 2:24 PM

## 2021-11-30 NOTE — Progress Notes (Signed)
Physical Therapy Treatment Patient Details Name: Carmen Haney MRN: [id number] DOB: 11-08-1930 Today's Date: 11/30/2021   History of Present Illness Carmen Haney is a 88 y.o. 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 Per ortho note, pt is non-operative and use of CAM walker for comfort. PMH includes: Anxiety, HTN, A-fib, falls, hypothyroidism, breast ca.    PT Comments    Pt cooperative but anxious and requiring increased time for all activities.  Pt up to sitting EOB with good balance noted, stood x 2 with RW but unable to initiate step, and transferred bed to chair with use of STEDY.  Pt very pleased to be up in chair.  Daughter present for session.   Recommendations for follow up therapy are one component of a multi-disciplinary discharge planning process, led by the attending physician.  Recommendations may be updated based on patient status, additional functional criteria and insurance authorization.  Follow Up Recommendations  Skilled nursing-short term rehab (<3 hours/day) Can patient physically be transported by private vehicle: No   Assistance Recommended at Discharge Frequent or constant Supervision/Assistance  Patient can return home with the following A lot of help with walking and/or transfers;A lot of help with bathing/dressing/bathroom;Assistance with cooking/housework;Assist for transportation   Equipment Recommendations  None recommended by PT    Recommendations for Other Services       Precautions / Restrictions Precautions Precautions: Fall Precaution Comments: Multiple falls over last 3-4 weeks per pt/daughter's Required Braces or Orthoses: Other Brace Other Brace: R LE CAM walker for comfort/PRN Restrictions Weight Bearing Restrictions: No RUE Weight Bearing: Weight  bearing as tolerated RLE Weight Bearing: Weight bearing as tolerated     Mobility  Bed Mobility Overal bed mobility: Needs Assistance Bed Mobility: Supine to Sit     Supine to sit: Min assist, HOB elevated     General bed mobility comments: pt performed modified roll to move to EOB sitting Patient Response: Anxious  Transfers Overall transfer level: Needs assistance Equipment used: Rolling walker (2 wheels) Transfers: Sit to/from Stand, Bed to chair/wheelchair/BSC Sit to Stand: Mod assist, +2 physical assistance, From elevated surface           General transfer comment: Pt stood x 2 with RW but with difficulty achieving and maintaining erect posture.  Use of STEDY to transfer bed to chair    Ambulation/Gait               General Gait Details: Pt stood but unable to step with either LE.  Use of STEDY to transfer to chair   Stairs             Wheelchair Mobility    Modified Rankin (Stroke Patients Only)       Balance Overall balance assessment: Needs assistance Sitting-balance support: Feet supported Sitting balance-Leahy Scale: Good     Standing balance support: Bilateral upper extremity supported, No upper extremity supported Standing balance-Leahy Scale: Zero Standing balance comment: unable to stand without significant physical assist                            Cognition Arousal/Alertness: Awake/alert Behavior During Therapy: Anxious, Impulsive Overall Cognitive Status: Within Functional Limits for tasks assessed  Exercises      General Comments        Pertinent Vitals/Pain Pain Assessment Pain Assessment: Faces Faces Pain Scale: Hurts little more Pain Location: bilateral LE's R>L and UE's Pain Descriptors / Indicators: Guarding, Grimacing Pain Intervention(s): Limited activity within patient's tolerance, Monitored during session (pt declines pain meds prior to  activity)    Home Living                          Prior Function            PT Goals (current goals can now be found in the care plan section) Acute Rehab PT Goals PT Goal Formulation: With patient/family Time For Goal Achievement: 12/11/21 Potential to Achieve Goals: Fair Progress towards PT goals: Progressing toward goals    Frequency    Min 2X/week      PT Plan Current plan remains appropriate    Co-evaluation              AM-PAC PT "6 Clicks" Mobility   Outcome Measure  Help needed turning from your back to your side while in a flat bed without using bedrails?: A Little Help needed moving from lying on your back to sitting on the side of a flat bed without using bedrails?: A Little Help needed moving to and from a bed to a chair (including a wheelchair)?: A Lot Help needed standing up from a chair using your arms (e.g., wheelchair or bedside chair)?: A Lot Help needed to walk in hospital room?: Total Help needed climbing 3-5 steps with a railing? : Total 6 Click Score: 12    End of Session Equipment Utilized During Treatment: Gait belt Activity Tolerance: Patient tolerated treatment well;Patient limited by fatigue Patient left: with call bell/phone within reach;in chair;with chair alarm set;with family/visitor present;with nursing/sitter in room Nurse Communication: Mobility status PT Visit Diagnosis: Unsteadiness on feet (R26.81);History of falling (Z91.81);Other abnormalities of gait and mobility (R26.89)     Time: 9574-7340 PT Time Calculation (min) (ACUTE ONLY): 38 min  Charges:  $Therapeutic Activity: 23-37 mins                     Carmen Haney PT Acute Rehabilitation Services Pager [phone] Office [phone]    Carmen Haney 11/30/2021, 12:57 PM

## 2021-12-01 DIAGNOSIS — R350 Frequency of micturition: Secondary | ICD-10-CM | POA: Diagnosis not present

## 2021-12-01 DIAGNOSIS — M6281 Muscle weakness (generalized): Secondary | ICD-10-CM | POA: Diagnosis not present

## 2021-12-01 DIAGNOSIS — M5136 Other intervertebral disc degeneration, lumbar region: Secondary | ICD-10-CM | POA: Diagnosis not present

## 2021-12-01 DIAGNOSIS — N390 Urinary tract infection, site not specified: Secondary | ICD-10-CM | POA: Diagnosis not present

## 2021-12-01 DIAGNOSIS — F419 Anxiety disorder, unspecified: Secondary | ICD-10-CM | POA: Diagnosis not present

## 2021-12-01 DIAGNOSIS — L821 Other seborrheic keratosis: Secondary | ICD-10-CM | POA: Diagnosis not present

## 2021-12-01 DIAGNOSIS — S82831A Other fracture of upper and lower end of right fibula, initial encounter for closed fracture: Secondary | ICD-10-CM | POA: Diagnosis not present

## 2021-12-01 DIAGNOSIS — Z85828 Personal history of other malignant neoplasm of skin: Secondary | ICD-10-CM | POA: Diagnosis not present

## 2021-12-01 DIAGNOSIS — L853 Xerosis cutis: Secondary | ICD-10-CM | POA: Diagnosis not present

## 2021-12-01 DIAGNOSIS — N182 Chronic kidney disease, stage 2 (mild): Secondary | ICD-10-CM | POA: Diagnosis not present

## 2021-12-01 DIAGNOSIS — L304 Erythema intertrigo: Secondary | ICD-10-CM | POA: Diagnosis not present

## 2021-12-01 DIAGNOSIS — M170 Bilateral primary osteoarthritis of knee: Secondary | ICD-10-CM | POA: Diagnosis not present

## 2021-12-01 DIAGNOSIS — F331 Major depressive disorder, recurrent, moderate: Secondary | ICD-10-CM | POA: Diagnosis not present

## 2021-12-01 DIAGNOSIS — I480 Paroxysmal atrial fibrillation: Secondary | ICD-10-CM | POA: Diagnosis not present

## 2021-12-01 DIAGNOSIS — S82811D Torus fracture of upper end of right fibula, subsequent encounter for fracture with routine healing: Secondary | ICD-10-CM | POA: Diagnosis not present

## 2021-12-01 DIAGNOSIS — K5900 Constipation, unspecified: Secondary | ICD-10-CM | POA: Diagnosis not present

## 2021-12-01 DIAGNOSIS — I10 Essential (primary) hypertension: Secondary | ICD-10-CM | POA: Diagnosis not present

## 2021-12-01 DIAGNOSIS — M25462 Effusion, left knee: Secondary | ICD-10-CM | POA: Diagnosis not present

## 2021-12-01 DIAGNOSIS — I4891 Unspecified atrial fibrillation: Secondary | ICD-10-CM | POA: Diagnosis not present

## 2021-12-01 DIAGNOSIS — S82831S Other fracture of upper and lower end of right fibula, sequela: Secondary | ICD-10-CM | POA: Diagnosis not present

## 2021-12-01 DIAGNOSIS — D649 Anemia, unspecified: Secondary | ICD-10-CM | POA: Diagnosis not present

## 2021-12-01 DIAGNOSIS — E039 Hypothyroidism, unspecified: Secondary | ICD-10-CM | POA: Diagnosis not present

## 2021-12-01 DIAGNOSIS — R2689 Other abnormalities of gait and mobility: Secondary | ICD-10-CM | POA: Diagnosis not present

## 2021-12-01 DIAGNOSIS — I5189 Other ill-defined heart diseases: Secondary | ICD-10-CM | POA: Diagnosis not present

## 2021-12-01 DIAGNOSIS — R278 Other lack of coordination: Secondary | ICD-10-CM | POA: Diagnosis not present

## 2021-12-01 DIAGNOSIS — E871 Hypo-osmolality and hyponatremia: Secondary | ICD-10-CM | POA: Diagnosis not present

## 2021-12-01 DIAGNOSIS — Z9181 History of falling: Secondary | ICD-10-CM | POA: Diagnosis not present

## 2021-12-01 DIAGNOSIS — F411 Generalized anxiety disorder: Secondary | ICD-10-CM | POA: Diagnosis not present

## 2021-12-01 DIAGNOSIS — D1801 Hemangioma of skin and subcutaneous tissue: Secondary | ICD-10-CM | POA: Diagnosis not present

## 2021-12-01 DIAGNOSIS — B379 Candidiasis, unspecified: Secondary | ICD-10-CM | POA: Diagnosis not present

## 2021-12-01 MED ORDER — FUROSEMIDE 20 MG PO TABS: 20.0000 mg | ORAL_TABLET | Freq: Every day | ORAL | 3 refills | Status: DC | PRN

## 2021-12-01 MED ORDER — ENSURE ENLIVE PO LIQD: 237.0000 mL | ORAL | 12 refills | Status: DC

## 2021-12-01 MED ORDER — TRAMADOL HCL 50 MG PO TABS: 50.0000 mg | ORAL_TABLET | Freq: Four times a day (QID) | ORAL | 0 refills | Status: DC | PRN

## 2021-12-01 MED ORDER — DOCUSATE SODIUM 100 MG PO CAPS: 100.0000 mg | ORAL_CAPSULE | Freq: Two times a day (BID) | ORAL | 0 refills | Status: DC

## 2021-12-01 MED ORDER — BISACODYL 10 MG RE SUPP: 10.0000 mg | Freq: Once | RECTAL | Status: DC

## 2021-12-01 NOTE — Discharge Summary (Addendum)
Physician Discharge Summary   Patient: Carmen Haney MRN: [id number] DOB: Aug 31, 1930  Admit date:     11/26/2021  Discharge date: 12/01/21  Discharge Physician: Berle Mull  PCP: Kelton Pillar, MD  Recommendations at discharge: Follow-up with PCP in 1 week. Follow-up with orthopedic surgery in 2 weeks. Scattered sub-5 mm pulmonary nodules.  Repeat CT scan in 6 to 12 months is suggested.   Contact information for follow-up providers     Marchia Bond, MD. Schedule an appointment as soon as possible for a visit in 2 week(s).   Specialty: Orthopedic Surgery Contact information: [address] [place] [address] [phone]         Kelton Pillar, MD. Schedule an appointment as soon as possible for a visit in 1 week(s).   Specialty: Family Medicine Contact information: [address] [place] [address] [phone]              Contact information for after-discharge care     Destination     [facility] .   Service: Skilled Nursing Contact information: [address] [place] [phone]                    Discharge Diagnoses: Principal Problem:   Fracture of right proximal fibula Active Problems:   HTN (hypertension)   Hypothyroidism (acquired)   Anxiety and depression   Atrial fibrillation ([place])   Fall   T12 compression fracture (HCC)   Effusion of bursa of left knee  Hospital Course: PMH of PAF, compression fracture, hypothyroidism, HLD, CAD, breast cancer S/P lumpectomy.  Presented to hospital with fall that led to right proximal fibular fracture.  Orthopedic consulted.  Recommend conservative management.  Continue pain control.  PT OT recommended SNF. Assessment and Plan  Acute right proximal fibular fracture. Orthopedic was consulted. Dr. Mardelle Matte recommended conservative measures. Patient does not require any immobilizer by them. Currently in a cam  walker boot. PT OT recommends SNF. WBAT as tolerated. Per orthopedic, patient is not surgical candidate and recommends to maintain physical therapy only so that some level of functioning can be maintained.   Chronic compression fracture T8, T12. Managed medically. Patient does not want to undergo any procedure intervention for this.   Fall. Family reports generalized weakness ongoing for last 3 weeks. No change in medications reported. Patient does not have frequent falls. Her last fall was actually 1 year ago. Patient does not have any focal deficit at the time of my evaluation. Cerebellar function intact. No asterixis. Does not appear to be on any medication that can limit with the frequent fall. Does appear to have some restless leg syndrome. Patient does report incontinent episode for last few days, CT of the head does not show any evidence of hydrocephalus. Does not have any evidence of infection right now. Orthostatic vitals negative.  Carotid Dopplers negative.  ESR normal.  No urinary retention on bladder scan.  Monitor.   Hypothyroidism. TSH adequate. Continue Synthroid.   Paroxysmal A-fib on chronic anticoagulation. Currently appears to be in sinus rhythm. Continue rate controlling medication. Patient does not appear to have any frequent falls and therefore currently would not discuss regarding stopping the anticoagulation. Family is aware that if the patient continues to have falls in the future she may require to stop anticoagulation.   Chronic rotator cuff tear. Management per orthopedics.  PT OT recommended for now.  Outpatient follow-up.   Bilateral knee arthritis  Orthopedic performed intra-articular steroid injection on 11/16.   Chronic diastolic CHF. Does not appear to be volume overloaded. For now we will monitor.  Changing Lasix to as needed.   Depression. Patient is on Zoloft. 200 mg appears to be rather high dose but although patient appears to be  requiring this dose and therefore we will continue it for now.   Pulmonary nodule Scattered sub-5 mm pulmonary nodules.  Repeat CT scan in 6 to 12 months is suggested.   Emphysema Continue albuterol as needed.   Possible restless leg syndrome. Patient reports that she has been noticing jerking movement of her right lower extremity for last few weeks. Might consider restless leg syndrome therapy with [facility] outpatient if persistent. Recommend outpatient sleep study.  Currently symptoms appear to have resolved.   Obesity Body mass index is 33.66 kg/m.  Placing the pt at higher risk of poor outcomes.  Pain control - [place] Controlled Substance Reporting System database was reviewed. and patient was instructed, not to drive, operate heavy machinery, perform activities at heights, swimming or participation in water activities or provide baby-sitting services while on Pain, Sleep and Anxiety Medications; until their outpatient Physician has advised to do so again. Also recommended to not to take more than prescribed Pain, Sleep and Anxiety Medications.  Consultants:  Orthopedic surgery Dr. Landau  Procedures performed:  Bilateral knee intra-articular injection of Depo-Medrol and Marcaine  DISCHARGE MEDICATION: Allergies as of 12/01/2021       Reactions   Ace Inhibitors Cough   Latex Rash        Medication List     STOP taking these medications    LORazepam 0.5 MG tablet Commonly known as: ATIVAN   potassium chloride 10 MEQ tablet Commonly known as: KLOR-CON       TAKE these medications    acetaminophen 500 MG tablet Commonly known as: TYLENOL Take 1,000 mg by mouth every 8 (eight) hours as needed for mild pain or headache.   apixaban 5 MG Tabs tablet Commonly known as: ELIQUIS Take 1 tablet (5 mg total) by mouth 2 (two) times daily.   CALCIUM/D3 ADULT GUMMIES PO Take 2 tablets by mouth in the morning.   docusate sodium 100 MG capsule Commonly known as:  COLACE Take 1 capsule (100 mg total) by mouth 2 (two) times daily.   feeding supplement Liqd Take 237 mLs by mouth daily.   furosemide 20 MG tablet Commonly known as: LASIX Take 1 tablet (20 mg total) by mouth daily as needed (For weight gain of 3 pounds in 1 day or 5 pounds in 1 week). What changed:  when to take this reasons to take this   levothyroxine 150 MCG tablet Commonly known as: SYNTHROID Take 150 mcg by mouth daily before breakfast.   MAGNESIUM PO Take 20 mLs by mouth at bedtime.   metoprolol tartrate 25 MG tablet Commonly known as: LOPRESSOR Take 0.5 tablets (12.5 mg total) by mouth 2 (two) times daily.   polyethylene glycol powder 17 GM/SCOOP powder Commonly known as: GLYCOLAX/MIRALAX Take 17 g by mouth daily as needed for mild constipation.   PROBIOTIC GUMMIES PO Take 2 tablets by mouth in the morning.   sertraline 100 MG tablet Commonly known as: ZOLOFT Take 200 mg by mouth at bedtime.   SYSTANE OP Place 2-3 drops into both eyes at bedtime.   traMADol 50 MG tablet Commonly known as: Ultram Take 1 tablet (50 mg total) by mouth every 6 (six) hours as needed   for moderate pain.       Disposition: SNF Diet recommendation: Regular diet  Discharge Exam: Vitals:   11/30/21 0900 11/30/21 1317 11/30/21 2054 12/01/21 0523  BP: 132/68 136/67 (!) 140/74 (!) 145/66  Pulse: 60 (!) 54 64 (!) 52  Resp:  _0 Temp:  98.2 F (36.8 C) 98.4 F (36.9 C) (!) 97.5 F (36.4 C)  TempSrc:  Oral Oral Oral  SpO2:  97% 100% 96%  Weight:   86.2 kg   Height:       General: Appear in no distress; no visible Abnormal Neck Mass Or lumps, Conjunctiva normal Cardiovascular: S1 and S2 Present, no Murmur, Respiratory: good respiratory effort, Bilateral Air entry present and CTA, no Crackles, no wheezes Abdomen: Bowel Sound present, Non tender  Extremities: Trace right lower extremity edema with bruising Neurology: alert and oriented to time, place, and person  Filed  Weights   11/29/21 0500 11/30/21 0500 11/30/21 2054  Weight: 87.8 kg 86.8 kg 86.2 kg   Condition at discharge: stable  The results of significant diagnostics from this hospitalization (including imaging, microbiology, ancillary and laboratory) are listed below for reference.   Imaging Studies: VAS US CAROTID  Result Date: 11/28/2021 Carotid Arterial Duplex Study Patient Name:  CEDRA VILLALON  Date of Exam:   11/27/2021 Medical Rec #: [id number]       Accession #:    [id number] Date of Birth: 10/25/1930       Patient Gender: F Patient Age:   14 years Exam Location:  [facility] Procedure:      VAS US CAROTID Referring Phys: Ashleen Demma --------------------------------------------------------------------------------  Indications:       Syncope. Risk Factors:      Hypertension, hyperlipidemia. Comparison Study:  No prior studies. Performing Technologist: Oliver Hum RVT  Examination Guidelines: A complete evaluation includes B-mode imaging, spectral Doppler, color Doppler, and power Doppler as needed of all accessible portions of each vessel. Bilateral testing is considered an integral part of a complete examination. Limited examinations for reoccurring indications may be performed as noted.  Right Carotid Findings: +----------+--------+--------+--------+------------------+------------------+           PSV cm/sEDV cm/sStenosisPlaque DescriptionComments           +----------+--------+--------+--------+------------------+------------------+ CCA Prox  81      16                                intimal thickening +----------+--------+--------+--------+------------------+------------------+ CCA Distal76      10                                intimal thickening +----------+--------+--------+--------+------------------+------------------+ ICA Prox  66      16                                intimal thickening  +----------+--------+--------+--------+------------------+------------------+ ICA Distal72      14                                                   +----------+--------+--------+--------+------------------+------------------+ ECA       88      8                                                    +----------+--------+--------+--------+------------------+------------------+ +----------+--------+-------+--------+-------------------+  PSV cm/sEDV cmsDescribeArm Pressure (mmHG) +----------+--------+-------+--------+-------------------+ Subclavian99                                         +----------+--------+-------+--------+-------------------+ +---------+--------+--+--------+-+---------+ VertebralPSV cm/s35EDV cm/s6Antegrade +---------+--------+--+--------+-+---------+  Left Carotid Findings: +----------+--------+-------+--------+----------------------+------------------+           PSV cm/sEDV    StenosisPlaque Description    Comments                             cm/s                                                    +----------+--------+-------+--------+----------------------+------------------+ CCA Prox  69      10             smooth and                                                                heterogenous                             +----------+--------+-------+--------+----------------------+------------------+ CCA Distal70      14             smooth and                                                                heterogenous                             +----------+--------+-------+--------+----------------------+------------------+ ICA Prox  43      11                                   intimal thickening +----------+--------+-------+--------+----------------------+------------------+ ICA Distal100     23                                   tortuous            +----------+--------+-------+--------+----------------------+------------------+ ECA       75      7                                                       +----------+--------+-------+--------+----------------------+------------------+ +----------+--------+--------+--------+-------------------+           PSV cm/sEDV cm/sDescribeArm Pressure (mmHG) +----------+--------+--------+--------+-------------------+ Subclavian150                                         +----------+--------+--------+--------+-------------------+ +---------+--------+--+--------+--+---------+   VertebralPSV cm/s49EDV cm/s15Antegrade +---------+--------+--+--------+--+---------+   Summary: Right Carotid: The extracranial vessels were near-normal with only minimal wall                thickening or plaque. Left Carotid: The extracranial vessels were near-normal with only minimal wall               thickening or plaque. Vertebrals: Bilateral vertebral arteries demonstrate antegrade flow. *See table(s) above for measurements and observations.  Electronically signed by Christopher Dickson MD on 11/28/2021 at 3:00:50 PM.    Final    DG Shoulder Right  Result Date: 11/26/2021 CLINICAL DATA:  [id number]. Fall injury at home, initial encounter with right shoulder, right humerus, left knee and right ankle pain. EXAM: RIGHT SHOULDER - 2+ VIEW; LEFT KNEE - 3 VIEW; RIGHT HUMERUS - 2+ VIEW; RIGHT ANKLE - 2 VIEW COMPARISON:  No comparison studies for the above body parts. FINDINGS: Right shoulder, AP external rotation view and transscapular Y-view only: There is osteopenia without evidence of fractures or dislocation. There are mild circumferential osteophytes at the AC joint, trace spurring of the inferior glenohumeral joint, and mild spurring and subcortical cystic change along the humeral cuff insertion. The right upper lung field is clear. The visualized right ribs show no displaced fractures. No pneumothorax. Right humerus, AP Lat  views: There is osteopenia without evidence of fractures or dislocation. No other focal bone abnormality is seen. Soft tissues are unremarkable. Left knee, routine four views: There is moderate-sized suprapatellar bursal effusion. There is osteopenia without evidence of fractures. There is moderate tricompartmental joint space loss and marginal osteophytosis. No osteochondral loose body is seen. Superficial soft tissues are unremarkable. Right ankle, AP Lat only: There is edema in the distal foreleg and moderate to severe soft tissue swelling at the ankle continuing over the hindfoot and midfoot. Osteopenia. There is bony debris inferior to the medial malleolus which could represent chip fractures versus dystrophic ligamentous or tendon calcifications. There is no further evidence of fractures. The joint space is maintained. There is mild-to-moderate midfoot arthrosis. Interosseous alignment is normal. IMPRESSION: 1. Osteopenia without evidence of fractures of the right shoulder, right humerus and left knee. 2. Moderate-sized left knee suprapatellar bursal effusion. 3. Bony debris inferior to the medial malleolus which could represent chip fractures versus dystrophic ligamentous or tendon calcifications. 4. Moderate to severe soft tissue swelling at the ankle continuing into the foot. 5. Mild degenerative changes of the right shoulder and right humerus. 6. 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 Electronically Signed   By: Keith  Chesser M.D.   On: 11/26/2021 20:09   DG Humerus Right  Result Date: 11/26/2021 CLINICAL DATA:  [id number]. Fall injury at home, initial encounter with right shoulder, right humerus, left knee and right ankle pain. EXAM: RIGHT SHOULDER - 2+ VIEW; LEFT KNEE - 3 VIEW; RIGHT HUMERUS - 2+ VIEW; RIGHT ANKLE - 2 VIEW COMPARISON:  No comparison studies for the above body parts. FINDINGS: Right shoulder, AP external rotation view and transscapular Y-view only: There is osteopenia without evidence of  fractures or dislocation. There are mild circumferential osteophytes at the AC joint, trace spurring of the inferior glenohumeral joint, and mild spurring and subcortical cystic change along the humeral cuff insertion. The right upper lung field is clear. The visualized right ribs show no displaced fractures. No pneumothorax. Right humerus, AP Lat views: There is osteopenia without evidence of fractures or dislocation. No other focal bone abnormality is seen. Soft tissues are unremarkable. Left knee, routine four views:   There is moderate-sized suprapatellar bursal effusion. There is osteopenia without evidence of fractures. There is moderate tricompartmental joint space loss and marginal osteophytosis. No osteochondral loose body is seen. Superficial soft tissues are unremarkable. Right ankle, AP Lat only: There is edema in the distal foreleg and moderate to severe soft tissue swelling at the ankle continuing over the hindfoot and midfoot. Osteopenia. There is bony debris inferior to the medial malleolus which could represent chip fractures versus dystrophic ligamentous or tendon calcifications. There is no further evidence of fractures. The joint space is maintained. There is mild-to-moderate midfoot arthrosis. Interosseous alignment is normal. IMPRESSION: 1. Osteopenia without evidence of fractures of the right shoulder, right humerus and left knee. 2. Moderate-sized left knee suprapatellar bursal effusion. 3. Bony debris inferior to the medial malleolus which could represent chip fractures versus dystrophic ligamentous or tendon calcifications. 4. Moderate to severe soft tissue swelling at the ankle continuing into the foot. 5. Mild degenerative changes of the right shoulder and right humerus. 6. 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 Electronically Signed   By: Keith  Chesser M.D.   On: 11/26/2021 20:09   DG Knee 3 Views Left  Result Date: 11/26/2021 CLINICAL DATA:  [id number]. Fall injury at home, initial  encounter with right shoulder, right humerus, left knee and right ankle pain. EXAM: RIGHT SHOULDER - 2+ VIEW; LEFT KNEE - 3 VIEW; RIGHT HUMERUS - 2+ VIEW; RIGHT ANKLE - 2 VIEW COMPARISON:  No comparison studies for the above body parts. FINDINGS: Right shoulder, AP external rotation view and transscapular Y-view only: There is osteopenia without evidence of fractures or dislocation. There are mild circumferential osteophytes at the AC joint, trace spurring of the inferior glenohumeral joint, and mild spurring and subcortical cystic change along the humeral cuff insertion. The right upper lung field is clear. The visualized right ribs show no displaced fractures. No pneumothorax. Right humerus, AP Lat views: There is osteopenia without evidence of fractures or dislocation. No other focal bone abnormality is seen. Soft tissues are unremarkable. Left knee, routine four views: There is moderate-sized suprapatellar bursal effusion. There is osteopenia without evidence of fractures. There is moderate tricompartmental joint space loss and marginal osteophytosis. No osteochondral loose body is seen. Superficial soft tissues are unremarkable. Right ankle, AP Lat only: There is edema in the distal foreleg and moderate to severe soft tissue swelling at the ankle continuing over the hindfoot and midfoot. Osteopenia. There is bony debris inferior to the medial malleolus which could represent chip fractures versus dystrophic ligamentous or tendon calcifications. There is no further evidence of fractures. The joint space is maintained. There is mild-to-moderate midfoot arthrosis. Interosseous alignment is normal. IMPRESSION: 1. Osteopenia without evidence of fractures of the right shoulder, right humerus and left knee. 2. Moderate-sized left knee suprapatellar bursal effusion. 3. Bony debris inferior to the medial malleolus which could represent chip fractures versus dystrophic ligamentous or tendon calcifications. 4. Moderate to  severe soft tissue swelling at the ankle continuing into the foot. 5. Mild degenerative changes of the right shoulder and right humerus. 6. 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 Electronically Signed   By: Keith  Chesser M.D.   On: 11/26/2021 20:09   DG Ankle 2 Views Right  Result Date: 11/26/2021 CLINICAL DATA:  [id number]. Fall injury at home, initial encounter with right shoulder, right humerus, left knee and right ankle pain. EXAM: RIGHT SHOULDER - 2+ VIEW; LEFT KNEE - 3 VIEW; RIGHT HUMERUS - 2+ VIEW; RIGHT ANKLE - 2 VIEW COMPARISON:  No comparison studies   for the above body parts. FINDINGS: Right shoulder, AP external rotation view and transscapular Y-view only: There is osteopenia without evidence of fractures or dislocation. There are mild circumferential osteophytes at the [facility] joint, trace spurring of the inferior glenohumeral joint, and mild spurring and subcortical cystic change along the humeral cuff insertion. The right upper lung field is clear. The visualized right ribs show no displaced fractures. No pneumothorax. Right humerus, AP Lat views: There is osteopenia without evidence of fractures or dislocation. No other focal bone abnormality is seen. Soft tissues are unremarkable. Left knee, routine four views: There is moderate-sized suprapatellar bursal effusion. There is osteopenia without evidence of fractures. There is moderate tricompartmental joint space loss and marginal osteophytosis. No osteochondral loose body is seen. Superficial soft tissues are unremarkable. Right ankle, AP Lat only: There is edema in the distal foreleg and moderate to severe soft tissue swelling at the ankle continuing over the hindfoot and midfoot. Osteopenia. There is bony debris inferior to the medial malleolus which could represent chip fractures versus dystrophic ligamentous or tendon calcifications. There is no further evidence of fractures. The joint space is maintained. There is mild-to-moderate midfoot arthrosis.  Interosseous alignment is normal. IMPRESSION: 1. Osteopenia without evidence of fractures of the right shoulder, right humerus and left knee. 2. Moderate-sized left knee suprapatellar bursal effusion. 3. Bony debris inferior to the medial malleolus which could represent chip fractures versus dystrophic ligamentous or tendon calcifications. 4. Moderate to severe soft tissue swelling at the ankle continuing into the foot. 5. Mild degenerative changes of the right shoulder and right humerus. 6. 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 Electronically Signed   By: Telford Nab M.D.   On: 11/26/2021 20:09   CT Chest Wo Contrast  Result Date: 11/26/2021 CLINICAL DATA:  Chest trauma.  Fell last evening. EXAM: CT CHEST WITHOUT CONTRAST TECHNIQUE: Multidetector CT imaging of the chest was performed following the standard protocol without IV contrast. RADIATION DOSE REDUCTION: This exam was performed according to the departmental dose-optimization program which includes automated exposure control, adjustment of the mA and/or kV according to patient size and/or use of iterative reconstruction technique. COMPARISON:  None Available. FINDINGS: Cardiovascular: The heart is normal in size. No pericardial effusion. The aorta is normal in caliber. Minimal scattered atherosclerotic calcifications for age. Scattered three-vessel coronary artery calcifications are noted. Mediastinum/Nodes: Small scattered mediastinal and hilar lymph nodes but no mass or overt adenopathy. The esophagus is grossly normal. Lungs/Pleura: Underlying emphysematous changes and pulmonary scarring. Mosaic pattern of ground-glass attenuation typically seen with small airways disease such as asthma or bronchiolitis. No focal pulmonary infiltrates or pleural effusions. Bibasilar scarring changes. Scattered sub 5 mm pulmonary nodules are likely benign. Subpleural atelectasis or scarring noted adjacent to large right-sided thoracic spine spurs. The central  tracheobronchial tree is unremarkable. Upper Abdomen: No significant upper abdominal findings. Musculoskeletal: Severe compression deformity of T8 is a stable finding when compared to prior chest x-ray from February 2023. T12 fracture appears acute. No sternal or rib fractures. IMPRESSION: 1. T12 fracture appears acute. 2. Stable remote severe compression deformity of T8. 3. Underlying emphysematous changes and pulmonary scarring. 4. Mosaic pattern of ground-glass attenuation typically seen with small airways disease such as asthma or bronchiolitis. 5. Small scattered sub 5 mm pulmonary nodules, likely benign. Follow-up noncontrast chest CT in 6-12 months is suggested. Emphysema (ICD10-J43.9). Electronically Signed   By: Marijo Sanes M.D.   On: 11/26/2021 14:58   CT Lumbar Spine Wo Contrast  Result Date: 11/26/2021 CLINICAL  DATA:  Back pain.  Fell last evening. EXAM: CT LUMBAR SPINE WITHOUT CONTRAST TECHNIQUE: Multidetector CT imaging of the lumbar spine was performed without intravenous contrast administration. Multiplanar CT image reconstructions were also generated. RADIATION DOSE REDUCTION: This exam was performed according to the departmental dose-optimization program which includes automated exposure control, adjustment of the mA and/or kV according to patient size and/or use of iterative reconstruction technique. COMPARISON:  Lumbar spine radiographs 08/30/2020 FINDINGS: Segmentation: There are five lumbar type vertebral bodies. The last full intervertebral disc space is labeled L5-S1. Alignment: Normal alignment of the lumbar vertebral bodies. The facets are also normally aligned. Vertebrae: Diffuse and fairly marked osteoporosis. Mild superior endplate depression of L3 is stable since the prior radiographs. No acute lumbar spine compression fracture. There is a compression deformity of T12. I do not see this for certain on the prior lumbar spine plain films. This is likely an acute compression  fracture. No retropulsion or canal compromise. Paraspinal and other soft tissues: No significant paraspinal findings. I do not see an obvious paraspinal hematoma at T12. Disc levels: T12-L1: No significant findings. L1-2: Diffuse annular bulge and mild facet disease contributing to mild bilateral lateral recess stenosis. L2-3: Moderate facet disease and bulging annulus with mild spinal and bilateral lateral recess stenosis. L3-4: Diffuse bulging annulus, facet disease and ligamentum flavum thickening contributing to mild to moderate spinal and bilateral lateral recess stenosis. L4-5: Diffuse bulging degenerated annulus, facet disease and ligamentum flavum thickening contributing to moderately severe spinal and bilateral lateral recess stenosis. No significant foraminal stenosis. L5-S1: Bulging degenerated annulus and severe facet disease with mild spinal and bilateral lateral recess stenosis. No foraminal stenosis. IMPRESSION: 1. Acute compression fracture of T12 without retropulsion or canal compromise. 2. Diffuse and fairly marked osteoporosis. 3. Multilevel spinal and lateral recess stenosis as discussed above. Electronically Signed   By: P.  Gallerani M.D.   On: 11/26/2021 14:49   CT HEAD WO CONTRAST (5MM)  Result Date: 11/26/2021 CLINICAL DATA:  Head trauma, minor (Age >= 65y); Polytrauma, blunt EXAM: CT HEAD WITHOUT CONTRAST CT CERVICAL SPINE WITHOUT CONTRAST TECHNIQUE: Multidetector CT imaging of the head and cervical spine was performed following the standard protocol without intravenous contrast. Multiplanar CT image reconstructions of the cervical spine were also generated. RADIATION DOSE REDUCTION: This exam was performed according to the departmental dose-optimization program which includes automated exposure control, adjustment of the mA and/or kV according to patient size and/or use of iterative reconstruction technique. COMPARISON:  None Available. FINDINGS: CT HEAD FINDINGS Brain: No evidence of  acute infarction, hemorrhage, hydrocephalus, extra-axial collection or mass lesion/mass effect. Vascular: Calcific atherosclerosis. Skull: No acute fracture. Sinuses/Orbits: Clear sinuses.  No acute orbital findings. Other: No mastoid effusions. CT CERVICAL SPINE FINDINGS Alignment: No substantial sagittal subluxation. Skull base and vertebrae: No evidence of acute fracture. Soft tissues and spinal canal: No prevertebral fluid or swelling. No visible canal hematoma. Disc levels:  Moderate multilevel degenerative change. Upper chest: Visualized lung apices are clear. IMPRESSION: 1. No evidence of acute intracranial abnormality. 2. No evidence of acute fracture or traumatic malalignment in the cervical spine. Electronically Signed   By: Frederick S Jones M.D.   On: 11/26/2021 14:43   CT CERVICAL SPINE WO CONTRAST  Result Date: 11/26/2021 CLINICAL DATA:  Head trauma, minor (Age >= 65y); Polytrauma, blunt EXAM: CT HEAD WITHOUT CONTRAST CT CERVICAL SPINE WITHOUT CONTRAST TECHNIQUE: Multidetector CT imaging of the head and cervical spine was performed following the standard protocol without intravenous contrast. Multiplanar CT image   reconstructions of the cervical spine were also generated. RADIATION DOSE REDUCTION: This exam was performed according to the departmental dose-optimization program which includes automated exposure control, adjustment of the mA and/or kV according to patient size and/or use of iterative reconstruction technique. COMPARISON:  None Available. FINDINGS: CT HEAD FINDINGS Brain: No evidence of acute infarction, hemorrhage, hydrocephalus, extra-axial collection or mass lesion/mass effect. Vascular: Calcific atherosclerosis. Skull: No acute fracture. Sinuses/Orbits: Clear sinuses.  No acute orbital findings. Other: No mastoid effusions. CT CERVICAL SPINE FINDINGS Alignment: No substantial sagittal subluxation. Skull base and vertebrae: No evidence of acute fracture. Soft tissues and spinal  canal: No prevertebral fluid or swelling. No visible canal hematoma. Disc levels:  Moderate multilevel degenerative change. Upper chest: Visualized lung apices are clear. IMPRESSION: 1. No evidence of acute intracranial abnormality. 2. No evidence of acute fracture or traumatic malalignment in the cervical spine. Electronically Signed   By: Margaretha Sheffield M.D.   On: 11/26/2021 14:43   DG Knee Complete 4 Views Right  Result Date: 11/26/2021 CLINICAL DATA:  Right knee pain after fall. EXAM: RIGHT KNEE - COMPLETE 4+ VIEW COMPARISON:  None Available. FINDINGS: Minimally displaced proximal right fibular fracture is noted. Moderate degenerative changes are seen involving the medial, lateral and patellofemoral spaces of the right knee. IMPRESSION: Minimally displaced proximal right fibular fracture. Electronically Signed   By: Marijo Conception M.D.   On: 11/26/2021 13:57   DG Pelvis Portable  Result Date: 11/26/2021 CLINICAL DATA:  Fall. EXAM: PORTABLE PELVIS 1-2 VIEWS COMPARISON:  None Available. FINDINGS: There is no evidence of pelvic fracture or diastasis. No pelvic bone lesions are seen. IMPRESSION: Negative. Electronically Signed   By: Marijo Conception M.D.   On: 11/26/2021 13:55   LE ART SEG MULTI (Segm & LE Reynauds)  Result Date: 11/19/2021  LOWER EXTREMITY DOPPLER STUDY Patient Name:  SEEMA BLUM  Date of Exam:   11/19/2021 Medical Rec #: [id number]       Accession #:    [id number] Date of Birth: [birth year]       Patient Gender: F Patient Age:   9 years Exam Location:  [facility] Procedure:      VAS [place] LOWER EXT ART SEG MULTI (SEGMENTALS & LE RAYNAUDS) Referring Phys: ANGELA DUKE --------------------------------------------------------------------------------  Indications: Peripheral artery disease. High Risk Factors: Hypertension, hyperlipidemia, past history of smoking,                    coronary artery disease. Other Factors: Patient complains of bilateral leg pain that is not brought on by                 ambulation. C/O weakness, swelling and burning.  Performing Technologist: Wilkie Aye RVT  Examination Guidelines: A complete evaluation includes at minimum, Doppler waveform signals and systolic blood pressure reading at the level of bilateral brachial, anterior tibial, and posterior tibial arteries, when vessel segments are accessible. Bilateral testing is considered an integral part of a complete examination. Photoelectric Plethysmograph (PPG) waveforms and toe systolic pressure readings are included as required and additional duplex testing as needed. Limited examinations for reoccurring indications may be performed as noted.  ABI Findings: +---------+------------------+-----+--------+--------+ Right    Rt Pressure (mmHg)IndexWaveformComment  +---------+------------------+-----+--------+--------+ Brachial 150                                     +---------+------------------+-----+--------+--------+ CFA  biphasic         +---------+------------------+-----+--------+--------+ Popliteal                       biphasic         +---------+------------------+-----+--------+--------+ PTA      181               1.19 biphasic         +---------+------------------+-----+--------+--------+ PERO     164               1.08 biphasic         +---------+------------------+-----+--------+--------+ DP       167               1.10 biphasic         +---------+------------------+-----+--------+--------+ Great Toe105               0.69 Normal           +---------+------------------+-----+--------+--------+ +---------+------------------+-----+--------+-------+ Left     Lt Pressure (mmHg)IndexWaveformComment +---------+------------------+-----+--------+-------+ Brachial 152                                    +---------+------------------+-----+--------+-------+ CFA                             biphasic         +---------+------------------+-----+--------+-------+ Popliteal                       biphasic        +---------+------------------+-----+--------+-------+ PTA      180               1.18 biphasic        +---------+------------------+-----+--------+-------+ PERO     163               1.09 biphasic        +---------+------------------+-----+--------+-------+ DP       162               1.07 biphasic        +---------+------------------+-----+--------+-------+ Great Toe123               0.81 Normal          +---------+------------------+-----+--------+-------+   Summary: Right: Resting right ankle-brachial index is within normal range. The right toe-brachial index is normal. Left: Resting left ankle-brachial index is within normal range. The left toe-brachial index is normal. *See table(s) above for measurements and observations.  Electronically signed by Muhammad Arida MD on 11/19/2021 at 3:22:38 PM.    Final     Microbiology: Results for orders placed or performed during the hospital encounter of 02/15/21  Resp Panel by RT-PCR (Flu A&B, Covid) Nasopharyngeal Swab     Status: None   Collection Time: 02/16/21  2:44 AM   Specimen: Nasopharyngeal Swab; Nasopharyngeal(NP) swabs in vial transport medium  Result Value Ref Range Status   SARS Coronavirus 2 by RT PCR NEGATIVE NEGATIVE Final    Comment: (NOTE) SARS-CoV-2 target nucleic acids are NOT DETECTED.  The SARS-CoV-2 RNA is generally detectable in upper respiratory specimens during the acute phase of infection. The lowest concentration of SARS-CoV-2 viral copies this assay can detect is 138 copies/mL. A negative result does not preclude SARS-Cov-2 infection and should not be used as the sole basis for treatment or other patient management decisions.   A negative result may occur with  improper specimen collection/handling, submission of specimen other than nasopharyngeal swab, presence of viral mutation(s) within the areas  targeted by this assay, and inadequate number of viral copies(<138 copies/mL). A negative result must be combined with clinical observations, patient history, and epidemiological information. The expected result is Negative.  Fact Sheet for Patients:  EntrepreneurPulse.com.au  Fact Sheet for Healthcare Providers:  IncredibleEmployment.be  This test is no t yet approved or cleared by the [place] FDA and  has been authorized for detection and/or diagnosis of SARS-CoV-2 by FDA under an Emergency Use Authorization (EUA). This EUA will remain  in effect (meaning this test can be used) for the duration of the COVID-19 declaration under Section 564(b)(1) of the Act, 21 U.S.C.section 360bbb-3(b)(1), unless the authorization is terminated  or revoked sooner.       Influenza A by PCR NEGATIVE NEGATIVE Final   Influenza B by PCR NEGATIVE NEGATIVE Final    Comment: (NOTE) The Xpert Xpress SARS-CoV-2/FLU/RSV plus assay is intended as an aid in the diagnosis of influenza from Nasopharyngeal swab specimens and should not be used as a sole basis for treatment. Nasal washings and aspirates are unacceptable for Xpert Xpress SARS-CoV-2/FLU/RSV testing.  Fact Sheet for Patients: EntrepreneurPulse.com.au  Fact Sheet for Healthcare Providers: IncredibleEmployment.be  This test is not yet approved or cleared by the [place] FDA and has been authorized for detection and/or diagnosis of SARS-CoV-2 by FDA under an Emergency Use Authorization (EUA). This EUA will remain in effect (meaning this test can be used) for the duration of the COVID-19 declaration under Section 564(b)(1) of the Act, 21 U.S.C. section 360bbb-3(b)(1), unless the authorization is terminated or revoked.  Performed at [facility], [place] [address], [place], [place] [address]   MRSA Next Gen by PCR, Nasal     Status: None   Collection Time:  02/16/21  3:30 AM   Specimen: Nasal Mucosa; Nasal Swab  Result Value Ref Range Status   MRSA by PCR Next Gen NOT DETECTED NOT DETECTED Final    Comment: (NOTE) The GeneXpert MRSA Assay (FDA approved for NASAL specimens only), is one component of a comprehensive MRSA colonization surveillance program. It is not intended to diagnose MRSA infection nor to guide or monitor treatment for MRSA infections. Test performance is not FDA approved in patients less than 22 years old. Performed at [facility], [place] [address] [place], [place] [address]    Labs: CBC: Recent Labs  Lab 11/26/21 1658 11/27/21 0318 11/30/21 0315  WBC 7.2 5.8 7.9  NEUTROABS  --  3.7  --   HGB 12.6 11.5* 12.8  HCT 38.1 35.7* 40.3  MCV 92.5 93.9 94.6  PLT 232 204 794   Basic Metabolic Panel: Recent Labs  Lab 11/26/21 1658 11/27/21 0318 11/30/21 0315  NA 135 137 136  K 4.6 4.2 4.8  CL 99 104 102  CO2 _0 GLUCOSE 117* 111* 102*  BUN 16 14 26*  CREATININE 0.73 0.76 0.89  CALCIUM 8.9 8.5* 8.5*  MG  --  2.1  --    Liver Function Tests: Recent Labs  Lab 11/26/21 1658 11/27/21 0318  AST 15 14*  ALT 11 13  ALKPHOS 68 56  BILITOT 0.7 0.9  PROT 6.7 5.9*  ALBUMIN 3.9 3.1*   CBG: No results for input(s): "GLUCAP" in the last 168 hours.  Discharge time spent: greater than 30 minutes.  Signed: Berle Mull, MD [facility]

## 2021-12-01 NOTE — Progress Notes (Signed)
Patient states that stool is "stuck" and requests assistance. RN assists patient through coaching and gentle removal of stool at edge of rectum. Dr. Posey Pronto at bedside toward end of procedures and to prescribe Dulcolax if needed/desired by patient. Moderate amount of formed stool removed and patient not desiring of Dulcolax at this time. Ivan Anchors, RN 12/01/21 12:19 PM

## 2021-12-01 NOTE — TOC Transition Note (Signed)
Transition of Care [facility]) - CM/SW Discharge Note   Patient Details  Name: Carmen Haney MRN: [id number] Date of Birth: Jul 16, 1930  [facility] CM/SW Contact:  Lennart Pall, LCSW Phone Number: 12/01/2021, 10:56 AM   Clinical Narrative:    Have received SNF authorization for pt to admit to [facility] SNF today.  Pt is medically cleared for dc.  Pt and daughters aware and agreeable.  [facility] called and with request for 1:00pm pick up for pt.  RN to call report to [phone].  No further TOC needs.   Final next level of care: Skilled Nursing Facility Barriers to Discharge: Barriers Resolved   Patient Goals and CMS Choice Patient states their goals for this hospitalization and ongoing recovery are:: return home following SNF rehab CMS Medicare.gov Compare Post Acute Care list provided to:: Patient Choice offered to / list presented to : Patient, Adult Children  Discharge Placement PASRR number recieved: 11/27/21            Patient chooses bed at: [facility] Patient to be transferred to facility by: [facility] Name of family member notified: daughters Patient and family notified of of transfer: 12/01/21  Discharge Plan and Services In-house Referral: Clinical Social Work   Post Acute Care Choice: [facility]          DME Arranged: N/A DME Agency: NA                  Social Determinants of Health ([place]) Interventions     Readmission Risk Interventions    11/27/2021    1:51 PM  Readmission Risk Prevention Plan  Transportation Screening Complete  PCP or Specialist Appt within 5-7 Days Complete  Home Care Screening Complete  Medication Review (RN CM) Complete

## 2021-12-01 NOTE — Plan of Care (Signed)
Patient discharging to [facility] facility for rehab. Attempted to call report to [facility] staff; no answer and facility voice mail full. Placed discharge instructions in packet for facility staff. Awaiting transport by [facility]. Ivan Anchors, RN 12/01/21 12:17 PM

## 2021-12-03 DIAGNOSIS — I480 Paroxysmal atrial fibrillation: Secondary | ICD-10-CM | POA: Diagnosis not present

## 2021-12-03 DIAGNOSIS — I10 Essential (primary) hypertension: Secondary | ICD-10-CM | POA: Diagnosis not present

## 2021-12-03 DIAGNOSIS — Z9181 History of falling: Secondary | ICD-10-CM | POA: Diagnosis not present

## 2021-12-03 DIAGNOSIS — S82811D Torus fracture of upper end of right fibula, subsequent encounter for fracture with routine healing: Secondary | ICD-10-CM | POA: Diagnosis not present

## 2021-12-08 DIAGNOSIS — K5900 Constipation, unspecified: Secondary | ICD-10-CM | POA: Diagnosis not present

## 2021-12-08 DIAGNOSIS — L853 Xerosis cutis: Secondary | ICD-10-CM | POA: Diagnosis not present

## 2021-12-08 DIAGNOSIS — F419 Anxiety disorder, unspecified: Secondary | ICD-10-CM | POA: Diagnosis not present

## 2021-12-08 DIAGNOSIS — B379 Candidiasis, unspecified: Secondary | ICD-10-CM | POA: Diagnosis not present

## 2021-12-08 DIAGNOSIS — R350 Frequency of micturition: Secondary | ICD-10-CM | POA: Diagnosis not present

## 2021-12-10 DIAGNOSIS — N182 Chronic kidney disease, stage 2 (mild): Secondary | ICD-10-CM | POA: Diagnosis not present

## 2021-12-10 DIAGNOSIS — E871 Hypo-osmolality and hyponatremia: Secondary | ICD-10-CM | POA: Diagnosis not present

## 2021-12-10 DIAGNOSIS — N390 Urinary tract infection, site not specified: Secondary | ICD-10-CM | POA: Diagnosis not present

## 2021-12-17 DIAGNOSIS — M170 Bilateral primary osteoarthritis of knee: Secondary | ICD-10-CM | POA: Diagnosis not present

## 2022-01-03 ENCOUNTER — Other Ambulatory Visit: Payer: Self-pay | Admitting: Cardiology

## 2022-01-03 DIAGNOSIS — R600 Localized edema: Secondary | ICD-10-CM

## 2022-01-15 DIAGNOSIS — F331 Major depressive disorder, recurrent, moderate: Secondary | ICD-10-CM | POA: Diagnosis not present

## 2022-01-15 DIAGNOSIS — F32A Depression, unspecified: Secondary | ICD-10-CM | POA: Diagnosis not present

## 2022-01-15 DIAGNOSIS — M170 Bilateral primary osteoarthritis of knee: Secondary | ICD-10-CM | POA: Diagnosis not present

## 2022-01-15 DIAGNOSIS — I5030 Unspecified diastolic (congestive) heart failure: Secondary | ICD-10-CM | POA: Diagnosis not present

## 2022-01-15 DIAGNOSIS — M810 Age-related osteoporosis without current pathological fracture: Secondary | ICD-10-CM | POA: Diagnosis not present

## 2022-01-15 DIAGNOSIS — N183 Chronic kidney disease, stage 3 unspecified: Secondary | ICD-10-CM | POA: Diagnosis not present

## 2022-01-15 DIAGNOSIS — I4891 Unspecified atrial fibrillation: Secondary | ICD-10-CM | POA: Diagnosis not present

## 2022-01-15 DIAGNOSIS — I130 Hypertensive heart and chronic kidney disease with heart failure and stage 1 through stage 4 chronic kidney disease, or unspecified chronic kidney disease: Secondary | ICD-10-CM | POA: Diagnosis not present

## 2022-01-15 DIAGNOSIS — F419 Anxiety disorder, unspecified: Secondary | ICD-10-CM | POA: Diagnosis not present

## 2022-01-15 DIAGNOSIS — S82831D Other fracture of upper and lower end of right fibula, subsequent encounter for closed fracture with routine healing: Secondary | ICD-10-CM | POA: Diagnosis not present

## 2022-01-15 DIAGNOSIS — F411 Generalized anxiety disorder: Secondary | ICD-10-CM | POA: Diagnosis not present

## 2022-01-19 NOTE — Progress Notes (Signed)
HPI: FU coronary disease. Patient had a cardiac catheterization in 2002 that showed a 40% left main. Patient seen for syncope June 2017. Monitor showed sinus with rare PAC and PVC.  Nuclear study November 2021 showed ejection fraction 61% and no ischemia or infarction.  Found to be in atrial fibrillation February 2023.  Echocardiogram March 2023 showed normal LV function, grade 2 diastolic dysfunction, moderate biatrial enlargement.  ABIs November 2023 normal.  Carotid Dopplers November 2023 near normal bilaterally.  CT November 2023 showed small sub-5 mm pulmonary nodules and follow-up recommended 6 to 12 months.  Since she was last seen she has recently been discharged from rehab following hospitalization after falling.  She states her legs were weak.  She has some dyspnea with activities.  There is no orthopnea, PND, pedal edema or chest pain.  No syncope.  Current Outpatient Medications  Medication Sig Dispense Refill   acetaminophen (TYLENOL) 500 MG tablet Take 1,000 mg by mouth every 8 (eight) hours as needed for mild pain or headache.     apixaban (ELIQUIS) 5 MG TABS tablet Take 1 tablet (5 mg total) by mouth 2 (two) times daily. 180 tablet 3   Calcium-Phosphorus-Vitamin D (CALCIUM/D3 ADULT GUMMIES PO) Take 2 tablets by mouth in the morning.     docusate sodium (COLACE) 100 MG capsule Take 1 capsule (100 mg total) by mouth 2 (two) times daily. 10 capsule 0   levothyroxine (SYNTHROID) 150 MCG tablet Take 150 mcg by mouth daily before breakfast.     MAGNESIUM PO Take 20 mLs by mouth at bedtime.     metoprolol tartrate (LOPRESSOR) 25 MG tablet Take 0.5 tablets (12.5 mg total) by mouth 2 (two) times daily. 90 tablet 3   Polyethyl Glycol-Propyl Glycol (SYSTANE OP) Place 2-3 drops into both eyes at bedtime.     Probiotic Product (PROBIOTIC GUMMIES PO) Take 2 tablets by mouth in the morning.     sertraline (ZOLOFT) 100 MG tablet Take 200 mg by mouth at bedtime.     feeding supplement (ENSURE  ENLIVE / ENSURE PLUS) LIQD Take 237 mLs by mouth daily. (Patient not taking: Reported on 01/30/2022) 237 mL 12   furosemide (LASIX) 20 MG tablet TAKE ONE TABLET BY MOUTH DAILY (Patient not taking: Reported on 01/30/2022) 90 tablet 3   polyethylene glycol powder (GLYCOLAX/MIRALAX) 17 GM/SCOOP powder Take 17 g by mouth daily as needed for mild constipation. (Patient not taking: Reported on 01/30/2022)     traMADol (ULTRAM) 50 MG tablet Take 1 tablet (50 mg total) by mouth every 6 (six) hours as needed for moderate pain. (Patient not taking: Reported on 01/30/2022) 20 tablet 0   No current facility-administered medications for this visit.     Past Medical History:  Diagnosis Date   ACE-inhibitor cough    Breast CA ([place]) 2010   s/p lumpectomy & radiation   CAD (coronary artery disease) 07/1998   s/p cath in 2000. L main with 30 to 40%   Chronic diastolic heart failure (HCC)    Hearing loss    HTN (hypertension)    Hyperlipidemia    Obesities, morbid (HCC)    Osteoporosis    Paroxysmal atrial fibrillation (HCC)    Personal history of radiation therapy 2010   Right Breast Cancer   Thyroid disease    Vertebral fracture, pathological     Past Surgical History:  Procedure Laterality Date   BREAST BIOPSY Left    BREAST LUMPECTOMY Right March 2010  BREAST LUMPECTOMY WITH RADIOACTIVE SEED LOCALIZATION Left 09/20/2014   Procedure: LEFT BREAST LUMPECTOMY WITH RADIOACTIVE SEED LOCALIZATION;  Surgeon: Autumn Messing III, MD;  Location: [facility];  Service: General;  Laterality: Left;   CARPAL TUNNEL RELEASE Right    CESAREAN SECTION     EYE SURGERY     Right Eye   Skin Cancer Removed from Right Forearm     TOTAL ABDOMINAL HYSTERECTOMY      Social History   Socioeconomic History   Marital status: Widowed    Spouse name: Tressie Ellis   Number of children: Not on file   Years of education: Not on file   Highest education level: Not on file  Occupational History   Occupation: Retired  from [employer]: RETIRED  Tobacco Use   Smoking status: Former    Types: Cigarettes    Quit date: 01/13/1963    Years since quitting: 59.0   Smokeless tobacco: Never   Tobacco comments:    stopped in 1970's  Vaping Use   Vaping Use: Never used  Substance and Sexual Activity   Alcohol use: No    Alcohol/week: 0.0 standard drinks of alcohol   Drug use: No   Sexual activity: Not on file  Other Topics Concern   Not on file  Social History Narrative   Not on file   Social Determinants of Health   Financial Resource Strain: Not on file  Food Insecurity: No Food Insecurity (11/27/2021)   Hunger Vital Sign    Worried About Running Out of Food in the Last Year: Never true    [place] in the Last Year: Never true  Transportation Needs: No Transportation Needs (11/27/2021)   PRAPARE - Hydrologist (Medical): No    Lack of Transportation (Non-Medical): No  Physical Activity: Not on file  Stress: Not on file  Social Connections: Not on file  Intimate Partner Violence: Not At Risk (11/27/2021)   Humiliation, Afraid, Rape, and Kick questionnaire    Fear of Current or Ex-Partner: No    Emotionally Abused: No    Physically Abused: No    Sexually Abused: No    Family History  Problem Relation Age of Onset   Coronary artery disease Mother    Hypertension Mother    Melanoma Mother    Cancer Mother 9       breast   Breast cancer Mother    Cancer Father 82       prostate   Cancer Sister 69       breast   Breast cancer Sister    Cancer Maternal Aunt 48       breast   Cancer Sister 7       breast   Breast cancer Sister    Breast cancer Daughter 28    ROS: no fevers or chills, productive cough, hemoptysis, dysphasia, odynophagia, melena, hematochezia, dysuria, hematuria, rash, seizure activity, orthopnea, PND, pedal edema, claudication. Remaining systems are negative.  Physical Exam: Well-developed well-nourished in no acute  distress.  Skin is warm and dry.  HEENT is normal.  Neck is supple.  Chest is clear to auscultation with normal expansion.  Cardiovascular exam is regular rate and rhythm.  Abdominal exam nontender or distended. No masses palpated. Extremities show no edema. neuro grossly intact   A/P  1 coronary artery disease-patient denies chest pain-most recent nuclear study showed no ischemia.  Continue medical therapy including statin.  2  hypertension-blood pressure controlled.  Continue present medical regimen.  3 hyperlipidemia-continue statin.  4 paroxysmal atrial fibrillation-patient remains in sinus rhythm on exam.  Continue metoprolol for rate control if atrial fibrillation recurs.  Continue apixaban.  Note we discussed risk and benefit of anticoagulation today.  I explained that if she falls frequently we will need to consider discontinuing apixaban realizing there is a higher risk of CVA off of this medication.  However the risk may outweigh the benefit in the future.  5 pulmonary nodules-needs follow-up CT November 2024.  6 Lower extremity edema-no edema at present.  She is not requiring Lasix.  Will follow.  Kirk Ruths, MD

## 2022-01-21 DIAGNOSIS — S82831D Other fracture of upper and lower end of right fibula, subsequent encounter for closed fracture with routine healing: Secondary | ICD-10-CM | POA: Diagnosis not present

## 2022-01-21 DIAGNOSIS — I5030 Unspecified diastolic (congestive) heart failure: Secondary | ICD-10-CM | POA: Diagnosis not present

## 2022-01-21 DIAGNOSIS — M170 Bilateral primary osteoarthritis of knee: Secondary | ICD-10-CM | POA: Diagnosis not present

## 2022-01-21 DIAGNOSIS — M810 Age-related osteoporosis without current pathological fracture: Secondary | ICD-10-CM | POA: Diagnosis not present

## 2022-01-21 DIAGNOSIS — F419 Anxiety disorder, unspecified: Secondary | ICD-10-CM | POA: Diagnosis not present

## 2022-01-21 DIAGNOSIS — N183 Chronic kidney disease, stage 3 unspecified: Secondary | ICD-10-CM | POA: Diagnosis not present

## 2022-01-21 DIAGNOSIS — F32A Depression, unspecified: Secondary | ICD-10-CM | POA: Diagnosis not present

## 2022-01-21 DIAGNOSIS — I4891 Unspecified atrial fibrillation: Secondary | ICD-10-CM | POA: Diagnosis not present

## 2022-01-21 DIAGNOSIS — I130 Hypertensive heart and chronic kidney disease with heart failure and stage 1 through stage 4 chronic kidney disease, or unspecified chronic kidney disease: Secondary | ICD-10-CM | POA: Diagnosis not present

## 2022-01-22 DIAGNOSIS — F411 Generalized anxiety disorder: Secondary | ICD-10-CM | POA: Diagnosis not present

## 2022-01-22 DIAGNOSIS — I4891 Unspecified atrial fibrillation: Secondary | ICD-10-CM | POA: Diagnosis not present

## 2022-01-22 DIAGNOSIS — N183 Chronic kidney disease, stage 3 unspecified: Secondary | ICD-10-CM | POA: Diagnosis not present

## 2022-01-22 DIAGNOSIS — F32A Depression, unspecified: Secondary | ICD-10-CM | POA: Diagnosis not present

## 2022-01-22 DIAGNOSIS — S82831D Other fracture of upper and lower end of right fibula, subsequent encounter for closed fracture with routine healing: Secondary | ICD-10-CM | POA: Diagnosis not present

## 2022-01-22 DIAGNOSIS — M810 Age-related osteoporosis without current pathological fracture: Secondary | ICD-10-CM | POA: Diagnosis not present

## 2022-01-22 DIAGNOSIS — M170 Bilateral primary osteoarthritis of knee: Secondary | ICD-10-CM | POA: Diagnosis not present

## 2022-01-22 DIAGNOSIS — I5030 Unspecified diastolic (congestive) heart failure: Secondary | ICD-10-CM | POA: Diagnosis not present

## 2022-01-22 DIAGNOSIS — F331 Major depressive disorder, recurrent, moderate: Secondary | ICD-10-CM | POA: Diagnosis not present

## 2022-01-22 DIAGNOSIS — I130 Hypertensive heart and chronic kidney disease with heart failure and stage 1 through stage 4 chronic kidney disease, or unspecified chronic kidney disease: Secondary | ICD-10-CM | POA: Diagnosis not present

## 2022-01-22 DIAGNOSIS — F419 Anxiety disorder, unspecified: Secondary | ICD-10-CM | POA: Diagnosis not present

## 2022-01-26 DIAGNOSIS — M170 Bilateral primary osteoarthritis of knee: Secondary | ICD-10-CM | POA: Diagnosis not present

## 2022-01-26 DIAGNOSIS — N183 Chronic kidney disease, stage 3 unspecified: Secondary | ICD-10-CM | POA: Diagnosis not present

## 2022-01-26 DIAGNOSIS — S82831D Other fracture of upper and lower end of right fibula, subsequent encounter for closed fracture with routine healing: Secondary | ICD-10-CM | POA: Diagnosis not present

## 2022-01-26 DIAGNOSIS — M810 Age-related osteoporosis without current pathological fracture: Secondary | ICD-10-CM | POA: Diagnosis not present

## 2022-01-26 DIAGNOSIS — I5030 Unspecified diastolic (congestive) heart failure: Secondary | ICD-10-CM | POA: Diagnosis not present

## 2022-01-26 DIAGNOSIS — I4891 Unspecified atrial fibrillation: Secondary | ICD-10-CM | POA: Diagnosis not present

## 2022-01-26 DIAGNOSIS — F32A Depression, unspecified: Secondary | ICD-10-CM | POA: Diagnosis not present

## 2022-01-26 DIAGNOSIS — F419 Anxiety disorder, unspecified: Secondary | ICD-10-CM | POA: Diagnosis not present

## 2022-01-26 DIAGNOSIS — I130 Hypertensive heart and chronic kidney disease with heart failure and stage 1 through stage 4 chronic kidney disease, or unspecified chronic kidney disease: Secondary | ICD-10-CM | POA: Diagnosis not present

## 2022-01-28 DIAGNOSIS — I5030 Unspecified diastolic (congestive) heart failure: Secondary | ICD-10-CM | POA: Diagnosis not present

## 2022-01-28 DIAGNOSIS — S82831D Other fracture of upper and lower end of right fibula, subsequent encounter for closed fracture with routine healing: Secondary | ICD-10-CM | POA: Diagnosis not present

## 2022-01-28 DIAGNOSIS — F419 Anxiety disorder, unspecified: Secondary | ICD-10-CM | POA: Diagnosis not present

## 2022-01-28 DIAGNOSIS — N183 Chronic kidney disease, stage 3 unspecified: Secondary | ICD-10-CM | POA: Diagnosis not present

## 2022-01-28 DIAGNOSIS — M170 Bilateral primary osteoarthritis of knee: Secondary | ICD-10-CM | POA: Diagnosis not present

## 2022-01-28 DIAGNOSIS — I130 Hypertensive heart and chronic kidney disease with heart failure and stage 1 through stage 4 chronic kidney disease, or unspecified chronic kidney disease: Secondary | ICD-10-CM | POA: Diagnosis not present

## 2022-01-28 DIAGNOSIS — F32A Depression, unspecified: Secondary | ICD-10-CM | POA: Diagnosis not present

## 2022-01-28 DIAGNOSIS — I4891 Unspecified atrial fibrillation: Secondary | ICD-10-CM | POA: Diagnosis not present

## 2022-01-28 DIAGNOSIS — M810 Age-related osteoporosis without current pathological fracture: Secondary | ICD-10-CM | POA: Diagnosis not present

## 2022-01-29 DIAGNOSIS — F411 Generalized anxiety disorder: Secondary | ICD-10-CM | POA: Diagnosis not present

## 2022-01-29 DIAGNOSIS — F331 Major depressive disorder, recurrent, moderate: Secondary | ICD-10-CM | POA: Diagnosis not present

## 2022-01-30 ENCOUNTER — Encounter: Payer: Self-pay | Admitting: Cardiology

## 2022-01-30 ENCOUNTER — Ambulatory Visit: Payer: Medicare PPO | Attending: Cardiology | Admitting: Cardiology

## 2022-01-30 VITALS — BP 111/66 | HR 53 | Ht 63.0 in | Wt 194.0 lb

## 2022-01-30 DIAGNOSIS — I480 Paroxysmal atrial fibrillation: Secondary | ICD-10-CM | POA: Diagnosis not present

## 2022-01-30 DIAGNOSIS — I5032 Chronic diastolic (congestive) heart failure: Secondary | ICD-10-CM

## 2022-01-30 DIAGNOSIS — N183 Chronic kidney disease, stage 3 unspecified: Secondary | ICD-10-CM | POA: Diagnosis not present

## 2022-01-30 DIAGNOSIS — I10 Essential (primary) hypertension: Secondary | ICD-10-CM

## 2022-01-30 DIAGNOSIS — I2510 Atherosclerotic heart disease of native coronary artery without angina pectoris: Secondary | ICD-10-CM | POA: Diagnosis not present

## 2022-01-30 DIAGNOSIS — I130 Hypertensive heart and chronic kidney disease with heart failure and stage 1 through stage 4 chronic kidney disease, or unspecified chronic kidney disease: Secondary | ICD-10-CM | POA: Diagnosis not present

## 2022-01-30 DIAGNOSIS — I5030 Unspecified diastolic (congestive) heart failure: Secondary | ICD-10-CM | POA: Diagnosis not present

## 2022-01-30 DIAGNOSIS — I4891 Unspecified atrial fibrillation: Secondary | ICD-10-CM | POA: Diagnosis not present

## 2022-01-30 DIAGNOSIS — S82831D Other fracture of upper and lower end of right fibula, subsequent encounter for closed fracture with routine healing: Secondary | ICD-10-CM | POA: Diagnosis not present

## 2022-01-30 DIAGNOSIS — M810 Age-related osteoporosis without current pathological fracture: Secondary | ICD-10-CM | POA: Diagnosis not present

## 2022-01-30 DIAGNOSIS — F419 Anxiety disorder, unspecified: Secondary | ICD-10-CM | POA: Diagnosis not present

## 2022-01-30 DIAGNOSIS — F32A Depression, unspecified: Secondary | ICD-10-CM | POA: Diagnosis not present

## 2022-01-30 DIAGNOSIS — M170 Bilateral primary osteoarthritis of knee: Secondary | ICD-10-CM | POA: Diagnosis not present

## 2022-01-30 NOTE — Patient Instructions (Signed)
   Follow-Up: At [facility], you and your health needs are our priority.  As part of our continuing mission to provide you with exceptional heart care, we have created designated Provider Care Teams.  These Care Teams include your primary Cardiologist (physician) and Advanced Practice Providers (APPs -  Physician Assistants and Nurse Practitioners) who all work together to provide you with the care you need, when you need it.  We recommend signing up for the patient portal called "MyChart".  Sign up information is provided on this After Visit Summary.  MyChart is used to connect with patients for Virtual Visits (Telemedicine).  Patients are able to view lab/test results, encounter notes, upcoming appointments, etc.  Non-urgent messages can be sent to your provider as well.   To learn more about what you can do with MyChart, go to [email or web address].    Your next appointment:   6 month(s)  Provider:   Kirk Ruths, MD

## 2022-02-02 DIAGNOSIS — M170 Bilateral primary osteoarthritis of knee: Secondary | ICD-10-CM | POA: Diagnosis not present

## 2022-02-02 DIAGNOSIS — S82831D Other fracture of upper and lower end of right fibula, subsequent encounter for closed fracture with routine healing: Secondary | ICD-10-CM | POA: Diagnosis not present

## 2022-02-02 DIAGNOSIS — I130 Hypertensive heart and chronic kidney disease with heart failure and stage 1 through stage 4 chronic kidney disease, or unspecified chronic kidney disease: Secondary | ICD-10-CM | POA: Diagnosis not present

## 2022-02-02 DIAGNOSIS — M810 Age-related osteoporosis without current pathological fracture: Secondary | ICD-10-CM | POA: Diagnosis not present

## 2022-02-02 DIAGNOSIS — I4891 Unspecified atrial fibrillation: Secondary | ICD-10-CM | POA: Diagnosis not present

## 2022-02-02 DIAGNOSIS — N183 Chronic kidney disease, stage 3 unspecified: Secondary | ICD-10-CM | POA: Diagnosis not present

## 2022-02-02 DIAGNOSIS — I5030 Unspecified diastolic (congestive) heart failure: Secondary | ICD-10-CM | POA: Diagnosis not present

## 2022-02-02 DIAGNOSIS — F32A Depression, unspecified: Secondary | ICD-10-CM | POA: Diagnosis not present

## 2022-02-02 DIAGNOSIS — F419 Anxiety disorder, unspecified: Secondary | ICD-10-CM | POA: Diagnosis not present

## 2022-02-04 DIAGNOSIS — M170 Bilateral primary osteoarthritis of knee: Secondary | ICD-10-CM | POA: Diagnosis not present

## 2022-02-04 DIAGNOSIS — I4891 Unspecified atrial fibrillation: Secondary | ICD-10-CM | POA: Diagnosis not present

## 2022-02-04 DIAGNOSIS — M810 Age-related osteoporosis without current pathological fracture: Secondary | ICD-10-CM | POA: Diagnosis not present

## 2022-02-04 DIAGNOSIS — F331 Major depressive disorder, recurrent, moderate: Secondary | ICD-10-CM | POA: Diagnosis not present

## 2022-02-04 DIAGNOSIS — F419 Anxiety disorder, unspecified: Secondary | ICD-10-CM | POA: Diagnosis not present

## 2022-02-04 DIAGNOSIS — N183 Chronic kidney disease, stage 3 unspecified: Secondary | ICD-10-CM | POA: Diagnosis not present

## 2022-02-04 DIAGNOSIS — I130 Hypertensive heart and chronic kidney disease with heart failure and stage 1 through stage 4 chronic kidney disease, or unspecified chronic kidney disease: Secondary | ICD-10-CM | POA: Diagnosis not present

## 2022-02-04 DIAGNOSIS — S82831D Other fracture of upper and lower end of right fibula, subsequent encounter for closed fracture with routine healing: Secondary | ICD-10-CM | POA: Diagnosis not present

## 2022-02-04 DIAGNOSIS — F411 Generalized anxiety disorder: Secondary | ICD-10-CM | POA: Diagnosis not present

## 2022-02-04 DIAGNOSIS — F32A Depression, unspecified: Secondary | ICD-10-CM | POA: Diagnosis not present

## 2022-02-04 DIAGNOSIS — I5030 Unspecified diastolic (congestive) heart failure: Secondary | ICD-10-CM | POA: Diagnosis not present

## 2022-02-05 ENCOUNTER — Ambulatory Visit: Payer: Medicare PPO | Admitting: Orthopedic Surgery

## 2022-02-05 ENCOUNTER — Encounter: Payer: Self-pay | Admitting: Orthopedic Surgery

## 2022-02-05 VITALS — BP 110/70 | HR 53 | Temp 97.3°F | Resp 20 | Ht 63.0 in

## 2022-02-05 DIAGNOSIS — K5901 Slow transit constipation: Secondary | ICD-10-CM

## 2022-02-05 DIAGNOSIS — I2510 Atherosclerotic heart disease of native coronary artery without angina pectoris: Secondary | ICD-10-CM | POA: Diagnosis not present

## 2022-02-05 DIAGNOSIS — Z853 Personal history of malignant neoplasm of breast: Secondary | ICD-10-CM

## 2022-02-05 DIAGNOSIS — R413 Other amnesia: Secondary | ICD-10-CM

## 2022-02-05 DIAGNOSIS — S82831S Other fracture of upper and lower end of right fibula, sequela: Secondary | ICD-10-CM

## 2022-02-05 DIAGNOSIS — R32 Unspecified urinary incontinence: Secondary | ICD-10-CM

## 2022-02-05 DIAGNOSIS — I480 Paroxysmal atrial fibrillation: Secondary | ICD-10-CM | POA: Diagnosis not present

## 2022-02-05 DIAGNOSIS — M150 Primary generalized (osteo)arthritis: Secondary | ICD-10-CM

## 2022-02-05 DIAGNOSIS — N1831 Chronic kidney disease, stage 3a: Secondary | ICD-10-CM

## 2022-02-05 DIAGNOSIS — G629 Polyneuropathy, unspecified: Secondary | ICD-10-CM

## 2022-02-05 DIAGNOSIS — H903 Sensorineural hearing loss, bilateral: Secondary | ICD-10-CM | POA: Diagnosis not present

## 2022-02-05 DIAGNOSIS — R600 Localized edema: Secondary | ICD-10-CM

## 2022-02-05 DIAGNOSIS — M159 Polyosteoarthritis, unspecified: Secondary | ICD-10-CM

## 2022-02-05 DIAGNOSIS — M810 Age-related osteoporosis without current pathological fracture: Secondary | ICD-10-CM

## 2022-02-05 DIAGNOSIS — R911 Solitary pulmonary nodule: Secondary | ICD-10-CM | POA: Diagnosis not present

## 2022-02-05 DIAGNOSIS — E039 Hypothyroidism, unspecified: Secondary | ICD-10-CM

## 2022-02-05 DIAGNOSIS — I2583 Coronary atherosclerosis due to lipid rich plaque: Secondary | ICD-10-CM

## 2022-02-05 DIAGNOSIS — R531 Weakness: Secondary | ICD-10-CM

## 2022-02-05 DIAGNOSIS — F419 Anxiety disorder, unspecified: Secondary | ICD-10-CM | POA: Diagnosis not present

## 2022-02-05 DIAGNOSIS — I10 Essential (primary) hypertension: Secondary | ICD-10-CM | POA: Diagnosis not present

## 2022-02-05 DIAGNOSIS — F32A Depression, unspecified: Secondary | ICD-10-CM

## 2022-02-05 DIAGNOSIS — F331 Major depressive disorder, recurrent, moderate: Secondary | ICD-10-CM

## 2022-02-05 MED ORDER — BREXPIPRAZOLE 0.25 MG PO TABS: 0.2500 mg | ORAL_TABLET | Freq: Every day | ORAL | 0 refills | Status: DC

## 2022-02-05 NOTE — Patient Instructions (Signed)
Increase tylenol 1000 mg twice daily  Try voltaren gel 3-4 x/daily  Recommend discussing Cymbalta to help with chronic pain and depression/anxiety

## 2022-02-05 NOTE — Progress Notes (Signed)
Careteam: Patient Care Team: [name], NP as PCP - General (Adult Health Nurse Practitioner) Lelon Perla, MD as PCP - Cardiology (Cardiology)  Seen by: Windell Moulding, AGNP-C  PLACE OF SERVICE:  [place] Directive information Does Patient Have a Medical Advance Directive?: Yes, Type of Advance Directive: [place];Living will, Does patient want to make changes to medical advance directive?: No - Patient declined  Allergies  Allergen Reactions   Ace Inhibitors Cough   Latex Rash    Chief Complaint  Patient presents with   New Patient (Initial Visit)    Patient presents today for a new patient appointment.     HPI: Patient is a 89 y.o. female seen today to establish at [facility].   Previous provider Dr. Kelton Pillar at [place]. She wanted a geriatric provider. Orginally from [facility].  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 Husband passed 2 years ago. 2 daughters. Lives at home with daughter and son in law.   Daughters present during encounter today.   Depression/Anxiety- followed by psychologist, started Zoloft in 2017, [employer] started last week for crying spells HTN- BUN/creat 26/0.89 11/30/2021, remains on metoprolol PAF- followed by Dr. Stanford Breed, HR controlled with metoprolol, Eliquis for clot prevention BLE- remains on furosemide CAD- s/p cardiac cath 2000, remains on metoprolol CKD- BUN/creat 26/0.89 11/30/2021 Pulmonary nodules- f/u CT recommended 11/2022 Hypothyroidism- TSH 0.434 11/27/2021, still has thyroid, remains on levothyroxine Hearing loss- followed by audiology, wears bilateral hearing aids Osteoporosis- began after breast cancer, remains on calcium and vit d, DEXA 2019, t score -1.5 Constipation- remains on colace H/o right sided breast cancer- "Stage 0 ductal carcinoma in situ of the right breast (03/2008), ER positive, PR positive, S/P lumpectomy (04/2008) followed by adjuvant radiation therapy to the right  breast (completed 06/2008) followed by five years of anastrozole (completed 06/2013), S/P left lumpectomy for benign complex sclerosing lesion (09/2014), was followed in a program of surveillance, "  last mammogram 2021 OA multiple joints- knees most bothersome, reports she has "stiff joint disease" Urinary incontinence- seeing urologist soon, began after last hospitalization/stay in rehab H/o fall 11/26/2021> fracture right fibula> WBAT> completed rehab at [facility] and daily PT Weakness- difficulty sitting up in bed/chair, rolling over in bed, ambulating with walker X 2 weeks Impaired memory- admits to increased confusion at times, never had memory testing done Neuropathy- intermittent numbness to lower extremities, muscles will also tense up a times  Family history reviewed.   Smoking: stopped over 55 years ago, not a heavy smoker, only did for a few years  Drinking: no past use or abuse Illicit: no past use or abuse   Consumes 3 meals daily, does not drink Ensure No Exercise regimen  Eye exam: no recent exam Dental exam: last examination was a couple of years  Advanced directives complete.    Review of Systems:  Review of Systems  Constitutional:  Negative for chills, fever, malaise/fatigue and weight loss.  HENT:  Positive for hearing loss. Negative for congestion.   Eyes:  Negative for blurred vision and double vision.  Respiratory:  Negative for cough, shortness of breath and wheezing.   Cardiovascular:  Positive for leg swelling. Negative for chest pain.  Gastrointestinal:  Positive for constipation. Negative for abdominal pain, blood in stool, diarrhea, heartburn, nausea and vomiting.  Genitourinary:  Negative for dysuria and frequency.       Incontinence  Musculoskeletal:  Positive for falls, joint pain and myalgias.  Skin:  Negative for rash.  Neurological:  Positive for sensory change and weakness. Negative for dizziness and headaches.  Psychiatric/Behavioral:  Positive  for depression and memory loss. The patient is nervous/anxious.     Past Medical History:  Diagnosis Date   ACE-inhibitor cough    Breast CA ([place]) 2010   s/p lumpectomy & radiation   CAD (coronary artery disease) 07/1998   s/p cath in 2000. L [place] with 30 to 40%   Chronic diastolic heart failure (HCC)    Hearing loss    HTN (hypertension)    Hyperlipidemia    Obesities, morbid (HCC)    Osteoporosis    Paroxysmal atrial fibrillation (HCC)    Personal history of radiation therapy 2010   Right Breast Cancer   Thyroid disease    Vertebral fracture, pathological    Past Surgical History:  Procedure Laterality Date   BREAST BIOPSY Left    BREAST LUMPECTOMY Right March 2010   BREAST LUMPECTOMY WITH RADIOACTIVE SEED LOCALIZATION Left 09/20/2014   Procedure: LEFT BREAST LUMPECTOMY WITH RADIOACTIVE SEED LOCALIZATION;  Surgeon: Autumn Messing III, MD;  Location: [place];  Service: General;  Laterality: Left;   CARPAL TUNNEL RELEASE Right    CESAREAN SECTION     EYE SURGERY     Right Eye   Skin Cancer Removed from Right Forearm     TOTAL ABDOMINAL HYSTERECTOMY     Social History:   reports that she quit smoking about 59 years ago. Her smoking use included cigarettes. She has never used smokeless tobacco. She reports that she does not drink alcohol and does not use drugs.  Family History  Problem Relation Age of Onset   Coronary artery disease Mother    Hypertension Mother    Melanoma Mother    Cancer Mother 64       breast   Breast cancer Mother    Cancer Father 57       prostate   Cancer Sister 27       breast   Breast cancer Sister    Cancer Maternal Aunt 75       breast   Cancer Sister 15       breast   Breast cancer Sister    Breast cancer Daughter 72    Medications: Patient's Medications  New Prescriptions   No medications on file  Previous Medications   ACETAMINOPHEN (TYLENOL) 500 MG TABLET    Take 1,000 mg by mouth every 8 (eight) hours as needed  for mild pain or headache.   APIXABAN (ELIQUIS) 5 MG TABS TABLET    Take 1 tablet (5 mg total) by mouth 2 (two) times daily.   CALCIUM-PHOSPHORUS-VITAMIN D (CALCIUM/D3 ADULT GUMMIES PO)    Take 2 tablets by mouth in the morning.   DOCUSATE SODIUM (COLACE) 100 MG CAPSULE    Take 1 capsule (100 mg total) by mouth 2 (two) times daily.   FEEDING SUPPLEMENT (ENSURE ENLIVE / ENSURE PLUS) LIQD    Take 237 mLs by mouth daily.   FUROSEMIDE (LASIX) 20 MG TABLET    TAKE ONE TABLET BY MOUTH DAILY   LEVOTHYROXINE (SYNTHROID) 150 MCG TABLET    Take 150 mcg by mouth daily before breakfast.   MAGNESIUM PO    Take 20 mLs by mouth at bedtime.   METOPROLOL TARTRATE (LOPRESSOR) 25 MG TABLET    Take 0.5 tablets (12.5 mg total) by mouth 2 (two) times daily.   POLYETHYL GLYCOL-PROPYL GLYCOL (SYSTANE OP)  Place 2-3 drops into both eyes at bedtime.   POLYETHYLENE GLYCOL POWDER (GLYCOLAX/MIRALAX) 17 GM/SCOOP POWDER    Take 17 g by mouth daily as needed for mild constipation.   PROBIOTIC PRODUCT (PROBIOTIC GUMMIES PO)    Take 2 tablets by mouth in the morning.   SERTRALINE (ZOLOFT) 100 MG TABLET    Take 200 mg by mouth at bedtime.   TRAMADOL (ULTRAM) 50 MG TABLET    Take 1 tablet (50 mg total) by mouth every 6 (six) hours as needed for moderate pain.  Modified Medications   No medications on file  Discontinued Medications   No medications on file    Physical Exam:  There were no vitals filed for this visit. There is no height or weight on file to calculate BMI. Wt Readings from Last 3 Encounters:  01/30/22 194 lb (88 kg)  11/30/21 190 lb (86.2 kg)  11/05/21 193 lb 9.6 oz (87.8 kg)    Physical Exam Vitals reviewed.  Constitutional:      General: She is not in acute distress.    Appearance: She is not ill-appearing.  HENT:     Head: Normocephalic.  Eyes:     General:        Right eye: No discharge.        Left eye: No discharge.  Cardiovascular:     Rate and Rhythm: Normal rate. Rhythm irregular.      Pulses: Normal pulses.     Heart sounds: Normal heart sounds.  Pulmonary:     Effort: Pulmonary effort is normal. No respiratory distress.     Breath sounds: Normal breath sounds. No wheezing.  Abdominal:     General: Bowel sounds are normal. There is no distension.     Palpations: Abdomen is soft.     Tenderness: There is no abdominal tenderness.  Musculoskeletal:     Cervical back: Neck supple.     Right lower leg: Edema present.     Left lower leg: Edema present.     Comments: Non pitting  Skin:    General: Skin is warm and dry.     Capillary Refill: Capillary refill takes less than 2 seconds.  Neurological:     General: No focal deficit present.     Mental Status: She is alert and oriented to person, place, and time.     Motor: Weakness present.     Gait: Gait abnormal.     Comments: wheelchair  Psychiatric:        Mood and Affect: Mood is anxious.        Behavior: Behavior normal.     Comments: Very pleasant, follows commands, alert to self/person/situation     Labs reviewed: Basic Metabolic Panel: Recent Labs    02/15/21 2250 02/16/21 0402 11/26/21 1658 11/27/21 0318 11/27/21 0730 11/30/21 0315  NA 136   < > 135 137  --  136  K 4.1   < > 4.6 4.2  --  4.8  CL 98   < > 99 104  --  102  CO2 27   < > 30 28  --  27  GLUCOSE 118*   < > 117* 111*  --  102*  BUN 14   < > 16 14  --  26*  CREATININE 1.02*   < > 0.73 0.76  --  0.89  CALCIUM 8.4*   < > 8.9 8.5*  --  8.5*  MG 2.1  --   --  2.1  --   --  TSH 1.371  --   --   --  0.434  --    < > = values in this interval not displayed.   Liver Function Tests: Recent Labs    11/26/21 1658 11/27/21 0318  AST 15 14*  ALT 11 13  ALKPHOS 68 56  BILITOT 0.7 0.9  PROT 6.7 5.9*  ALBUMIN 3.9 3.1*   No results for input(s): "LIPASE", "AMYLASE" in the last 8760 hours. No results for input(s): "AMMONIA" in the last 8760 hours. CBC: Recent Labs    11/26/21 1658 11/27/21 0318 11/30/21 0315  WBC 7.2 5.8 7.9   NEUTROABS  --  3.7  --   HGB 12.6 11.5* 12.8  HCT 38.1 35.7* 40.3  MCV 92.5 93.9 94.6  PLT 232 204 263   Lipid Panel: No results for input(s): "CHOL", "HDL", "LDLCALC", "TRIG", "CHOLHDL", "LDLDIRECT" in the last 8760 hours. TSH: Recent Labs    02/15/21 2250 11/27/21 0730  TSH 1.371 0.434   A1C: No results found for: "HGBA1C"   Assessment/Plan 1. Anxiety and depression - followed by psychiatrist - weekly therapy sessions - on Zoloft since 2017> Na+ 136 11/30/2021 - started Rexulti last week due to crying spells  - brexpiprazole (REXULTI) 0.25 MG TABS tablet; Take 1 tablet (0.25 mg total) by mouth daily.  Dispense: 60 tablet; Refill: 0 - may benefit from Cymbalta due to chronic pains/depression/anxiety> advised to ask specalist  2. Essential hypertension - controlled, goal < 150/90 - BUN/creat 26/0.89 11/30/2021 - cont metoprolol  3. Paroxysmal atrial fibrillation (HCC) - followed by Dr. Stanford Breed - HR< 100 with metoprolol - cont Eliquis for clot prevention  4. Edema of both lower extremities - non pitting - cont furosemide  5. Coronary artery disease due to lipid rich plaque - s/p cardiac cath 2000 - not on statin - cont metoprolol  6. Stage 3a chronic kidney disease ([place]) - - BUN/creat 26/0.89 11/30/2021 - avoid nephrotoxic drugs like NSAIDS and dose adjust medications to be renally excreted - encourage hydration with water   7. Pulmonary nodule - CT chest noted scattered sub 5 mm pulmonary nodule, likely benign> f/u in 6-12 months> 11/2022  8. Hypothyroidism (acquired) - TSH stable - cont levothyroxine  9. Sensorineural hearing loss (SNHL), bilateral - followed by audiology  10. Age-related osteoporosis without current pathological fracture - DEXA 2019, t score -1.5 - cont calcium/vitamin d supplement  11. Slow transit constipation - abdomen soft - cont colace  12. History of breast cancer - Stage 0 ductal carcinoma in situ of the right breast  (03/2008) - S/P lumpectomy (04/2008) - completed adjuvant radiation therapy to the right breast (06/2008)  - completed 5 years anastrozole (06/2013),  - S/P left lumpectomy for benign complex sclerosing lesion (09/2014) - Mammogram 12/2019> upper outer right breast calcifications> f/u in 1 year  13. Primary osteoarthritis involving multiple joints - bilateral knee pain - cont tylenol prn - recommend voltaren gel  14. Urinary incontinence, unspecified type - began after hospitalization 11/2021 - scheduled to see urologist - avoid caffeine - limit fluids prior to bedtime  15. Closed fracture of proximal end of right fibula, unspecified fracture morphology, sequela - hospitalized 11/15-11/20 - conservative measures - completed SNF/PT -  WBAT - mobility issues after injury> non ambulatory  16. Weakness - ongoing - see above - nonambulatory at this time - trouble with transfers/ bed mobility - recommend additional PT/OT  17. Impaired memory - MMSE never done - no behaviors - increased anxiety  18.  Neuropathy - intermittent numbness to lower extremities or muscles tensing up - CT spine noted acute compression fracture to T12, multilevel spinal and lateral recess stenosis 11/26/2021 - ? spinal involvement related to symptoms/ongoing weakness  Future labs/tests: cbc/diff, cmp, MMSE, discuss tdap, mammogram, PT/OT   Total time: 61 minutes. Greater than 50% of total time spent doing patient education regarding health maintenance, HTN, afib, impaired memory, weakness/falls, and anxiety/depression.    Next appt: 03/12/2022  Windell Moulding, [facility] [phone]

## 2022-02-09 DIAGNOSIS — F331 Major depressive disorder, recurrent, moderate: Secondary | ICD-10-CM | POA: Diagnosis not present

## 2022-02-09 DIAGNOSIS — F411 Generalized anxiety disorder: Secondary | ICD-10-CM | POA: Diagnosis not present

## 2022-02-10 ENCOUNTER — Other Ambulatory Visit: Payer: Self-pay

## 2022-02-10 ENCOUNTER — Telehealth: Payer: Self-pay | Admitting: *Deleted

## 2022-02-10 ENCOUNTER — Emergency Department (HOSPITAL_COMMUNITY): Payer: Medicare PPO

## 2022-02-10 ENCOUNTER — Observation Stay (HOSPITAL_COMMUNITY)
Admission: EM | Admit: 2022-02-10 | Discharge: 2022-02-14 | Disposition: A | Discharge status: Home / Self Care | Payer: Medicare PPO | Attending: Student | Admitting: Student

## 2022-02-10 ENCOUNTER — Telehealth: Payer: Self-pay

## 2022-02-10 ENCOUNTER — Encounter (HOSPITAL_COMMUNITY): Payer: Self-pay | Admitting: Oncology

## 2022-02-10 DIAGNOSIS — G2582 Stiff-man syndrome: Secondary | ICD-10-CM | POA: Diagnosis not present

## 2022-02-10 DIAGNOSIS — I480 Paroxysmal atrial fibrillation: Secondary | ICD-10-CM | POA: Diagnosis not present

## 2022-02-10 DIAGNOSIS — R531 Weakness: Principal | ICD-10-CM | POA: Insufficient documentation

## 2022-02-10 DIAGNOSIS — R457 State of emotional shock and stress, unspecified: Secondary | ICD-10-CM | POA: Diagnosis not present

## 2022-02-10 DIAGNOSIS — R937 Abnormal findings on diagnostic imaging of other parts of musculoskeletal system: Secondary | ICD-10-CM | POA: Diagnosis not present

## 2022-02-10 DIAGNOSIS — Z7901 Long term (current) use of anticoagulants: Secondary | ICD-10-CM | POA: Insufficient documentation

## 2022-02-10 DIAGNOSIS — Z91040 Latex allergy status: Secondary | ICD-10-CM | POA: Insufficient documentation

## 2022-02-10 DIAGNOSIS — E871 Hypo-osmolality and hyponatremia: Secondary | ICD-10-CM | POA: Insufficient documentation

## 2022-02-10 DIAGNOSIS — I10 Essential (primary) hypertension: Secondary | ICD-10-CM | POA: Diagnosis present

## 2022-02-10 DIAGNOSIS — M79661 Pain in right lower leg: Secondary | ICD-10-CM | POA: Insufficient documentation

## 2022-02-10 DIAGNOSIS — R32 Unspecified urinary incontinence: Secondary | ICD-10-CM | POA: Insufficient documentation

## 2022-02-10 DIAGNOSIS — E785 Hyperlipidemia, unspecified: Secondary | ICD-10-CM | POA: Diagnosis present

## 2022-02-10 DIAGNOSIS — I2510 Atherosclerotic heart disease of native coronary artery without angina pectoris: Secondary | ICD-10-CM | POA: Diagnosis not present

## 2022-02-10 DIAGNOSIS — R760 Raised antibody titer: Secondary | ICD-10-CM | POA: Insufficient documentation

## 2022-02-10 DIAGNOSIS — M79662 Pain in left lower leg: Secondary | ICD-10-CM | POA: Diagnosis not present

## 2022-02-10 DIAGNOSIS — Z853 Personal history of malignant neoplasm of breast: Secondary | ICD-10-CM | POA: Diagnosis not present

## 2022-02-10 DIAGNOSIS — I110 Hypertensive heart disease with heart failure: Secondary | ICD-10-CM | POA: Insufficient documentation

## 2022-02-10 DIAGNOSIS — M79605 Pain in left leg: Secondary | ICD-10-CM | POA: Diagnosis not present

## 2022-02-10 DIAGNOSIS — M810 Age-related osteoporosis without current pathological fracture: Secondary | ICD-10-CM | POA: Diagnosis present

## 2022-02-10 DIAGNOSIS — F32A Depression, unspecified: Secondary | ICD-10-CM | POA: Diagnosis present

## 2022-02-10 DIAGNOSIS — R252 Cramp and spasm: Secondary | ICD-10-CM | POA: Diagnosis present

## 2022-02-10 DIAGNOSIS — I4891 Unspecified atrial fibrillation: Secondary | ICD-10-CM | POA: Diagnosis present

## 2022-02-10 DIAGNOSIS — M8448XA Pathological fracture, other site, initial encounter for fracture: Secondary | ICD-10-CM | POA: Diagnosis present

## 2022-02-10 DIAGNOSIS — Z79899 Other long term (current) drug therapy: Secondary | ICD-10-CM | POA: Insufficient documentation

## 2022-02-10 DIAGNOSIS — D0511 Intraductal carcinoma in situ of right breast: Secondary | ICD-10-CM | POA: Diagnosis present

## 2022-02-10 DIAGNOSIS — R29818 Other symptoms and signs involving the nervous system: Secondary | ICD-10-CM | POA: Diagnosis not present

## 2022-02-10 DIAGNOSIS — W19XXXA Unspecified fall, initial encounter: Secondary | ICD-10-CM | POA: Diagnosis not present

## 2022-02-10 DIAGNOSIS — M79604 Pain in right leg: Secondary | ICD-10-CM | POA: Diagnosis not present

## 2022-02-10 DIAGNOSIS — E039 Hypothyroidism, unspecified: Secondary | ICD-10-CM | POA: Diagnosis present

## 2022-02-10 DIAGNOSIS — I5032 Chronic diastolic (congestive) heart failure: Secondary | ICD-10-CM | POA: Insufficient documentation

## 2022-02-10 DIAGNOSIS — R29898 Other symptoms and signs involving the musculoskeletal system: Secondary | ICD-10-CM | POA: Diagnosis present

## 2022-02-10 DIAGNOSIS — H903 Sensorineural hearing loss, bilateral: Secondary | ICD-10-CM | POA: Diagnosis present

## 2022-02-10 DIAGNOSIS — I6782 Cerebral ischemia: Secondary | ICD-10-CM | POA: Diagnosis not present

## 2022-02-10 DIAGNOSIS — J323 Chronic sphenoidal sinusitis: Secondary | ICD-10-CM | POA: Diagnosis not present

## 2022-02-10 DIAGNOSIS — M62838 Other muscle spasm: Secondary | ICD-10-CM | POA: Diagnosis not present

## 2022-02-10 DIAGNOSIS — F419 Anxiety disorder, unspecified: Secondary | ICD-10-CM | POA: Diagnosis present

## 2022-02-10 LAB — CBC WITH DIFFERENTIAL/PLATELET
Abs Immature Granulocytes: 0.04 10*3/uL (ref 0.00–0.07)
Basophils Absolute: 0 10*3/uL (ref 0.0–0.1)
Basophils Relative: 0 %
Eosinophils Absolute: 0.1 10*3/uL (ref 0.0–0.5)
Eosinophils Relative: 1 %
HCT: 41.4 % (ref 36.0–46.0)
Hemoglobin: 13.6 g/dL (ref 12.0–15.0)
Immature Granulocytes: 0 %
Lymphocytes Relative: 13 %
Lymphs Abs: 1.2 10*3/uL (ref 0.7–4.0)
MCH: 30.3 pg (ref 26.0–34.0)
MCHC: 32.9 g/dL (ref 30.0–36.0)
MCV: 92.2 fL (ref 80.0–100.0)
Monocytes Absolute: 1.1 10*3/uL — ABNORMAL HIGH (ref 0.1–1.0)
Monocytes Relative: 12 %
Neutro Abs: 6.6 10*3/uL (ref 1.7–7.7)
Neutrophils Relative %: 74 %
Platelets: 244 10*3/uL (ref 150–400)
RBC: 4.49 MIL/uL (ref 3.87–5.11)
RDW: 14.5 % (ref 11.5–15.5)
WBC: 9.2 10*3/uL (ref 4.0–10.5)
nRBC: 0 % (ref 0.0–0.2)

## 2022-02-10 LAB — COMPREHENSIVE METABOLIC PANEL
ALT: 14 U/L (ref 0–44)
AST: 26 U/L (ref 15–41)
Albumin: 3.7 g/dL (ref 3.5–5.0)
Alkaline Phosphatase: 73 U/L (ref 38–126)
Anion gap: 11 (ref 5–15)
BUN: 20 mg/dL (ref 8–23)
CO2: 28 mmol/L (ref 22–32)
Calcium: 8.6 mg/dL — ABNORMAL LOW (ref 8.9–10.3)
Chloride: 92 mmol/L — ABNORMAL LOW (ref 98–111)
Creatinine, Ser: 0.83 mg/dL (ref 0.44–1.00)
GFR, Estimated: >60 mL/min (ref >60)
Glucose, Bld: 113 mg/dL — ABNORMAL HIGH (ref 70–99)
Potassium: 4.3 mmol/L (ref 3.5–5.1)
Sodium: 131 mmol/L — ABNORMAL LOW (ref 135–145)
Total Bilirubin: 0.7 mg/dL (ref 0.3–1.2)
Total Protein: 7.7 g/dL (ref 6.5–8.1)

## 2022-02-10 LAB — MAGNESIUM: Magnesium: 2.4 mg/dL (ref 1.7–2.4)

## 2022-02-10 MED ORDER — SERTRALINE HCL 100 MG PO TABS
100.0000 mg | ORAL_TABLET | Freq: Every day | ORAL | Status: DC
  Administered 2022-02-10: 100 mg via ORAL
  Filled 2022-02-10: qty 1

## 2022-02-10 MED ORDER — SERTRALINE HCL 50 MG PO TABS: 200.0000 mg | ORAL_TABLET | Freq: Every day | ORAL | Status: DC

## 2022-02-10 MED ORDER — DOCUSATE SODIUM 100 MG PO CAPS
100.0000 mg | ORAL_CAPSULE | Freq: Two times a day (BID) | ORAL | Status: DC
  Administered 2022-02-10 – 2022-02-14 (×8): 100 mg via ORAL
  Filled 2022-02-10 (×8): qty 1

## 2022-02-10 MED ORDER — LEVOTHYROXINE SODIUM 50 MCG PO TABS
150.0000 ug | ORAL_TABLET | Freq: Every day | ORAL | Status: DC
  Administered 2022-02-11 – 2022-02-14 (×4): 150 ug via ORAL
  Filled 2022-02-10 (×4): qty 1

## 2022-02-10 MED ORDER — BREXPIPRAZOLE 0.25 MG PO TABS: 0.2500 mg | ORAL_TABLET | Freq: Every day | ORAL | Status: DC

## 2022-02-10 MED ORDER — METOPROLOL TARTRATE 5 MG/5ML IV SOLN: 5.0000 mg | Freq: Four times a day (QID) | INTRAVENOUS | Status: DC | PRN

## 2022-02-10 MED ORDER — SODIUM CHLORIDE 0.9 % IV SOLN: 250.0000 mL | INTRAVENOUS | Status: DC | PRN

## 2022-02-10 MED ORDER — ONDANSETRON HCL 4 MG/2ML IJ SOLN: 4.0000 mg | Freq: Four times a day (QID) | INTRAMUSCULAR | Status: DC | PRN

## 2022-02-10 MED ORDER — PROBIOTIC GUMMIES 30 MG PO CHEW: CHEWABLE_TABLET | Freq: Every morning | ORAL | Status: DC

## 2022-02-10 MED ORDER — RISAQUAD PO CAPS
1.0000 | ORAL_CAPSULE | Freq: Every day | ORAL | Status: DC
  Administered 2022-02-11 – 2022-02-14 (×4): 1 via ORAL
  Filled 2022-02-10 (×4): qty 1

## 2022-02-10 MED ORDER — FENTANYL CITRATE PF 50 MCG/ML IJ SOSY: 12.5000 ug | PREFILLED_SYRINGE | INTRAMUSCULAR | Status: DC | PRN

## 2022-02-10 MED ORDER — METOPROLOL TARTRATE 25 MG PO TABS
12.5000 mg | ORAL_TABLET | Freq: Two times a day (BID) | ORAL | Status: DC
  Administered 2022-02-10 – 2022-02-13 (×5): 12.5 mg via ORAL
  Filled 2022-02-10 (×8): qty 1

## 2022-02-10 MED ORDER — OYSTER SHELL CALCIUM/D3 500-5 MG-MCG PO TABS
1.0000 | ORAL_TABLET | Freq: Every day | ORAL | Status: DC
  Administered 2022-02-11 – 2022-02-14 (×4): 1 via ORAL
  Filled 2022-02-10 (×4): qty 1

## 2022-02-10 MED ORDER — HYDROCODONE-ACETAMINOPHEN 5-325 MG PO TABS
1.0000 | ORAL_TABLET | ORAL | Status: DC | PRN
  Administered 2022-02-10 – 2022-02-11 (×2): 1 via ORAL
  Filled 2022-02-10 (×2): qty 1

## 2022-02-10 MED ORDER — ONDANSETRON HCL 4 MG PO TABS: 4.0000 mg | ORAL_TABLET | Freq: Four times a day (QID) | ORAL | Status: DC | PRN

## 2022-02-10 MED ORDER — CALCIUM-PHOSPHORUS-VITAMIN D 200-96.6-200 MG-MG-UNIT PO CHEW: CHEWABLE_TABLET | Freq: Every morning | ORAL | Status: DC

## 2022-02-10 MED ORDER — SODIUM CHLORIDE 0.9% FLUSH
3.0000 mL | Freq: Two times a day (BID) | INTRAVENOUS | Status: DC
  Administered 2022-02-10 – 2022-02-13 (×6): 3 mL via INTRAVENOUS

## 2022-02-10 MED ORDER — APIXABAN 5 MG PO TABS
5.0000 mg | ORAL_TABLET | Freq: Two times a day (BID) | ORAL | Status: DC
  Administered 2022-02-10: 5 mg via ORAL
  Filled 2022-02-10: qty 1

## 2022-02-10 MED ORDER — POLYETHYLENE GLYCOL 3350 17 G PO PACK: 17.0000 g | PACK | Freq: Every day | ORAL | Status: DC | PRN

## 2022-02-10 MED ORDER — ACETAMINOPHEN 500 MG PO TABS
1000.0000 mg | ORAL_TABLET | Freq: Three times a day (TID) | ORAL | Status: DC | PRN
  Administered 2022-02-10: 1000 mg via ORAL
  Filled 2022-02-10: qty 2

## 2022-02-10 MED ORDER — LORAZEPAM 2 MG/ML IJ SOLN
0.5000 mg | Freq: Once | INTRAMUSCULAR | Status: AC
  Administered 2022-02-10: 0.5 mg via INTRAVENOUS
  Filled 2022-02-10: qty 1

## 2022-02-10 MED ORDER — TRAZODONE HCL 50 MG PO TABS
25.0000 mg | ORAL_TABLET | Freq: Every evening | ORAL | Status: DC | PRN
  Filled 2022-02-10: qty 1

## 2022-02-10 MED ORDER — SODIUM CHLORIDE 0.9% FLUSH: 3.0000 mL | INTRAVENOUS | Status: DC | PRN

## 2022-02-10 NOTE — Assessment & Plan Note (Signed)
Needs hearing aids or for the person to speak loudly to her

## 2022-02-10 NOTE — Telephone Encounter (Signed)
Patient daughter, [name] called and stated that patient needs a Urgent Referral placed to [facility].   Stated that they had to call 911 yesterday and have them help get patient into bed because she could not feel her legs nor walk. Stated that she is so weak and unsteady.   Cannot bring her into office due to patient is not mobile. Requesting urgent referral to be placed.   Please Advise.

## 2022-02-10 NOTE — Assessment & Plan Note (Signed)
Noted at T12 after her last fall Likely due to osteoporosis and wonder if this is contributing to her overall pain and weakness

## 2022-02-10 NOTE — Telephone Encounter (Signed)
Patient daughter has called back to state that they did get an appointment for Neurology for tomorrow but she might just follow the advice for the ED as she seem to not be getting any better as the day goes on.

## 2022-02-10 NOTE — ED Provider Notes (Signed)
[facility] Provider Note   CSN: [id number] Arrival date & time: 02/10/22  1535     History  Chief Complaint  Patient presents with   Spasms    Carmen Haney is a 89 y.o. female.  HPI Patient presents with a week this and her whole body spasms.  Reportedly since November has been having issues walking.  States she has had weakness.  Had been in rehab and had been up to the point where she would walk with a walker but now cannot walk at all.  Reportedly has spasms that come and go.  In both her legs and both the arms.  States they will take over her whole body.  She is awake during the episodes however.  States cannot get up and move around.  Reviewed PCP note and plan for neurology follow-up.  Although also diagnosed with anxiety.   Past Medical History:  Diagnosis Date   ACE-inhibitor cough    Breast CA ([facility]) 2010   s/p lumpectomy & radiation   CAD (coronary artery disease) 07/1998   s/p cath in 2000. L main with 30 to 40%   Chronic diastolic heart failure (HCC)    Hearing loss    HTN (hypertension)    Hyperlipidemia    Obesities, morbid (HCC)    Osteoporosis    Paroxysmal atrial fibrillation (HCC)    Personal history of radiation therapy 2010   Right Breast Cancer   Thyroid disease    Vertebral fracture, pathological     Home Medications Prior to Admission medications   Medication Sig Start Date End Date Taking? Authorizing Provider  acetaminophen (TYLENOL) 500 MG tablet Take 1,000 mg by mouth in the morning and at bedtime.   Yes [provider]  apixaban (ELIQUIS) 5 MG TABS tablet Take 1 tablet (5 mg total) by mouth 2 (two) times daily. Patient taking differently: Take 5 mg by mouth in the morning and at bedtime. 02/16/21  Yes Duke, Tami Lin, PA  brexpiprazole (REXULTI) 0.25 MG TABS tablet Take 1 tablet (0.25 mg total) by mouth daily. 02/05/22  Yes Fargo, Amy E, NP  Calcium-Phosphorus-Vitamin D (CALCIUM/D3  ADULT GUMMIES PO) Take 500 mg by mouth in the morning.   Yes [provider]  docusate sodium (COLACE) 100 MG capsule Take 1 capsule (100 mg total) by mouth 2 (two) times daily. Patient taking differently: Take 100 mg by mouth at bedtime. 12/01/21  Yes Lavina Hamman, MD  DULoxetine (CYMBALTA) 20 MG capsule Take 20 mg by mouth See admin instructions. Starting on 02/10/2022, take 20 mg by mouth once a day for 7 days 02/10/22 02/17/22 Yes [provider]  levothyroxine (SYNTHROID) 150 MCG tablet Take 150 mcg by mouth daily before breakfast. 04/07/21  Yes [provider]  LORazepam (ATIVAN) 0.5 MG tablet Take 0.5 mg by mouth 2 (two) times daily as needed for anxiety. 02/09/22  Yes [provider]  MAGNESIUM PO Take 20 mLs by mouth at bedtime.   Yes [provider]  metoprolol tartrate (LOPRESSOR) 25 MG tablet Take 0.5 tablets (12.5 mg total) by mouth 2 (two) times daily. Patient taking differently: Take 12.5 mg by mouth See admin instructions. Take 12.5 mg by mouth in the morning and evening 02/16/21  Yes Duke, Tami Lin, PA  Probiotic Product (PROBIOTIC GUMMIES PO) Take 2 tablets by mouth in the morning.   Yes [provider]  sertraline (ZOLOFT) 100 MG tablet Take 100 mg by  mouth at bedtime. 02/10/22 02/18/22 Yes [provider]  DULoxetine (CYMBALTA) 30 MG capsule Take 30 mg by mouth See admin instructions. Starting on 02/18/2022, take 30 mg by mouth once a day for 7 days Patient not taking: Reported on 02/10/2022 02/18/22 02/25/22  [provider]  DULoxetine HCl 40 MG CPEP Take 40 mg by mouth See admin instructions. Starting on 02/19/2022, take 40 mg by mouth once a day Patient not taking: Reported on 02/10/2022 02/26/22   [provider]  feeding supplement (ENSURE ENLIVE / ENSURE PLUS) LIQD Take 237 mLs by mouth daily. Patient not taking: Reported on 02/10/2022 12/01/21   Lavina Hamman, MD  furosemide (LASIX) 20 MG tablet  TAKE ONE TABLET BY MOUTH DAILY Patient not taking: Reported on 01/30/2022 01/06/22   Lelon Perla, MD  traMADol (ULTRAM) 50 MG tablet Take 1 tablet (50 mg total) by mouth every 6 (six) hours as needed for moderate pain. Patient not taking: Reported on 02/10/2022 12/01/21 12/01/22  Lavina Hamman, MD  olmesartan (BENICAR) 20 MG tablet Take 20 mg by mouth daily.    04/25/10  [provider]      Allergies    Ace inhibitors and Latex    Review of Systems   Review of Systems  Physical Exam Updated Vital Signs BP 138/66 (BP Location: Right Arm)   Pulse 72   Temp 100.1 F (37.8 C) (Oral)   Resp 14   Wt 92.9 kg   SpO2 96%   BMI 36.28 kg/m  Physical Exam Vitals reviewed.  HENT:     Head: Normocephalic.  Eyes:     Extraocular Movements: Extraocular movements intact.  Pulmonary:     Breath sounds: No wheezing or rhonchi.  Abdominal:     Tenderness: There is no abdominal tenderness.  Neurological:     Mental Status: She is alert.     Comments: Moving both upper extremities freely.  Decreased movement bilateral lower extremities.  Can flex and extend at the ankle.  States sensation is intact.  Some mottling of both feet with patient states is chronic.  Both feet are cool.  Pulses palpable prickly on right foot and also left popliteal.  Psychiatric:     Comments: Appears anxious.     ED Results / Procedures / Treatments   Labs (all labs ordered are listed, but only abnormal results are displayed) Labs Reviewed  COMPREHENSIVE METABOLIC PANEL - Abnormal; Notable for the following components:      Result Value   Sodium 131 (*)    Chloride 92 (*)    Glucose, Bld 113 (*)    Calcium 8.6 (*)    All other components within normal limits  CBC WITH DIFFERENTIAL/PLATELET - Abnormal; Notable for the following components:   Monocytes Absolute 1.1 (*)    All other components within normal limits  MAGNESIUM    EKG EKG Interpretation  Date/Time:  Tuesday February 10 2022  16:37:59 EST Ventricular Rate:  65 PR Interval:  160 QRS Duration: 131 QT Interval:  420 QTC Calculation: 437 R Axis:   -72 Text Interpretation: Sinus rhythm RBBB and LAFB No significant change since last tracing Confirmed by Davonna Belling [phone]) on 02/10/2022 4:43:47 PM  Radiology CT HEAD WO CONTRAST (5MM)  Result Date: 02/10/2022 CLINICAL DATA:  Neuro deficit, acute, stroke suspected EXAM: CT HEAD WITHOUT CONTRAST TECHNIQUE: Contiguous axial images were obtained from the base of the skull through the vertex without intravenous contrast. RADIATION DOSE REDUCTION: This exam was  performed according to the departmental dose-optimization program which includes automated exposure control, adjustment of the mA and/or kV according to patient size and/or use of iterative reconstruction technique. COMPARISON:  Head CT 11/26/2021 FINDINGS: Brain: No intracranial hemorrhage, mass effect, or midline shift. Brain volume is normal for age. No hydrocephalus. The basilar cisterns are patent. Minimal basal gangliar mineralization is likely senescent. Mild periventricular and deep chronic small vessel ischemic change. No evidence of territorial infarct or acute ischemia. No extra-axial or intracranial fluid collection. Vascular: No hyperdense vessel or unexpected calcification. Skull: No fracture or focal lesion. Sinuses/Orbits: Chronic sphenoid sinusitis with cortical thickening and fluid. No acute findings. No mastoid effusion. Bilateral cataract resection. Other: None. IMPRESSION: 1. No acute intracranial abnormality. 2. Mild chronic small vessel ischemic change. 3. Chronic sphenoid sinusitis. Electronically Signed   By: Keith Rake M.D.   On: 02/10/2022 18:19    Procedures Procedures    Medications Ordered in ED Medications  acetaminophen (TYLENOL) tablet 1,000 mg (1,000 mg Oral Given 02/10/22 2147)  metoprolol tartrate (LOPRESSOR) tablet 12.5 mg (12.5 mg Oral Given 02/10/22 2146)  brexpiprazole  (REXULTI) tablet 0.25 mg (has no administration in time range)  levothyroxine (SYNTHROID) tablet 150 mcg (has no administration in time range)  docusate sodium (COLACE) capsule 100 mg (100 mg Oral Given 02/10/22 2146)  apixaban (ELIQUIS) tablet 5 mg (5 mg Oral Given 02/10/22 2147)  sodium chloride flush (NS) 0.9 % injection 3 mL (3 mLs Intravenous Given 02/10/22 2148)  sodium chloride flush (NS) 0.9 % injection 3 mL (has no administration in time range)  0.9 %  sodium chloride infusion (has no administration in time range)  HYDROcodone-acetaminophen (NORCO/VICODIN) 5-325 MG per tablet 1-2 tablet (1 tablet Oral Given 02/10/22 2146)  fentaNYL (SUBLIMAZE) injection 12.5-50 mcg (has no administration in time range)  traZODone (DESYREL) tablet 25 mg (has no administration in time range)  polyethylene glycol (MIRALAX / GLYCOLAX) packet 17 g (has no administration in time range)  ondansetron (ZOFRAN) tablet 4 mg (has no administration in time range)    Or  ondansetron (ZOFRAN) injection 4 mg (has no administration in time range)  metoprolol tartrate (LOPRESSOR) injection 5 mg (has no administration in time range)  sertraline (ZOLOFT) tablet 100 mg (100 mg Oral Given 02/10/22 2146)  acidophilus (RISAQUAD) capsule 1 capsule (has no administration in time range)  calcium-vitamin D (OSCAL WITH D) 500-5 MG-MCG per tablet 1 tablet (has no administration in time range)  LORazepam (ATIVAN) injection 0.5 mg (0.5 mg Intravenous Given 02/10/22 1907)    ED Course/ Medical Decision Making/ A&P                             Medical Decision Making Amount and/or Complexity of Data Reviewed Labs: ordered. Radiology: ordered.  Risk Prescription drug management. Decision regarding hospitalization.   Patient with reported spasms of both arms and legs.  Generalized weakness over the last weeks.  States will spasm up and she cannot move anything.  Sounds like she is also anxious.  Episodes come and go.  May be  anxiety component but also check electrolyte abnormality basic blood work.  Will talk with family member when daughter comes back.  With bilateral and upper lower extremity symptoms doubt abnormality such as stroke and seizure.  Doubt significant enough to cause symptoms.  Patient's daughter now here.  States has chronic pain in the legs and increasing difficulty using them/weakness.  Cannot ambulate anymore.  With  increasing weakness I think patient benefit from admission to the hospital for further workup.  Will discuss with hospitalist.        Final Clinical Impression(s) / ED Diagnoses Final diagnoses:  Muscle spasm  Pain in both lower extremities    Rx / DC Orders ED Discharge Orders     None         Davonna Belling, MD 02/10/22 2305

## 2022-02-10 NOTE — Assessment & Plan Note (Signed)
Patient with recent change in status from ambulatory to inability to move her legs.  It is unclear exactly what is going on. PT and OT to eval and will follow recommendations Consider neurology consult MRI of the lumbar spine

## 2022-02-10 NOTE — ED Notes (Signed)
Pt is requesting a test for "Stiff body syndrome."  Will make EDP aware.   Pt reports these episodes started after a fall last November.  Sts she was supposed to have an appointment w/ Neurology tomorrow.

## 2022-02-10 NOTE — ED Notes (Signed)
ED TO INPATIENT HANDOFF REPORT  Name/Age/Gender Carmen Haney 89 y.o. female  Code Status Code Status History     Date Active Date Inactive Code Status Order ID Comments User Context   11/26/2021 2233 12/01/2021 2008 Full Code [id number]  Rhetta Mura DO Inpatient   11/26/2021 1712 11/26/2021 2233 Full Code [id number]  Carmen Bongo, MD ED   02/16/2021 0307 02/16/2021 1823 Full Code [id number]  Renae Fickle, MD Inpatient       Home/SNF/Other Home  Chief Complaint Lower extremity weakness [R29.898]  Level of Care/Admitting Diagnosis ED Disposition     ED Disposition  Admit   Condition  --   Comment  Hospital Area: [facility] [[id number]]  Level of Care: Med-Surg [16]  May place patient in observation at [facility] or [facility] if equivalent level of care is available:: Yes  Covid Evaluation: Asymptomatic - no recent exposure (last 10 days) testing not required  Diagnosis: Lower extremity weakness [428768]  Admitting Physician: Merrily Pew  Attending Physician: Donnamae Jude [[id number]]          Medical History Past Medical History:  Diagnosis Date   ACE-inhibitor cough    Breast CA ([place]) 2010   s/p lumpectomy & radiation   CAD (coronary artery disease) 07/1998   s/p cath in 2000. L main with 30 to 40%   Chronic diastolic heart failure (HCC)    Hearing loss    HTN (hypertension)    Hyperlipidemia    Obesities, morbid (HCC)    Osteoporosis    Paroxysmal atrial fibrillation (HCC)    Personal history of radiation therapy 2010   Right Breast Cancer   Thyroid disease    Vertebral fracture, pathological     Allergies Allergies  Allergen Reactions   Ace Inhibitors Cough   Latex Rash    IV Location/Drains/Wounds Patient Lines/Drains/Airways Status     Active Line/Drains/Airways     Name Placement date Placement time Site Days   Peripheral IV 02/10/22 20 G Left Antecubital 02/10/22  1651  Antecubital  less than 1             Labs/Imaging Results for orders placed or performed during the hospital encounter of 02/10/22 (from the past 48 hour(s))  Comprehensive metabolic panel     Status: Abnormal   Collection Time: 02/10/22  4:53 PM  Result Value Ref Range   Sodium 131 (L) 135 - 145 mmol/L   Potassium 4.3 3.5 - 5.1 mmol/L   Chloride 92 (L) 98 - 111 mmol/L   CO2 28 22 - 32 mmol/L   Glucose, Bld 113 (H) 70 - 99 mg/dL    Comment: Glucose reference range applies only to samples taken after fasting for at least 8 hours.   BUN 20 8 - 23 mg/dL   Creatinine, Ser 0.83 0.44 - 1.00 mg/dL   Calcium 8.6 (L) 8.9 - 10.3 mg/dL   Total Protein 7.7 6.5 - 8.1 g/dL   Albumin 3.7 3.5 - 5.0 g/dL   AST 26 15 - 41 U/L   ALT 14 0 - 44 U/L   Alkaline Phosphatase 73 38 - 126 U/L   Total Bilirubin 0.7 0.3 - 1.2 mg/dL   GFR, Estimated >60 >60 mL/min    Comment: (NOTE) Calculated using the CKD-EPI Creatinine Equation (2021)    Anion gap 11 5 - 15    Comment: Performed at [facility], [place] [address], [place], [place] [address]  Magnesium  Status: None   Collection Time: 02/10/22  4:53 PM  Result Value Ref Range   Magnesium 2.4 1.7 - 2.4 mg/dL    Comment: Performed at [facility], [place] [address], [place], [place] [address]  CBC with Differential     Status: Abnormal   Collection Time: 02/10/22  4:53 PM  Result Value Ref Range   WBC 9.2 4.0 - 10.5 K/uL   RBC 4.49 3.87 - 5.11 MIL/uL   Hemoglobin 13.6 12.0 - 15.0 g/dL   HCT 41.4 36.0 - 46.0 %   MCV 92.2 80.0 - 100.0 fL   MCH 30.3 26.0 - 34.0 pg   MCHC 32.9 30.0 - 36.0 g/dL   RDW 14.5 11.5 - 15.5 %   Platelets 244 150 - 400 K/uL   nRBC 0.0 0.0 - 0.2 %   Neutrophils Relative % 74 %   Neutro Abs 6.6 1.7 - 7.7 K/uL   Lymphocytes Relative 13 %   Lymphs Abs 1.2 0.7 - 4.0 K/uL   Monocytes Relative 12 %   Monocytes Absolute 1.1 (H) 0.1 - 1.0 K/uL   Eosinophils Relative 1 %   Eosinophils Absolute 0.1 0.0 - 0.5 K/uL    Basophils Relative 0 %   Basophils Absolute 0.0 0.0 - 0.1 K/uL   Immature Granulocytes 0 %   Abs Immature Granulocytes 0.04 0.00 - 0.07 K/uL    Comment: Performed at [facility], [place] [address], [place],  [address]   CT HEAD WO CONTRAST (5MM)  Result Date: 02/10/2022 CLINICAL DATA:  Neuro deficit, acute, stroke suspected EXAM: CT HEAD WITHOUT CONTRAST TECHNIQUE: Contiguous axial images were obtained from the base of the skull through the vertex without intravenous contrast. RADIATION DOSE REDUCTION: This exam was performed according to the departmental dose-optimization program which includes automated exposure control, adjustment of the mA and/or kV according to patient size and/or use of iterative reconstruction technique. COMPARISON:  Head CT 11/26/2021 FINDINGS: Brain: No intracranial hemorrhage, mass effect, or midline shift. Brain volume is normal for age. No hydrocephalus. The basilar cisterns are patent. Minimal basal gangliar mineralization is likely senescent. Mild periventricular and deep chronic small vessel ischemic change. No evidence of territorial infarct or acute ischemia. No extra-axial or intracranial fluid collection. Vascular: No hyperdense vessel or unexpected calcification. Skull: No fracture or focal lesion. Sinuses/Orbits: Chronic sphenoid sinusitis with cortical thickening and fluid. No acute findings. No mastoid effusion. Bilateral cataract resection. Other: None. IMPRESSION: 1. No acute intracranial abnormality. 2. Mild chronic small vessel ischemic change. 3. Chronic sphenoid sinusitis. Electronically Signed   By: Keith Rake M.D.   On: 02/10/2022 18:19    Pending Labs Unresulted Labs (From admission, onward)    None       Vitals/Pain Today's Vitals   02/10/22 1845 02/10/22 1900 02/10/22 1902 02/10/22 1930  BP:      Pulse: 76 67 69 72  Resp: 18 (!) 23 (!) 22 (!) 27  Temp:      TempSrc:      SpO2: 95% 97% 96% 94%  PainSc:         Isolation Precautions No active isolations  Medications Medications  LORazepam (ATIVAN) injection 0.5 mg (0.5 mg Intravenous Given 02/10/22 1907)    Mobility non-ambulatory, pt currently not tolerating any movement d/t pain

## 2022-02-10 NOTE — Progress Notes (Deleted)
Initial neurology clinic note  SERVICE DATE: 02/11/22  Reason for Evaluation: Consultation requested by Carmen Alanis, NP for an opinion regarding weakness and numbness of legs, unsteady. My final recommendations will be communicated back to the requesting physician by way of shared medical record or letter to requesting physician via [place] mail.  HPI: This is [name], a 89 y.o. ***-handed female with a medical history of HTN, pAfib (on eliquis), CAD, hypothyroidism, osteoporosis, hearing loss, breast cancer (2010), OA, depression, and anxiety*** who presents to neurology clinic with the chief complaint of weakness and numbness of legs, unsteady. The patient is accompanied by ***.  ***  Has weakness - difficulty sitting up in bed/chair, rolling over, and ambulating with a walker since beginning of 01/2022 Intermittent numbness to lower extremities, feels like muscles tense up  [name]) called patient's geriatrician on 02/10/22 stating that patient could not get up on 02/09/22 and 911 was called. She is weak in the legs and cannot walk. Daughter was recommended to take patient to ED.  On lasix for LE edema*** On zoloft and rexulti for depression*** On tramadol for ***  The patient has not*** had similar episodes of symptoms in the past. ***  Muscle bulk loss? *** Muscle pain? ***  Cramps/Twitching? *** Suggestion of myotonia/difficulty relaxing after contraction? ***  Fatigable weakness?*** Does strength improve after brief exercise?***  Able to brush hair/teeth without difficulty? *** Able to button shirts/use zips? *** Clumsiness/dropping grasped objects?*** Can you arise from squatted position easily? *** Able to get out of chair without using arms? *** Able to walk up steps easily? *** Use an assistive device to walk? *** Significant imbalance with walking? *** Falls?*** Any change in urine color, especially after exertion/physical activity? ***  The patient  denies*** symptoms suggestive of oculobulbar weakness including diplopia, ptosis, dysphagia, poor saliva control, dysarthria/dysphonia, impaired mastication, facial weakness/droop.  There are no*** neuromuscular respiratory weakness symptoms, particularly orthopnea>dyspnea.   Pseudobulbar affect is absent***.  The patient does not*** report symptoms referable to autonomic dysfunction including impaired sweating, heat or cold intolerance, excessive mucosal dryness, gastroparetic early satiety, postprandial abdominal bloating, constipation, bowel or bladder dyscontrol, erectile dysfunction*** or syncope/presyncope/orthostatic intolerance.  There are no*** complaints relating to other symptoms of small fiber modalities including paresthesia/pain.  The patient has not *** noticed any recent skin rashes nor does he*** report any constitutional symptoms like fever, night sweats, anorexia or unintentional weight loss.  EtOH use: ***  Restrictive diet? *** Family history of neuropathy/myopathy/NM disease?***  Previous labs, electrodiagnostics, and neuroimaging are summarized below, but pertinent findings include***  Any biopsy done? *** Current medications being tried for the patient's symptoms include ***  Prior medications that have been tried: ***   MEDICATIONS:  Outpatient Encounter Medications as of 02/11/2022  Medication Sig Note   acetaminophen (TYLENOL) 500 MG tablet Take 1,000 mg by mouth every 8 (eight) hours as needed for mild pain or headache.    apixaban (ELIQUIS) 5 MG TABS tablet Take 1 tablet (5 mg total) by mouth 2 (two) times daily. 11/26/2021: Morning and Evening   brexpiprazole (REXULTI) 0.25 MG TABS tablet Take 1 tablet (0.25 mg total) by mouth daily.    Calcium-Phosphorus-Vitamin D (CALCIUM/D3 ADULT GUMMIES PO) Take 2 tablets by mouth in the morning.    docusate sodium (COLACE) 100 MG capsule Take 1 capsule (100 mg total) by mouth 2 (two) times daily.    feeding supplement  (ENSURE ENLIVE / ENSURE PLUS) LIQD Take 237  mLs by mouth daily. (Patient not taking: Reported on 01/30/2022)    furosemide (LASIX) 20 MG tablet TAKE ONE TABLET BY MOUTH DAILY (Patient not taking: Reported on 01/30/2022)    levothyroxine (SYNTHROID) 150 MCG tablet Take 150 mcg by mouth daily before breakfast.    MAGNESIUM PO Take 20 mLs by mouth at bedtime. 11/26/2021: Strength is unconfirmed   metoprolol tartrate (LOPRESSOR) 25 MG tablet Take 0.5 tablets (12.5 mg total) by mouth 2 (two) times daily.    Polyethyl Glycol-Propyl Glycol (SYSTANE OP) Place 2-3 drops into both eyes at bedtime. (Patient not taking: Reported on 02/05/2022)    Probiotic Product (PROBIOTIC GUMMIES PO) Take 2 tablets by mouth in the morning.    sertraline (ZOLOFT) 100 MG tablet Take 200 mg by mouth at bedtime.    traMADol (ULTRAM) 50 MG tablet Take 1 tablet (50 mg total) by mouth every 6 (six) hours as needed for moderate pain. (Patient not taking: Reported on 01/30/2022)    [DISCONTINUED] olmesartan (BENICAR) 20 MG tablet Take 20 mg by mouth daily.      No facility-administered encounter medications on file as of 02/11/2022.    PAST MEDICAL HISTORY: Past Medical History:  Diagnosis Date   ACE-inhibitor cough    Breast CA ([place]) 2010   s/p lumpectomy & radiation   CAD (coronary artery disease) 07/1998   s/p cath in 2000. L main with 30 to 40%   Chronic diastolic heart failure (HCC)    Hearing loss    HTN (hypertension)    Hyperlipidemia    Obesities, morbid (HCC)    Osteoporosis    Paroxysmal atrial fibrillation (HCC)    Personal history of radiation therapy 2010   Right Breast Cancer   Thyroid disease    Vertebral fracture, pathological     PAST SURGICAL HISTORY: Past Surgical History:  Procedure Laterality Date   BREAST BIOPSY Left    BREAST LUMPECTOMY Right March 2010   BREAST LUMPECTOMY WITH RADIOACTIVE SEED LOCALIZATION Left 09/20/2014   Procedure: LEFT BREAST LUMPECTOMY WITH RADIOACTIVE SEED LOCALIZATION;   Surgeon: Autumn Messing III, MD;  Location: [facility];  Service: General;  Laterality: Left;   CARPAL TUNNEL RELEASE Right    CESAREAN SECTION     EYE SURGERY     Right Eye   Skin Cancer Removed from Right Forearm     TOTAL ABDOMINAL HYSTERECTOMY      ALLERGIES: Allergies  Allergen Reactions   Ace Inhibitors Cough   Latex Rash    FAMILY HISTORY: Family History  Problem Relation Age of Onset   Coronary artery disease Mother    Hypertension Mother    Melanoma Mother    Cancer Mother 61       breast   Breast cancer Mother    Cancer Father 9       prostate   Cancer Sister 47       breast   Breast cancer Sister    Cancer Maternal Aunt 43       breast   Cancer Sister 27       breast   Breast cancer Sister    Breast cancer Daughter 45    SOCIAL HISTORY: Social History   Tobacco Use   Smoking status: Former    Types: Cigarettes    Quit date: 01/13/1963    Years since quitting: 59.1   Smokeless tobacco: Never   Tobacco comments:    stopped in 1970's  Vaping Use   Vaping Use: Never  used  Substance Use Topics   Alcohol use: No    Alcohol/week: 0.0 standard drinks of alcohol   Drug use: Never   Social History   Social History Narrative   Not on file     OBJECTIVE: PHYSICAL EXAM: There were no vitals taken for this visit.  General:*** General appearance: Awake and alert. No distress. Cooperative with exam.  Skin: No obvious rash or jaundice. HEENT: Atraumatic. Anicteric. Lungs: Non-labored breathing on room air  Heart: Regular Abdomen: Soft, non tender. Extremities: No edema. No obvious deformity.  Musculoskeletal: No obvious joint swelling. Psych: Affect appropriate.  Neurological: Mental Status: Alert. Speech fluent. No pseudobulbar affect Cranial Nerves: CNII: No RAPD. Visual fields grossly intact. CNIII, IV, VI: PERRL. No nystagmus. EOMI. CN V: Facial sensation intact bilaterally to fine touch. [place] clench strong. Jaw  jerk***. CN VII: Facial muscles symmetric and strong. No ptosis at rest or after sustained upgaze***. CN VIII: Hearing grossly intact bilaterally. CN IX: No hypophonia. CN X: Palate elevates symmetrically. CN XI: Full strength shoulder shrug bilaterally. CN XII: Tongue protrusion full and midline. No atrophy or fasciculations. No significant dysarthria*** Motor: Tone is ***. *** fasciculations in *** extremities. *** atrophy. No grip or percussive myotonia.***  Individual muscle group testing (MRC grade out of 5):  Movement     Neck flexion ***    Neck extension ***     Right Left   Shoulder abduction *** ***   Shoulder adduction *** ***   Shoulder ext rotation *** ***   Shoulder int rotation *** ***   Elbow flexion *** ***   Elbow extension *** ***   Wrist extension *** ***   Wrist flexion *** ***   Finger abduction - FDI *** ***   Finger abduction - ADM *** ***   Finger extension *** ***   Finger distal flexion - 2/3 *** ***   Finger distal flexion - 4/5 *** ***   Thumb flexion - FPL *** ***   Thumb abduction - APB *** ***    Hip flexion *** ***   Hip extension *** ***   Hip adduction *** ***   Hip abduction *** ***   Knee extension *** ***   Knee flexion *** ***   Dorsiflexion *** ***   Plantarflexion *** ***   Inversion *** ***   Eversion *** ***   Great toe extension *** ***   Great toe flexion *** ***     Reflexes:  Right Left   Bicep *** ***   Tricep *** ***   BrRad *** ***   Knee *** ***   Ankle *** ***    Pathological Reflexes: Babinski: *** response bilaterally*** Hoffman: *** Troemner: *** Pectoral: *** Palmomental: *** Facial: *** Midline tap: *** Sensation: Pinprick: *** Vibration: *** Temperature: *** Proprioception: *** Coordination: Intact finger-to- nose-finger bilaterally. Romberg negative.*** Gait: Able to rise from chair with arms crossed unassisted. Normal, narrow-based gait. Able to tandem walk. Able to walk on toes and  heels.***  Lab and Test Review: Internal labs: 11/2021: Normal or unremarkable: CBC, BMP, ESR, vit D, TSH CMP significant for albumin of 3.1 ***  External labs: ***  Imaging: CT lumbar spine wo contrast (11/26/21): FINDINGS: Segmentation: There are five lumbar type vertebral bodies. The last full intervertebral disc space is labeled L5-S1.   Alignment: Normal alignment of the lumbar vertebral bodies. The facets are also normally aligned.   Vertebrae: Diffuse and fairly marked osteoporosis. Mild superior endplate depression of L3 is  stable since the prior radiographs. No acute lumbar spine compression fracture. There is a compression deformity of T12. I do not see this for certain on the prior lumbar spine plain films. This is likely an acute compression fracture. No retropulsion or canal compromise.   Paraspinal and other soft tissues: No significant paraspinal findings. I do not see an obvious paraspinal hematoma at T12.   Disc levels:   T12-L1: No significant findings.   L1-2: Diffuse annular bulge and mild facet disease contributing to mild bilateral lateral recess stenosis.   L2-3: Moderate facet disease and bulging annulus with mild spinal and bilateral lateral recess stenosis.   L3-4: Diffuse bulging annulus, facet disease and ligamentum flavum thickening contributing to mild to moderate spinal and bilateral lateral recess stenosis.   L4-5: Diffuse bulging degenerated annulus, facet disease and ligamentum flavum thickening contributing to moderately severe spinal and bilateral lateral recess stenosis. No significant foraminal stenosis.   L5-S1: Bulging degenerated annulus and severe facet disease with mild spinal and bilateral lateral recess stenosis. No foraminal stenosis.   IMPRESSION: 1. Acute compression fracture of T12 without retropulsion or canal compromise. 2. Diffuse and fairly marked osteoporosis. 3. Multilevel spinal and lateral recess  stenosis as discussed above.  CT head and cervical spine wo contrast (11/26/21): FINDINGS: CT HEAD FINDINGS   Brain: No evidence of acute infarction, hemorrhage, hydrocephalus, extra-axial collection or mass lesion/mass effect.   Vascular: Calcific atherosclerosis.   Skull: No acute fracture.   Sinuses/Orbits: Clear sinuses.  No acute orbital findings.   Other: No mastoid effusions.   CT CERVICAL SPINE FINDINGS   Alignment: No substantial sagittal subluxation.   Skull base and vertebrae: No evidence of acute fracture.   Soft tissues and spinal canal: No prevertebral fluid or swelling. No visible canal hematoma.   Disc levels:  Moderate multilevel degenerative change.   Upper chest: Visualized lung apices are clear.   IMPRESSION: 1. No evidence of acute intracranial abnormality. 2. No evidence of acute fracture or traumatic malalignment in the cervical spine. ***  ASSESSMENT: FALLON BONADIO is a 89 y.o. female who presents for evaluation of ***. *** has a relevant medical history of ***. *** neurological examination is pertinent for ***. Available diagnostic data is significant for ***. This constellation of symptoms and objective data would most likely localize to ***. ***  PLAN: -Blood work: ***B1, B12 ***  -Return to clinic ***  The impression above as well as the plan as outlined below were extensively discussed with the patient (in the company of ***) who voiced understanding. All questions were answered to their satisfaction.  The patient was counseled on pertinent fall precautions per the printed material provided today, and as noted under the "Patient Instructions" section below.***  When available, results of the above investigations and possible further recommendations will be communicated to the patient via telephone/MyChart. Patient to call office if not contacted after expected testing turnaround time.   Total time spent reviewing records, interview,  history/exam, documentation, and coordination of care on day of encounter:  *** min   Thank you for allowing me to participate in patient's care.  If I can answer any additional questions, I would be pleased to do so.  Kai Levins, MD   CC: Carmen Alanis, NP [phone] [place] [address]  CC: Referring provider: [name], NP [facility] [address] [place],  [place] [address]

## 2022-02-10 NOTE — ED Triage Notes (Signed)
Pt bib [facility] from home d/t whole body spasms. Pt reports a fall in November and had muscle spasms and weakness to extremity. Over the last few weeks spasms have moved over entire body accompanied by weakness.

## 2022-02-10 NOTE — Hospital Course (Signed)
Patient is a 89 year old with history of paroxysmal A-fib, vertebral fracture, right breast cancer, CAD, CHF PEF, HTN, HLD, hypothyroid, hard of hearing who is admitted in November of this year with increasing weakness and fatigue and following a fall at home.  Following that hospitalization she went to inpatient rehab and had been home with outpatient PT and walking independently with a walker up until about 2 weeks ago.  She reports decreasing mobility in her legs and increasing reliance on her upper body to move her.  She was getting around with a walker at home but now her family has purchased.  Sit to stand and is using that.  She also has more recent onset of incontinence as well.  The patient reports that she cannot feel her legs and cannot really lift them.  She also has ongoing spasms in her legs and pain that seems to be constant.  She does take Tylenol without any relief.  In the ED she was noted to have normal labs and a negative head CT we were asked to admit for her inability to walk and continued workup.

## 2022-02-10 NOTE — Assessment & Plan Note (Signed)
Currently in sinus rhythm On Eliquis

## 2022-02-10 NOTE — Assessment & Plan Note (Signed)
Has not been on statin due to weakness Continue metoprolol

## 2022-02-10 NOTE — H&P (Signed)
History and Physical    Patient: Carmen Haney UXN:[id number] DOB: 1930/06/27 DOA: 02/10/2022 DOS: the patient was seen and examined on 02/10/2022 PCP: [name], NP  Patient coming from: Home  Chief Complaint:  Chief Complaint  Patient presents with   Spasms   HPI: Carmen Haney is a 89 y.o. female with medical history significant of paroxysmal A-fib, vertebral fracture, right breast cancer, CAD, CHF PEF, HTN, HLD, hypothyroid, hard of hearing who is admitted in November of this year with increasing weakness and fatigue and following a fall at home.  Following that hospitalization she went to inpatient rehab and had been home with outpatient PT and walking independently with a walker up until about 2 weeks ago.  She reports decreasing mobility in her legs and increasing reliance on her upper body to move her.  She was getting around with a walker at home but now her family has purchased.  Sit to stand and is using that.  She also has more recent onset of incontinence as well.  The patient reports that she cannot feel her legs and cannot really lift them.  She also has ongoing spasms in her legs and pain that seems to be constant.  She does take Tylenol without any relief.  In the ED she was noted to have normal labs and a negative head CT we were asked to admit for her inability to walk and continued workup.  Review of Systems: As mentioned in the history of present illness. All other systems reviewed and are negative. Past Medical History:  Diagnosis Date   ACE-inhibitor cough    Breast CA ([place]) 2010   s/p lumpectomy & radiation   CAD (coronary artery disease) 07/1998   s/p cath in 2000. L main with 30 to 40%   Chronic diastolic heart failure (HCC)    Hearing loss    HTN (hypertension)    Hyperlipidemia    Obesities, morbid (HCC)    Osteoporosis    Paroxysmal atrial fibrillation (HCC)    Personal history of radiation therapy 2010   Right Breast Cancer   Thyroid disease     Vertebral fracture, pathological    Past Surgical History:  Procedure Laterality Date   BREAST BIOPSY Left    BREAST LUMPECTOMY Right March 2010   BREAST LUMPECTOMY WITH RADIOACTIVE SEED LOCALIZATION Left 09/20/2014   Procedure: LEFT BREAST LUMPECTOMY WITH RADIOACTIVE SEED LOCALIZATION;  Surgeon: Autumn Messing III, MD;  Location: [facility];  Service: General;  Laterality: Left;   CARPAL TUNNEL RELEASE Right    CESAREAN SECTION     EYE SURGERY     Right Eye   Skin Cancer Removed from Right Forearm     TOTAL ABDOMINAL HYSTERECTOMY     Social History:  reports that she quit smoking about 59 years ago. Her smoking use included cigarettes. She has never used smokeless tobacco. She reports that she does not drink alcohol and does not use drugs.  Allergies  Allergen Reactions   Ace Inhibitors Cough   Latex Rash    Family History  Problem Relation Age of Onset   Coronary artery disease Mother    Hypertension Mother    Melanoma Mother    Cancer Mother 44       breast   Breast cancer Mother    Cancer Father 49       prostate   Cancer Sister 21       breast   Breast cancer Sister  Cancer Maternal Aunt 25       breast   Cancer Sister 54       breast   Breast cancer Sister    Breast cancer Daughter 56    Prior to Admission medications   Medication Sig Start Date End Date Taking? Authorizing Provider  acetaminophen (TYLENOL) 500 MG tablet Take 1,000 mg by mouth in the morning and at bedtime.   Yes [provider]  apixaban (ELIQUIS) 5 MG TABS tablet Take 1 tablet (5 mg total) by mouth 2 (two) times daily. Patient taking differently: Take 5 mg by mouth in the morning and at bedtime. 02/16/21  Yes Duke, Tami Lin, PA  brexpiprazole (REXULTI) 0.25 MG TABS tablet Take 1 tablet (0.25 mg total) by mouth daily. 02/05/22  Yes Fargo, Amy E, NP  Calcium-Phosphorus-Vitamin D (CALCIUM/D3 ADULT GUMMIES PO) Take 500 mg by mouth in the morning.   Yes [provider]  docusate sodium (COLACE) 100 MG capsule Take 1 capsule (100 mg total) by mouth 2 (two) times daily. Patient taking differently: Take 100 mg by mouth at bedtime. 12/01/21  Yes Lavina Hamman, MD  DULoxetine (CYMBALTA) 20 MG capsule Take 20 mg by mouth See admin instructions. Starting on 02/10/2022, take 20 mg by mouth once a day for 7 days 02/10/22 02/17/22 Yes [provider]  levothyroxine (SYNTHROID) 150 MCG tablet Take 150 mcg by mouth daily before breakfast. 04/07/21  Yes [provider]  LORazepam (ATIVAN) 0.5 MG tablet Take 0.5 mg by mouth 2 (two) times daily as needed for anxiety. 02/09/22  Yes [provider]  MAGNESIUM PO Take 20 mLs by mouth at bedtime.   Yes [provider]  metoprolol tartrate (LOPRESSOR) 25 MG tablet Take 0.5 tablets (12.5 mg total) by mouth 2 (two) times daily. Patient taking differently: Take 12.5 mg by mouth See admin instructions. Take 12.5 mg by mouth in the morning and evening 02/16/21  Yes Duke, Tami Lin, PA  Probiotic Product (PROBIOTIC GUMMIES PO) Take 2 tablets by mouth in the morning.   Yes [provider]  sertraline (ZOLOFT) 100 MG tablet Take 100 mg by mouth at bedtime. 02/10/22 02/18/22 Yes [provider]  DULoxetine (CYMBALTA) 30 MG capsule Take 30 mg by mouth See admin instructions. Starting on 02/18/2022, take 30 mg by mouth once a day for 7 days Patient not taking: Reported on 02/10/2022 02/18/22 02/25/22  [provider]  DULoxetine HCl 40 MG CPEP Take 40 mg by mouth See admin instructions. Starting on 02/19/2022, take 40 mg by mouth once a day Patient not taking: Reported on 02/10/2022 02/26/22   [provider]  feeding supplement (ENSURE ENLIVE / ENSURE PLUS) LIQD Take 237 mLs by mouth daily. Patient not taking: Reported on 02/10/2022 12/01/21   Lavina Hamman, MD  furosemide (LASIX) 20 MG tablet TAKE ONE TABLET BY MOUTH DAILY Patient not taking: Reported on 01/30/2022 01/06/22    Lelon Perla, MD  traMADol (ULTRAM) 50 MG tablet Take 1 tablet (50 mg total) by mouth every 6 (six) hours as needed for moderate pain. Patient not taking: Reported on 02/10/2022 12/01/21 12/01/22  Lavina Hamman, MD  olmesartan (BENICAR) 20 MG tablet Take 20 mg by mouth daily.    04/25/10  [provider]    Physical Exam: Vitals:   02/10/22 1902 02/10/22 1930 02/10/22 1934 02/10/22 2028  BP:   120/65 138/66  Pulse: 69 72 72 72  Resp: (!) 22 (!) 27 Marland Kitchen)  23 14  Temp:   97.9 F (36.6 C) 100.1 F (37.8 C)  TempSrc:   Oral Oral  SpO2: 96% 94% 96% 96%  Weight:    92.9 kg   Physical Examination: General appearance - alert, well appearing, and in no distress Chest - clear to auscultation, no wheezes, rales or rhonchi, symmetric air entry Heart - normal rate, regular rhythm, normal S1, S2, no murmurs, rubs, clicks or gallops Abdomen - soft, nontender, nondistended, no masses or organomegaly Extremities -cool, erythema of the feet, is able to move her feet as well as lift her legs somewhat off the bed.  Data Reviewed: Results for orders placed or performed during the hospital encounter of 02/10/22 (from the past 24 hour(s))  Comprehensive metabolic panel     Status: Abnormal   Collection Time: 02/10/22  4:53 PM  Result Value Ref Range   Sodium 131 (L) 135 - 145 mmol/L   Potassium 4.3 3.5 - 5.1 mmol/L   Chloride 92 (L) 98 - 111 mmol/L   CO2 28 22 - 32 mmol/L   Glucose, Bld 113 (H) 70 - 99 mg/dL   BUN 20 8 - 23 mg/dL   Creatinine, Ser 0.83 0.44 - 1.00 mg/dL   Calcium 8.6 (L) 8.9 - 10.3 mg/dL   Total Protein 7.7 6.5 - 8.1 g/dL   Albumin 3.7 3.5 - 5.0 g/dL   AST 26 15 - 41 U/L   ALT 14 0 - 44 U/L   Alkaline Phosphatase 73 38 - 126 U/L   Total Bilirubin 0.7 0.3 - 1.2 mg/dL   GFR, Estimated >60 >60 mL/min   Anion gap 11 5 - 15  Magnesium     Status: None   Collection Time: 02/10/22  4:53 PM  Result Value Ref Range   Magnesium 2.4 1.7 - 2.4 mg/dL  CBC with Differential      Status: Abnormal   Collection Time: 02/10/22  4:53 PM  Result Value Ref Range   WBC 9.2 4.0 - 10.5 K/uL   RBC 4.49 3.87 - 5.11 MIL/uL   Hemoglobin 13.6 12.0 - 15.0 g/dL   HCT 41.4 36.0 - 46.0 %   MCV 92.2 80.0 - 100.0 fL   MCH 30.3 26.0 - 34.0 pg   MCHC 32.9 30.0 - 36.0 g/dL   RDW 14.5 11.5 - 15.5 %   Platelets 244 150 - 400 K/uL   nRBC 0.0 0.0 - 0.2 %   Neutrophils Relative % 74 %   Neutro Abs 6.6 1.7 - 7.7 K/uL   Lymphocytes Relative 13 %   Lymphs Abs 1.2 0.7 - 4.0 K/uL   Monocytes Relative 12 %   Monocytes Absolute 1.1 (H) 0.1 - 1.0 K/uL   Eosinophils Relative 1 %   Eosinophils Absolute 0.1 0.0 - 0.5 K/uL   Basophils Relative 0 %   Basophils Absolute 0.0 0.0 - 0.1 K/uL   Immature Granulocytes 0 %   Abs Immature Granulocytes 0.04 0.00 - 0.07 K/uL   CT HEAD WO CONTRAST (5MM)  Result Date: 02/10/2022 CLINICAL DATA:  Neuro deficit, acute, stroke suspected EXAM: CT HEAD WITHOUT CONTRAST TECHNIQUE: Contiguous axial images were obtained from the base of the skull through the vertex without intravenous contrast. RADIATION DOSE REDUCTION: This exam was performed according to the departmental dose-optimization program which includes automated exposure control, adjustment of the mA and/or kV according to patient size and/or use of iterative reconstruction technique. COMPARISON:  Head CT 11/26/2021 FINDINGS: Brain: No intracranial hemorrhage, mass effect, or  midline shift. Brain volume is normal for age. No hydrocephalus. The basilar cisterns are patent. Minimal basal gangliar mineralization is likely senescent. Mild periventricular and deep chronic small vessel ischemic change. No evidence of territorial infarct or acute ischemia. No extra-axial or intracranial fluid collection. Vascular: No hyperdense vessel or unexpected calcification. Skull: No fracture or focal lesion. Sinuses/Orbits: Chronic sphenoid sinusitis with cortical thickening and fluid. No acute findings. No mastoid effusion.  Bilateral cataract resection. Other: None. IMPRESSION: 1. No acute intracranial abnormality. 2. Mild chronic small vessel ischemic change. 3. Chronic sphenoid sinusitis. Electronically Signed   By: Keith Rake M.D.   On: 02/10/2022 18:19     Assessment and Plan: * Lower extremity weakness Patient with recent change in status from ambulatory to inability to move her legs.  It is unclear exactly what is going on. PT and OT to eval and will follow recommendations Consider neurology consult MRI of the lumbar spine  Atrial fibrillation (HCC) Currently in sinus rhythm On Eliquis  Osteoporosis On calcium plus vitamin D  Anxiety and depression Continue Zoloft, Rexulti, Cymbalta, Ativan as needed  Sensorineural hearing loss (SNHL), bilateral Needs hearing aids or for the person to speak loudly to her  Hypothyroidism (acquired) Continue Synthroid  Vertebral fracture, pathological Noted at T12 after her last fall Likely due to osteoporosis and wonder if this is contributing to her overall pain and weakness  Neoplasm of right breast, primary tumor staging category Tis: ductal carcinoma in situ (DCIS) Stage 0 in 2010 No evidence of recurrence  Hyperlipidemia Not on statin due to ongoing muscle weakness  HTN (hypertension) BP is mostly well-controlled on as needed metoprolol Lopressor IV as needed  CAD (coronary artery disease) Has not been on statin due to weakness Continue metoprolol      Advance Care Planning:   Code Status: Full Code reviewed with daughter at bedside  Consults: None  Family Communication: Daughter at bedside as well as daughter on the phone  Severity of Illness: The appropriate patient status for this patient is OBSERVATION. Observation status is judged to be reasonable and necessary in order to provide the required intensity of service to ensure the patient's safety. The patient's presenting symptoms, physical exam findings, and initial radiographic  and laboratory data in the context of their medical condition is felt to place them at decreased risk for further clinical deterioration. Furthermore, it is anticipated that the patient will be medically stable for discharge from the hospital within 2 midnights of admission.   Author: Donnamae Jude, MD 02/10/2022 9:56 PM  For on call review [email or web address].

## 2022-02-10 NOTE — Assessment & Plan Note (Signed)
Stage 0 in 2010 No evidence of recurrence

## 2022-02-10 NOTE — Assessment & Plan Note (Signed)
Not on statin due to ongoing muscle weakness

## 2022-02-10 NOTE — Assessment & Plan Note (Signed)
BP is mostly well-controlled on as needed metoprolol Lopressor IV as needed

## 2022-02-10 NOTE — Assessment & Plan Note (Signed)
Continue Synthroid °

## 2022-02-10 NOTE — Telephone Encounter (Signed)
Carmen Alanis, NP  [facility] (8:38 AM)   Urgent referral made. Since this change is abrupt. I recommend going to the ED for further evaluation.   Patient daughter notified and agreed.

## 2022-02-10 NOTE — Assessment & Plan Note (Signed)
On calcium plus vitamin D

## 2022-02-10 NOTE — Addendum Note (Signed)
Addended by: Windell Moulding E on: 02/10/2022 08:36 AM   Modules accepted: Orders

## 2022-02-10 NOTE — Assessment & Plan Note (Signed)
Continue Zoloft, Rexulti, Cymbalta, Ativan as needed

## 2022-02-11 ENCOUNTER — Observation Stay (HOSPITAL_COMMUNITY): Payer: Medicare PPO

## 2022-02-11 ENCOUNTER — Ambulatory Visit: Payer: Medicare PPO | Admitting: Neurology

## 2022-02-11 DIAGNOSIS — E039 Hypothyroidism, unspecified: Secondary | ICD-10-CM | POA: Diagnosis not present

## 2022-02-11 DIAGNOSIS — I2510 Atherosclerotic heart disease of native coronary artery without angina pectoris: Secondary | ICD-10-CM | POA: Diagnosis not present

## 2022-02-11 DIAGNOSIS — N200 Calculus of kidney: Secondary | ICD-10-CM | POA: Diagnosis not present

## 2022-02-11 DIAGNOSIS — E7800 Pure hypercholesterolemia, unspecified: Secondary | ICD-10-CM

## 2022-02-11 DIAGNOSIS — I10 Essential (primary) hypertension: Secondary | ICD-10-CM | POA: Diagnosis not present

## 2022-02-11 DIAGNOSIS — M48061 Spinal stenosis, lumbar region without neurogenic claudication: Secondary | ICD-10-CM | POA: Diagnosis not present

## 2022-02-11 DIAGNOSIS — M810 Age-related osteoporosis without current pathological fracture: Secondary | ICD-10-CM

## 2022-02-11 DIAGNOSIS — D0511 Intraductal carcinoma in situ of right breast: Secondary | ICD-10-CM

## 2022-02-11 DIAGNOSIS — F419 Anxiety disorder, unspecified: Secondary | ICD-10-CM

## 2022-02-11 DIAGNOSIS — M8448XS Pathological fracture, other site, sequela: Secondary | ICD-10-CM

## 2022-02-11 DIAGNOSIS — I2583 Coronary atherosclerosis due to lipid rich plaque: Secondary | ICD-10-CM

## 2022-02-11 DIAGNOSIS — H903 Sensorineural hearing loss, bilateral: Secondary | ICD-10-CM

## 2022-02-11 DIAGNOSIS — I480 Paroxysmal atrial fibrillation: Secondary | ICD-10-CM

## 2022-02-11 DIAGNOSIS — F32A Depression, unspecified: Secondary | ICD-10-CM

## 2022-02-11 DIAGNOSIS — R29898 Other symptoms and signs involving the musculoskeletal system: Secondary | ICD-10-CM | POA: Diagnosis not present

## 2022-02-11 LAB — CBC
HCT: 37.4 % (ref 36.0–46.0)
Hemoglobin: 12.2 g/dL (ref 12.0–15.0)
MCH: 30 pg (ref 26.0–34.0)
MCHC: 32.6 g/dL (ref 30.0–36.0)
MCV: 91.9 fL (ref 80.0–100.0)
Platelets: 218 10*3/uL (ref 150–400)
RBC: 4.07 MIL/uL (ref 3.87–5.11)
RDW: 14.6 % (ref 11.5–15.5)
WBC: 6.3 10*3/uL (ref 4.0–10.5)
nRBC: 0 % (ref 0.0–0.2)

## 2022-02-11 LAB — URINALYSIS, ROUTINE W REFLEX MICROSCOPIC
Bilirubin Urine: NEGATIVE
Glucose, UA: NEGATIVE mg/dL
Ketones, ur: NEGATIVE mg/dL
Leukocytes,Ua: NEGATIVE
Nitrite: NEGATIVE
Protein, ur: NEGATIVE mg/dL
Specific Gravity, Urine: 1.019 (ref 1.005–1.030)
pH: 5 (ref 5.0–8.0)

## 2022-02-11 LAB — RENAL FUNCTION PANEL
Albumin: 3.1 g/dL — ABNORMAL LOW (ref 3.5–5.0)
Anion gap: 9 (ref 5–15)
BUN: 16 mg/dL (ref 8–23)
CO2: 25 mmol/L (ref 22–32)
Calcium: 8 mg/dL — ABNORMAL LOW (ref 8.9–10.3)
Chloride: 94 mmol/L — ABNORMAL LOW (ref 98–111)
Creatinine, Ser: 0.75 mg/dL (ref 0.44–1.00)
GFR, Estimated: >60 mL/min (ref >60)
Glucose, Bld: 164 mg/dL — ABNORMAL HIGH (ref 70–99)
Phosphorus: 3.4 mg/dL (ref 2.5–4.6)
Potassium: 4.2 mmol/L (ref 3.5–5.1)
Sodium: 128 mmol/L — ABNORMAL LOW (ref 135–145)

## 2022-02-11 LAB — MAGNESIUM: Magnesium: 2.1 mg/dL (ref 1.7–2.4)

## 2022-02-11 LAB — C-REACTIVE PROTEIN: CRP: 16.3 mg/dL — ABNORMAL HIGH (ref <1.0)

## 2022-02-11 MED ORDER — APIXABAN 5 MG PO TABS
5.0000 mg | ORAL_TABLET | Freq: Two times a day (BID) | ORAL | Status: DC
  Administered 2022-02-11 – 2022-02-14 (×7): 5 mg via ORAL
  Filled 2022-02-11 (×7): qty 1

## 2022-02-11 MED ORDER — DULOXETINE HCL 20 MG PO CPEP
20.0000 mg | ORAL_CAPSULE | Freq: Every day | ORAL | Status: DC
  Administered 2022-02-11 – 2022-02-14 (×4): 20 mg via ORAL
  Filled 2022-02-11 (×4): qty 1

## 2022-02-11 MED ORDER — FENTANYL CITRATE PF 50 MCG/ML IJ SOSY: 25.0000 ug | PREFILLED_SYRINGE | INTRAMUSCULAR | Status: DC | PRN

## 2022-02-11 MED ORDER — TRAMADOL HCL 50 MG PO TABS
50.0000 mg | ORAL_TABLET | Freq: Three times a day (TID) | ORAL | Status: DC | PRN
  Administered 2022-02-11: 50 mg via ORAL
  Filled 2022-02-11: qty 1

## 2022-02-11 MED ORDER — SERTRALINE HCL 100 MG PO TABS
100.0000 mg | ORAL_TABLET | Freq: Every day | ORAL | Status: DC
  Administered 2022-02-11 – 2022-02-13 (×3): 100 mg via ORAL
  Filled 2022-02-11 (×3): qty 1

## 2022-02-11 MED ORDER — SERTRALINE HCL 50 MG PO TABS: 50.0000 mg | ORAL_TABLET | Freq: Every day | ORAL | Status: DC

## 2022-02-11 MED ORDER — SODIUM CHLORIDE (PF) 0.9 % IJ SOLN
INTRAMUSCULAR | Status: AC
  Filled 2022-02-11: qty 50

## 2022-02-11 MED ORDER — LORAZEPAM 0.5 MG PO TABS
0.5000 mg | ORAL_TABLET | Freq: Three times a day (TID) | ORAL | Status: DC | PRN
  Administered 2022-02-11 – 2022-02-12 (×2): 0.5 mg via ORAL
  Filled 2022-02-11 (×2): qty 1

## 2022-02-11 MED ORDER — ACETAMINOPHEN 325 MG PO TABS
650.0000 mg | ORAL_TABLET | Freq: Four times a day (QID) | ORAL | Status: DC
  Administered 2022-02-11 – 2022-02-12 (×3): 650 mg via ORAL
  Filled 2022-02-11 (×3): qty 2

## 2022-02-11 MED ORDER — IOHEXOL 300 MG/ML  SOLN
100.0000 mL | Freq: Once | INTRAMUSCULAR | Status: AC | PRN
  Administered 2022-02-11: 100 mL via INTRAVENOUS

## 2022-02-11 MED ORDER — OXYCODONE HCL 5 MG PO TABS: 5.0000 mg | ORAL_TABLET | Freq: Four times a day (QID) | ORAL | Status: DC | PRN

## 2022-02-11 MED ORDER — HYDROMORPHONE HCL 1 MG/ML IJ SOLN: 0.5000 mg | INTRAMUSCULAR | Status: DC | PRN

## 2022-02-11 MED ORDER — METHOCARBAMOL 500 MG PO TABS
500.0000 mg | ORAL_TABLET | Freq: Three times a day (TID) | ORAL | Status: DC
  Administered 2022-02-11 – 2022-02-12 (×6): 500 mg via ORAL
  Filled 2022-02-11 (×6): qty 1

## 2022-02-11 MED ORDER — BREXPIPRAZOLE 0.5 MG PO TABS
0.2500 mg | ORAL_TABLET | Freq: Every day | ORAL | Status: DC
  Administered 2022-02-11 – 2022-02-14 (×4): 0.25 mg via ORAL
  Filled 2022-02-11 (×2): qty 1

## 2022-02-11 NOTE — Evaluation (Addendum)
Physical Therapy Evaluation Patient Details Name: Carmen Haney MRN: [id number] DOB: 03-22-1930 Today's Date: 02/11/2022  History of Present Illness  Carmen Haney is a 89 y.o. female admitted with BLE weakness. MRI: no acute lumbar fracture; CT: "Right iliopsoas muscle is diffusely enlarged with ill-defined margins suggesting inflammation or edema or possibly injury. No definite hematoma is noted." PMH: paroxysmal A-fib, vertebral fracture, right breast cancer, CAD, CHF PEF, HTN, HLD, hypothyroid, hard of hearing   Clinical Impression  Pt admitted with above diagnosis. Pt from home with daughter who is 24/7 caregiver, has been declining mobility wise since November 2023, hasn't amb in ~3 weeks per daughter, using STEDY-like sit to stand transfer lift at home. Daughter reports pt with stiffening throughout entire body and high pain making mobility increasingly difficult. Pt needing max A to come to sitting EOB, tolerates sitting EOB for ~3 minutes then needing +2 assist to return to supine and scoot up in bed. Pt with poor sitting balance, needing assist to maintain static sitting, able to hold sitting posture for ~15 sec max with therapist at side. Pt and daughter want pt to go home with daughter as caregiver and pt does have some DME needs at home. Recommending hoyer lift, hospital bed and HHPT at d/c. Pt currently with functional limitations due to the deficits listed below (see PT Problem List). Pt will benefit from skilled PT to increase their independence and safety with mobility to allow discharge to the venue listed below.          Recommendations for follow up therapy are one component of a multi-disciplinary discharge planning process, led by the attending physician.  Recommendations may be updated based on patient status, additional functional criteria and insurance authorization.  Follow Up Recommendations Home health PT      Assistance Recommended at Discharge Frequent or constant  Supervision/Assistance  Patient can return home with the following  Two people to help with walking and/or transfers;Two people to help with bathing/dressing/bathroom;Assistance with cooking/housework;Assist for transportation;Help with stairs or ramp for entrance    Equipment Recommendations Hospital bed;Other (comment) (hoyer lift)  Recommendations for Other Services       Functional Status Assessment Patient has had a recent decline in their functional status and demonstrates the ability to make significant improvements in function in a reasonable and predictable amount of time.     Precautions / Restrictions Precautions Precautions: Fall Precaution Comments: high pain Restrictions Weight Bearing Restrictions: No      Mobility  Bed Mobility Overal bed mobility: Needs Assistance Bed Mobility: Supine to Sit, Sit to Supine  Supine to sit: Max assist Sit to supine: Max assist, +2 for physical assistance   General bed mobility comments: max A for supine-sit, pt attempting to inch BLE towards EOB, reaching with BUE towards bedrail and attempts to push through R elbow to upright trunk into sitting, significant increased time and verbal cues with realaxation technique to perform; max A+2 for return to supine and to scoot pt up in bed with bedpad, pt able to assume slight hooklying position to push through heels and assist with scooting up in bed    Transfers  General transfer comment: not attempted, poor sitting balance    Ambulation/Gait  General Gait Details: not attempted, poor sitting balance  Stairs            Wheelchair Mobility    Modified Rankin (Stroke Patients Only)       Balance Overall balance assessment: Needs assistance Sitting-balance support:  Feet supported Sitting balance-Leahy Scale: Poor Sitting balance - Comments: pt tolerates sitting EOB for ~3 minutes, needing min-mod A for static sitting, able to maintain propped sitting with BUE for ~15  seconds before fatiguing with LOB and needing physical assist       Pertinent Vitals/Pain Pain Assessment Pain Assessment: 0-10 Pain Location: 10/10 "all over" with movement, comes down to 4/10 with sitting rest break and 3/10 with supine rest break Pain Descriptors / Indicators: Grimacing, Guarding, Discomfort Pain Intervention(s): Limited activity within patient's tolerance, Monitored during session, Repositioned    Home Living Family/patient expects to be discharged to:: Private residence Living Arrangements: Children Available Help at Discharge: Family;Available 24 hours/day Type of Home: House Home Access: Ramped entrance  Home Layout: Two level;Able to live on main level with bedroom/bathroom Home Equipment: Rollator (4 wheels);BSC/3in1;Tub bench;Toilet riser;Transport chair (slideboard) Additional Comments: pt with adjustable bed, STEDY sts lift    Prior Function Prior Level of Function : Needs assist  Mobility Comments: pt was ambulating with rollator Nov 2023, since then has become progressively weaker, currently using STEDY to transfer at home and not amb in ~3 weeks ADLs Comments: assistance with all self care tasks     Hand Dominance   Dominant Hand: Right    Extremity/Trunk Assessment   Upper Extremity Assessment Upper Extremity Assessment: Defer to OT evaluation    Lower Extremity Assessment Lower Extremity Assessment: RLE deficits/detail;LLE deficits/detail RLE Deficits / Details: ankle 2-/5, quad 3-/5, hip abduction 2-/5, hip adduction 2-/5, hip flexion 2-/5; pt limited by pain, reports "numbness" circumferentially for entire leg but also verbalizes feeling light touch RLE Sensation: decreased light touch LLE Deficits / Details: ankle 2-/5, quad 3-/5, hip abduction 2-/5, hip adduction 2-/5, hip flexion 2-/5; pt limited by pain, reports "numbness" circumferentially for entire leg but also verbalizes feeling light touch LLE Sensation: decreased light touch     Cervical / Trunk Assessment Cervical / Trunk Assessment: Kyphotic (poor posture vs bony)  Communication   Communication: HOH  Cognition Arousal/Alertness: Awake/alert Behavior During Therapy: WFL for tasks assessed/performed, Anxious Overall Cognitive Status: Within Functional Limits for tasks assessed    General Comments: Pt able to follow commands, HOH needing some repeated cues, reports high pain with movement and anxiousness anticipating pain with movement. Pt and daughter providing history and timeline regarding decline.        General Comments      Exercises     Assessment/Plan    PT Assessment Patient needs continued PT services  PT Problem List Decreased strength;Decreased range of motion;Decreased activity tolerance;Decreased balance;Decreased mobility;Decreased knowledge of use of DME;Decreased safety awareness;Pain;Impaired sensation;Obesity       PT Treatment Interventions DME instruction;Gait training;Functional mobility training;Therapeutic activities;Therapeutic exercise;Balance training;Neuromuscular re-education;Patient/family education;Wheelchair mobility training    PT Goals (Current goals can be found in the Care Plan section)  Acute Rehab PT Goals Patient Stated Goal: return home with 24/7 family support PT Goal Formulation: With patient/family Time For Goal Achievement: 02/25/22 Potential to Achieve Goals: Fair    Frequency Min 2X/week     Co-evaluation               AM-PAC PT "6 Clicks" Mobility  Outcome Measure Help needed turning from your back to your side while in a flat bed without using bedrails?: Total Help needed moving from lying on your back to sitting on the side of a flat bed without using bedrails?: Total Help needed moving to and from a bed to a chair (including a  wheelchair)?: Total Help needed standing up from a chair using your arms (e.g., wheelchair or bedside chair)?: Total Help needed to walk in hospital room?:  Total Help needed climbing 3-5 steps with a railing? : Total 6 Click Score: 6    End of Session   Activity Tolerance: Patient limited by pain Patient left: in bed;with call bell/phone within reach;with family/visitor present (MD In room) Nurse Communication: Mobility status PT Visit Diagnosis: Other abnormalities of gait and mobility (R26.89);Muscle weakness (generalized) (M62.81)    Time: 9728-2060 PT Time Calculation (min) (ACUTE ONLY): 50 min   Charges:   PT Evaluation $PT Eval Moderate Complexity: 1 Mod PT Treatments $Therapeutic Activity: 8-22 mins         Tori Shirleen Mcfaul PT, DPT 02/11/22, 2:19 PM

## 2022-02-11 NOTE — Progress Notes (Signed)
PROGRESS NOTE  Carmen Haney KPV:[id number] DOB: 05-Oct-1930   PCP: [name], NP  Patient is from: Home.   DOA: 02/10/2022 LOS: 0  Chief complaints Chief Complaint  Patient presents with   Spasms     Brief Narrative / Interim history: 89 year old F with PMH of paroxysmal A-fib on Eliquis, T12 and L3 compression fractures, right breast cancer, CAD, diastolic CHF, HTN, HLD, hypothyroidism and hard of hearing presenting with BLE weakness, spastic back and leg pain and ambulatory dysfunction.  Basic labs and CT head without significant finding.  She was admitted due to inability to ambulate due to BLE weakness and intractable back and leg pain.  Lumbar MRI ordered.  Palliative medicine consulted.    Lumbar MRI suggest strain and/or hemorrhage partially covered at the right iliopsoas, remote T12 and L3 compression fracture and generalized lumbar spine degeneration with mild spinal and foraminal stenosis.  CT of the pelvis was recommended to rule out hemorrhage and showed diffusely enlarged large iliopsoas muscle with ill-defined margins suggesting inflammation or edema or injury.    Subjective: Seen and examined earlier this morning and later this afternoon.  Talked to patient's daughters at bedside.  Family concerned about patient's inability to ambulate and increased dependence for ADL's due to spastic back pain and leg pain and bilateral lower extremity weakness.  Patient and family questioning about possible stiff person syndrome.  They are not interested in pursuing aggressive intervention such as lumbar puncture but asking about possible blood test.  Daughter reports brief improvement in her mood and pain after Ativan last night, and asking if Ativan could be scheduled.  Objective: Vitals:   02/11/22 0503 02/11/22 0816 02/11/22 1102 02/11/22 1303  BP: (!) 126/59 130/69  113/61  Pulse: 75 78 71 (!) 58  Resp: '14 18  18  '$ Temp: 98.4 F (36.9 C) 98 F (36.7 C)  97.6 F (36.4 C)   TempSrc: Oral Oral  Oral  SpO2: 91% 94% 97% 95%  Weight:      Height:        Examination:  GENERAL: No apparent distress.  Nontoxic. HEENT: MMM.  Vision grossly intact.  Diminished hearing. NECK: Supple.  No apparent JVD.  RESP:  No IWOB.  Fair aeration bilaterally. CVS:  RRR. Heart sounds normal.  ABD/GI/GU: BS+. Abd soft, NTND.  MSK/EXT:  Moves extremities.  BLE weakness, 3/5. SKIN: no apparent skin lesion or wound NEURO: Awake, alert and oriented appropriately.  No apparent focal neuro deficit other than BLE weakness, 3/5. PSYCH: Calm. Normal affect.   Procedures:  None  Microbiology summarized: None  Assessment and plan: Principal Problem:   Lower extremity weakness Active Problems:   CAD (coronary artery disease)   Obesities, morbid (HCC)   HTN (hypertension)   Hyperlipidemia   Neoplasm of right breast, primary tumor staging category Tis: ductal carcinoma in situ (DCIS)   Vertebral fracture, pathological   Hypothyroidism (acquired)   Sensorineural hearing loss (SNHL), bilateral   Anxiety and depression   Osteoporosis   Atrial fibrillation (HCC)  Bilateral lower extremity weakness/spastic back pain: Unclear etiology of this MRI and CT suggest inflammation of the right iliopsoas muscle which could be causing muscle strain.  Denies recent fall or trauma.  Progressive decline in mobility.  Progressive dependence for ADLs.  Exam is nonfocal other than bilateral lower extremity weakness.  Low suspicion for infection but reports some UTI symptoms. -Hospitalized in November due to fall and weakness and discharged to CIR -Multimodal pain control with  Robaxin, scheduled Tylenol, oxycodone and IV Dilaudid -Ativan every 8 hours as needed anxiety -Check CRP, ESR and urinalysis.  If inflammatory markers elevated, may consider systemic steroid -May need EMG outpatient. -Ordered anti-GAD at request of patient and family -Continue PT/OT   Paroxysmal atrial fibrillation:  Currently in sinus rhythm.  On Eliquis at home. -Continue home metoprolol and Eliquis  Anxiety and depression: Concern about anxiety contributing -In the process of transitioning from Zoloft to Cymbalta -Ativan 0.5 mg every 8 hours as needed -Continue home Rexulti  Sensorineural hearing loss (SNHL), bilateral -Needs hearing aids or for the person to speak loudly to her   Hypothyroidism (acquired) -Continue Synthroid   Chronic vertebral fracture, pathological/osteoporosis: -Continue calcium and vitamin D   Ductal carcinoma in situ (DCIS): In remission.   Hyperlipidemia -Not on statin due to ongoing muscle weakness   HTN (hypertension): Normotensive. -Continue home meds   History of of CAD: no anginal symptoms. -Continue home metoprolol.  Urinary incontinence/dysuria -Check urinalysis  Morbid obesity Body mass index is 36.28 kg/m.          DVT prophylaxis:   apixaban (ELIQUIS) tablet 5 mg  Code Status: Full code  Family Communication: updated patient's daughters at bedside Level of care: Med-Surg Status is: Observation The patient will require care spanning > 2 midnights and should be moved to inpatient because: Intractable back pain and leg pain with bilateral lower extremity weakness and ambulatory dysfunction   Final disposition: Home Consultants:  None  55 minutes with more than 50% spent in reviewing records, counseling patient/family and coordinating care.   Sch Meds:  Scheduled Meds:  acetaminophen  650 mg Oral Q6H WA   acidophilus  1 capsule Oral Daily   apixaban  5 mg Oral BID   Brexpiprazole  0.25 mg Oral Daily   calcium-vitamin D  1 tablet Oral Q breakfast   docusate sodium  100 mg Oral BID   DULoxetine  20 mg Oral Daily   levothyroxine  150 mcg Oral QAC breakfast   methocarbamol  500 mg Oral TID   metoprolol tartrate  12.5 mg Oral BID   sertraline  100 mg Oral QHS   Followed by   Derrill Memo ON 02/15/2022] sertraline  50 mg Oral QHS   sodium  chloride (PF)       sodium chloride flush  3 mL Intravenous Q12H   Continuous Infusions:  sodium chloride     PRN Meds:.sodium chloride, HYDROmorphone (DILAUDID) injection, LORazepam, metoprolol tartrate, ondansetron **OR** ondansetron (ZOFRAN) IV, oxyCODONE, polyethylene glycol, sodium chloride (PF), sodium chloride flush, traMADol, traZODone  Antimicrobials: Anti-infectives (From admission, onward)    None        I have personally reviewed the following labs and images: CBC: Recent Labs  Lab 02/10/22 1653 02/11/22 0937  WBC 9.2 6.3  NEUTROABS 6.6  --   HGB 13.6 12.2  HCT 41.4 37.4  MCV 92.2 91.9  PLT 244 218   BMP &GFR Recent Labs  Lab 02/10/22 1653 02/11/22 0937  NA 131* 128*  K 4.3 4.2  CL 92* 94*  CO2 28 25  GLUCOSE 113* 164*  BUN 20 16  CREATININE 0.83 0.75  CALCIUM 8.6* 8.0*  MG 2.4 2.1  PHOS  --  3.4   Estimated Creatinine Clearance: 51.6 mL/min (by C-G formula based on SCr of 0.75 mg/dL). Liver & Pancreas: Recent Labs  Lab 02/10/22 1653 02/11/22 0937  AST 26  --   ALT 14  --   ALKPHOS 73  --  BILITOT 0.7  --   PROT 7.7  --   ALBUMIN 3.7 3.1*   No results for input(s): "LIPASE", "AMYLASE" in the last 168 hours. No results for input(s): "AMMONIA" in the last 168 hours. Diabetic: No results for input(s): "HGBA1C" in the last 72 hours. No results for input(s): "GLUCAP" in the last 168 hours. Cardiac Enzymes: No results for input(s): "CKTOTAL", "CKMB", "CKMBINDEX", "TROPONINI" in the last 168 hours. No results for input(s): "PROBNP" in the last 8760 hours. Coagulation Profile: No results for input(s): "INR", "PROTIME" in the last 168 hours. Thyroid Function Tests: No results for input(s): "TSH", "T4TOTAL", "FREET4", "T3FREE", "THYROIDAB" in the last 72 hours. Lipid Profile: No results for input(s): "CHOL", "HDL", "LDLCALC", "TRIG", "CHOLHDL", "LDLDIRECT" in the last 72 hours. Anemia Panel: No results for input(s): "VITAMINB12", "FOLATE",  "FERRITIN", "TIBC", "IRON", "RETICCTPCT" in the last 72 hours. Urine analysis:    Component Value Date/Time   COLORURINE STRAW (A) 11/27/2021 0710   APPEARANCEUR CLEAR 11/27/2021 0710   LABSPEC 1.010 11/27/2021 0710   PHURINE 6.0 11/27/2021 0710   GLUCOSEU NEGATIVE 11/27/2021 0710   HGBUR SMALL (A) 11/27/2021 0710   BILIRUBINUR NEGATIVE 11/27/2021 0710   KETONESUR NEGATIVE 11/27/2021 0710   PROTEINUR NEGATIVE 11/27/2021 0710   NITRITE NEGATIVE 11/27/2021 0710   LEUKOCYTESUR NEGATIVE 11/27/2021 0710   Sepsis Labs: Invalid input(s): "PROCALCITONIN", "LACTICIDVEN"  Microbiology: No results found for this or any previous visit (from the past 240 hour(s)).  Radiology Studies: CT ABDOMEN PELVIS W CONTRAST  Result Date: 02/11/2022 CLINICAL DATA:  Possible retroperitoneal hemorrhage. EXAM: CT ABDOMEN AND PELVIS WITH CONTRAST TECHNIQUE: Multidetector CT imaging of the abdomen and pelvis was performed using the standard protocol following bolus administration of intravenous contrast. RADIATION DOSE REDUCTION: This exam was performed according to the departmental dose-optimization program which includes automated exposure control, adjustment of the mA and/or kV according to patient size and/or use of iterative reconstruction technique. CONTRAST:  120m OMNIPAQUE IOHEXOL 300 MG/ML  SOLN COMPARISON:  MRI of same day. FINDINGS: Lower chest: No acute abnormality. Hepatobiliary: No cholelithiasis or biliary dilatation is noted. Multiple hepatic cysts are noted. Pancreas: Unremarkable. No pancreatic ductal dilatation or surrounding inflammatory changes. Spleen: Mild splenomegaly. Adrenals/Urinary Tract: Adrenal glands appear normal. Nonobstructive right renal calculus is noted. Bilateral renal cysts are noted. No hydronephrosis or renal obstruction is noted. Urinary bladder is unremarkable. Stomach/Bowel: Stomach is within normal limits. Appendix appears normal. No evidence of bowel wall thickening,  distention, or inflammatory changes. Vascular/Lymphatic: No significant vascular findings are present. No enlarged abdominal or pelvic lymph nodes. Reproductive: Status post hysterectomy. No adnexal masses. Other: No abdominal wall hernia or abnormality. No abdominopelvic ascites. Musculoskeletal: Old T12 and L3 compression fractures are noted. No acute osseous abnormality is noted. The right iliopsoas muscle is diffusely enlarged with ill-defined margins suggesting inflammation, edema or injury. No definite evidence of hemorrhage is noted. IMPRESSION: Right iliopsoas muscle is diffusely enlarged with ill-defined margins suggesting inflammation or edema or possibly injury. No definite hematoma is noted. Nonobstructive right renal calculus. No hydronephrosis or renal obstruction. Electronically Signed   By: JMarijo ConceptionM.D.   On: 02/11/2022 09:15   MR LUMBAR SPINE WO CONTRAST  Result Date: 02/11/2022 CLINICAL DATA:  Lumbar compression fracture EXAM: MRI LUMBAR SPINE WITHOUT CONTRAST TECHNIQUE: Multiplanar, multisequence MR imaging of the lumbar spine was performed. No intravenous contrast was administered. COMPARISON:  11/26/2021 FINDINGS: Segmentation:  Standard. Alignment: Hyperlordosis with mild L2-3 retrolisthesis and L5-S1, L4-5 anterolisthesis Vertebrae: Remote compression fractures at T12  and L3. Minimal superior endplate edema at L3 is likely reactive. Height loss is greater at T12 and measures up to 50% anteriorly when compared to L1. No acute or subacute fracture. No aggressive bone lesion. Conus medullaris and cauda equina: Conus extends to the L1-2 level. Conus and cauda equina appear normal. Paraspinal and other soft tissues: Negative for perispinal mass or inflammation. Expansion and T2 hyperintensity in the lower right iliopsoas Disc levels: T12- L1: Disc space narrowing and ventral spurring. No neural impingement L1-L2: Disc space narrowing and bulging. Negative facets. No neural impingement  L2-L3: Disc narrowing and bulging. Bilateral inferior foraminal protrusion. Negative facets. Mild spinal stenosis L3-L4: Disc space narrowing with inferior foraminal bulging. Mild facet spurring. Mild spinal stenosis L4-L5: Degenerative facet spurring. Circumferential disc bulging. Mild spinal stenosis L5-S1:Degenerative facet spurring with mild anterolisthesis. Mild circumferential disc bulging. No neural compression. IMPRESSION: 1. Strain and/or hemorrhage partially covered at the right iliopsoas, consider CT of the pelvis. 2. No acute lumbar fracture. Remote compression fractures at T12 and L3. 3. Generalized lumbar spine degeneration as described. Electronically Signed   By: Jorje Guild M.D.   On: 02/11/2022 06:33   CT HEAD WO CONTRAST (5MM)  Result Date: 02/10/2022 CLINICAL DATA:  Neuro deficit, acute, stroke suspected EXAM: CT HEAD WITHOUT CONTRAST TECHNIQUE: Contiguous axial images were obtained from the base of the skull through the vertex without intravenous contrast. RADIATION DOSE REDUCTION: This exam was performed according to the departmental dose-optimization program which includes automated exposure control, adjustment of the mA and/or kV according to patient size and/or use of iterative reconstruction technique. COMPARISON:  Head CT 11/26/2021 FINDINGS: Brain: No intracranial hemorrhage, mass effect, or midline shift. Brain volume is normal for age. No hydrocephalus. The basilar cisterns are patent. Minimal basal gangliar mineralization is likely senescent. Mild periventricular and deep chronic small vessel ischemic change. No evidence of territorial infarct or acute ischemia. No extra-axial or intracranial fluid collection. Vascular: No hyperdense vessel or unexpected calcification. Skull: No fracture or focal lesion. Sinuses/Orbits: Chronic sphenoid sinusitis with cortical thickening and fluid. No acute findings. No mastoid effusion. Bilateral cataract resection. Other: None. IMPRESSION: 1.  No acute intracranial abnormality. 2. Mild chronic small vessel ischemic change. 3. Chronic sphenoid sinusitis. Electronically Signed   By: Keith Rake M.D.   On: 02/10/2022 18:19      Timothee Gali T. Wink  If 7PM-7AM, please contact night-coverage [email or web address] 02/11/2022, 2:40 PM

## 2022-02-11 NOTE — TOC Initial Note (Addendum)
Transition of Care [facility]) - Initial/Assessment Note    Patient Details  Name: Carmen Haney MRN: [id number] Date of Birth: [birth year]  Transition of [facility]) CM/SW Contact:    Angelita Ingles, RN Phone [phone]  02/11/2022, 9:59 AM  Clinical Narrative:    [facility] acknowledges consult for  Home Health / DME Needs             Transition of Care [facility]) Screening Note   Patient Details  Name: Carmen Haney Date of Birth: 1930-05-29   Transition of Care [facility]) CM/SW Contact:    Angelita Ingles, RN Phone Number: 02/11/2022, 9:59 AM    Transition of Care [facility]) has reviewed patient and no TOC needs have been identified at this time. We will continue to monitor patient advancement through interdisciplinary progression rounds.           Patient Goals and CMS Choice            Expected Discharge Plan and Services                                              Prior Living Arrangements/Services                       Activities of Daily Living Home Assistive Devices/Equipment: Wheelchair ADL Screening (condition at time of admission) Patient's cognitive ability adequate to safely complete daily activities?: Yes Is the patient deaf or have difficulty hearing?: Yes Does the patient have difficulty seeing, even when wearing glasses/contacts?: No Does the patient have difficulty concentrating, remembering, or making decisions?: No Patient able to express need for assistance with ADLs?: Yes Does the patient have difficulty dressing or bathing?: Yes Independently performs ADLs?: No Communication: Needs assistance Is this a change from baseline?: Pre-admission baseline Dressing (OT): Needs assistance Is this a change from baseline?: Pre-admission baseline Grooming: Needs assistance Is this a change from baseline?: Pre-admission baseline Feeding: Independent Bathing: Needs assistance Is this a change from baseline?: Pre-admission  baseline Toileting: Needs assistance Is this a change from baseline?: Pre-admission baseline In/Out Bed: Needs assistance Is this a change from baseline?: Pre-admission baseline Walks in Home: Dependent Is this a change from baseline?: Pre-admission baseline Does the patient have difficulty walking or climbing stairs?: Yes Weakness of Legs: Both Weakness of Arms/Hands: Both  Permission Sought/Granted                  Emotional Assessment              Admission diagnosis:  Muscle spasm [M62.838] Lower extremity weakness [R29.898] Pain in both lower extremities [M79.604, M79.605] Patient Active Problem List   Diagnosis Date Noted   Lower extremity weakness 02/10/2022   Fracture of right proximal fibula 11/27/2021   T12 compression fracture ([place]) 11/27/2021   Effusion of bursa of left knee 11/27/2021   Fall 78/24/2353   Diastolic dysfunction 61/44/3154   Hemangioma of skin and subcutaneous tissue 02/19/2021   History of malignant neoplasm of skin 02/19/2021   Intertrigo 02/19/2021   Other seborrheic keratosis 02/19/2021   Atrial fibrillation ([place]) 02/16/2021   Atrial fibrillation with RVR ([place]) 02/15/2021   Degeneration of lumbar intervertebral disc 10/02/2020   Low back pain 10/02/2020   Personal history of colonic polyps 08/21/2020   Other specified symptoms and signs involving the circulatory and respiratory systems  08/21/2020   Other abnormalities of gait and mobility 08/21/2020   Anxiety and depression 08/21/2020   Chronic kidney disease (CKD), stage III (moderate) (HCC) 08/21/2020   Osteoporosis 08/21/2020   Abnormal digestive tract function 08/21/2020   Chronic pain of right knee 08/15/2019   Vertebral fracture, pathological    Hypothyroidism (acquired)    Personal history of radiation therapy    Breast CA ([place])    Genetic testing 10/22/2016   Sensorineural hearing loss (SNHL), bilateral 07/19/2015   Anxiety disorder 07/19/2015   Sclerosing adenosis of  left breast 11/15/2014   Bruit 08/03/2013   Family history of malignant neoplasm of breast 07/28/2013   Neoplasm of right breast, primary tumor staging category Tis: ductal carcinoma in situ (DCIS) 10/20/2012   Obesities, morbid (HCC)    HTN (hypertension)    Hyperlipidemia    CAD (coronary artery disease) 07/13/1998   PCP:  [name], NP Pharmacy:   [facility] [id number], [place] [address] Phone: [phone] Fax: [phone]     Social Determinants of Health (SDOH) Social History: SDOH Screenings   Food Insecurity: No Food Insecurity (02/10/2022)  Housing: Low Risk  (02/10/2022)  Transportation Needs: No Transportation Needs (02/10/2022)  Utilities: Not At Risk (02/10/2022)  Tobacco Use: Medium Risk (02/10/2022)   SDOH Interventions:     Readmission Risk Interventions    11/27/2021    1:51 PM  Readmission Risk Prevention Plan  Transportation Screening Complete  PCP or Specialist Appt within 5-7 Days Complete  Home Care Screening Complete  Medication Review (RN CM) Complete

## 2022-02-11 NOTE — Progress Notes (Signed)
  Daily Progress Note   Patient Name: Carmen Haney       Date: 02/11/2022 DOB: 10-02-1930  Age: 89 y.o. MRN#: [id number] Attending Physician: Mercy Riding, MD Primary Care Physician: [name], NP Admit Date: 02/10/2022 Length of Stay: 0 days  Consult placed for palliative medicine team. Upon review of EMR, patient still undergoing appropraite medical workup to assist in determining source of medical condition. As having further information would be greatly beneficial to assist in guiding [place] discussions moving forward, will plan to see complete full consult tomorrow or afterwards if appropriate. Thank you.   Chelsea Aus, DO Palliative Care Provider PMT # [phone]

## 2022-02-11 NOTE — Progress Notes (Signed)
Pt arrived to unit from ED with daughter at bedside. Pt c/o pain 4-5/10 from hips down to feet. Pt educated about the pain medication available to her and PRN pain medication administered with good results. Pt rested comfortably overnight and stated pain was well controlled. Pt informed about MRI scan planned in the morning. Pt expressed understanding. Pt resting with bed in locked lowest position and call bell within reach. Plan of care continues.

## 2022-02-12 DIAGNOSIS — E039 Hypothyroidism, unspecified: Secondary | ICD-10-CM | POA: Diagnosis not present

## 2022-02-12 DIAGNOSIS — F419 Anxiety disorder, unspecified: Secondary | ICD-10-CM | POA: Diagnosis not present

## 2022-02-12 DIAGNOSIS — I10 Essential (primary) hypertension: Secondary | ICD-10-CM | POA: Diagnosis not present

## 2022-02-12 DIAGNOSIS — I2510 Atherosclerotic heart disease of native coronary artery without angina pectoris: Secondary | ICD-10-CM | POA: Diagnosis not present

## 2022-02-12 DIAGNOSIS — D0511 Intraductal carcinoma in situ of right breast: Secondary | ICD-10-CM | POA: Diagnosis not present

## 2022-02-12 DIAGNOSIS — R29898 Other symptoms and signs involving the musculoskeletal system: Secondary | ICD-10-CM | POA: Diagnosis not present

## 2022-02-12 DIAGNOSIS — I480 Paroxysmal atrial fibrillation: Secondary | ICD-10-CM | POA: Diagnosis not present

## 2022-02-12 DIAGNOSIS — M810 Age-related osteoporosis without current pathological fracture: Secondary | ICD-10-CM | POA: Diagnosis not present

## 2022-02-12 LAB — COMPREHENSIVE METABOLIC PANEL
ALT: 12 U/L (ref 0–44)
AST: 17 U/L (ref 15–41)
Albumin: 3.2 g/dL — ABNORMAL LOW (ref 3.5–5.0)
Alkaline Phosphatase: 60 U/L (ref 38–126)
Anion gap: 7 (ref 5–15)
BUN: 15 mg/dL (ref 8–23)
CO2: 28 mmol/L (ref 22–32)
Calcium: 8.5 mg/dL — ABNORMAL LOW (ref 8.9–10.3)
Chloride: 94 mmol/L — ABNORMAL LOW (ref 98–111)
Creatinine, Ser: 0.67 mg/dL (ref 0.44–1.00)
GFR, Estimated: >60 mL/min (ref >60)
Glucose, Bld: 106 mg/dL — ABNORMAL HIGH (ref 70–99)
Potassium: 4.4 mmol/L (ref 3.5–5.1)
Sodium: 129 mmol/L — ABNORMAL LOW (ref 135–145)
Total Bilirubin: 0.6 mg/dL (ref 0.3–1.2)
Total Protein: 6.6 g/dL (ref 6.5–8.1)

## 2022-02-12 LAB — CK: Total CK: 132 U/L (ref 38–234)

## 2022-02-12 LAB — MAGNESIUM: Magnesium: 2.3 mg/dL (ref 1.7–2.4)

## 2022-02-12 LAB — SEDIMENTATION RATE: Sed Rate: 58 mm/hr — ABNORMAL HIGH (ref 0–22)

## 2022-02-12 LAB — GLUTAMIC ACID DECARBOXYLASE AUTO ABS: Glutamic Acid Decarb Ab: 160.3 U/mL — ABNORMAL HIGH (ref 0.0–5.0)

## 2022-02-12 MED ORDER — ACETAMINOPHEN 325 MG PO TABS
650.0000 mg | ORAL_TABLET | Freq: Four times a day (QID) | ORAL | Status: DC
  Administered 2022-02-12 – 2022-02-14 (×7): 650 mg via ORAL
  Filled 2022-02-12 (×7): qty 2

## 2022-02-12 MED ORDER — TRAMADOL HCL 50 MG PO TABS
50.0000 mg | ORAL_TABLET | Freq: Two times a day (BID) | ORAL | Status: DC
  Administered 2022-02-12 – 2022-02-14 (×4): 50 mg via ORAL
  Filled 2022-02-12 (×4): qty 1

## 2022-02-12 MED ORDER — PREDNISONE 20 MG PO TABS
40.0000 mg | ORAL_TABLET | Freq: Every day | ORAL | Status: DC
  Administered 2022-02-12 – 2022-02-14 (×3): 40 mg via ORAL
  Filled 2022-02-12 (×3): qty 2

## 2022-02-12 NOTE — Evaluation (Signed)
Occupational Therapy Evaluation Patient Details Name: Carmen Haney MRN: [id number] DOB: 1930/12/17 Today's Date: 02/12/2022   History of Present Illness Carmen Haney is a 89 y.o. female admitted with BLE weakness. MRI: no acute lumbar fracture; CT: "Right iliopsoas muscle is diffusely enlarged with ill-defined margins suggesting inflammation or edema or possibly injury. No definite hematoma is noted." PMH: paroxysmal A-fib, vertebral fracture, right breast cancer, CAD, CHF PEF, HTN, HLD, hypothyroid, hard of hearing   Clinical Impression   Mrs. Carmen Haney is an 89 year old woman who was able to walk walk and function in November 2023 and has had a steady decline. Has been requiring use of stedy device at home to transfer and not ambulated and has increasingly weakened in lower extremities. Patent exhibits with functional upper body strength and functional ROM but reports generalized pain due to overuse on transfer device. Very limited evaluation due to patient's response to movement in lower extremities - she immediately tenses up and extends and moans. Pain is reported as spasms - though no specific muscle jerking or muscle contraction is noted. Therapist suspects psychosomatic and anticipatory response to pain causing her who whole body to tighten and extend. Unable to attempt transfer at this time. She is near total assist at bed level for ADLs except for feeding and grooming. She is total assist for bed mobility. Family is wanting to take her home with [facility]. PT has recommended hoyer and hospital bed - and OT agrees. Patient will benefit from skilled OT services while in hospital to improve deficits and learn compensatory strategies as needed in order to improve functional abilities in order to reduce caregiver burden.      Recommendations for follow up therapy are one component of a multi-disciplinary discharge planning process, led by the attending physician.  Recommendations may be updated based  on patient status, additional functional criteria and insurance authorization.   Follow Up Recommendations  Home health OT     Assistance Recommended at Discharge Frequent or constant Supervision/Assistance  Patient can return home with the following Two people to help with bathing/dressing/bathroom;Two people to help with walking and/or transfers;Help with stairs or ramp for entrance;Assist for transportation    Functional Status Assessment  Patient has had a recent decline in their functional status and demonstrates the ability to make significant improvements in function in a reasonable and predictable amount of time.  Equipment Recommendations  None recommended by OT    Recommendations for Other Services       Precautions / Restrictions Precautions Precautions: Fall Precaution Comments: high pain and anxiety Restrictions Weight Bearing Restrictions: No      Mobility Bed Mobility               General bed mobility comments: due to patient's anticipatory response to pain and movement (guarding, rigidity, extending) unable to move anything except lower legs. She is total assist for movement and pain 10/10    Transfers                          Balance                                           ADL either performed or assessed with clinical judgement   ADL Overall ADL's : Needs assistance/impaired Eating/Feeding: Set up;Bed level   Grooming: Set up;Bed level  Upper Body Bathing: Moderate assistance;Bed level   Lower Body Bathing: Total assistance;Bed level   Upper Body Dressing : Moderate assistance;Bed level   Lower Body Dressing: Total assistance;Bed level   Toilet Transfer: Total assistance   Toileting- Clothing Manipulation and Hygiene: Total assistance;Bed level       Functional mobility during ADLs: Total assistance;+2 for physical assistance       Vision   Vision Assessment?: No apparent visual deficits      Perception     Praxis      Pertinent Vitals/Pain Pain Assessment Pain Assessment: 0-10 Pain Score: 10-Worst pain ever Pain Location: 10/10 with any movement due to spasms Pain Descriptors / Indicators: Grimacing, Guarding Pain Intervention(s): Limited activity within patient's tolerance, Monitored during session, Repositioned     Hand Dominance Right   Extremity/Trunk Assessment Upper Extremity Assessment Upper Extremity Assessment: RUE deficits/detail;LUE deficits/detail RUE Deficits / Details: WFL ROM and grossly 4/5 strength - shoulder pain from over use. suspect arthritic shoulders RUE Sensation: WNL RUE Coordination: WNL LUE Deficits / Details: WFL ROM and grossly 4/5 strength - shoulder pain from over use -suspect arthritic shoulders LUE Sensation: WNL LUE Coordination: WNL   Lower Extremity Assessment Lower Extremity Assessment: Defer to PT evaluation   Cervical / Trunk Assessment Cervical / Trunk Assessment: Kyphotic   Communication Communication Communication: HOH   Cognition Arousal/Alertness: Awake/alert Behavior During Therapy: WFL for tasks assessed/performed Overall Cognitive Status: Within Functional Limits for tasks assessed                                 General Comments: Pt able to follow commands, HOH, reports high pain with movement and anxiousness anticipating pain with movement. Pt and daughter providing history and timeline regarding decline.     General Comments       Exercises     Shoulder Instructions      Home Living Family/patient expects to be discharged to:: Private residence Living Arrangements: Children Available Help at Discharge: Family;Available 24 hours/day Type of Home: House Home Access: Ramped entrance     Home Layout: Two level;Able to live on main level with bedroom/bathroom         Bathroom Toilet: Standard (toilet riser)     Home Equipment: Rollator (4 wheels);BSC/3in1;Tub bench;Toilet  riser;Transport chair (slideboard)   Additional Comments: pt with adjustable bed, STEDY sts lift      Prior Functioning/Environment Prior Level of Function : Needs assist             Mobility Comments: pt was ambulating with rollator Nov 2023, since then has become progressively weaker, currently using STEDY to transfer at home and not amb in ~3 weeks ADLs Comments: assistance with all self care tasks        OT Problem List: Decreased strength;Decreased activity tolerance;Decreased safety awareness;Decreased knowledge of use of DME or AE;Obesity;Pain      OT Treatment/Interventions: Self-care/ADL training;Therapeutic exercise;Neuromuscular education;DME and/or AE instruction;Therapeutic activities;Balance training;Patient/family education    OT Goals(Current goals can be found in the care plan section) Acute Rehab OT Goals Patient Stated Goal: to move without pain OT Goal Formulation: With patient/family Time For Goal Achievement: 02/26/22 Potential to Achieve Goals: Fair  OT Frequency: Min 2X/week    Co-evaluation              AM-PAC OT "6 Clicks" Daily Activity     Outcome Measure Help from another person eating meals?: A Little  Help from another person taking care of personal grooming?: A Little Help from another person toileting, which includes using toliet, bedpan, or urinal?: Total Help from another person bathing (including washing, rinsing, drying)?: A Lot Help from another person to put on and taking off regular upper body clothing?: A Lot Help from another person to put on and taking off regular lower body clothing?: Total 6 Click Score: 12   End of Session Nurse Communication: Mobility status  Activity Tolerance: Patient limited by pain;Other (comment) (limited by anxiety) Patient left: in bed;with call bell/phone within reach;with family/visitor present  OT Visit Diagnosis: Pain                Time: 1121-6244 OT Time Calculation (min): 35  min Charges:  OT General Charges $OT Visit: 1 Visit OT Evaluation $OT Eval Low Complexity: 1 Low OT Treatments $Neuromuscular Re-education: 8-22 mins  Gustavo Lah, OTR/L [place]  Office [phone]   Lenward Chancellor 02/12/2022, 10:19 AM

## 2022-02-12 NOTE — Progress Notes (Signed)
Chaplain engaged in an initial visit with Inez Catalina.  Lyra explored her theology around prayer and suffering.  She noted that she did not feel like she was praying in the right way to God.  Chaplain and Emanuella engaged in discussion around [place] honestly conveying her feelings to God.  Chaplain could assess some apprehension from [place] to share about the reality of her grief, and even anger, to God.  Dianah shared that she lost her husband two years ago and that she has still had a lot of feelings around that.  Chaplain engaged in some theological discourse around suffering and "going through, " as well as affirmed and normalized her feelings.   Anyeli also shared that she ultimately feels like a burden to her daughters.  She has been used to taking care of them and now it has been really hard to accept help in all forms.  She voiced, "My daughter has to clean me up."  Chaplain could assess some shame around that for her.  Chaplain worked to normalize the cycle of life in which children often become caregivers.  Chaplain also honored [employer] feelings while highlighting that there is additional support available for her if needed, and for her daughters if they have caregiver fatigue.  Taneisha shared that her daughter seemed to be heavily impacted by taking care of her.  Tashema expressed worry.  Chaplain let her know that there are resources available.  Adora also stated that her daughters have never said that they are not able to take care of her.    Karsen was able to discuss her mental health history, and noted that she has dealt with depression and takes medicine regularly.  Her husband was a big help in keeping her regulated.  She described the ways he would help her get through.  Chaplain could assess that her grief has been magnified by her health issues.    Chaplain provided reflective listening, a compassionate presence, awareness of support, discussion of healthy theology, and prayer with Daishia.  She noted that  she felt much better after visit.  She was glad to candidly talk about some things that had been on her mind.   Chaplain Markesia Crilly, MDiv   02/12/22 1400  Spiritual Encounters  Type of Visit Initial  Care provided to: Patient  Referral source Patient request;Nurse (RN/NT/LPN)  Reason for visit Routine spiritual support  Spiritual Framework  Presenting Themes Meaning/purpose/sources of inspiration;Coping tools;Caregiving needs;Impactful experiences and emotions;Rituals and practive  Community/Connection Family  Needs/Challenges/Barriers Transitions  Patient Stress Factors Family relationships;Health changes;Major life changes  Interventions  Spiritual Care Interventions Made Prayer;Supported grief process;Established relationship of care and support;Compassionate presence;Reflective listening;Normalization of emotions;Explored values/beliefs/practices/strengths  Intervention Outcomes  Outcomes Reduced anxiety;Awareness of support;Awareness around self/spiritual resourses;Connection to spiritual care;Reduced fear  Spiritual Care Plan  Spiritual Care Issues Still Outstanding Chaplain will continue to follow

## 2022-02-12 NOTE — TOC Progression Note (Addendum)
Transition of Care [facility]) - Progression Note    Patient Details  Name: Carmen Haney MRN: [id number] Date of Birth: [birth year]  Transition of Care [facility]) CM/SW Fox, RN Phone [phone]  02/12/2022, 3:10 PM  Clinical Narrative:    [facility] consulted for DME needs.CM spoke with daughter [name] about DME and [name] is agreeable.  Hospital bed ,hoyer lift referral has been sent to [facility]. Per Brenton Grills with [employer] the goal for delivery is Friday but Saturday at the latest. Daughter has been updated. Daughter is requesting wheelchair but states that she previously had to reject a wheelchair because it was too wide for the doorways. CM has sent inquiry to [facility] with [employer]. Will await response. Wheelchair orders have been entered and to be delivered.   1611 Patient is active with [facility] for home health services and will resume at discharge. Daughter [name] agreeable with resumption of home health with [facility].         Expected Discharge Plan and Services                                               Social Determinants of Health (SDOH) Interventions SDOH Screenings   Food Insecurity: No Food Insecurity (02/10/2022)  Housing: Low Risk  (02/10/2022)  Transportation Needs: No Transportation Needs (02/10/2022)  Utilities: Not At Risk (02/10/2022)  Tobacco Use: Medium Risk (02/10/2022)    Readmission Risk Interventions    11/27/2021    1:51 PM  Readmission Risk Prevention Plan  Transportation Screening Complete  PCP or Specialist Appt within 5-7 Days Complete  Home Care Screening Complete  Medication Review (RN CM) Complete

## 2022-02-12 NOTE — Progress Notes (Signed)
  Daily Progress Note   Patient Name: Carmen Haney       Date: 02/12/2022 DOB: 1930/10/17  Age: 89 y.o. MRN#: [id number] Attending Physician: Mercy Riding, MD Primary Care Physician: [name], NP Admit Date: 02/10/2022 Length of Stay: 0 days  New consult was placed for PMT involvement on 02/10/22. During hospitalization patient has undergone evaluation and concern weakness is secondary to psychosomatic manifestations such from depression/anxiety. Medications being properly adjusted to assist with mood management. Plan is for patient to return home with PT hopefully on 2/2. At this time, will cancel palliative medicine consult. If it is appropraite, could consider outpatient palliative medicine referral. Informed hospitalist of this. Thank you.    Chelsea Aus, DO Palliative Care Provider PMT # [phone]

## 2022-02-12 NOTE — Care Management Obs Status (Signed)
[place] NOTIFICATION   Patient Details  Name: ASHIKA APUZZO MRN: [id number] Date of Birth: 10/28/1930   Medicare Observation Status Notification Given:  Yes    Angelita Ingles, RN 02/12/2022, 2:50 PM

## 2022-02-12 NOTE — Progress Notes (Signed)
PROGRESS NOTE  Carmen Haney MLY:[id number] DOB: 02/23/1930   PCP: [name], NP  Patient is from: Home.   DOA: 02/10/2022 LOS: 0  Chief complaints Chief Complaint  Patient presents with   Spasms     Brief Narrative / Interim history: 89 year old F with PMH of paroxysmal A-fib on Eliquis, T12 and L3 compression fractures, right breast cancer, CAD, diastolic CHF, HTN, HLD, hypothyroidism and hard of hearing presenting with BLE weakness, spastic back and leg pain and ambulatory dysfunction.  Basic labs and CT head without significant finding.  She was admitted due to inability to ambulate due to BLE weakness and intractable back and leg pain.  Lumbar MRI ordered.  Palliative medicine consulted.   Lumbar MRI suggest strain and/or hemorrhage partially covered at the right iliopsoas, remote T12 and L3 compression fracture and generalized lumbar spine degeneration with mild spinal and foraminal stenosis.  CT of the pelvis was recommended to rule out hemorrhage and showed diffusely enlarged large iliopsoas muscle with ill-defined margins suggesting inflammation or edema or injury.  Inflammatory markers elevated.  Patient has been started on prednisone and multimodal pain regimen.  Plan for discharge home with home health and DME on 2/2.    Subjective: Seen and examined earlier this morning.  No major events overnight of this morning.  Per RN, patient slept the whole night without issue.  However, patient feels she was "drugged" with multiple sedating medication.  She continues to endorse intermittent spasm.  At the same time, she is very anxious about narcotics and pain medications.  Daughter asking if tramadol could be scheduled since patient might not ask.  Still with significant weakness in both legs.  Currently not in much pain as long as she stays still in bed.  We discussed about blood work including elevated inflammatory markers which is not specific.  We have discussed about trial of  steroid which could help with inflammation and pain.  Objective: Vitals:   02/11/22 1303 02/11/22 2146 02/12/22 0547 02/12/22 0936  BP: 113/61 124/60 136/72 123/64  Pulse: (!) 58 66 66 61  Resp: '18 14 14   '$ Temp: 97.6 F (36.4 C) 98.7 F (37.1 C) 97.6 F (36.4 C)   TempSrc: Oral Oral Oral   SpO2: 95% 94% 96%   Weight:      Height:        Examination:  GENERAL: No apparent distress.  Nontoxic. HEENT: MMM.  Vision grossly intact.  Hard of hearing. NECK: Supple.  No apparent JVD.  RESP:  No IWOB.  Fair aeration bilaterally. CVS:  RRR. Heart sounds normal.  ABD/GI/GU: BS+. Abd soft, NTND.  MSK/EXT:  Moves extremities.  BLE weakness, 3/5. SKIN: no apparent skin lesion or wound NEURO: Awake and alert. Oriented appropriately.  No apparent focal neuro deficit other than BLE weakness, 3/5. PSYCH: Calm. Normal affect.   Procedures:  None  Microbiology summarized: None  Assessment and plan: Principal Problem:   Lower extremity weakness Active Problems:   CAD (coronary artery disease)   Obesities, morbid (HCC)   HTN (hypertension)   Hyperlipidemia   Neoplasm of right breast, primary tumor staging category Tis: ductal carcinoma in situ (DCIS)   Vertebral fracture, pathological   Hypothyroidism (acquired)   Sensorineural hearing loss (SNHL), bilateral   Anxiety and depression   Osteoporosis   Atrial fibrillation (HCC)  Bilateral lower extremity weakness/spastic back pain: Unclear etiology of this MRI and CT suggest inflammation of the right iliopsoas muscle which could be causing muscle  strain.  Denies recent fall or trauma.  Progressive decline in mobility.  Progressive dependence for ADLs.  Exam is nonfocal other than bilateral lower extremity weakness.  Low suspicion for infection.  CRP elevated to 16.3.  ESR elevated to 58. -Hospitalized in November due to fall and weakness and discharged to CIR -Multimodal pain control with Robaxin, scheduled Tylenol and tramadol -Ativan  every 8 hours as needed anxiety -Trial of p.o. prednisone 40 mg daily for 5 days -May need EMG outpatient. -Follow anti-GAD level. -Ordered home health and hospital bed as recommended by therapy.   Paroxysmal atrial fibrillation: Currently in sinus rhythm.  On Eliquis at home. -Continue home metoprolol and Eliquis  Anxiety and depression: Concern about physical manifestation of anxiety and depression -In the process of transitioning from Zoloft to Cymbalta -Ativan 0.5 mg every 8 hours as needed -Continue home Rexulti  Sensorineural hearing loss (SNHL), bilateral -Needs hearing aids or for the person to speak loudly to her   Hypothyroidism (acquired) -Continue Synthroid   Chronic vertebral fracture, pathological/osteoporosis: -Continue calcium and vitamin D   Ductal carcinoma in situ (DCIS): In remission.   Hyperlipidemia -Not on statin due to ongoing muscle weakness   HTN (hypertension): Normotensive. -Continue home meds   History of of CAD: no anginal symptoms. -Continue home metoprolol.  Urinary incontinence/dysuria-urinalysis with moderate hemoglobinuria and rare bacteria.  CK within normal.  Mild hyponatremia: Unclear etiology of this but SSRI could contribute. -Continue monitoring  Morbid obesity Body mass index is 36.28 kg/m.          DVT prophylaxis:   apixaban (ELIQUIS) tablet 5 mg  Code Status: Full code  Family Communication: updated patient's daughters at bedside Level of care: Med-Surg Status is: Observation The patient will require care spanning > 2 midnights and should be moved to inpatient because: Intractable back pain and leg pain with bilateral lower extremity weakness and ambulatory dysfunction   Final disposition: Home Consultants:  None  55 minutes with more than 50% spent in reviewing records, counseling patient/family and coordinating care.   Sch Meds:  Scheduled Meds:  acetaminophen  650 mg Oral Q6H WA   acidophilus  1 capsule  Oral Daily   apixaban  5 mg Oral BID   Brexpiprazole  0.25 mg Oral Daily   calcium-vitamin D  1 tablet Oral Q breakfast   docusate sodium  100 mg Oral BID   DULoxetine  20 mg Oral Daily   levothyroxine  150 mcg Oral QAC breakfast   methocarbamol  500 mg Oral TID   metoprolol tartrate  12.5 mg Oral BID   predniSONE  40 mg Oral Q breakfast   sertraline  100 mg Oral QHS   Followed by   Derrill Memo ON 02/15/2022] sertraline  50 mg Oral QHS   sodium chloride flush  3 mL Intravenous Q12H   traMADol  50 mg Oral Q12H   Continuous Infusions:  sodium chloride     PRN Meds:.sodium chloride, HYDROmorphone (DILAUDID) injection, LORazepam, metoprolol tartrate, ondansetron **OR** ondansetron (ZOFRAN) IV, oxyCODONE, polyethylene glycol, sodium chloride flush, traZODone  Antimicrobials: Anti-infectives (From admission, onward)    None        I have personally reviewed the following labs and images: CBC: Recent Labs  Lab 02/10/22 1653 02/11/22 0937  WBC 9.2 6.3  NEUTROABS 6.6  --   HGB 13.6 12.2  HCT 41.4 37.4  MCV 92.2 91.9  PLT 244 218   BMP &GFR Recent Labs  Lab 02/10/22 1653 02/11/22 0937 02/12/22  0601  NA 131* 128* 129*  K 4.3 4.2 4.4  CL 92* 94* 94*  CO2 '28 25 28  '$ GLUCOSE 113* 164* 106*  BUN '20 16 15  '$ CREATININE 0.83 0.75 0.67  CALCIUM 8.6* 8.0* 8.5*  MG 2.4 2.1 2.3  PHOS  --  3.4  --    Estimated Creatinine Clearance: 51.6 mL/min (by C-G formula based on SCr of 0.67 mg/dL). Liver & Pancreas: Recent Labs  Lab 02/10/22 1653 02/11/22 0937 02/12/22 0601  AST 26  --  17  ALT 14  --  12  ALKPHOS 73  --  60  BILITOT 0.7  --  0.6  PROT 7.7  --  6.6  ALBUMIN 3.7 3.1* 3.2*   No results for input(s): "LIPASE", "AMYLASE" in the last 168 hours. No results for input(s): "AMMONIA" in the last 168 hours. Diabetic: No results for input(s): "HGBA1C" in the last 72 hours. No results for input(s): "GLUCAP" in the last 168 hours. Cardiac Enzymes: Recent Labs  Lab  02/12/22 0601  CKTOTAL 132   No results for input(s): "PROBNP" in the last 8760 hours. Coagulation Profile: No results for input(s): "INR", "PROTIME" in the last 168 hours. Thyroid Function Tests: No results for input(s): "TSH", "T4TOTAL", "FREET4", "T3FREE", "THYROIDAB" in the last 72 hours. Lipid Profile: No results for input(s): "CHOL", "HDL", "LDLCALC", "TRIG", "CHOLHDL", "LDLDIRECT" in the last 72 hours. Anemia Panel: No results for input(s): "VITAMINB12", "FOLATE", "FERRITIN", "TIBC", "IRON", "RETICCTPCT" in the last 72 hours. Urine analysis:    Component Value Date/Time   COLORURINE YELLOW 02/11/2022 [place] 02/11/2022 1359   LABSPEC 1.019 02/11/2022 1359   PHURINE 5.0 02/11/2022 1359   GLUCOSEU NEGATIVE 02/11/2022 1359   HGBUR MODERATE (A) 02/11/2022 1359   BILIRUBINUR NEGATIVE 02/11/2022 1359   KETONESUR NEGATIVE 02/11/2022 1359   PROTEINUR NEGATIVE 02/11/2022 1359   NITRITE NEGATIVE 02/11/2022 1359   LEUKOCYTESUR NEGATIVE 02/11/2022 1359   Sepsis Labs: Invalid input(s): "PROCALCITONIN", "LACTICIDVEN"  Microbiology: No results found for this or any previous visit (from the past 240 hour(s)).  Radiology Studies: No results found.    Haniyah Maciolek T. Zoar  If 7PM-7AM, please contact night-coverage [email or web address] 02/12/2022, 12:25 PM

## 2022-02-12 NOTE — Progress Notes (Signed)
Patient suffers from weakness which impairs their ability to perform daily activities like bathing, dressing, feeding, grooming, and toileting in the home.  A cane, crutch, or walker will not resolve issue with performing activities of daily living. A wheelchair will allow patient to safely perform daily activities. Patient can safely propel the wheelchair in the home or has a caregiver who can provide assistance. Length of need 12 months .  Accessories: elevating leg rests (ELRs), wheel locks, extensions and anti-tippers

## 2022-02-13 DIAGNOSIS — D0511 Intraductal carcinoma in situ of right breast: Secondary | ICD-10-CM | POA: Diagnosis not present

## 2022-02-13 DIAGNOSIS — M810 Age-related osteoporosis without current pathological fracture: Secondary | ICD-10-CM | POA: Diagnosis not present

## 2022-02-13 DIAGNOSIS — I10 Essential (primary) hypertension: Secondary | ICD-10-CM | POA: Diagnosis not present

## 2022-02-13 DIAGNOSIS — R760 Raised antibody titer: Secondary | ICD-10-CM | POA: Insufficient documentation

## 2022-02-13 DIAGNOSIS — G2582 Stiff-man syndrome: Secondary | ICD-10-CM

## 2022-02-13 DIAGNOSIS — I480 Paroxysmal atrial fibrillation: Secondary | ICD-10-CM | POA: Diagnosis not present

## 2022-02-13 DIAGNOSIS — F419 Anxiety disorder, unspecified: Secondary | ICD-10-CM | POA: Diagnosis not present

## 2022-02-13 DIAGNOSIS — R29898 Other symptoms and signs involving the musculoskeletal system: Secondary | ICD-10-CM | POA: Diagnosis not present

## 2022-02-13 DIAGNOSIS — E039 Hypothyroidism, unspecified: Secondary | ICD-10-CM | POA: Diagnosis not present

## 2022-02-13 DIAGNOSIS — I2510 Atherosclerotic heart disease of native coronary artery without angina pectoris: Secondary | ICD-10-CM | POA: Diagnosis not present

## 2022-02-13 LAB — MAGNESIUM: Magnesium: 2.2 mg/dL (ref 1.7–2.4)

## 2022-02-13 LAB — CBC
HCT: 36.8 % (ref 36.0–46.0)
Hemoglobin: 12.1 g/dL (ref 12.0–15.0)
MCH: 29.9 pg (ref 26.0–34.0)
MCHC: 32.9 g/dL (ref 30.0–36.0)
MCV: 90.9 fL (ref 80.0–100.0)
Platelets: 281 10*3/uL (ref 150–400)
RBC: 4.05 MIL/uL (ref 3.87–5.11)
RDW: 13.5 % (ref 11.5–15.5)
WBC: 6.2 10*3/uL (ref 4.0–10.5)
nRBC: 0 % (ref 0.0–0.2)

## 2022-02-13 LAB — RENAL FUNCTION PANEL
Albumin: 2.9 g/dL — ABNORMAL LOW (ref 3.5–5.0)
Anion gap: 7 (ref 5–15)
BUN: 18 mg/dL (ref 8–23)
CO2: 29 mmol/L (ref 22–32)
Calcium: 8.6 mg/dL — ABNORMAL LOW (ref 8.9–10.3)
Chloride: 92 mmol/L — ABNORMAL LOW (ref 98–111)
Creatinine, Ser: 0.7 mg/dL (ref 0.44–1.00)
GFR, Estimated: >60 mL/min (ref >60)
Glucose, Bld: 152 mg/dL — ABNORMAL HIGH (ref 70–99)
Phosphorus: 3.9 mg/dL (ref 2.5–4.6)
Potassium: 4.4 mmol/L (ref 3.5–5.1)
Sodium: 128 mmol/L — ABNORMAL LOW (ref 135–145)

## 2022-02-13 LAB — SODIUM, URINE, RANDOM: Sodium, Ur: 45 mmol/L

## 2022-02-13 MED ORDER — DIAZEPAM 5 MG PO TABS
5.0000 mg | ORAL_TABLET | Freq: Three times a day (TID) | ORAL | Status: DC
  Administered 2022-02-13 – 2022-02-14 (×4): 5 mg via ORAL
  Filled 2022-02-13 (×4): qty 1

## 2022-02-13 MED ORDER — METHOCARBAMOL 500 MG PO TABS: 500.0000 mg | ORAL_TABLET | Freq: Three times a day (TID) | ORAL | 0 refills | Status: DC | PRN

## 2022-02-13 MED ORDER — PREDNISONE 20 MG PO TABS: 40.0000 mg | ORAL_TABLET | Freq: Every day | ORAL | 0 refills | Status: AC

## 2022-02-13 NOTE — Progress Notes (Signed)
Physical Therapy Treatment Patient Details Name: Carmen Haney MRN: [id number] DOB: [birth year] Today's Date: 02/13/2022   History of Present Illness Carmen Haney is a 89 y.o. female admitted with BLE weakness. MRI: no acute lumbar fracture; CT: "Right iliopsoas muscle is diffusely enlarged with ill-defined margins suggesting inflammation or edema or possibly injury. No definite hematoma is noted." PMH: paroxysmal A-fib, vertebral fracture, right breast cancer, CAD, CHF PEF, HTN, HLD, hypothyroid, hard of hearing    PT Comments    +2 total assist for rolling and sidelying to sit. Poor sitting balance requiring min A initially 2* posterior lean, eventually pt able to maintain trunk upright without assist. Pt sat edge of bed x 10 minutes. Attempted sit to stand with Stedy, but pt unable to come to full stand with +2 max assist. Instructed pt in BLE strengthening exercises. Mechanical lift recommended for transfers. Pt's bed placed in chair position at end of session, and heels floated as redness was noted at heels (RN notified).     Recommendations for follow up therapy are one component of a multi-disciplinary discharge planning process, led by the attending physician.  Recommendations may be updated based on patient status, additional functional criteria and insurance authorization.  Follow Up Recommendations  Home health PT     Assistance Recommended at Discharge Frequent or constant Supervision/Assistance  Patient can return home with the following Two people to help with walking and/or transfers;Two people to help with bathing/dressing/bathroom;Assistance with cooking/housework;Assist for transportation;Help with stairs or ramp for entrance   Equipment Recommendations  Hospital bed;Other (comment) (hoyer)    Recommendations for Other Services       Precautions / Restrictions Precautions Precautions: Fall Precaution Comments: high pain and anxiety Restrictions Weight Bearing  Restrictions: No     Mobility  Bed Mobility Overal bed mobility: Needs Assistance Bed Mobility: Supine to Sit, Sit to Supine, Rolling, Sidelying to Sit Rolling: Total assist, +2 for physical assistance Sidelying to sit: Total assist, +2 for physical assistance   Sit to supine: +2 for physical assistance, +2 for safety/equipment   General bed mobility comments: +2 total assist (pt 10%) for rolling and sidelying to sit, then for sit to supine    Transfers Overall transfer level: Needs assistance Equipment used: Ambulation equipment used Transfers: Sit to/from Stand             General transfer comment: attempted sit to stand with Stedy, pt unable to come to full stand with +2 max assist so returned to EOB. Pt sat edge of bed ~10 minutes    Ambulation/Gait                   Stairs             Wheelchair Mobility    Modified Rankin (Stroke Patients Only)       Balance Overall balance assessment: Needs assistance Sitting-balance support: Feet supported Sitting balance-Leahy Scale: Poor Sitting balance - Comments: sat EOB ~10 minutes initially required single UE support and min A 2* posterior lean, then later able to maintain trunk upright without UE support.                                    Cognition Arousal/Alertness: Awake/alert Behavior During Therapy: WFL for tasks assessed/performed Overall Cognitive Status: Within Functional Limits for tasks assessed  General Comments: Pt able to follow commands, HOH, reports high pain with movement and anxiousness anticipating pain with movement.        Exercises General Exercises - Lower Extremity Ankle Circles/Pumps: AROM, Both, 10 reps, Supine Short Arc Quad: AROM, Both, 10 reps, Supine    General Comments        Pertinent Vitals/Pain Pain Assessment Pain Assessment: Faces Faces Pain Scale: Hurts even more Pain Location: RLE and "all  over" Pain Descriptors / Indicators: Grimacing, Guarding, Spasm Pain Intervention(s): Limited activity within patient's tolerance, Monitored during session, Patient requesting pain meds-RN notified, Repositioned    Home Living                          Prior Function            PT Goals (current goals can now be found in the care plan section) Acute Rehab PT Goals Patient Stated Goal: return home with 24/7 family support; to get stronger PT Goal Formulation: With patient Time For Goal Achievement: 02/25/22 Potential to Achieve Goals: Fair Progress towards PT goals: Progressing toward goals    Frequency    Min 2X/week      PT Plan Current plan remains appropriate    Co-evaluation              AM-PAC PT "6 Clicks" Mobility   Outcome Measure  Help needed turning from your back to your side while in a flat bed without using bedrails?: Total Help needed moving from lying on your back to sitting on the side of a flat bed without using bedrails?: Total Help needed moving to and from a bed to a chair (including a wheelchair)?: Total Help needed standing up from a chair using your arms (e.g., wheelchair or bedside chair)?: Total Help needed to walk in hospital room?: Total Help needed climbing 3-5 steps with a railing? : Total 6 Click Score: 6    End of Session   Activity Tolerance: Patient limited by fatigue;No increased pain Patient left: in bed;with call bell/phone within reach;with nursing/sitter in room Nurse Communication: Mobility status;Need for lift equipment PT Visit Diagnosis: Other abnormalities of gait and mobility (R26.89);Muscle weakness (generalized) (M62.81)     Time: 8115-7262 PT Time Calculation (min) (ACUTE ONLY): 23 min  Charges:  $Therapeutic Activity: 23-37 mins                    Blondell Reveal Kistler PT 02/13/2022  Acute Rehabilitation Services  Office [phone]

## 2022-02-13 NOTE — Progress Notes (Signed)
PROGRESS NOTE  Carmen Haney JTT:[id number] DOB: [birth year]   PCP: [name], NP  Patient is from: Home.   DOA: 02/10/2022 LOS: 0  Chief complaints Chief Complaint  Patient presents with   Spasms     Brief Narrative / Interim history: 89 year old F with PMH of paroxysmal A-fib on Eliquis, T12 and L3 compression fractures, right breast cancer, CAD, diastolic CHF, HTN, HLD, hypothyroidism and hard of hearing presenting with BLE weakness, spastic back and leg pain and ambulatory dysfunction.  Basic labs and CT head without significant finding.  She was admitted due to inability to ambulate due to BLE weakness and intractable back and leg pain.  Lumbar MRI ordered.  Palliative medicine consulted.   Lumbar MRI suggest strain and/or hemorrhage partially covered at the right iliopsoas, remote T12 and L3 compression fracture and generalized lumbar spine degeneration with mild spinal and foraminal stenosis.  CT of the pelvis was recommended to rule out hemorrhage and showed diffusely enlarged large iliopsoas muscle with ill-defined margins suggesting inflammation or edema or injury.  Family raised concern about stiff person syndrome.  Serum Anti-GAD elevated to 160 (normal range 0-5.0) suggesting stiff person syndrome.  Started on diazepam 5 mg TID with a plan to titrate as tolerated. Inflammatory markers elevated.  Patient has been started on prednisone.   Therapy recommended home health and DME.    Subjective: Seen and examined earlier this morning.  Patient's daughter at bedside.  Feels better but still with frequent spasm and BLE weakness.   Objective: Vitals:   02/12/22 0936 02/12/22 1412 02/12/22 2103 02/13/22 0430  BP: 123/64 112/63 134/79 139/71  Pulse: 61 61 65 61  Resp:  '18 16 16  '$ Temp:  97.8 F (36.6 C) 98.4 F (36.9 C) (!) 97.3 F (36.3 C)  TempSrc:  Oral Oral Oral  SpO2:  98% 93% 94%  Weight:      Height:        Examination:  GENERAL: No apparent distress.   Nontoxic. HEENT: MMM.  Vision and hearing grossly intact.  NECK: Supple.  No apparent JVD.  RESP:  No IWOB.  Fair aeration bilaterally. CVS:  RRR. Heart sounds normal.  ABD/GI/GU: BS+. Abd soft, NTND.  MSK/EXT:  Moves extremities.  BLE weakness 3+/5 on the right and 4/5 on the left SKIN: no apparent skin lesion or wound NEURO: Awake and alert. Oriented appropriately.  As above. PSYCH: Calm. Normal affect.   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  MRI and CT suggest inflammation of the right iliopsoas muscle which could be causing muscle strain.  Low suspicion for infection.  CRP elevated to 16.3.  ESR elevated to 58.  Some improvement in BLE weakness and pain.  Still with frequent spasm.  -Hospitalized in November due to fall and weakness and discharged to CIR -Start diazepam 5 mg 3 times daily, will titrate  up as tolerated based on symptoms. -Continue prednisone 40 mg daily for total of 5 days -May need EMG outpatient.  She has upcoming appointment with neurology outpatient. -Ordered home health, hospital bed and wheelchair.  Waiting on delivery.   Paroxysmal atrial fibrillation: Currently in sinus rhythm.  On Eliquis at home. -Continue home metoprolol and Eliquis  Anxiety and  depression: Concern about physical manifestation of anxiety and depression -In the process of transitioning from Zoloft to Cymbalta -Ativan 0.5 mg every 8 hours as needed -Also on diazepam as above. -Continue home Rexulti  Sensorineural hearing loss (SNHL), bilateral -Needs hearing aids or for the person to speak loudly to her   Hypothyroidism (acquired) -Continue Synthroid   Chronic vertebral fracture, pathological/osteoporosis: -Continue calcium and vitamin D   Ductal carcinoma in situ (DCIS): In remission.   Hyperlipidemia -Not on statin due to ongoing muscle weakness   HTN (hypertension): Normotensive. -Continue home meds   History of of CAD: no anginal symptoms. -Continue home metoprolol.  Urinary incontinence/dysuria-urinalysis with moderate hemoglobinuria and rare bacteria.  CK within normal.  Hyponatremia: Unclear etiology of this but SSRI could contribute. -Continue monitoring -Check urine sodium and osmolality  Morbid obesity Body mass index is 36.28 kg/m.          DVT prophylaxis:   apixaban (ELIQUIS) tablet 5 mg  Code Status: Full code  Family Communication: updated patient's daughters at bedside Level of care: Med-Surg Status is: Observation The patient will require care spanning > 2 midnights and should be moved to inpatient because: Intractable back pain and leg pain with bilateral lower extremity weakness and ambulatory dysfunction   Final disposition: Home Consultants:  None  55 minutes with more than 50% spent in reviewing records, counseling patient/family and coordinating care.   Sch Meds:  Scheduled Meds:  acetaminophen  650 mg Oral Q6H WA   acidophilus  1 capsule Oral Daily   apixaban  5 mg Oral BID   Brexpiprazole  0.25 mg Oral Daily   calcium-vitamin D  1 tablet Oral Q breakfast   diazepam  5 mg Oral Q8H   docusate sodium  100 mg Oral BID   DULoxetine  20 mg Oral Daily   levothyroxine  150 mcg Oral QAC breakfast   metoprolol  tartrate  12.5 mg Oral BID   predniSONE  40 mg Oral Q breakfast   sertraline  100 mg Oral QHS   Followed by   Derrill Memo ON 02/15/2022] sertraline  50 mg Oral QHS   sodium chloride flush  3 mL Intravenous Q12H   traMADol  50 mg Oral Q12H   Continuous Infusions:  sodium chloride     PRN Meds:.sodium chloride, HYDROmorphone (DILAUDID) injection, LORazepam, metoprolol tartrate, ondansetron **OR** ondansetron (ZOFRAN) IV, oxyCODONE, polyethylene glycol, sodium chloride flush, traZODone  Antimicrobials: Anti-infectives (From admission, onward)    None        I have personally reviewed the following labs and images: CBC: Recent Labs  Lab 02/10/22 1653 02/11/22 0937 02/13/22 0505  WBC 9.2 6.3 6.2  NEUTROABS 6.6  --   --   HGB 13.6 12.2 12.1  HCT 41.4 37.4 36.8  MCV 92.2 91.9 90.9  PLT 244 218 281   BMP &GFR Recent Labs  Lab 02/10/22 1653 02/11/22 0937 02/12/22 0601 02/13/22 0505  NA 131* 128* 129* 128*  K 4.3 4.2 4.4 4.4  CL 92* 94* 94* 92*  CO2 '28 25 28 29  '$ GLUCOSE 113* 164* 106* 152*  BUN '20 16 15 '$ 18  CREATININE 0.83 0.75 0.67 0.70  CALCIUM 8.6* 8.0* 8.5* 8.6*  MG 2.4 2.1 2.3 2.2  PHOS  --  3.4  --  3.9   Estimated Creatinine Clearance: 51.6 mL/min (by C-G formula based on SCr of 0.7 mg/dL). Liver & Pancreas: Recent Labs  Lab 02/10/22 1653 02/11/22 0937 02/12/22 0601 02/13/22 0505  AST 26  --  17  --   ALT 14  --  12  --   ALKPHOS 73  --  60  --   BILITOT 0.7  --  0.6  --   PROT 7.7  --  6.6  --   ALBUMIN 3.7 3.1* 3.2* 2.9*   No results for input(s): "LIPASE", "AMYLASE" in the last 168 hours. No results for input(s): "AMMONIA" in the last 168 hours. Diabetic: No results for input(s): "HGBA1C" in the last 72 hours. No results for input(s): "GLUCAP" in the last 168 hours. Cardiac Enzymes: Recent Labs  Lab 02/12/22 0601  CKTOTAL 132   No results for input(s): "PROBNP" in the last 8760 hours. Coagulation Profile: No results for input(s): "INR",  "PROTIME" in the last 168 hours. Thyroid Function Tests: No results for input(s): "TSH", "T4TOTAL", "FREET4", "T3FREE", "THYROIDAB" in the last 72 hours. Lipid Profile: No results for input(s): "CHOL", "HDL", "LDLCALC", "TRIG", "CHOLHDL", "LDLDIRECT" in the last 72 hours. Anemia Panel: No results for input(s): "VITAMINB12", "FOLATE", "FERRITIN", "TIBC", "IRON", "RETICCTPCT" in the last 72 hours. Urine analysis:    Component Value Date/Time   COLORURINE YELLOW 02/11/2022 [place] 02/11/2022 1359   LABSPEC 1.019 02/11/2022 1359   PHURINE 5.0 02/11/2022 1359   GLUCOSEU NEGATIVE 02/11/2022 1359   HGBUR MODERATE (A) 02/11/2022 1359   BILIRUBINUR NEGATIVE 02/11/2022 1359   KETONESUR NEGATIVE 02/11/2022 1359   PROTEINUR NEGATIVE 02/11/2022 1359   NITRITE NEGATIVE 02/11/2022 1359   LEUKOCYTESUR NEGATIVE 02/11/2022 1359   Sepsis Labs: Invalid input(s): "PROCALCITONIN", "LACTICIDVEN"  Microbiology: No results found for this or any previous visit (from the past 240 hour(s)).  Radiology Studies: No results found.    Wendall Isabell T. [place]  If 7PM-7AM, please contact night-coverage [email or web address] 02/13/2022, 12:31 PM

## 2022-02-13 NOTE — Plan of Care (Signed)
  Problem: Clinical Measurements: Goal: Diagnostic test results will improve Outcome: Not Progressing   Problem: Clinical Measurements: Goal: Will remain free from infection Outcome: Not Progressing   Problem: Activity: Goal: Risk for activity intolerance will decrease Outcome: Not Progressing   Problem: Elimination: Goal: Will not experience complications related to bowel motility Outcome: Not Progressing   Problem: Safety: Goal: Ability to remain free from injury will improve Outcome: Not Progressing   Problem: Pain Managment: Goal: General experience of comfort will improve Outcome: Not Progressing

## 2022-02-14 DIAGNOSIS — R29898 Other symptoms and signs involving the musculoskeletal system: Secondary | ICD-10-CM | POA: Diagnosis not present

## 2022-02-14 DIAGNOSIS — M170 Bilateral primary osteoarthritis of knee: Secondary | ICD-10-CM | POA: Diagnosis not present

## 2022-02-14 DIAGNOSIS — M8448XS Pathological fracture, other site, sequela: Secondary | ICD-10-CM | POA: Diagnosis not present

## 2022-02-14 DIAGNOSIS — E039 Hypothyroidism, unspecified: Secondary | ICD-10-CM | POA: Diagnosis not present

## 2022-02-14 DIAGNOSIS — I130 Hypertensive heart and chronic kidney disease with heart failure and stage 1 through stage 4 chronic kidney disease, or unspecified chronic kidney disease: Secondary | ICD-10-CM | POA: Diagnosis not present

## 2022-02-14 DIAGNOSIS — Z7401 Bed confinement status: Secondary | ICD-10-CM | POA: Diagnosis not present

## 2022-02-14 DIAGNOSIS — G2582 Stiff-man syndrome: Secondary | ICD-10-CM | POA: Diagnosis not present

## 2022-02-14 DIAGNOSIS — H903 Sensorineural hearing loss, bilateral: Secondary | ICD-10-CM | POA: Diagnosis not present

## 2022-02-14 DIAGNOSIS — I10 Essential (primary) hypertension: Secondary | ICD-10-CM | POA: Diagnosis not present

## 2022-02-14 DIAGNOSIS — I5030 Unspecified diastolic (congestive) heart failure: Secondary | ICD-10-CM | POA: Diagnosis not present

## 2022-02-14 DIAGNOSIS — I4891 Unspecified atrial fibrillation: Secondary | ICD-10-CM | POA: Diagnosis not present

## 2022-02-14 DIAGNOSIS — N183 Chronic kidney disease, stage 3 unspecified: Secondary | ICD-10-CM | POA: Diagnosis not present

## 2022-02-14 DIAGNOSIS — S82831D Other fracture of upper and lower end of right fibula, subsequent encounter for closed fracture with routine healing: Secondary | ICD-10-CM | POA: Diagnosis not present

## 2022-02-14 DIAGNOSIS — I2510 Atherosclerotic heart disease of native coronary artery without angina pectoris: Secondary | ICD-10-CM | POA: Diagnosis not present

## 2022-02-14 DIAGNOSIS — R760 Raised antibody titer: Secondary | ICD-10-CM | POA: Diagnosis not present

## 2022-02-14 DIAGNOSIS — F32A Depression, unspecified: Secondary | ICD-10-CM | POA: Diagnosis not present

## 2022-02-14 DIAGNOSIS — E7800 Pure hypercholesterolemia, unspecified: Secondary | ICD-10-CM | POA: Diagnosis not present

## 2022-02-14 DIAGNOSIS — M810 Age-related osteoporosis without current pathological fracture: Secondary | ICD-10-CM | POA: Diagnosis not present

## 2022-02-14 DIAGNOSIS — F419 Anxiety disorder, unspecified: Secondary | ICD-10-CM | POA: Diagnosis not present

## 2022-02-14 DIAGNOSIS — E871 Hypo-osmolality and hyponatremia: Secondary | ICD-10-CM

## 2022-02-14 DIAGNOSIS — R531 Weakness: Secondary | ICD-10-CM | POA: Diagnosis not present

## 2022-02-14 LAB — RENAL FUNCTION PANEL
Albumin: 2.9 g/dL — ABNORMAL LOW (ref 3.5–5.0)
Anion gap: 6 (ref 5–15)
BUN: 22 mg/dL (ref 8–23)
CO2: 31 mmol/L (ref 22–32)
Calcium: 8.6 mg/dL — ABNORMAL LOW (ref 8.9–10.3)
Chloride: 92 mmol/L — ABNORMAL LOW (ref 98–111)
Creatinine, Ser: 0.7 mg/dL (ref 0.44–1.00)
GFR, Estimated: >60 mL/min (ref >60)
Glucose, Bld: 130 mg/dL — ABNORMAL HIGH (ref 70–99)
Phosphorus: 3.4 mg/dL (ref 2.5–4.6)
Potassium: 4.2 mmol/L (ref 3.5–5.1)
Sodium: 129 mmol/L — ABNORMAL LOW (ref 135–145)

## 2022-02-14 LAB — OSMOLALITY, URINE: Osmolality, Ur: 381 mOsm/kg (ref 300–900)

## 2022-02-14 LAB — MAGNESIUM: Magnesium: 2.2 mg/dL (ref 1.7–2.4)

## 2022-02-14 MED ORDER — DIAZEPAM 5 MG PO TABS: ORAL_TABLET | ORAL | 0 refills | Status: DC

## 2022-02-14 MED ORDER — TRAMADOL HCL 50 MG PO TABS: 50.0000 mg | ORAL_TABLET | Freq: Three times a day (TID) | ORAL | 0 refills | Status: DC | PRN

## 2022-02-14 MED ORDER — SODIUM CHLORIDE 1 G PO TABS: 1.0000 g | ORAL_TABLET | Freq: Two times a day (BID) | ORAL | 0 refills | Status: DC

## 2022-02-14 MED ORDER — SODIUM CHLORIDE 1 G PO TABS
1.0000 g | ORAL_TABLET | Freq: Two times a day (BID) | ORAL | Status: DC
  Administered 2022-02-14: 1 g via ORAL
  Filled 2022-02-14: qty 1

## 2022-02-14 NOTE — Discharge Summary (Signed)
Physician Discharge Summary  Carmen Haney:[id number] DOB: [birth year] DOA: 02/10/2022  PCP: [name], NP  Admit date: 02/10/2022 Discharge date: 02/14/2022 Admitted From: Home Disposition: Home Recommendations for Outpatient Follow-up:  Follow up with PCP in 1 to 2 weeks Outpatient follow-up with urology as previously planned Check CMP and CBC in 1 week Please follow up on the following pending results: None  Home Health: PT/OT Equipment/Devices: Hospital bed and wheelchair  Discharge Condition: Stable CODE STATUS: Full code  Follow-up Information     [name], NP. Schedule an appointment as soon as possible for a visit in 1 week(s).   Specialty: Adult Health Nurse Practitioner Contact information: [address] [place] [address] [phone]                 Hospital course 89 year old F with PMH of paroxysmal A-fib on Eliquis, T12 and L3 compression fractures, right breast cancer, CAD, diastolic CHF, HTN, HLD, hypothyroidism and hard of hearing presenting with BLE weakness, spastic back and leg pain and ambulatory dysfunction.  Basic labs and CT head without significant finding.  She was admitted due to inability to ambulate due to BLE weakness and intractable back and leg pain.  Lumbar MRI ordered.  Palliative medicine consulted.    Lumbar MRI suggest strain and/or hemorrhage partially covered at the right iliopsoas, remote T12 and L3 compression fracture and generalized lumbar spine degeneration with mild spinal and foraminal stenosis.  CT of the pelvis was recommended to rule out hemorrhage and showed diffusely enlarged large iliopsoas muscle with ill-defined margins suggesting inflammation or edema or injury.   Family raised concern about stiff person syndrome.  Serum Anti-GAD elevated to 160 (normal range 0-5.0) suggesting stiff person syndrome.  Started on diazepam 5 mg 3 times daily.  Inflammatory markers elevated.  Patient has been started on  prednisone.  Patient is discharged on diazepam 5 mg 3-4 times a day.  Also prednisone for 4 more days.  May consider titrating diazepam as tolerated.  She has upcoming appointment with neurology.  On the day of discharge, pain and spasm improved.  She is discharged with home health PT/OT.  Hospital bed and wheelchair delivered.  See individual problem list below for more.   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  MRI and CT suggest inflammation of the right iliopsoas muscle which could be causing muscle strain.  Low suspicion for infection.  CRP elevated to 16.3.  ESR elevated to 58.  Some improvement in BLE weakness and pain.  Spasm improved tremendously after starting diazepam. -Discharged on diazepam 5 mg 3-4 times a day.  May titrate as tolerated -Continue prednisone 40 mg daily for 4 more days. -Outpatient follow-up with neurology as previously planned -Ordered home health, hospital bed and wheelchair.   Paroxysmal atrial fibrillation: Currently in sinus rhythm.  On Eliquis at home. -Continue home metoprolol and Eliquis  Anxiety and depression: Concern about physical manifestation of anxiety and depression -In the process of transitioning from Zoloft to Cymbalta -Diazepam as above. -Discontinued Ativan while on  diazepam. -Continue home Rexulti.   Sensorineural hearing loss (SNHL), bilateral -Needs hearing aids or for the person to speak loudly to her   Hypothyroidism (acquired) -Continue Synthroid   Chronic vertebral fracture, pathological/osteoporosis: -Continue calcium and vitamin D   Ductal carcinoma in situ (DCIS): In remission.   Hyperlipidemia -Not on statin due to ongoing muscle weakness   HTN (hypertension): Normotensive. -Continue home meds   History of of CAD: no anginal symptoms. -Continue home metoprolol.   Urinary incontinence/dysuria-urinalysis with moderate hemoglobinuria and rare bacteria.  CK within normal.   Hyponatremia: Unclear etiology of this but SSRI could contribute.  Urine sodium elevated suggesting SIADH. -Started p.o. sodium chloride 1 g twice daily   Morbid obesity Body mass index is 36.28 kg/m.            Vital signs Vitals:   02/13/22 1443 02/13/22 1935 02/14/22 0515 02/14/22 1007  BP: 119/71 134/62 (!) 151/71 132/85  Pulse: 68 75 (!) 52 (!) 57  Temp: 98.6 F (37 C) 98.6 F (37 C) 97.7 F (36.5 C)   Resp: '18 18 18   '$ Height:      Weight:      SpO2: 92% 91% 95%   TempSrc: Oral Oral Oral   BMI (Calculated):         Discharge exam  GENERAL: No apparent distress.  Nontoxic. HEENT: MMM.  Vision grossly intact.  Hard of hearing. NECK: Supple.  No apparent JVD.  RESP:  No IWOB.  Fair aeration bilaterally. CVS:  RRR. Heart sounds normal.  ABD/GI/GU: BS+. Abd soft, NTND.  MSK/EXT:  Moves extremities.  BLE weakness, 3+/5 SKIN: no apparent skin lesion or wound NEURO: Awake and alert. Oriented appropriately.  No apparent focal neuro deficit other than BLE weakness as above. PSYCH: Calm. Normal affect.   Discharge Instructions Discharge Instructions     Call MD for:  severe uncontrolled pain   Complete by: As directed    Diet general   Complete by: As directed    Discharge instructions   Complete by: As directed    It has been a  pleasure taking care of you!  You were hospitalized due to pain/spasm in your bouts and leg weakness.  Concern that this could be from stiff person syndrome based on his symptoms and blood test.  We have started you on diazepam for this.  Your MRI and CT shows some inflammation in your back muscles.  Your inflammatory markers are elevated as well.  Unclear what is causing the inflammation.  We have started you on prednisone to help with inflammation and pain.  Please review your new medication list and the directions on your medications before you take them.  Follow-up with your primary care doctor in 1 to 2 weeks or sooner if needed.  Follow-up with GI as previously planned.   Take care,   Increase activity slowly   Complete by: As directed       Allergies as of 02/14/2022       Reactions   Ace Inhibitors Cough   Latex Rash        Medication List     STOP taking these medications    feeding supplement Liqd   furosemide 20 MG tablet Commonly known as: LASIX   LORazepam 0.5 MG tablet Commonly known as: ATIVAN   MAGNESIUM  PO       TAKE these medications    acetaminophen 500 MG tablet Commonly known as: TYLENOL Take 1,000 mg by mouth in the morning and at bedtime.   apixaban 5 MG Tabs tablet Commonly known as: ELIQUIS Take 1 tablet (5 mg total) by mouth 2 (two) times daily. What changed: when to take this   brexpiprazole 0.25 MG Tabs tablet Commonly known as: Rexulti Take 1 tablet (0.25 mg total) by mouth daily.   CALCIUM/D3 ADULT GUMMIES PO Take 500 mg by mouth in the morning.   diazepam 5 MG tablet Commonly known as: VALIUM Take 1 tablet 3-4 times a day   docusate sodium 100 MG capsule Commonly known as: COLACE Take 1 capsule (100 mg total) by mouth 2 (two) times daily. What changed: when to take this   DULoxetine 20 MG capsule Commonly known as: CYMBALTA Take 20 mg by mouth See admin instructions. Starting on 02/10/2022, take 20 mg by mouth once a day  for 7 days   DULoxetine 30 MG capsule Commonly known as: CYMBALTA Take 30 mg by mouth See admin instructions. Starting on 02/18/2022, take 30 mg by mouth once a day for 7 days Start taking on: February 18, 2022   DULoxetine HCl 40 MG Cpep Take 40 mg by mouth See admin instructions. Starting on 02/19/2022, take 40 mg by mouth once a day Start taking on: February 26, 2022   levothyroxine 150 MCG tablet Commonly known as: SYNTHROID Take 150 mcg by mouth daily before breakfast.   metoprolol tartrate 25 MG tablet Commonly known as: LOPRESSOR Take 0.5 tablets (12.5 mg total) by mouth 2 (two) times daily. What changed:  when to take this additional instructions   predniSONE 20 MG tablet Commonly known as: DELTASONE Take 2 tablets (40 mg total) by mouth daily with breakfast for 4 days.   PROBIOTIC GUMMIES PO Take 2 tablets by mouth in the morning.   sertraline 100 MG tablet Commonly known as: ZOLOFT Take 50-100 mg by mouth See admin instructions. Starting on 02/10/2022, taper off sertraline as follows: Take 100 mg by mouth daily at bedtime for 5 days, then 50 mg daily at bedtime for 5 days, then STOP   sodium chloride 1 g tablet Take 1 tablet (1 g total) by mouth 2 (two) times daily with a meal.   traMADol 50 MG tablet Commonly known as: Ultram Take 1 tablet (50 mg total) by mouth every 8 (eight) hours as needed for moderate pain. What changed: when to take this               Durable Medical Equipment  (From admission, onward)           Start     Ordered   02/12/22 1546  For home use only DME standard manual wheelchair with seat cushion  Once       Comments: Patient suffers from weakness which impairs their ability to perform daily activities like bathing, dressing, feeding, grooming, and toileting in the home.  A cane, crutch, or walker will not resolve issue with performing activities of daily living. A wheelchair will allow patient to safely perform daily  activities. Patient can safely propel the wheelchair in the home or has a caregiver who can provide assistance. Length of need 12 months . Accessories: elevating leg rests (ELRs), wheel locks, extensions and anti-tippers.   02/12/22 1546   02/12/22 1458  For home use only DME Hospital bed  Once  Comments: Therapeutic mattress  Question Answer Comment  Length of Need Lifetime   The above medical condition requires: Patient requires the ability to reposition frequently   Head must be elevated greater than: 30 degrees   Bed type Semi-electric   Hoyer Lift Yes   Support Surface: Gel Overlay      02/12/22 1458            Consultations: None  Procedures/Studies:   CT ABDOMEN PELVIS W CONTRAST  Result Date: 02/11/2022 CLINICAL DATA:  Possible retroperitoneal hemorrhage. EXAM: CT ABDOMEN AND PELVIS WITH CONTRAST TECHNIQUE: Multidetector CT imaging of the abdomen and pelvis was performed using the standard protocol following bolus administration of intravenous contrast. RADIATION DOSE REDUCTION: This exam was performed according to the departmental dose-optimization program which includes automated exposure control, adjustment of the mA and/or kV according to patient size and/or use of iterative reconstruction technique. CONTRAST:  193m OMNIPAQUE IOHEXOL 300 MG/ML  SOLN COMPARISON:  MRI of same day. FINDINGS: Lower chest: No acute abnormality. Hepatobiliary: No cholelithiasis or biliary dilatation is noted. Multiple hepatic cysts are noted. Pancreas: Unremarkable. No pancreatic ductal dilatation or surrounding inflammatory changes. Spleen: Mild splenomegaly. Adrenals/Urinary Tract: Adrenal glands appear normal. Nonobstructive right renal calculus is noted. Bilateral renal cysts are noted. No hydronephrosis or renal obstruction is noted. Urinary bladder is unremarkable. Stomach/Bowel: Stomach is within normal limits. Appendix appears normal. No evidence of bowel wall thickening, distention,  or inflammatory changes. Vascular/Lymphatic: No significant vascular findings are present. No enlarged abdominal or pelvic lymph nodes. Reproductive: Status post hysterectomy. No adnexal masses. Other: No abdominal wall hernia or abnormality. No abdominopelvic ascites. Musculoskeletal: Old T12 and L3 compression fractures are noted. No acute osseous abnormality is noted. The right iliopsoas muscle is diffusely enlarged with ill-defined margins suggesting inflammation, edema or injury. No definite evidence of hemorrhage is noted. IMPRESSION: Right iliopsoas muscle is diffusely enlarged with ill-defined margins suggesting inflammation or edema or possibly injury. No definite hematoma is noted. Nonobstructive right renal calculus. No hydronephrosis or renal obstruction. Electronically Signed   By: JMarijo ConceptionM.D.   On: 02/11/2022 09:15   MR LUMBAR SPINE WO CONTRAST  Result Date: 02/11/2022 CLINICAL DATA:  Lumbar compression fracture EXAM: MRI LUMBAR SPINE WITHOUT CONTRAST TECHNIQUE: Multiplanar, multisequence MR imaging of the lumbar spine was performed. No intravenous contrast was administered. COMPARISON:  11/26/2021 FINDINGS: Segmentation:  Standard. Alignment: Hyperlordosis with mild L2-3 retrolisthesis and L5-S1, L4-5 anterolisthesis Vertebrae: Remote compression fractures at T12 and L3. Minimal superior endplate edema at L3 is likely reactive. Height loss is greater at T12 and measures up to 50% anteriorly when compared to L1. No acute or subacute fracture. No aggressive bone lesion. Conus medullaris and cauda equina: Conus extends to the L1-2 level. Conus and cauda equina appear normal. Paraspinal and other soft tissues: Negative for perispinal mass or inflammation. Expansion and T2 hyperintensity in the lower right iliopsoas Disc levels: T12- L1: Disc space narrowing and ventral spurring. No neural impingement L1-L2: Disc space narrowing and bulging. Negative facets. No neural impingement L2-L3: Disc  narrowing and bulging. Bilateral inferior foraminal protrusion. Negative facets. Mild spinal stenosis L3-L4: Disc space narrowing with inferior foraminal bulging. Mild facet spurring. Mild spinal stenosis L4-L5: Degenerative facet spurring. Circumferential disc bulging. Mild spinal stenosis L5-S1:Degenerative facet spurring with mild anterolisthesis. Mild circumferential disc bulging. No neural compression. IMPRESSION: 1. Strain and/or hemorrhage partially covered at the right iliopsoas, consider CT of the pelvis. 2. No acute lumbar fracture. Remote compression fractures at T12 and  L3. 3. Generalized lumbar spine degeneration as described. Electronically Signed   By: Jorje Guild M.D.   On: 02/11/2022 06:33   CT HEAD WO CONTRAST (5MM)  Result Date: 02/10/2022 CLINICAL DATA:  Neuro deficit, acute, stroke suspected EXAM: CT HEAD WITHOUT CONTRAST TECHNIQUE: Contiguous axial images were obtained from the base of the skull through the vertex without intravenous contrast. RADIATION DOSE REDUCTION: This exam was performed according to the departmental dose-optimization program which includes automated exposure control, adjustment of the mA and/or kV according to patient size and/or use of iterative reconstruction technique. COMPARISON:  Head CT 11/26/2021 FINDINGS: Brain: No intracranial hemorrhage, mass effect, or midline shift. Brain volume is normal for age. No hydrocephalus. The basilar cisterns are patent. Minimal basal gangliar mineralization is likely senescent. Mild periventricular and deep chronic small vessel ischemic change. No evidence of territorial infarct or acute ischemia. No extra-axial or intracranial fluid collection. Vascular: No hyperdense vessel or unexpected calcification. Skull: No fracture or focal lesion. Sinuses/Orbits: Chronic sphenoid sinusitis with cortical thickening and fluid. No acute findings. No mastoid effusion. Bilateral cataract resection. Other: None. IMPRESSION: 1. No acute  intracranial abnormality. 2. Mild chronic small vessel ischemic change. 3. Chronic sphenoid sinusitis. Electronically Signed   By: Keith Rake M.D.   On: 02/10/2022 18:19       The results of significant diagnostics from this hospitalization (including imaging, microbiology, ancillary and laboratory) are listed below for reference.     Microbiology: No results found for this or any previous visit (from the past 240 hour(s)).   Labs:  CBC: Recent Labs  Lab 02/10/22 1653 02/11/22 0937 02/13/22 0505  WBC 9.2 6.3 6.2  NEUTROABS 6.6  --   --   HGB 13.6 12.2 12.1  HCT 41.4 37.4 36.8  MCV 92.2 91.9 90.9  PLT 244 218 281   BMP &GFR Recent Labs  Lab 02/10/22 1653 02/11/22 0937 02/12/22 0601 02/13/22 0505 02/14/22 0602  NA 131* 128* 129* 128* 129*  K 4.3 4.2 4.4 4.4 4.2  CL 92* 94* 94* 92* 92*  CO2 '28 25 28 29 31  '$ GLUCOSE 113* 164* 106* 152* 130*  BUN '20 16 15 18 22  '$ CREATININE 0.83 0.75 0.67 0.70 0.70  CALCIUM 8.6* 8.0* 8.5* 8.6* 8.6*  MG 2.4 2.1 2.3 2.2 2.2  PHOS  --  3.4  --  3.9 3.4   Estimated Creatinine Clearance: 51.6 mL/min (by C-G formula based on SCr of 0.7 mg/dL). Liver & Pancreas: Recent Labs  Lab 02/10/22 1653 02/11/22 0937 02/12/22 0601 02/13/22 0505 02/14/22 0602  AST 26  --  17  --   --   ALT 14  --  12  --   --   ALKPHOS 73  --  60  --   --   BILITOT 0.7  --  0.6  --   --   PROT 7.7  --  6.6  --   --   ALBUMIN 3.7 3.1* 3.2* 2.9* 2.9*   No results for input(s): "LIPASE", "AMYLASE" in the last 168 hours. No results for input(s): "AMMONIA" in the last 168 hours. Diabetic: No results for input(s): "HGBA1C" in the last 72 hours. No results for input(s): "GLUCAP" in the last 168 hours. Cardiac Enzymes: Recent Labs  Lab 02/12/22 0601  CKTOTAL 132   No results for input(s): "PROBNP" in the last 8760 hours. Coagulation Profile: No results for input(s): "INR", "PROTIME" in the last 168 hours. Thyroid Function Tests: No results for  input(s): "TSH", "  T4TOTAL", "FREET4", "T3FREE", "THYROIDAB" in the last 72 hours. Lipid Profile: No results for input(s): "CHOL", "HDL", "LDLCALC", "TRIG", "CHOLHDL", "LDLDIRECT" in the last 72 hours. Anemia Panel: No results for input(s): "VITAMINB12", "FOLATE", "FERRITIN", "TIBC", "IRON", "RETICCTPCT" in the last 72 hours. Urine analysis:    Component Value Date/Time   COLORURINE YELLOW 02/11/2022 [place] 02/11/2022 1359   LABSPEC 1.019 02/11/2022 1359   PHURINE 5.0 02/11/2022 1359   GLUCOSEU NEGATIVE 02/11/2022 1359   HGBUR MODERATE (A) 02/11/2022 1359   BILIRUBINUR NEGATIVE 02/11/2022 1359   KETONESUR NEGATIVE 02/11/2022 1359   PROTEINUR NEGATIVE 02/11/2022 1359   NITRITE NEGATIVE 02/11/2022 1359   LEUKOCYTESUR NEGATIVE 02/11/2022 1359   Sepsis Labs: Invalid input(s): "PROCALCITONIN", "LACTICIDVEN"   SIGNED:  Mercy Riding, MD  [facility] 02/14/2022, 2:49 PM

## 2022-02-14 NOTE — TOC Transition Note (Signed)
Transition of Care [facility]) - CM/SW Discharge Note   Patient Details  Name: Carmen Haney MRN: [id number] Date of Birth: 16-Jul-1930  Transition of [facility]) CM/SW Contact:  Rodney Booze, LCSW Phone Number: 02/14/2022, 12:04 PM   Clinical Narrative:    CSW spoke to the daughter patient will leave by PTAR. Bed has been delivered.   Final next level of care: Home/Self Care Barriers to Discharge: No Barriers Identified   Patient Goals and CMS Choice      Discharge Placement                    Name of family member notified: [name] Patient and family notified of of transfer: 02/14/22  Discharge Plan and Services Additional resources added to the After Visit Summary for                                       Social Determinants of Health (SDOH) Interventions SDOH Screenings   Food Insecurity: No Food Insecurity (02/10/2022)  Housing: Low Risk  (02/10/2022)  Transportation Needs: No Transportation Needs (02/10/2022)  Utilities: Not At Risk (02/10/2022)  Tobacco Use: Medium Risk (02/10/2022)     Readmission Risk Interventions    11/27/2021    1:51 PM  Readmission Risk Prevention Plan  Transportation Screening Complete  PCP or Specialist Appt within 5-7 Days Complete  Home Care Screening Complete  Medication Review (RN CM) Complete

## 2022-02-16 DIAGNOSIS — F32A Depression, unspecified: Secondary | ICD-10-CM | POA: Diagnosis not present

## 2022-02-16 DIAGNOSIS — I5030 Unspecified diastolic (congestive) heart failure: Secondary | ICD-10-CM | POA: Diagnosis not present

## 2022-02-16 DIAGNOSIS — I130 Hypertensive heart and chronic kidney disease with heart failure and stage 1 through stage 4 chronic kidney disease, or unspecified chronic kidney disease: Secondary | ICD-10-CM | POA: Diagnosis not present

## 2022-02-16 DIAGNOSIS — M810 Age-related osteoporosis without current pathological fracture: Secondary | ICD-10-CM | POA: Diagnosis not present

## 2022-02-16 DIAGNOSIS — M170 Bilateral primary osteoarthritis of knee: Secondary | ICD-10-CM | POA: Diagnosis not present

## 2022-02-16 DIAGNOSIS — F419 Anxiety disorder, unspecified: Secondary | ICD-10-CM | POA: Diagnosis not present

## 2022-02-16 DIAGNOSIS — S82831D Other fracture of upper and lower end of right fibula, subsequent encounter for closed fracture with routine healing: Secondary | ICD-10-CM | POA: Diagnosis not present

## 2022-02-16 DIAGNOSIS — I4891 Unspecified atrial fibrillation: Secondary | ICD-10-CM | POA: Diagnosis not present

## 2022-02-16 DIAGNOSIS — N183 Chronic kidney disease, stage 3 unspecified: Secondary | ICD-10-CM | POA: Diagnosis not present

## 2022-02-17 ENCOUNTER — Telehealth: Payer: Self-pay | Admitting: *Deleted

## 2022-02-17 NOTE — Telephone Encounter (Signed)
Gracie with [facility] [phone] called and stated that they went out and evaluated patient for Home Health PT and is requesting Verbal order for 1X1week and 2X2weeks and 1X1week to go out for PT.   Patient has an upcoming appointment.  Verbal orders given.

## 2022-02-19 DIAGNOSIS — F331 Major depressive disorder, recurrent, moderate: Secondary | ICD-10-CM | POA: Diagnosis not present

## 2022-02-19 DIAGNOSIS — F411 Generalized anxiety disorder: Secondary | ICD-10-CM | POA: Diagnosis not present

## 2022-02-20 DIAGNOSIS — N39490 Overflow incontinence: Secondary | ICD-10-CM | POA: Diagnosis not present

## 2022-02-20 DIAGNOSIS — N3946 Mixed incontinence: Secondary | ICD-10-CM | POA: Diagnosis not present

## 2022-02-20 DIAGNOSIS — R338 Other retention of urine: Secondary | ICD-10-CM | POA: Diagnosis not present

## 2022-02-23 ENCOUNTER — Ambulatory Visit (INDEPENDENT_AMBULATORY_CARE_PROVIDER_SITE_OTHER): Payer: Medicare PPO | Admitting: Orthopedic Surgery

## 2022-02-23 ENCOUNTER — Encounter: Payer: Self-pay | Admitting: Orthopedic Surgery

## 2022-02-23 DIAGNOSIS — Z0000 Encounter for general adult medical examination without abnormal findings: Secondary | ICD-10-CM

## 2022-02-23 NOTE — Progress Notes (Signed)
This service is provided via telemedicine  No vital signs collected/recorded due to the encounter was a telemedicine visit.   Location of patient (ex: home, work):  Home   Patient consents to a telephone visit:  Yes, see telephone encounter dated   Location of the provider (ex: office, home):  [facility]  Name of any referring provider:  N/A  Names of all persons participating in the telemedicine service and their role in the encounter: Sebron Mcmahill B/CMA, Windell Moulding, NP, and patient  Time spent on call:  11 minutes

## 2022-02-23 NOTE — Progress Notes (Signed)
Subjective:   Carmen Haney is a 89 y.o. female who presents for Medicare Annual (Subsequent) preventive examination.  Review of Systems     Cardiac Risk Factors include: advanced age (>28mn, >>33women);hypertension;sedentary lifestyle     Objective:    Today's Vitals   02/23/22 1530  PainSc: 0-No pain   There is no height or weight on file to calculate BMI.     02/10/2022    9:46 PM 02/10/2022    3:57 PM 02/05/2022    1:00 PM 11/27/2021    6:53 PM 11/26/2021   12:39 PM 02/15/2021   10:38 PM 11/25/2015   11:48 AM  Advanced Directives  Does Patient Have a Medical Advance Directive? Yes No Yes Yes No No No  Type of [place] will     Does patient want to make changes to medical advance directive? No - Patient declined No - Patient declined No - Patient declined No - Patient declined     Copy of H[place] Chart? No - copy requested        Would patient like information on creating a medical advance directive?       No - patient declined information    Current Medications (verified) Outpatient Encounter Medications as of 02/23/2022  Medication Sig   acetaminophen (TYLENOL) 500 MG tablet Take 1,000 mg by mouth in the morning and at bedtime.   apixaban (ELIQUIS) 5 MG TABS tablet Take 1 tablet (5 mg total) by mouth 2 (two) times daily.   brexpiprazole (REXULTI) 0.25 MG TABS tablet Take 1 tablet (0.25 mg total) by mouth daily.   Calcium-Phosphorus-Vitamin D (CALCIUM/D3 ADULT GUMMIES PO) Take 500 mg by mouth in the morning.   diazepam (VALIUM) 5 MG tablet Take 1 tablet 3-4 times a day   docusate sodium (COLACE) 100 MG capsule Take 1 capsule (100 mg total) by mouth 2 (two) times daily.   levothyroxine (SYNTHROID) 150 MCG tablet Take 150 mcg by mouth daily before breakfast.   metoprolol tartrate (LOPRESSOR) 25 MG tablet Take 0.5 tablets (12.5 mg  total) by mouth 2 (two) times daily.   Probiotic Product (PROBIOTIC GUMMIES PO) Take 2 tablets by mouth in the morning.   sodium chloride 1 g tablet Take 1 tablet (1 g total) by mouth 2 (two) times daily with a meal.   traMADol (ULTRAM) 50 MG tablet Take 1 tablet (50 mg total) by mouth every 8 (eight) hours as needed for moderate pain.   sertraline (ZOLOFT) 100 MG tablet Take 50-100 mg by mouth See admin instructions. Starting on 02/10/2022, taper off sertraline as follows: Take 100 mg by mouth daily at bedtime for 5 days, then 50 mg daily at bedtime for 5 days, then STOP   [DISCONTINUED] DULoxetine (CYMBALTA) 20 MG capsule Take 20 mg by mouth See admin instructions. Starting on 02/10/2022, take 20 mg by mouth once a day for 7 days   [DISCONTINUED] DULoxetine (CYMBALTA) 30 MG capsule Take 30 mg by mouth See admin instructions. Starting on 02/18/2022, take 30 mg by mouth once a day for 7 days (Patient not taking: Reported on 02/10/2022)   [DISCONTINUED] DULoxetine HCl 40 MG CPEP Take 40 mg by mouth See admin instructions. Starting on 02/19/2022, take 40 mg by mouth once a day (Patient not taking: Reported on 02/10/2022)   [DISCONTINUED] olmesartan (BENICAR) 20 MG tablet Take 20 mg by mouth daily.  No facility-administered encounter medications on file as of 02/23/2022.    Allergies (verified) Ace inhibitors and Latex   History: Past Medical History:  Diagnosis Date   ACE-inhibitor cough    Breast CA ([place]) 2010   s/p lumpectomy & radiation   CAD (coronary artery disease) 07/1998   s/p cath in 2000. L main with 30 to 40%   Chronic diastolic heart failure (HCC)    Hearing loss    HTN (hypertension)    Hyperlipidemia    Obesities, morbid (HCC)    Osteoporosis    Paroxysmal atrial fibrillation (HCC)    Personal history of radiation therapy 2010   Right Breast Cancer   Thyroid disease    Vertebral fracture, pathological    Past Surgical History:  Procedure Laterality Date   BREAST  BIOPSY Left    BREAST LUMPECTOMY Right March 2010   BREAST LUMPECTOMY WITH RADIOACTIVE SEED LOCALIZATION Left 09/20/2014   Procedure: LEFT BREAST LUMPECTOMY WITH RADIOACTIVE SEED LOCALIZATION;  Surgeon: Autumn Messing III, MD;  Location: [facility];  Service: General;  Laterality: Left;   CARPAL TUNNEL RELEASE Right    CESAREAN SECTION     EYE SURGERY     Right Eye   Skin Cancer Removed from Right Forearm     TOTAL ABDOMINAL HYSTERECTOMY     Family History  Problem Relation Age of Onset   Coronary artery disease Mother    Hypertension Mother    Melanoma Mother    Cancer Mother 36       breast   Breast cancer Mother    Cancer Father 61       prostate   Cancer Sister 11       breast   Breast cancer Sister    Cancer Maternal Aunt 40       breast   Cancer Sister 71       breast   Breast cancer Sister    Breast cancer Daughter 2   Social History   Socioeconomic History   Marital status: Widowed    Spouse name: Tressie Ellis   Number of children: Not on file   Years of education: Not on file   Highest education level: Not on file  Occupational History   Occupation: Retired from [employer]: RETIRED  Tobacco Use   Smoking status: Former    Types: Cigarettes    Quit date: 01/13/1963    Years since quitting: 59.1   Smokeless tobacco: Never   Tobacco comments:    stopped in 1970's  Vaping Use   Vaping Use: Never used  Substance and Sexual Activity   Alcohol use: No    Alcohol/week: 0.0 standard drinks of alcohol   Drug use: Never   Sexual activity: Not on file  Other Topics Concern   Not on file  Social History Narrative   Not on file   Social Determinants of Health   Financial Resource Strain: Low Risk  (02/23/2022)   Overall Financial Resource Strain (CARDIA)    Difficulty of Paying Living Expenses: Not hard at all  Food Insecurity: No [place] (02/23/2022)   Hunger Vital Sign    Worried About Running Out of Food in the Last Year: Never true     [place] in the Last Year: Never true  Transportation Needs: No Transportation Needs (02/23/2022)   PRAPARE - Hydrologist (Medical): No    Lack of Transportation (Non-Medical): No  Physical  Activity: Inactive (02/23/2022)   Exercise Vital Sign    Days of Exercise per Week: 0 days    Minutes of Exercise per Session: 0 min  Stress: Stress Concern Present (02/23/2022)   [place]    Feeling of Stress : Rather much  Social Connections: Socially Isolated (02/23/2022)   Social Connection and Isolation Panel [NHANES]    Frequency of Communication with Friends and Family: Three times a week    Frequency of Social Gatherings with Friends and Family: Twice a week    Attends Religious Services: Never    Marine scientist or Organizations: No    Attends Archivist Meetings: Never    Marital Status: Widowed    Tobacco Counseling Counseling given: Not Answered Tobacco comments: stopped in 1970's   Clinical Intake:  Pre-visit preparation completed: No  Pain : No/denies pain Pain Score: 0-No pain     Nutritional Risks: None Diabetes: No  How often do you need to have someone help you when you read instructions, pamphlets, or other written materials from your doctor or pharmacy?: 3 - Sometimes What is the last grade level you completed in school?: High school  Diabetic: No  Interpreter Needed?: No      Activities of Daily Living    02/23/2022    3:44 PM 02/10/2022    9:46 PM  In your present state of health, do you have any difficulty performing the following activities:  Hearing? 1 1  Vision? 0 0  Difficulty concentrating or making decisions? 0 0  Walking or climbing stairs? 1 1  Dressing or bathing? 1 1  Doing errands, shopping? 1 1  Preparing Food and eating ? Y   Using the Toilet? Y   In the past six months, have you accidently leaked urine? Y   Do you  have problems with loss of bowel control? Y   Managing your Medications? Y   Managing your Finances? Y   Housekeeping or managing your Housekeeping? Y     Patient Care Team: [name], NP as PCP - General (Adult Health Nurse Practitioner) Lelon Perla, MD as PCP - Cardiology (Cardiology)  Indicate any recent Medical Services you may have received from other than [facility] providers in the past year (date may be approximate).     Assessment:   This is a routine wellness examination for Aline.  Hearing/Vision screen Hearing Screening - Comments:: Patient has hearing aids and has hearing problems Vision Screening - Comments:: Patient wear reading glasses and have two laser surgical.  Dietary issues and exercise activities discussed: Current Exercise Habits: The patient does not participate in regular exercise at present, Exercise limited by: cardiac condition(s);neurologic condition(s)   Goals Addressed             This Visit's Progress    Activity and Exercise Increased   Not on track    Evidence-based guidance:  Review current exercise levels.  Assess patient perspective on exercise or activity level, barriers to increasing activity, motivation and readiness for change.  Recommend or set healthy exercise goal based on individual tolerance.  Encourage small steps toward making change in amount of exercise or activity.  Urge reduction of sedentary activities or screen time.  Promote group activities within the community or with family or support person.  Consider referral to rehabiliation therapist for assessment and exercise/activity plan.   Notes:        Depression Screen  02/23/2022    3:10 PM  PHQ 2/9 Scores  PHQ - 2 Score 0    Fall Risk    02/23/2022    3:43 PM 02/23/2022    3:09 PM 02/05/2022    1:00 PM  [place] in the past year? 0 0 1  Number falls in past yr: 0 0 0  Injury with Fall? 0 0 1  Risk for fall due to : History of fall(s);Impaired  balance/gait;Impaired mobility  History of fall(s)  Follow up Falls evaluation completed;Education provided;Falls prevention discussed  Falls evaluation completed    FALL RISK PREVENTION PERTAINING TO THE HOME:  Any stairs in or around the home? Yes  If so, are there any without handrails? Yes  Home free of loose throw rugs in walkways, pet beds, electrical cords, etc? No  Adequate lighting in your home to reduce risk of falls? Yes   ASSISTIVE DEVICES UTILIZED TO PREVENT FALLS:  Life alert? Yes  Use of a cane, walker or w/c? Yes  Grab bars in the bathroom? No  Shower chair or bench in shower? No  Elevated toilet seat or a handicapped toilet? Yes   TIMED UP AND GO:  Was the test performed? No .  Length of time to ambulate 10 feet: N/A sec.   Gait unsteady without use of assistive device, provider informed and interventions were implemented  Cognitive Function:        02/23/2022    3:10 PM  6CIT Screen  What Year? 0 points  What month? 0 points  What time? 0 points  Count back from 20 0 points  Months in reverse 0 points  Repeat phrase 2 points  Total Score 2 points    Immunizations Immunization History  Administered Date(s) Administered   Influenza Split 10/25/2009, 10/22/2010, 10/19/2011   Influenza,inj,Quad PF,6+ Mos 10/20/2012, 11/28/2013, 11/15/2014   Influenza,inj,quad, With Preservative 12/04/2015, 11/05/2016, 10/29/2017, 11/19/2018, 10/21/2019   Influenza-Unspecified 10/29/2017   Moderna Covid-19 Vaccine Bivalent Booster 50yr & up 11/25/2020   PFIZER(Purple Top)SARS-COV-2 Vaccination 01/29/2019, 02/18/2019, 10/28/2019   Pneumococcal Conjugate-13 04/11/2013   Pneumococcal Polysaccharide-23 07/04/1997   Td 01/12/2002   Tdap 08/29/2010   Zoster, Live 03/01/2012    TDAP status: Due, Education has been provided regarding the importance of this vaccine. Advised may receive this vaccine at local pharmacy or Health Dept. Aware to provide a copy of the  vaccination record if obtained from local pharmacy or Health Dept. Verbalized acceptance and understanding.  Flu Vaccine status: Up to date  Pneumococcal vaccine status: Up to date  Covid-19 vaccine status: Completed vaccines  Qualifies for Shingles Vaccine? Yes   Zostavax completed No   Shingrix Completed?: No.    Education has been provided regarding the importance of this vaccine. Patient has been advised to call insurance company to determine out of pocket expense if they have not yet received this vaccine. Advised may also receive vaccine at local pharmacy or Health Dept. Verbalized acceptance and understanding.  Screening Tests Health Maintenance  Topic Date Due   DTaP/Tdap/Td (3 - Td or Tdap) 08/28/2020   MAMMOGRAM  12/12/2020   INFLUENZA VACCINE  02/27/2022 (Originally 08/12/2021)   COVID-19 Vaccine (5 - 2023-24 season) 02/27/2022 (Originally 09/12/2021)   Zoster Vaccines- Shingrix (1 of 2) 02/27/2022 (Originally 07/05/1949)   Medicare Annual Wellness (AWV)  02/24/2023   Pneumonia Vaccine 89 Years old  Completed   DEXA SCAN  Completed   HPV VACCINES  Aged Out    Health Maintenance  Health Maintenance Due  Topic Date Due   DTaP/Tdap/Td (3 - Td or Tdap) 08/28/2020   MAMMOGRAM  12/12/2020    Colorectal cancer screening: No longer required.   Mammogram status: No longer required due to advanced age.  Bone Density status: Completed 2019. Results reflect: Bone density results: NORMAL. Repeat every 5 years.  Lung Cancer Screening: (Low Dose CT Chest recommended if Age 73-80 years, 30 pack-year currently smoking OR have quit w/in 15years.) does not qualify.   Lung Cancer Screening Referral: No  Additional Screening:  Hepatitis C Screening: does not qualify; Completed   Vision Screening: Recommended annual ophthalmology exams for early detection of glaucoma and other disorders of the eye. Is the patient up to date with their annual eye exam?  No  Who is the provider or  what is the name of the office in which the patient attends annual eye exams? Dr. Ellie Lunch If pt is not established with a provider, would they like to be referred to a provider to establish care? No .   Dental Screening: Recommended annual dental exams for proper oral hygiene  Community Resource Referral / Chronic Care Management: CRR required this visit?  No   CCM required this visit?  No      Plan:     I have personally reviewed and noted the following in the patient's chart:   Medical and social history Use of alcohol, tobacco or illicit drugs  Current medications and supplements including opioid prescriptions. Patient is not currently taking opioid prescriptions. Functional ability and status Nutritional status Physical activity Advanced directives List of other physicians Hospitalizations, surgeries, and ER visits in previous 12 months Vitals Screenings to include cognitive, depression, and falls Referrals and appointments  In addition, I have reviewed and discussed with patient certain preventive protocols, quality metrics, and best practice recommendations. A written personalized care plan for preventive services as well as general preventive health recommendations were provided to patient.     Virtual Visit   I connected with Frederik Schmidt via virtual visit and verified that I am speaking with the correct person using two identifiers.  [place] Patient: Leighana Patin Provider: [name], NP    I discussed the limitations, risks, security and privacy concerns of performing an evaluation and management service by telephone and the availability of in person appointments. I also discussed with the patient that there may be a patient responsible charge related to this service. The patient expressed understanding and agreed to proceed.   I discussed the assessment and treatment plan with the patient. The patient was provided an opportunity to ask  questions and all were answered. The patient agreed with the plan and demonstrated an understanding of the instructions.   The patient was advised to call back or seek an in-person evaluation if the symptoms worsen or if the condition fails to improve as anticipated.  I provided 24 minutes of face-to-face time during this encounter.  [place], [place] printed and mailed    02/23/2022   Nurse Notes: Discuss mammogram when patient is able to walk. Recommend Tdap vaccine next routine visit.

## 2022-02-23 NOTE — Patient Instructions (Addendum)
  [name] , Thank you for taking time to come for your Medicare Wellness Visit. I appreciate your ongoing commitment to your health goals. Please review the following plan we discussed and let me know if I can assist you in the future.   These are the goals we discussed:  Goals      Activity and Exercise Increased     Evidence-based guidance:  Review current exercise levels.  Assess patient perspective on exercise or activity level, barriers to increasing activity, motivation and readiness for change.  Recommend or set healthy exercise goal based on individual tolerance.  Encourage small steps toward making change in amount of exercise or activity.  Urge reduction of sedentary activities or screen time.  Promote group activities within the community or with family or support person.  Consider referral to rehabiliation therapist for assessment and exercise/activity plan.   Notes:         This is a list of the screening recommended for you and due dates:  Health Maintenance  Topic Date Due   DTaP/Tdap/Td vaccine (3 - Td or Tdap) 08/28/2020   Flu Shot  02/27/2022*   COVID-19 Vaccine (5 - 2023-24 season) 02/27/2022*   Zoster (Shingles) Vaccine (1 of 2) 02/27/2022*   Mammogram  09/11/2022*   Medicare Annual Wellness Visit  02/24/2023   Pneumonia Vaccine  Completed   DEXA scan (bone density measurement)  Completed   HPV Vaccine  Aged Out  *Topic was postponed. The date shown is not the original due date.   Discuss mammogram when patient is able to walk. Recommend Tdap vaccine next routine visit.   Consider [facility] for home visits with MD

## 2022-02-26 DIAGNOSIS — F331 Major depressive disorder, recurrent, moderate: Secondary | ICD-10-CM | POA: Diagnosis not present

## 2022-02-26 DIAGNOSIS — F411 Generalized anxiety disorder: Secondary | ICD-10-CM | POA: Diagnosis not present

## 2022-02-27 NOTE — Progress Notes (Signed)
Initial neurology clinic note  SERVICE DATE: 03/06/22  Reason for Evaluation: Consultation requested by [name], NP for an opinion regarding unsteady gait, muscle spasms, and stiffness. My final recommendations will be communicated back to the requesting physician by way of shared medical record or letter to requesting physician via [place] mail.  HPI: This is [name], a 89 y.o. female with a medical history of HTN, HLD, pAfib (on eliquis), CAD, hypothyroidism, osteoporosis, hearing loss, breast cancer (2010), OA, compression fractures of the spine (T12 and L3), depression, and anxiety who presents to neurology clinic with the chief complaint of unsteady gait, muscle spasms, and stiffness. The patient is accompanied by daughters, Wynelle Link and [name].  Patient's symptoms started some time back. She has had difficulty with balance and gait for about 5 years. About 1 year ago she was walking with a walker, but having difficulty getting to the bathroom quickly enough. She started having trouble getting up from bed and chair. Patient had a fall in 11/2021. She was having weakness of lower extremities about 3 weeks prior that seemed new. She got up and reached for the walker, her legs gave way and she fell. She had to go to the hospital. She had a fracture of right fibula, but still hold weight. She went to rehab. She was doing well. Prior to being discharged, she was starting to get weaker. She started having mild spasms. She was also fearful of walking.  Her spasms continued to worse and became very severe. The spasms included the trunk and legs. Daughter [name]) called patient's geriatrician on 02/10/22 stating that patient could not get up and having severe spasms on 02/09/22 and 911 was called. She is weak in the legs and cannot walk. Daughter was recommended to take patient to ED. Patient was admitted to [facility] from 02/10/22 to 02/14/22. While hospitalized inflammatory markers were elevated and GAD  abs were elevated to 160, raising concern for stiff person syndrome. Patient was started on diazepam 5 mg TID and given prednisone 40 mg for 4 days (for muscle strain). Per DC summary, pain and spasm had improved. She was discharged with home health PT/OT.   She had to have diazepam increased to 5 mg every 6-7 hours (QID). If she only takes it TID, she is in pain due to breakthrough. When she gets nervous or anxious, the spasms worsen. She also takes Tylenol twice daily as well.  It is so hard to move her body, that it is hard to get up in the morning or get ready for bed. She has difficulty rolling over at night. She is immobile when sleeping.  She has had severe OA and knee replacement was recommended, but patient declined.   MEDICATIONS:  Outpatient Encounter Medications as of 03/06/2022  Medication Sig   acetaminophen (TYLENOL) 500 MG tablet Take 1,000 mg by mouth in the morning and at bedtime.   apixaban (ELIQUIS) 5 MG TABS tablet Take 1 tablet (5 mg total) by mouth 2 (two) times daily.   brexpiprazole (REXULTI) 0.25 MG TABS tablet Take 1 tablet (0.25 mg total) by mouth daily.   Calcium-Phosphorus-Vitamin D (CALCIUM/D3 ADULT GUMMIES PO) Take 500 mg by mouth in the morning.   diazepam (VALIUM) 5 MG tablet Take 1 tablet 3-4 times a day   docusate sodium (COLACE) 100 MG capsule Take 1 capsule (100 mg total) by mouth 2 (two) times daily.   levothyroxine (SYNTHROID) 150 MCG tablet Take 150 mcg by mouth daily before  breakfast.   metoprolol tartrate (LOPRESSOR) 25 MG tablet Take 0.5 tablets (12.5 mg total) by mouth 2 (two) times daily.   Probiotic Product (PROBIOTIC GUMMIES PO) Take 2 tablets by mouth in the morning.   sertraline (ZOLOFT) 100 MG tablet Take 50-100 mg by mouth See admin instructions. Starting on 02/10/2022, taper off sertraline as follows: Take 100 mg by mouth daily at bedtime for 5 days, then 50 mg daily at bedtime for 5 days, then STOP   sodium chloride 1 g tablet Take 1 tablet  (1 g total) by mouth 2 (two) times daily with a meal.   traMADol (ULTRAM) 50 MG tablet Take 1 tablet (50 mg total) by mouth every 8 (eight) hours as needed for moderate pain.   [DISCONTINUED] olmesartan (BENICAR) 20 MG tablet Take 20 mg by mouth daily.     No facility-administered encounter medications on file as of 03/06/2022.    PAST MEDICAL HISTORY: Past Medical History:  Diagnosis Date   ACE-inhibitor cough    Breast CA ([facility]) 2010   s/p lumpectomy & radiation   CAD (coronary artery disease) 07/1998   s/p cath in 2000. L main with 30 to 40%   Chronic diastolic heart failure (HCC)    Hearing loss    HTN (hypertension)    Hyperlipidemia    Obesities, morbid (HCC)    Osteoporosis    Paroxysmal atrial fibrillation (HCC)    Personal history of radiation therapy 2010   Right Breast Cancer   Thyroid disease    Vertebral fracture, pathological     PAST SURGICAL HISTORY: Past Surgical History:  Procedure Laterality Date   BREAST BIOPSY Left    BREAST LUMPECTOMY Right March 2010   BREAST LUMPECTOMY WITH RADIOACTIVE SEED LOCALIZATION Left 09/20/2014   Procedure: LEFT BREAST LUMPECTOMY WITH RADIOACTIVE SEED LOCALIZATION;  Surgeon: Autumn Messing III, MD;  Location: [facility];  Service: General;  Laterality: Left;   CARPAL TUNNEL RELEASE Right    CESAREAN SECTION     EYE SURGERY     Right Eye   Skin Cancer Removed from Right Forearm     TOTAL ABDOMINAL HYSTERECTOMY      ALLERGIES: Allergies  Allergen Reactions   Ace Inhibitors Cough   Latex Rash    FAMILY HISTORY: Family History  Problem Relation Age of Onset   Coronary artery disease Mother    Hypertension Mother    Melanoma Mother    Cancer Mother 70       breast   Breast cancer Mother    Cancer Father 41       prostate   Cancer Sister 50       breast   Breast cancer Sister    Cancer Maternal Aunt 32       breast   Cancer Sister 50       breast   Breast cancer Sister    Breast cancer Daughter 104     SOCIAL HISTORY: Social History   Tobacco Use   Smoking status: Former    Types: Cigarettes    Quit date: 01/13/1963    Years since quitting: 59.1   Smokeless tobacco: Never   Tobacco comments:    stopped in 1970's  Vaping Use   Vaping Use: Never used  Substance Use Topics   Alcohol use: No    Alcohol/week: 0.0 standard drinks of alcohol   Drug use: Never   Social History   Social History Narrative   Are you right handed or  left handed? right   Are you currently employed ?    What is your current occupation?   Do you live at home alone?   Who lives with you? Daughter and husband   What type of home do you live in: 1 story or 2 story? two    Caffeine 2 cups a day     OBJECTIVE: PHYSICAL EXAM: BP 110/80   Pulse (!) 54   Ht '5\' 3"'$  (1.6 m)   Wt 194 lb (88 kg)   SpO2 94%   BMI 34.37 kg/m   She last had valium about 2 pm.  General: General appearance: Awake and alert. No distress. Cooperative with exam.  Skin: No obvious rash or jaundice. HEENT: Atraumatic. Anicteric. Lungs: Non-labored breathing on room air  Heart: Regular Abdomen: Soft, non tender. Extremities: Significant  Musculoskeletal: No obvious joint swelling. Psych: Affect appropriate.  Neurological: Mental Status: Alert. Speech fluent. No pseudobulbar affect Cranial Nerves: CNII: No RAPD. Visual fields grossly intact. CNIII, IV, VI: PERRL. No nystagmus. EOMI. CN V: Facial sensation intact bilaterally to fine touch. [place] clench strong. Jaw jerk negative. CN VII: Facial muscles symmetric and strong. No ptosis at rest. CN VIII: Hearing grossly intact bilaterally. CN IX: No hypophonia. CN X: Palate elevates symmetrically. CN XI: Full strength shoulder shrug bilaterally. CN XII: Tongue protrusion full and midline. No atrophy or fasciculations. No significant dysarthria Motor: Tone is normal in upper extremities, lower extremities difficult to assess due to significant edema. No  fasciculations in extremities. No atrophy.  Individual muscle group testing (MRC grade out of 5):  Movement     Neck flexion 5    Neck extension 5     Right Left   Shoulder abduction 5 5   Shoulder adduction 5 5   Elbow flexion 5 5   Elbow extension 5 5   Finger abduction - FDI 5 5   Finger abduction - ADM 5 5   Finger extension 5 5   Finger distal flexion - 2/'3 5 5   '$ Finger distal flexion - 4/'5 5 5   '$ Thumb flexion - FPL 5 5   Thumb abduction - APB 5 5    Hip flexion 3 4+   Hip extension 5 5   Hip adduction 5 5   Hip abduction 5 5   Knee extension 4+ 5   Knee flexion 4+ 5   Dorsiflexion 4+ 4+   Plantarflexion 5 5    Reflexes:  Right Left   Bicep 2+ 2+   Tricep 2+ 2+   BrRad 2+ 2+   Knee 2+ 2+   Ankle Trace Trace    Pathological Reflexes: Hoffman: Absent bilaterally Troemner: Absent bilaterally Head reaction reflex positive Sensation: Temperature and vibration sensation decreased in bilateral lower extremities Coordination: Intact finger-to- nose-finger bilaterally.  Gait: Patient not safe to ambulate.  Lab and Test Review: Internal labs: 11/2021: Normal or unremarkable: CBC, BMP, ESR, vit D, TSH CMP significant for albumin of 3.1  02/11/22: CRP: 16.3 ESR: 58 GAD ab: 160.3 CK: 132 CMP: hyponatremia (Na 128 - chronic), albumin 2.9 CBC unremarkable   Imaging: CT head wo contrast (02/10/22): FINDINGS: Brain: No intracranial hemorrhage, mass effect, or midline shift. Brain volume is normal for age. No hydrocephalus. The basilar cisterns are patent. Minimal basal gangliar mineralization is likely senescent. Mild periventricular and deep chronic small vessel ischemic change. No evidence of territorial infarct or acute ischemia. No extra-axial or intracranial fluid collection.   Vascular: No hyperdense vessel  or unexpected calcification.   Skull: No fracture or focal lesion.   Sinuses/Orbits: Chronic sphenoid sinusitis with cortical thickening and  fluid. No acute findings. No mastoid effusion. Bilateral cataract resection.   Other: None.   IMPRESSION: 1. No acute intracranial abnormality. 2. Mild chronic small vessel ischemic change. 3. Chronic sphenoid sinusitis.  MRI lumbar spine wo contrast (02/11/22): FINDINGS: Segmentation:  Standard.   Alignment: Hyperlordosis with mild L2-3 retrolisthesis and L5-S1, L4-5 anterolisthesis   Vertebrae: Remote compression fractures at T12 and L3. Minimal superior endplate edema at L3 is likely reactive. Height loss is greater at T12 and measures up to 50% anteriorly when compared to L1. No acute or subacute fracture. No aggressive bone lesion.   Conus medullaris and cauda equina: Conus extends to the L1-2 level. Conus and cauda equina appear normal.   Paraspinal and other soft tissues: Negative for perispinal mass or inflammation. Expansion and T2 hyperintensity in the lower right iliopsoas   Disc levels:   T12- L1: Disc space narrowing and ventral spurring. No neural impingement   L1-L2: Disc space narrowing and bulging. Negative facets. No neural impingement   L2-L3: Disc narrowing and bulging. Bilateral inferior foraminal protrusion. Negative facets. Mild spinal stenosis   L3-L4: Disc space narrowing with inferior foraminal bulging. Mild facet spurring. Mild spinal stenosis   L4-L5: Degenerative facet spurring. Circumferential disc bulging. Mild spinal stenosis   L5-S1:Degenerative facet spurring with mild anterolisthesis. Mild circumferential disc bulging. No neural compression.   IMPRESSION: 1. Strain and/or hemorrhage partially covered at the right iliopsoas, consider CT of the pelvis. 2. No acute lumbar fracture. Remote compression fractures at T12 and L3. 3. Generalized lumbar spine degeneration as described.  CT abdomen/pelvis w/ contrast (02/11/22): FINDINGS: Lower chest: No acute abnormality.   Hepatobiliary: No cholelithiasis or biliary dilatation is  noted. Multiple hepatic cysts are noted.   Pancreas: Unremarkable. No pancreatic ductal dilatation or surrounding inflammatory changes.   Spleen: Mild splenomegaly.   Adrenals/Urinary Tract: Adrenal glands appear normal. Nonobstructive right renal calculus is noted. Bilateral renal cysts are noted. No hydronephrosis or renal obstruction is noted. Urinary bladder is unremarkable.   Stomach/Bowel: Stomach is within normal limits. Appendix appears normal. No evidence of bowel wall thickening, distention, or inflammatory changes.   Vascular/Lymphatic: No significant vascular findings are present. No enlarged abdominal or pelvic lymph nodes.   Reproductive: Status post hysterectomy. No adnexal masses.   Other: No abdominal wall hernia or abnormality. No abdominopelvic ascites.   Musculoskeletal: Old T12 and L3 compression fractures are noted. No acute osseous abnormality is noted. The right iliopsoas muscle is diffusely enlarged with ill-defined margins suggesting inflammation, edema or injury. No definite evidence of hemorrhage is noted.   IMPRESSION: Right iliopsoas muscle is diffusely enlarged with ill-defined margins suggesting inflammation or edema or possibly injury. No definite hematoma is noted.   Nonobstructive right renal calculus. No hydronephrosis or renal obstruction.  CT lumbar spine wo contrast (11/26/21): FINDINGS: Segmentation: There are five lumbar type vertebral bodies. The last full intervertebral disc space is labeled L5-S1.   Alignment: Normal alignment of the lumbar vertebral bodies. The facets are also normally aligned.   Vertebrae: Diffuse and fairly marked osteoporosis. Mild superior endplate depression of L3 is stable since the prior radiographs. No acute lumbar spine compression fracture. There is a compression deformity of T12. I do not see this for certain on the prior lumbar spine plain films. This is likely an acute compression fracture.  No retropulsion or canal compromise.   Paraspinal  and other soft tissues: No significant paraspinal findings. I do not see an obvious paraspinal hematoma at T12.   Disc levels:   T12-L1: No significant findings.   L1-2: Diffuse annular bulge and mild facet disease contributing to mild bilateral lateral recess stenosis.   L2-3: Moderate facet disease and bulging annulus with mild spinal and bilateral lateral recess stenosis.   L3-4: Diffuse bulging annulus, facet disease and ligamentum flavum thickening contributing to mild to moderate spinal and bilateral lateral recess stenosis.   L4-5: Diffuse bulging degenerated annulus, facet disease and ligamentum flavum thickening contributing to moderately severe spinal and bilateral lateral recess stenosis. No significant foraminal stenosis.   L5-S1: Bulging degenerated annulus and severe facet disease with mild spinal and bilateral lateral recess stenosis. No foraminal stenosis.   IMPRESSION: 1. Acute compression fracture of T12 without retropulsion or canal compromise. 2. Diffuse and fairly marked osteoporosis. 3. Multilevel spinal and lateral recess stenosis as discussed above.   CT head and cervical spine wo contrast (11/26/21): FINDINGS: CT HEAD FINDINGS   Brain: No evidence of acute infarction, hemorrhage, hydrocephalus, extra-axial collection or mass lesion/mass effect.   Vascular: Calcific atherosclerosis.   Skull: No acute fracture.   Sinuses/Orbits: Clear sinuses.  No acute orbital findings.   Other: No mastoid effusions.   CT CERVICAL SPINE FINDINGS   Alignment: No substantial sagittal subluxation.   Skull base and vertebrae: No evidence of acute fracture.   Soft tissues and spinal canal: No prevertebral fluid or swelling. No visible canal hematoma.   Disc levels:  Moderate multilevel degenerative change.   Upper chest: Visualized lung apices are clear.   IMPRESSION: 1. No evidence of acute  intracranial abnormality. 2. No evidence of acute fracture or traumatic malalignment in the cervical spine.  ASSESSMENT: KAELI DEFOREST is a 89 y.o. female who presents for evaluation of muscle stiffness, spasms, and inability to ambulate. She has a relevant medical history of HTN, HLD, pAfib (on eliquis), CAD, hypothyroidism, osteoporosis, hearing loss, breast cancer (2010), OA, compression fractures of the spine (T12 and L3), depression, and anxiety. Her neurological examination is pertinent for bilateral lower extremity weakness (in setting of significant peripheral edema into thighs). Available diagnostic data is significant for GAD ab positive to 160.3. MRI lumbar spine showed compression fractures at T12 and L3 which are chronic. 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 She is currently getting relief with valium 3-4 times daily. She is still not very mobile, but is working with home therapy. Much of today's appointment was discussing Stiff Person's Syndrome and family expectations/dynamics.  PLAN: -Continue Valium 5 mg 3-4 times daily -Could consider baclofen, but in the setting of ?weakness of lower extremities, this could make patient weaker -Continue home PT/OT -Information and prognosis of SPS was discussed  -Return to clinic in 3 months  The impression above as well as the plan as outlined below were extensively discussed with the patient (in the company of daughters) who voiced understanding. All questions were answered to their satisfaction.  The patient was counseled on pertinent fall precautions per the printed material provided today, and as noted under the "Patient Instructions" section below.  When available, results of the above investigations and possible further recommendations will be communicated to the patient via telephone/MyChart. Patient to call office if not contacted after expected testing turnaround  time.   Total time spent reviewing records, interview, history/exam, documentation, and coordination of care on day of encounter:  48  min   Thank you for allowing me to participate in patient's care.  If I can answer any additional questions, I would be pleased to do so.  Kai Levins, MD   CC: [name], NP [phone] [address]  CC: Referring provider: [name], NP [facility] [address] [place],  [place] [address]

## 2022-03-02 DIAGNOSIS — M170 Bilateral primary osteoarthritis of knee: Secondary | ICD-10-CM | POA: Diagnosis not present

## 2022-03-02 DIAGNOSIS — I4891 Unspecified atrial fibrillation: Secondary | ICD-10-CM | POA: Diagnosis not present

## 2022-03-02 DIAGNOSIS — F419 Anxiety disorder, unspecified: Secondary | ICD-10-CM | POA: Diagnosis not present

## 2022-03-02 DIAGNOSIS — M810 Age-related osteoporosis without current pathological fracture: Secondary | ICD-10-CM | POA: Diagnosis not present

## 2022-03-02 DIAGNOSIS — I130 Hypertensive heart and chronic kidney disease with heart failure and stage 1 through stage 4 chronic kidney disease, or unspecified chronic kidney disease: Secondary | ICD-10-CM | POA: Diagnosis not present

## 2022-03-02 DIAGNOSIS — N183 Chronic kidney disease, stage 3 unspecified: Secondary | ICD-10-CM | POA: Diagnosis not present

## 2022-03-02 DIAGNOSIS — S82831D Other fracture of upper and lower end of right fibula, subsequent encounter for closed fracture with routine healing: Secondary | ICD-10-CM | POA: Diagnosis not present

## 2022-03-02 DIAGNOSIS — F32A Depression, unspecified: Secondary | ICD-10-CM | POA: Diagnosis not present

## 2022-03-02 DIAGNOSIS — I5030 Unspecified diastolic (congestive) heart failure: Secondary | ICD-10-CM | POA: Diagnosis not present

## 2022-03-03 DIAGNOSIS — F411 Generalized anxiety disorder: Secondary | ICD-10-CM | POA: Diagnosis not present

## 2022-03-03 DIAGNOSIS — F331 Major depressive disorder, recurrent, moderate: Secondary | ICD-10-CM | POA: Diagnosis not present

## 2022-03-04 ENCOUNTER — Encounter: Payer: Self-pay | Admitting: Neurology

## 2022-03-04 ENCOUNTER — Encounter: Payer: Self-pay | Admitting: Orthopedic Surgery

## 2022-03-04 DIAGNOSIS — M170 Bilateral primary osteoarthritis of knee: Secondary | ICD-10-CM | POA: Diagnosis not present

## 2022-03-04 DIAGNOSIS — S82831D Other fracture of upper and lower end of right fibula, subsequent encounter for closed fracture with routine healing: Secondary | ICD-10-CM | POA: Diagnosis not present

## 2022-03-04 DIAGNOSIS — M810 Age-related osteoporosis without current pathological fracture: Secondary | ICD-10-CM | POA: Diagnosis not present

## 2022-03-04 DIAGNOSIS — F32A Depression, unspecified: Secondary | ICD-10-CM | POA: Diagnosis not present

## 2022-03-04 DIAGNOSIS — I4891 Unspecified atrial fibrillation: Secondary | ICD-10-CM | POA: Diagnosis not present

## 2022-03-04 DIAGNOSIS — N183 Chronic kidney disease, stage 3 unspecified: Secondary | ICD-10-CM | POA: Diagnosis not present

## 2022-03-04 DIAGNOSIS — F419 Anxiety disorder, unspecified: Secondary | ICD-10-CM | POA: Diagnosis not present

## 2022-03-04 DIAGNOSIS — I5030 Unspecified diastolic (congestive) heart failure: Secondary | ICD-10-CM | POA: Diagnosis not present

## 2022-03-04 DIAGNOSIS — I130 Hypertensive heart and chronic kidney disease with heart failure and stage 1 through stage 4 chronic kidney disease, or unspecified chronic kidney disease: Secondary | ICD-10-CM | POA: Diagnosis not present

## 2022-03-04 NOTE — Telephone Encounter (Signed)
I responded to patients daughter. Fl2 is being prepped for review and completion when in office tomorrow

## 2022-03-05 ENCOUNTER — Ambulatory Visit: Payer: Medicare PPO | Admitting: Neurology

## 2022-03-05 ENCOUNTER — Telehealth: Payer: Self-pay

## 2022-03-05 DIAGNOSIS — F411 Generalized anxiety disorder: Secondary | ICD-10-CM | POA: Diagnosis not present

## 2022-03-05 DIAGNOSIS — F331 Major depressive disorder, recurrent, moderate: Secondary | ICD-10-CM | POA: Diagnosis not present

## 2022-03-05 NOTE — Telephone Encounter (Signed)
Called daughter and told her Dr. Karren Burly reply. She understood, just wanted to make him aware of circumstance.

## 2022-03-06 ENCOUNTER — Ambulatory Visit: Payer: Medicare PPO | Admitting: Neurology

## 2022-03-06 ENCOUNTER — Encounter: Payer: Self-pay | Admitting: Neurology

## 2022-03-06 VITALS — BP 110/80 | HR 54 | Ht 63.0 in | Wt 194.0 lb

## 2022-03-06 DIAGNOSIS — R609 Edema, unspecified: Secondary | ICD-10-CM | POA: Diagnosis not present

## 2022-03-06 DIAGNOSIS — M6289 Other specified disorders of muscle: Secondary | ICD-10-CM | POA: Diagnosis not present

## 2022-03-06 DIAGNOSIS — G2582 Stiff-man syndrome: Secondary | ICD-10-CM

## 2022-03-06 DIAGNOSIS — R760 Raised antibody titer: Secondary | ICD-10-CM | POA: Diagnosis not present

## 2022-03-06 DIAGNOSIS — R29898 Other symptoms and signs involving the musculoskeletal system: Secondary | ICD-10-CM | POA: Diagnosis not present

## 2022-03-06 NOTE — Patient Instructions (Addendum)
I am giving you information today about Stiff Person's Syndrome.  Continue Valium 5 mg 3-4 times daily.  I would to see you again in clinic in 3 months.  Please let me know if you have any questions or concerns in the meantime.  The physicians and staff at [facility] are committed to providing excellent care. You may receive a survey requesting feedback about your experience at our office. We strive to receive "very good" responses to the survey questions. If you feel that your experience would prevent you from giving the office a "very good " response, please contact our office to try to remedy the situation. We may be reached at [phone]. Thank you for taking the time out of your busy day to complete the survey.  Kai Levins, MD [facility]  Preventing Falls at [facility] are common, often dreaded events in the lives of older people. Aside from the obvious injuries and even death that may result, fall can cause wide-ranging consequences including loss of independence, mental decline, decreased activity and mobility. Younger people are also at risk of falling, especially those with chronic illnesses and fatigue.  Ways to reduce risk for falling Examine diet and medications. Warm foods and alcohol dilate blood vessels, which can lead to dizziness when standing. Sleep aids, antidepressants and pain medications can also increase the likelihood of a fall.  Get a vision exam. Poor vision, cataracts and glaucoma increase the chances of falling.  Check foot gear. Shoes should fit snugly and have a sturdy, nonskid sole and a broad, low heel  Participate in a physician-approved exercise program to build and maintain muscle strength and improve balance and coordination. Programs that use ankle weights or stretch bands are excellent for muscle-strengthening. Water aerobics programs and low-impact Tai Chi programs have also been shown to improve balance and coordination.  Increase  vitamin D intake. Vitamin D improves muscle strength and increases the amount of calcium the body is able to absorb and deposit in bones.  How to prevent falls from common hazards Floors - Remove all loose wires, cords, and throw rugs. Minimize clutter. Make sure rugs are anchored and smooth. Keep furniture in its usual place.  Chairs -- Use chairs with straight backs, armrests and firm seats. Add firm cushions to existing pieces to add height.  Bathroom - Install grab bars and non-skid tape in the tub or shower. Use a bathtub transfer bench or a shower chair with a back support Use an elevated toilet seat and/or safety rails to assist standing from a low surface. Do not use towel racks or bathroom tissue holders to help you stand.  Lighting - Make sure halls, stairways, and entrances are well-lit. Install a night light in your bathroom or hallway. Make sure there is a light switch at the top and bottom of the staircase. Turn lights on if you get up in the middle of the night. Make sure lamps or light switches are within reach of the bed if you have to get up during the night.  Kitchen - Install non-skid rubber mats near the sink and stove. Clean spills immediately. Store frequently used utensils, pots, pans between waist and eye level. This helps prevent reaching and bending. Sit when getting things out of lower cupboards.  Living room/ Bedrooms - Place furniture with wide spaces in between, giving enough room to move around. Establish a route through the living room that gives you something to hold onto as you walk.  Stairs -  Make sure treads, rails, and rugs are secure. Install a rail on both sides of the stairs. If stairs are a threat, it might be helpful to arrange most of your activities on the lower level to reduce the number of times you must climb the stairs.  Entrances and doorways - Install metal handles on the walls adjacent to the doorknobs of all doors to make it more secure as you  travel through the doorway.  Tips for maintaining balance Keep at least one hand free at all times. Try using a backpack or fanny pack to hold things rather than carrying them in your hands. Never carry objects in both hands when walking as this interferes with keeping your balance.  Attempt to swing both arms from front to back while walking. This might require a conscious effort if Parkinson's disease has diminished your movement. It will, however, help you to maintain balance and posture, and reduce fatigue.  Consciously lift your feet off of the ground when walking. Shuffling and dragging of the feet is a common culprit in losing your balance.  When trying to navigate turns, use a "U" technique of facing forward and making a wide turn, rather than pivoting sharply.  Try to stand with your feet shoulder-length apart. When your feet are close together for any length of time, you increase your risk of losing your balance and falling.  Do one thing at a time. Don't try to walk and accomplish another task, such as reading or looking around. The decrease in your automatic reflexes complicates motor function, so the less distraction, the better.  Do not wear rubber or gripping soled shoes, they might "catch" on the floor and cause tripping.  Move slowly when changing positions. Use deliberate, concentrated movements and, if needed, use a grab bar or walking aid. Count 15 seconds between each movement. For example, when rising from a seated position, wait 15 seconds after standing to begin walking.  If balance is a continuous problem, you might want to consider a walking aid such as a cane, walking stick, or walker. Once you've mastered walking with help, you might be ready to try it on your own again.

## 2022-03-07 IMAGING — MG MM DIGITAL DIAGNOSTIC UNILAT*R* W/ TOMO W/ CAD
6 series · 6 of 14 positions shown · non-contrast
Comparison: Previous exam(s).

CLINICAL DATA: 88-year-old female for six-month follow-up of RIGHT
breast calcifications.History of RIGHT breast cancer and lumpectomy
in 3383.

EXAM:
DIGITAL DIAGNOSTIC UNILATERAL RIGHT MAMMOGRAM WITH CAD AND TOMO

[R CC]
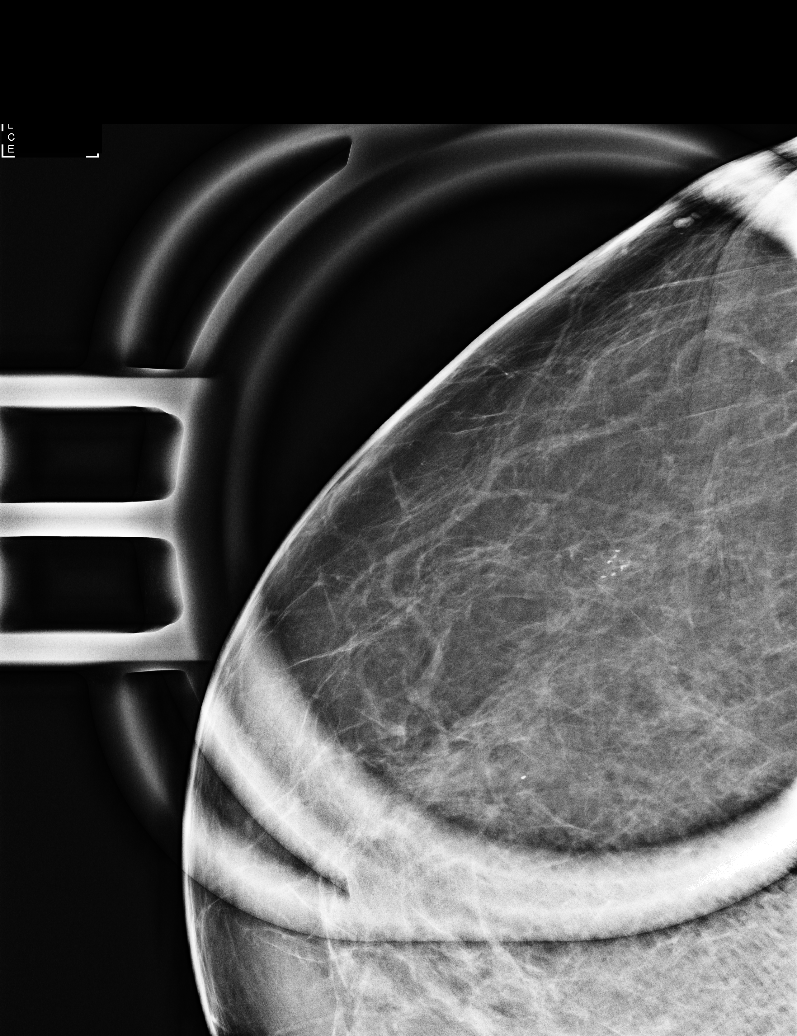

[R ML]
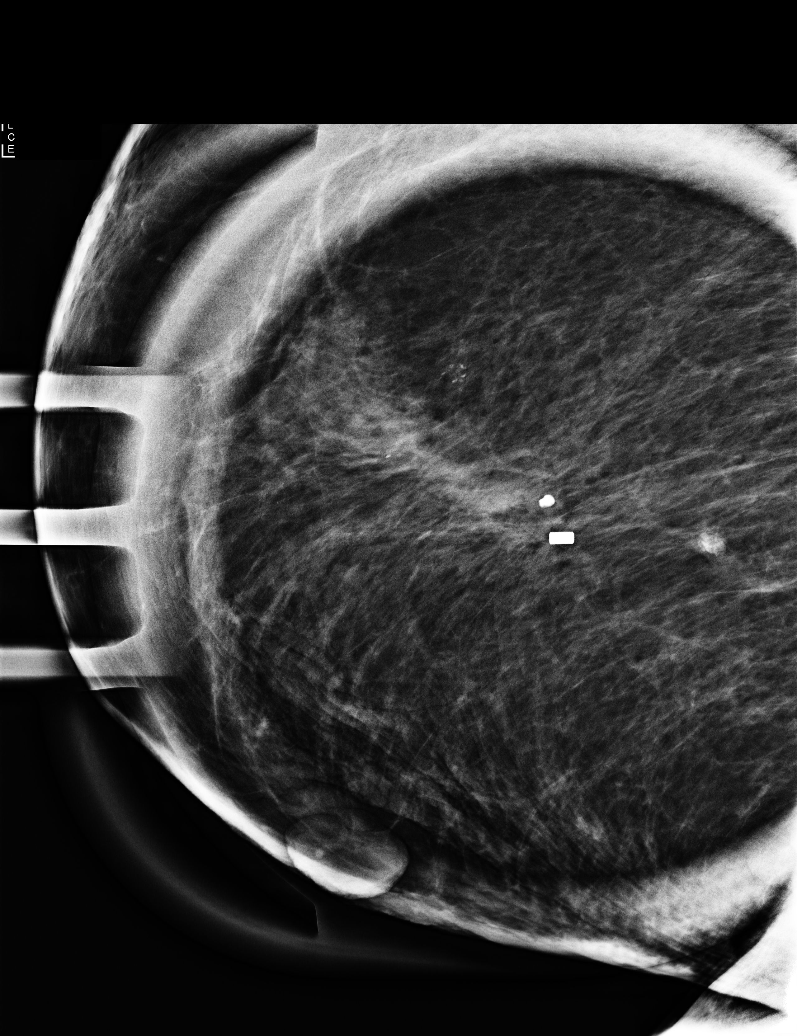

[R MLO synth-2D]
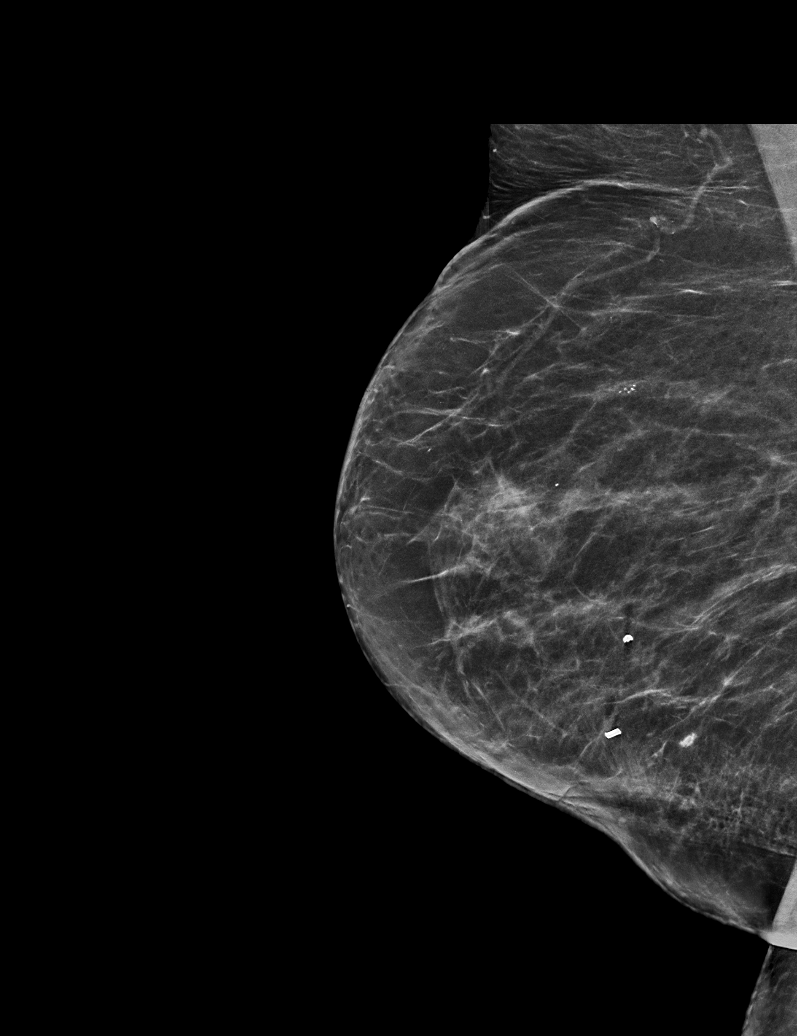

[R CC synth-2D]
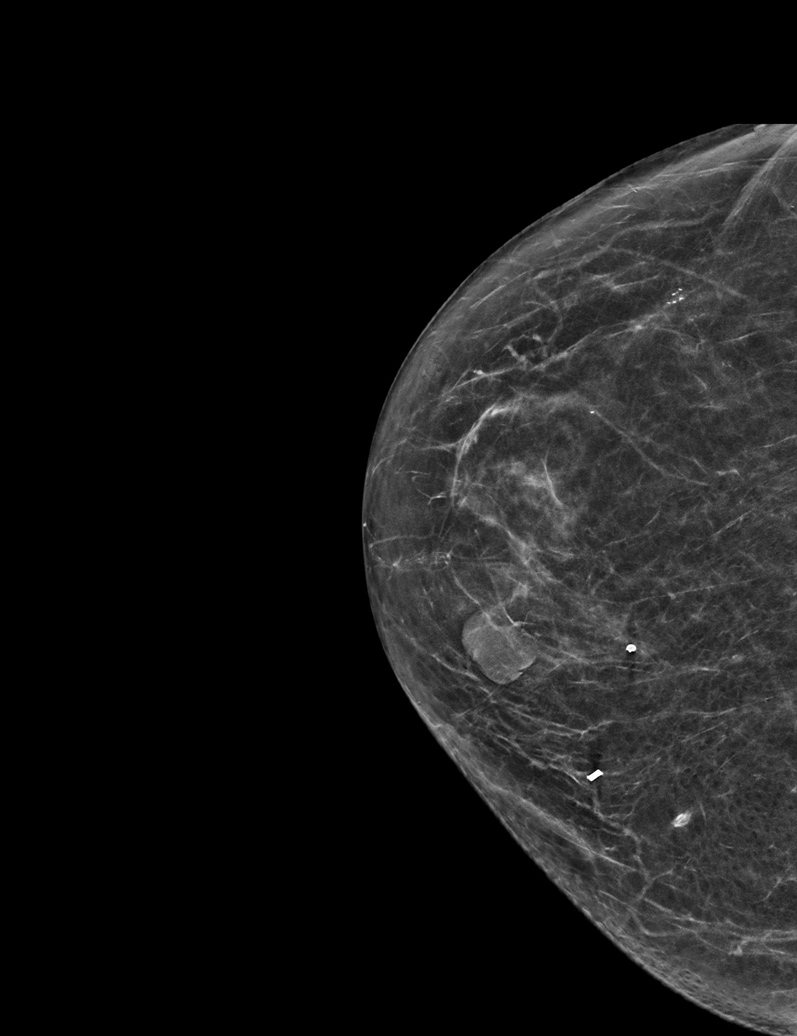

[R CC tomo · tomo slice 29/58.0]
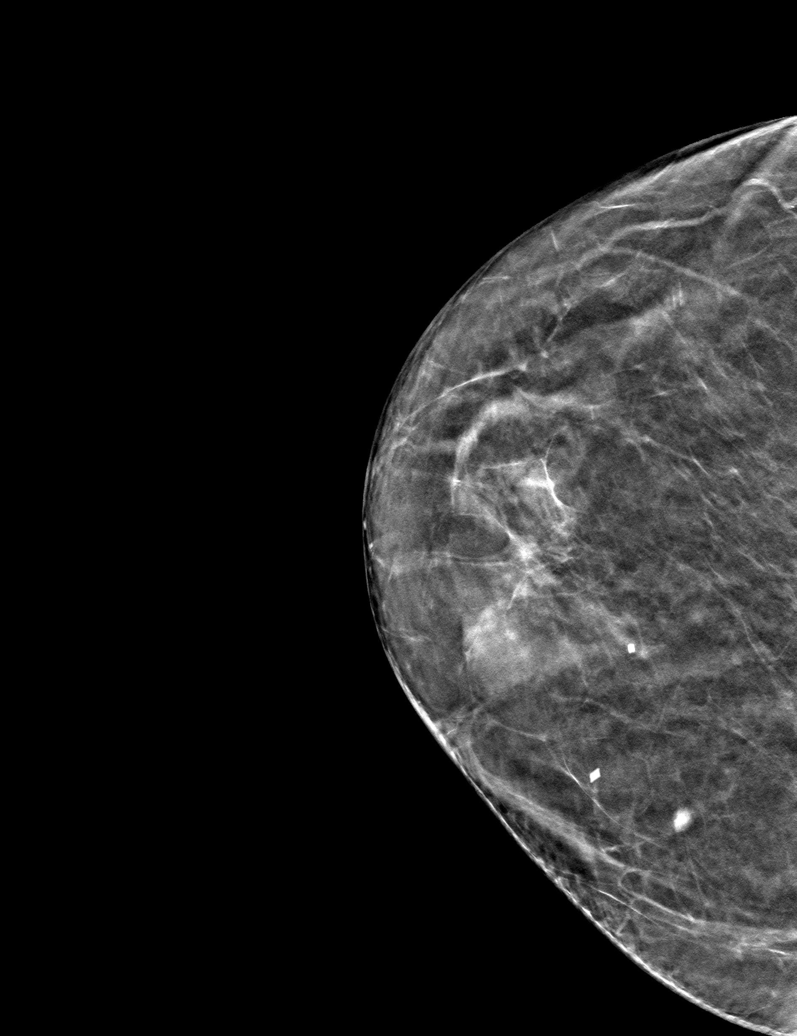

[R MLO tomo · tomo slice 34/67.0]
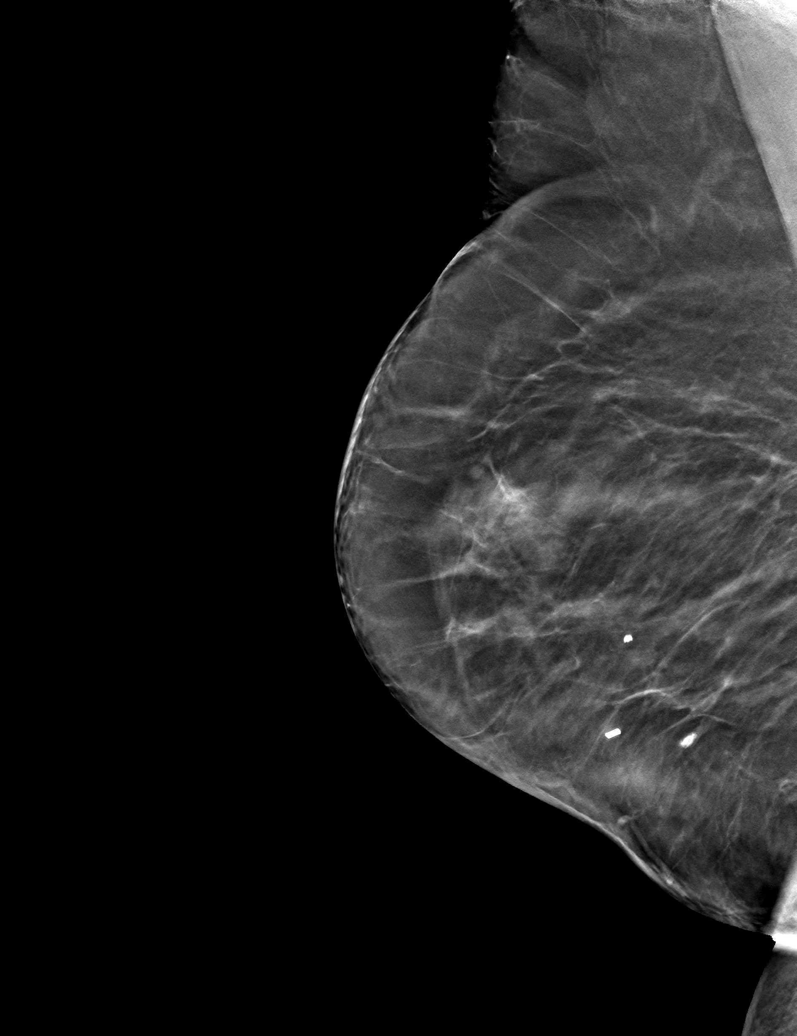

[6 of 14 positions shown; findings below may reference images not displayed]

ACR Breast Density Category b: There are scattered areas of
fibroglandular density.
FINDINGS: 2D/3D full field views and magnification views of the RIGHT breast
demonstrate a stable 0.4 cm group of calcifications within the
UPPER-OUTER RIGHT breast.

No new calcifications, nonsurgical distortion or suspicious mass
identified.

Mammographic images were processed with CAD.
IMPRESSION: Stable likely benign calcifications within the UPPER-OUTER RIGHT
breast. Six-month follow-up recommended to ensure 1 year stability.

RECOMMENDATION:
Bilateral diagnostic mammogram with magnification views of the RIGHT
breast in 6 months.

I have discussed the findings and recommendations with the patient.
If applicable, a reminder letter will be sent to the patient
regarding the next appointment.

BI-RADS CATEGORY  3: Probably benign.

## 2022-03-09 ENCOUNTER — Encounter: Payer: Self-pay | Admitting: Orthopedic Surgery

## 2022-03-09 NOTE — Telephone Encounter (Signed)
Message routed to PCP [name], NP

## 2022-03-10 ENCOUNTER — Ambulatory Visit: Payer: Medicare PPO | Admitting: Podiatry

## 2022-03-12 ENCOUNTER — Encounter: Payer: Medicare PPO | Admitting: Orthopedic Surgery

## 2022-03-12 ENCOUNTER — Telehealth: Payer: Self-pay | Admitting: Orthopedic Surgery

## 2022-03-12 DIAGNOSIS — N3946 Mixed incontinence: Secondary | ICD-10-CM | POA: Diagnosis not present

## 2022-03-12 DIAGNOSIS — R338 Other retention of urine: Secondary | ICD-10-CM | POA: Diagnosis not present

## 2022-03-12 NOTE — Telephone Encounter (Signed)
Patient refused in person visit today. Daughter, Carmen Haney with concerns of constipation. She has not had normal bowel movement x 3 weeks. Discussed severity of situation. Advised to try miralax BID. She is also refusing basic hygiene and decisions she would not normally. She does have a foley in place. Scheduled to see urology tomorrow. Advised them to test to r/o UTI. If no improvement, advised to report to ED. Video visit scheduled 03/16/2022.

## 2022-03-13 ENCOUNTER — Other Ambulatory Visit: Payer: Self-pay

## 2022-03-13 ENCOUNTER — Encounter: Payer: Self-pay | Admitting: Orthopedic Surgery

## 2022-03-13 ENCOUNTER — Encounter: Payer: Self-pay | Admitting: Neurology

## 2022-03-13 DIAGNOSIS — S82831D Other fracture of upper and lower end of right fibula, subsequent encounter for closed fracture with routine healing: Secondary | ICD-10-CM | POA: Diagnosis not present

## 2022-03-13 DIAGNOSIS — I4891 Unspecified atrial fibrillation: Secondary | ICD-10-CM | POA: Diagnosis not present

## 2022-03-13 DIAGNOSIS — M170 Bilateral primary osteoarthritis of knee: Secondary | ICD-10-CM | POA: Diagnosis not present

## 2022-03-13 DIAGNOSIS — M810 Age-related osteoporosis without current pathological fracture: Secondary | ICD-10-CM | POA: Diagnosis not present

## 2022-03-13 DIAGNOSIS — N183 Chronic kidney disease, stage 3 unspecified: Secondary | ICD-10-CM | POA: Diagnosis not present

## 2022-03-13 DIAGNOSIS — I130 Hypertensive heart and chronic kidney disease with heart failure and stage 1 through stage 4 chronic kidney disease, or unspecified chronic kidney disease: Secondary | ICD-10-CM | POA: Diagnosis not present

## 2022-03-13 DIAGNOSIS — F32A Depression, unspecified: Secondary | ICD-10-CM | POA: Diagnosis not present

## 2022-03-13 DIAGNOSIS — I5030 Unspecified diastolic (congestive) heart failure: Secondary | ICD-10-CM | POA: Diagnosis not present

## 2022-03-13 DIAGNOSIS — R338 Other retention of urine: Secondary | ICD-10-CM | POA: Diagnosis not present

## 2022-03-13 DIAGNOSIS — F419 Anxiety disorder, unspecified: Secondary | ICD-10-CM | POA: Diagnosis not present

## 2022-03-13 MED ORDER — LEVOTHYROXINE SODIUM 150 MCG PO TABS: 150.0000 ug | ORAL_TABLET | Freq: Every day | ORAL | 1 refills | Status: DC

## 2022-03-13 NOTE — Telephone Encounter (Signed)
Message routed to PCP [name], NP

## 2022-03-13 NOTE — Progress Notes (Signed)
This encounter was created in error - please disregard.

## 2022-03-14 ENCOUNTER — Other Ambulatory Visit: Payer: Self-pay

## 2022-03-14 ENCOUNTER — Emergency Department (HOSPITAL_COMMUNITY): Payer: Medicare PPO

## 2022-03-14 ENCOUNTER — Encounter (HOSPITAL_COMMUNITY): Payer: Self-pay

## 2022-03-14 ENCOUNTER — Inpatient Hospital Stay (HOSPITAL_COMMUNITY)
Admission: EM | Admit: 2022-03-14 | Discharge: 2022-03-19 | DRG: 699 | Disposition: A | Discharge status: Skilled Nursing Facility | Payer: Medicare PPO | Attending: Family Medicine | Admitting: Family Medicine

## 2022-03-14 DIAGNOSIS — Z7901 Long term (current) use of anticoagulants: Secondary | ICD-10-CM

## 2022-03-14 DIAGNOSIS — Z90710 Acquired absence of both cervix and uterus: Secondary | ICD-10-CM | POA: Diagnosis not present

## 2022-03-14 DIAGNOSIS — I110 Hypertensive heart disease with heart failure: Secondary | ICD-10-CM | POA: Diagnosis present

## 2022-03-14 DIAGNOSIS — Z8249 Family history of ischemic heart disease and other diseases of the circulatory system: Secondary | ICD-10-CM

## 2022-03-14 DIAGNOSIS — I5032 Chronic diastolic (congestive) heart failure: Secondary | ICD-10-CM | POA: Diagnosis present

## 2022-03-14 DIAGNOSIS — Z1152 Encounter for screening for COVID-19: Secondary | ICD-10-CM

## 2022-03-14 DIAGNOSIS — T83511A Infection and inflammatory reaction due to indwelling urethral catheter, initial encounter: Secondary | ICD-10-CM | POA: Diagnosis present

## 2022-03-14 DIAGNOSIS — Z79899 Other long term (current) drug therapy: Secondary | ICD-10-CM

## 2022-03-14 DIAGNOSIS — G2582 Stiff-man syndrome: Secondary | ICD-10-CM | POA: Diagnosis present

## 2022-03-14 DIAGNOSIS — Z7401 Bed confinement status: Secondary | ICD-10-CM | POA: Diagnosis not present

## 2022-03-14 DIAGNOSIS — E669 Obesity, unspecified: Secondary | ICD-10-CM | POA: Diagnosis present

## 2022-03-14 DIAGNOSIS — Z853 Personal history of malignant neoplasm of breast: Secondary | ICD-10-CM

## 2022-03-14 DIAGNOSIS — E785 Hyperlipidemia, unspecified: Secondary | ICD-10-CM | POA: Diagnosis present

## 2022-03-14 DIAGNOSIS — I10 Essential (primary) hypertension: Secondary | ICD-10-CM | POA: Diagnosis not present

## 2022-03-14 DIAGNOSIS — Z923 Personal history of irradiation: Secondary | ICD-10-CM

## 2022-03-14 DIAGNOSIS — Z87891 Personal history of nicotine dependence: Secondary | ICD-10-CM

## 2022-03-14 DIAGNOSIS — H9190 Unspecified hearing loss, unspecified ear: Secondary | ICD-10-CM | POA: Diagnosis present

## 2022-03-14 DIAGNOSIS — I4891 Unspecified atrial fibrillation: Secondary | ICD-10-CM | POA: Diagnosis not present

## 2022-03-14 DIAGNOSIS — Z803 Family history of malignant neoplasm of breast: Secondary | ICD-10-CM

## 2022-03-14 DIAGNOSIS — Z91040 Latex allergy status: Secondary | ICD-10-CM | POA: Diagnosis not present

## 2022-03-14 DIAGNOSIS — F419 Anxiety disorder, unspecified: Secondary | ICD-10-CM | POA: Diagnosis present

## 2022-03-14 DIAGNOSIS — R760 Raised antibody titer: Secondary | ICD-10-CM | POA: Diagnosis present

## 2022-03-14 DIAGNOSIS — Z85828 Personal history of other malignant neoplasm of skin: Secondary | ICD-10-CM

## 2022-03-14 DIAGNOSIS — R5381 Other malaise: Secondary | ICD-10-CM | POA: Diagnosis not present

## 2022-03-14 DIAGNOSIS — I2510 Atherosclerotic heart disease of native coronary artery without angina pectoris: Secondary | ICD-10-CM | POA: Diagnosis present

## 2022-03-14 DIAGNOSIS — Z743 Need for continuous supervision: Secondary | ICD-10-CM | POA: Diagnosis not present

## 2022-03-14 DIAGNOSIS — Z6835 Body mass index (BMI) 35.0-35.9, adult: Secondary | ICD-10-CM

## 2022-03-14 DIAGNOSIS — B9620 Unspecified Escherichia coli [E. coli] as the cause of diseases classified elsewhere: Secondary | ICD-10-CM | POA: Diagnosis present

## 2022-03-14 DIAGNOSIS — Z1611 Resistance to penicillins: Secondary | ICD-10-CM | POA: Diagnosis present

## 2022-03-14 DIAGNOSIS — R627 Adult failure to thrive: Secondary | ICD-10-CM | POA: Diagnosis present

## 2022-03-14 DIAGNOSIS — Z888 Allergy status to other drugs, medicaments and biological substances status: Secondary | ICD-10-CM | POA: Diagnosis not present

## 2022-03-14 DIAGNOSIS — I480 Paroxysmal atrial fibrillation: Secondary | ICD-10-CM | POA: Diagnosis present

## 2022-03-14 DIAGNOSIS — M6281 Muscle weakness (generalized): Secondary | ICD-10-CM | POA: Diagnosis not present

## 2022-03-14 DIAGNOSIS — R064 Hyperventilation: Secondary | ICD-10-CM | POA: Diagnosis not present

## 2022-03-14 DIAGNOSIS — R531 Weakness: Secondary | ICD-10-CM | POA: Diagnosis not present

## 2022-03-14 DIAGNOSIS — Z79890 Hormone replacement therapy (postmenopausal): Secondary | ICD-10-CM

## 2022-03-14 DIAGNOSIS — N390 Urinary tract infection, site not specified: Secondary | ICD-10-CM | POA: Diagnosis present

## 2022-03-14 DIAGNOSIS — R457 State of emotional shock and stress, unspecified: Secondary | ICD-10-CM | POA: Diagnosis not present

## 2022-03-14 DIAGNOSIS — L0390 Cellulitis, unspecified: Secondary | ICD-10-CM | POA: Diagnosis present

## 2022-03-14 DIAGNOSIS — Z808 Family history of malignant neoplasm of other organs or systems: Secondary | ICD-10-CM

## 2022-03-14 DIAGNOSIS — L03119 Cellulitis of unspecified part of limb: Secondary | ICD-10-CM | POA: Diagnosis not present

## 2022-03-14 DIAGNOSIS — I872 Venous insufficiency (chronic) (peripheral): Secondary | ICD-10-CM | POA: Diagnosis present

## 2022-03-14 DIAGNOSIS — E039 Hypothyroidism, unspecified: Secondary | ICD-10-CM | POA: Diagnosis not present

## 2022-03-14 DIAGNOSIS — E860 Dehydration: Secondary | ICD-10-CM | POA: Diagnosis present

## 2022-03-14 DIAGNOSIS — F32A Depression, unspecified: Secondary | ICD-10-CM | POA: Diagnosis not present

## 2022-03-14 DIAGNOSIS — R278 Other lack of coordination: Secondary | ICD-10-CM | POA: Diagnosis not present

## 2022-03-14 DIAGNOSIS — R29898 Other symptoms and signs involving the musculoskeletal system: Secondary | ICD-10-CM | POA: Diagnosis not present

## 2022-03-14 LAB — CBC WITH DIFFERENTIAL/PLATELET
Abs Immature Granulocytes: 0.02 10*3/uL (ref 0.00–0.07)
Basophils Absolute: 0 10*3/uL (ref 0.0–0.1)
Basophils Relative: 1 %
Eosinophils Absolute: 0.2 10*3/uL (ref 0.0–0.5)
Eosinophils Relative: 3 %
HCT: 37.1 % (ref 36.0–46.0)
Hemoglobin: 11.8 g/dL — ABNORMAL LOW (ref 12.0–15.0)
Immature Granulocytes: 0 %
Lymphocytes Relative: 21 %
Lymphs Abs: 1.1 10*3/uL (ref 0.7–4.0)
MCH: 30.2 pg (ref 26.0–34.0)
MCHC: 31.8 g/dL (ref 30.0–36.0)
MCV: 94.9 fL (ref 80.0–100.0)
Monocytes Absolute: 0.6 10*3/uL (ref 0.1–1.0)
Monocytes Relative: 12 %
Neutro Abs: 3.1 10*3/uL (ref 1.7–7.7)
Neutrophils Relative %: 63 %
Platelets: 283 10*3/uL (ref 150–400)
RBC: 3.91 MIL/uL (ref 3.87–5.11)
RDW: 15 % (ref 11.5–15.5)
WBC: 5 10*3/uL (ref 4.0–10.5)
nRBC: 0 % (ref 0.0–0.2)

## 2022-03-14 LAB — URINALYSIS, ROUTINE W REFLEX MICROSCOPIC
Bilirubin Urine: NEGATIVE
Glucose, UA: NEGATIVE mg/dL
Ketones, ur: NEGATIVE mg/dL
Nitrite: POSITIVE — AB
Protein, ur: NEGATIVE mg/dL
Specific Gravity, Urine: 1.009 (ref 1.005–1.030)
WBC, UA: >50 WBC/hpf (ref 0–5)
pH: 6 (ref 5.0–8.0)

## 2022-03-14 LAB — COMPREHENSIVE METABOLIC PANEL
ALT: 14 U/L (ref 0–44)
AST: 18 U/L (ref 15–41)
Albumin: 3.4 g/dL — ABNORMAL LOW (ref 3.5–5.0)
Alkaline Phosphatase: 81 U/L (ref 38–126)
Anion gap: 9 (ref 5–15)
BUN: 12 mg/dL (ref 8–23)
CO2: 32 mmol/L (ref 22–32)
Calcium: 8.7 mg/dL — ABNORMAL LOW (ref 8.9–10.3)
Chloride: 96 mmol/L — ABNORMAL LOW (ref 98–111)
Creatinine, Ser: 0.74 mg/dL (ref 0.44–1.00)
GFR, Estimated: >60 mL/min (ref >60)
Glucose, Bld: 131 mg/dL — ABNORMAL HIGH (ref 70–99)
Potassium: 3.9 mmol/L (ref 3.5–5.1)
Sodium: 137 mmol/L (ref 135–145)
Total Bilirubin: 0.5 mg/dL (ref 0.3–1.2)
Total Protein: 6.6 g/dL (ref 6.5–8.1)

## 2022-03-14 LAB — LACTIC ACID, PLASMA: Lactic Acid, Venous: 1 mmol/L (ref 0.5–1.9)

## 2022-03-14 MED ORDER — DIAZEPAM 5 MG PO TABS
5.0000 mg | ORAL_TABLET | Freq: Once | ORAL | Status: AC
  Administered 2022-03-14: 5 mg via ORAL
  Filled 2022-03-14: qty 1

## 2022-03-14 MED ORDER — SODIUM CHLORIDE 0.9 % IV SOLN
1.0000 g | Freq: Once | INTRAVENOUS | Status: AC
  Administered 2022-03-14: 1 g via INTRAVENOUS
  Filled 2022-03-14: qty 10

## 2022-03-14 NOTE — ED Triage Notes (Signed)
Pt arrived via EMS From home, for anxiety and pain manage,  Pt on Valium not working per EMS. EMS vitals 142/78 BP 98 pulse 97% 18rr 24rr 4/10 pain

## 2022-03-14 NOTE — ED Provider Notes (Signed)
[facility] Provider Note   CSN: [id number] Arrival date & time: 03/14/22  1924     History  Chief Complaint  Patient presents with   Anxiety    Carmen Haney is a 89 y.o. female.  89 year old female with prior medical history as detailed below presents for evaluation.  Patient is accompanied by her daughter.  Daughter provides majority of history.  Patient's daughter lives with the patient and is the primary caregiver.  Patient with history of stiff person syndrome.  Per daughter, patient with increasing difficulty transferring and assisting with transferring.  Patient's daughter has had several incidents over the last several days where she cannot safely lift and/or move her mother.  The daughter is concerned that she can no longer provide adequate and safe care for the patient at home.  The daughter is requesting medical evaluation this evening and if no acute issues are identified then to obtain assistance with placement.   The history is provided by the patient and medical records.       Home Medications Prior to Admission medications   Medication Sig Start Date End Date Taking? Authorizing Provider  acetaminophen (TYLENOL) 500 MG tablet Take 1,000 mg by mouth in the morning and at bedtime.    [provider]  apixaban (ELIQUIS) 5 MG TABS tablet Take 1 tablet (5 mg total) by mouth 2 (two) times daily. 02/16/21   Duke, Tami Lin, PA  brexpiprazole (REXULTI) 0.25 MG TABS tablet Take 1 tablet (0.25 mg total) by mouth daily. Patient taking differently: Take by mouth daily. 1/2 tablet a day 02/05/22   Fargo, Amy E, NP  Calcium-Phosphorus-Vitamin D (CALCIUM/D3 ADULT GUMMIES PO) Take 500 mg by mouth in the morning.    [provider]  diazepam (VALIUM) 5 MG tablet Take 1 tablet 3-4 times a day Patient taking differently: 5 mg every 6 (six) hours. Take 1 tablet 3-4 times a day 02/14/22   Mercy Riding, MD  docusate  sodium (COLACE) 100 MG capsule Take 1 capsule (100 mg total) by mouth 2 (two) times daily. Patient taking differently: Take 100 mg by mouth daily. 12/01/21   Lavina Hamman, MD  DULoxetine HCl 40 MG CPEP Take 1 capsule by mouth daily. 03/04/22   [provider]  levothyroxine (SYNTHROID) 150 MCG tablet Take 1 tablet (150 mcg total) by mouth daily before breakfast. 03/13/22   Fargo, Amy E, NP  metoprolol tartrate (LOPRESSOR) 25 MG tablet Take 0.5 tablets (12.5 mg total) by mouth 2 (two) times daily. 02/16/21   Duke, Tami Lin, PA  Probiotic Product (PROBIOTIC GUMMIES PO) Take 2 tablets by mouth in the morning.    [provider]  sertraline (ZOLOFT) 100 MG tablet Take 50-100 mg by mouth See admin instructions. Starting on 02/10/2022, taper off sertraline as follows: Take 100 mg by mouth daily at bedtime for 5 days, then 50 mg daily at bedtime for 5 days, then STOP 02/10/22 02/19/22  [provider]  sodium chloride 1 g tablet Take 1 tablet (1 g total) by mouth 2 (two) times daily with a meal. Patient not taking: Reported on 03/06/2022 02/14/22 03/16/22  Mercy Riding, MD  traMADol (ULTRAM) 50 MG tablet Take 1 tablet (50 mg total) by mouth every 8 (eight) hours as needed for moderate pain. 02/14/22 02/14/23  Mercy Riding, MD  olmesartan (BENICAR) 20 MG tablet Take 20 mg by mouth daily.    04/25/10  [provider]      Allergies    Ace inhibitors and Latex    Review of Systems   Review of Systems  All other systems reviewed and are negative.   Physical Exam Updated Vital Signs Ht '5\' 3"'$  (1.6 m)   Wt 90.7 kg   BMI 35.43 kg/m  Physical Exam Vitals and nursing note reviewed.  Constitutional:      General: She is not in acute distress.    Appearance: Normal appearance. She is well-developed.  HENT:     Head: Normocephalic and atraumatic.  Eyes:     Conjunctiva/sclera: Conjunctivae normal.     Pupils: Pupils are equal, round, and reactive to light.   Cardiovascular:     Rate and Rhythm: Normal rate and regular rhythm.     Heart sounds: Normal heart sounds.  Pulmonary:     Effort: Pulmonary effort is normal. No respiratory distress.     Breath sounds: Normal breath sounds.  Abdominal:     General: There is no distension.     Palpations: Abdomen is soft.     Tenderness: There is no abdominal tenderness.  Musculoskeletal:        General: No deformity. Normal range of motion.     Cervical back: Normal range of motion and neck supple.  Skin:    General: Skin is warm and dry.  Neurological:     General: No focal deficit present.     Mental Status: She is alert and oriented to person, place, and time.     ED Results / Procedures / Treatments   Labs (all labs ordered are listed, but only abnormal results are displayed) Labs Reviewed  URINALYSIS, ROUTINE W REFLEX MICROSCOPIC - Abnormal; Notable for the following components:      Result Value   APPearance CLOUDY (*)    Hgb urine dipstick MODERATE (*)    Nitrite POSITIVE (*)    Leukocytes,Ua LARGE (*)    Bacteria, UA MANY (*)    All other components within normal limits  CBC WITH DIFFERENTIAL/PLATELET - Abnormal; Notable for the following components:   Hemoglobin 11.8 (*)    All other components within normal limits  COMPREHENSIVE METABOLIC PANEL - Abnormal; Notable for the following components:   Chloride 96 (*)    Glucose, Bld 131 (*)    Calcium 8.7 (*)    Albumin 3.4 (*)    All other components within normal limits  CULTURE, BLOOD (ROUTINE X 2)  CULTURE, BLOOD (ROUTINE X 2)  RESP PANEL BY RT-PCR (RSV, FLU A&B, COVID)  [id number]  LACTIC ACID, PLASMA  LACTIC ACID, PLASMA    EKG None  Radiology DG Chest Port 1 View  Result Date: 03/14/2022 CLINICAL DATA:  Weakness EXAM: PORTABLE CHEST 1 VIEW COMPARISON:  11/26/2021 CT FINDINGS: Cardiac shadow is enlarged but stable. The lungs are well aerated bilaterally. No focal infiltrate or sizable effusion is seen. No bony  abnormality is noted. IMPRESSION: No active disease. Electronically Signed   By: Inez Catalina M.D.   On: 03/14/2022 20:46    Procedures Procedures    Medications Ordered in ED Medications - No data to display  ED Course/ Medical Decision Making/ A&P                             Medical Decision Making Amount and/or Complexity of Data Reviewed Labs: ordered. Radiology: ordered.  Risk Prescription drug management. Decision regarding hospitalization.  Medical Screen Complete  This patient presented to the ED with complaint of weakness.  This complaint involves an extensive number of treatment options. The initial differential diagnosis includes, but is not limited to, metabolic abnormality, infection  This presentation is: Acute, Chronic, Self-Limited, Previously Undiagnosed, Uncertain Prognosis, Complicated, Systemic Symptoms, and Threat to Life/Bodily Function  Patient with medical history significant for paroxysmal A-fib, vertebral fracture, right breast cancer, CAD, CHF, hypertension, hyperlipidemia, chronic foley placement, and recently diagnosed stiff person syndrome presents with her daughter.  Daughter is concerned that the patient's mobility and functionality of significantly decreased over the last week.  The daughter is concerned that she can no longer care for the patient at home in a safe manner.  The daughter is requesting admission and or assistance with placement of the patient.  Patient and patient's daughter advised that ER workup to evaluate for acute pathology will be initiated.  If no acute abnormality identified then patient can be held in the ED for pending social work evaluation.  Workup is suggestive of acute urinary tract infection.  IV antibiotics initiated here in the ED.  Hospital service made aware case and will evaluate for admission.   Additional history obtained:  Additional history obtained from [facility] External records from outside  sources obtained and reviewed including prior ED visits and prior Inpatient records.    Lab Tests:  I ordered and personally interpreted labs.  The pertinent results include: CBC, CMP, lactic acid, cultures, COVID, flu, UA   Imaging Studies ordered:  I ordered imaging studies including chest x-ray I independently visualized and interpreted obtained imaging which showed NAD I agree with the radiologist interpretation.   Cardiac Monitoring:  The patient was maintained on a cardiac monitor.  I personally viewed and interpreted the cardiac monitor which showed an underlying rhythm of: NSR   Problem List / ED Course:  Weakness   Reevaluation:  After the interventions noted above, I reevaluated the patient and found that they have: stayed the same  Disposition:  After consideration of the diagnostic results and the patients response to treatment, I feel that the patent would benefit from admission.          Final Clinical Impression(s) / ED Diagnoses Final diagnoses:  Weakness  Urinary tract infection without hematuria, site unspecified    Rx / DC Orders ED Discharge Orders     None         Valarie Merino, MD 03/14/22 2248

## 2022-03-15 DIAGNOSIS — N390 Urinary tract infection, site not specified: Secondary | ICD-10-CM | POA: Diagnosis not present

## 2022-03-15 DIAGNOSIS — R5381 Other malaise: Secondary | ICD-10-CM | POA: Diagnosis present

## 2022-03-15 DIAGNOSIS — G2582 Stiff-man syndrome: Secondary | ICD-10-CM

## 2022-03-15 DIAGNOSIS — R760 Raised antibody titer: Secondary | ICD-10-CM

## 2022-03-15 DIAGNOSIS — I480 Paroxysmal atrial fibrillation: Secondary | ICD-10-CM

## 2022-03-15 DIAGNOSIS — T83511A Infection and inflammatory reaction due to indwelling urethral catheter, initial encounter: Secondary | ICD-10-CM | POA: Diagnosis not present

## 2022-03-15 LAB — CBC
HCT: 35.4 % — ABNORMAL LOW (ref 36.0–46.0)
Hemoglobin: 11.3 g/dL — ABNORMAL LOW (ref 12.0–15.0)
MCH: 30.5 pg (ref 26.0–34.0)
MCHC: 31.9 g/dL (ref 30.0–36.0)
MCV: 95.4 fL (ref 80.0–100.0)
Platelets: 281 10*3/uL (ref 150–400)
RBC: 3.71 MIL/uL — ABNORMAL LOW (ref 3.87–5.11)
RDW: 14.9 % (ref 11.5–15.5)
WBC: 5.5 10*3/uL (ref 4.0–10.5)
nRBC: 0 % (ref 0.0–0.2)

## 2022-03-15 LAB — RESP PANEL BY RT-PCR (RSV, FLU A&B, COVID)  RVPGX2
Influenza A by PCR: NEGATIVE
Influenza B by PCR: NEGATIVE
Resp Syncytial Virus by PCR: NEGATIVE
SARS Coronavirus 2 by RT PCR: NEGATIVE

## 2022-03-15 LAB — BASIC METABOLIC PANEL
Anion gap: 5 (ref 5–15)
BUN: 11 mg/dL (ref 8–23)
CO2: 29 mmol/L (ref 22–32)
Calcium: 8.1 mg/dL — ABNORMAL LOW (ref 8.9–10.3)
Chloride: 97 mmol/L — ABNORMAL LOW (ref 98–111)
Creatinine, Ser: 0.5 mg/dL (ref 0.44–1.00)
GFR, Estimated: >60 mL/min (ref >60)
Glucose, Bld: 117 mg/dL — ABNORMAL HIGH (ref 70–99)
Potassium: 3.9 mmol/L (ref 3.5–5.1)
Sodium: 131 mmol/L — ABNORMAL LOW (ref 135–145)

## 2022-03-15 LAB — LACTIC ACID, PLASMA: Lactic Acid, Venous: 1.3 mmol/L (ref 0.5–1.9)

## 2022-03-15 MED ORDER — ONDANSETRON HCL 4 MG/2ML IJ SOLN: 4.0000 mg | Freq: Four times a day (QID) | INTRAMUSCULAR | Status: DC | PRN

## 2022-03-15 MED ORDER — SODIUM CHLORIDE 0.9 % IV SOLN
1.0000 g | INTRAVENOUS | Status: DC
  Administered 2022-03-15: 1 g via INTRAVENOUS
  Filled 2022-03-15: qty 10

## 2022-03-15 MED ORDER — ACETAMINOPHEN 325 MG PO TABS
650.0000 mg | ORAL_TABLET | Freq: Four times a day (QID) | ORAL | Status: DC | PRN
  Administered 2022-03-15 – 2022-03-18 (×5): 650 mg via ORAL
  Filled 2022-03-15 (×4): qty 2

## 2022-03-15 MED ORDER — METOPROLOL TARTRATE 12.5 MG HALF TABLET
12.5000 mg | ORAL_TABLET | Freq: Two times a day (BID) | ORAL | Status: DC
  Administered 2022-03-15 – 2022-03-19 (×9): 12.5 mg via ORAL
  Filled 2022-03-15 (×9): qty 1

## 2022-03-15 MED ORDER — ACETAMINOPHEN 500 MG PO TABS
1000.0000 mg | ORAL_TABLET | Freq: Once | ORAL | Status: AC
  Administered 2022-03-15: 1000 mg via ORAL
  Filled 2022-03-15: qty 2

## 2022-03-15 MED ORDER — DIAZEPAM 5 MG PO TABS
5.0000 mg | ORAL_TABLET | Freq: Four times a day (QID) | ORAL | Status: DC | PRN
  Administered 2022-03-15 – 2022-03-19 (×13): 5 mg via ORAL
  Filled 2022-03-15 (×13): qty 1

## 2022-03-15 MED ORDER — LEVOTHYROXINE SODIUM 75 MCG PO TABS
150.0000 ug | ORAL_TABLET | Freq: Every day | ORAL | Status: DC
  Administered 2022-03-15 – 2022-03-19 (×5): 150 ug via ORAL
  Filled 2022-03-15 (×3): qty 2
  Filled 2022-03-15: qty 1
  Filled 2022-03-15: qty 2

## 2022-03-15 MED ORDER — ONDANSETRON HCL 4 MG PO TABS: 4.0000 mg | ORAL_TABLET | Freq: Four times a day (QID) | ORAL | Status: DC | PRN

## 2022-03-15 MED ORDER — APIXABAN 5 MG PO TABS
5.0000 mg | ORAL_TABLET | Freq: Two times a day (BID) | ORAL | Status: DC
  Administered 2022-03-15 – 2022-03-19 (×9): 5 mg via ORAL
  Filled 2022-03-15 (×9): qty 1

## 2022-03-15 MED ORDER — BISACODYL 5 MG PO TBEC: 5.0000 mg | DELAYED_RELEASE_TABLET | Freq: Every day | ORAL | Status: DC | PRN

## 2022-03-15 MED ORDER — ACETAMINOPHEN 650 MG RE SUPP: 650.0000 mg | Freq: Four times a day (QID) | RECTAL | Status: DC | PRN

## 2022-03-15 MED ORDER — POLYETHYLENE GLYCOL 3350 17 G PO PACK
17.0000 g | PACK | Freq: Two times a day (BID) | ORAL | Status: DC
  Administered 2022-03-15 – 2022-03-17 (×5): 17 g via ORAL
  Filled 2022-03-15 (×7): qty 1

## 2022-03-15 NOTE — Assessment & Plan Note (Addendum)
Managed by neurology as outpt. Cont valium 3-4 times daily PRN Seems like she's needing it 4 times daily per neuro office note from last week Consider baclofen for spasms but this could worsen LE weakness per neuro office note. Continue current modalities and hold off on increasing Valium or starting baclofen at this time. Outpatient follow-up with neurology.

## 2022-03-15 NOTE — Progress Notes (Signed)
OT Cancellation Note  Patient Details Name: MELENDA WARNING MRN: [id number] DOB: [birth year]   Cancelled Treatment:    Reason Eval/Treat Not Completed: Fatigue/lethargy limiting ability to participate Patient was in bed asleep after valium and tylenol from nurse. OT to continue to follow and check back as schedule will allow.  Rennie Plowman, MS Acute Rehabilitation Department Office# [phone]  03/15/2022, 12:57 PM

## 2022-03-15 NOTE — Assessment & Plan Note (Signed)
Urine cultures with > 100,000 colonies of E. coli with resistance to ampicillin, cefazolin, Unasyn and sensitive to third-generation cephalosporins, fluoroquinolones, gentamicin, imipenem, Macrobid, Bactrim.   -Continue IV Rocephin through today and transition to 1 dose of oral fosfomycin tomorrow to complete a 5-day course of treatment.  -Supportive care.

## 2022-03-15 NOTE — H&P (Signed)
History and Physical    Patient: Carmen Haney [id number] DOB: 10/16/1930 DOA: 03/14/2022 DOS: the patient was seen and examined on 03/15/2022 PCP: [name], NP  Patient coming from: Home  Chief Complaint:  Chief Complaint  Patient presents with   Anxiety   HPI: Carmen Haney is a 89 y.o. 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  Pt has needed foley placement for urinary retention recently.  Over the past 1 week, pt with worsened generalized weakness.  Not only unable to ambulate but now unable to assist with transfers.  Daughter unable to care for pt at home.  Work up in ED is suspicious for possible acute UTI in setting of foley catheter superimposed on stiff person syndrome.  Review of Systems: As mentioned in the history of present illness. All other systems reviewed and are negative. Past Medical History:  Diagnosis Date   ACE-inhibitor cough    Breast CA ([facility]) 2010   s/p lumpectomy & radiation   CAD (coronary artery disease) 07/1998   s/p cath in 2000. L main with 30 to 40%   Chronic diastolic heart failure (HCC)    Hearing loss    HTN (hypertension)    Hyperlipidemia    Obesities, morbid (HCC)    Osteoporosis    Paroxysmal atrial fibrillation (HCC)    Personal history of radiation therapy 2010   Right Breast Cancer   Thyroid disease    Vertebral fracture, pathological    Past Surgical History:  Procedure Laterality Date   BREAST BIOPSY Left    BREAST LUMPECTOMY Right March 2010   BREAST LUMPECTOMY WITH RADIOACTIVE SEED LOCALIZATION Left 09/20/2014   Procedure: LEFT BREAST LUMPECTOMY WITH RADIOACTIVE SEED LOCALIZATION;  Surgeon: Autumn Messing III, MD;  Location: [place];  Service: General;  Laterality: Left;   CARPAL TUNNEL RELEASE Right    CESAREAN SECTION     EYE SURGERY     Right Eye   Skin Cancer Removed from Right Forearm     TOTAL ABDOMINAL HYSTERECTOMY      Social History:  reports that she quit smoking about 59 years ago. Her smoking use included cigarettes. She has never used smokeless tobacco. She reports that she does not drink alcohol and does not use drugs.  Allergies  Allergen Reactions   Ace Inhibitors Cough   Latex Rash    Family History  Problem Relation Age of Onset   Coronary artery disease Mother    Hypertension Mother    Melanoma Mother    Cancer Mother 62       breast   Breast cancer Mother    Cancer Father 52       prostate   Cancer Sister 31       breast   Breast cancer Sister    Cancer Maternal Aunt 29       breast   Cancer Sister 73       breast   Breast cancer Sister    Breast cancer Daughter 2    Prior to Admission medications   Medication Sig Start Date End Date Taking? Authorizing Provider  acetaminophen (TYLENOL) 500 MG tablet Take 1,000 mg by mouth in the morning and at bedtime.    [provider]  apixaban (ELIQUIS) 5 MG TABS tablet Take 1 tablet (5 mg total) by mouth 2 (two) times daily. 02/16/21   Ledora Bottcher, PA  brexpiprazole (REXULTI) 0.25 MG TABS tablet Take 1 tablet (0.25 mg total) by mouth daily. Patient taking differently: Take by mouth daily. 1/2 tablet a day 02/05/22   Fargo, Amy E, NP  Calcium-Phosphorus-Vitamin D (CALCIUM/D3 ADULT GUMMIES PO) Take 500 mg by mouth in the morning.    [provider]  diazepam (VALIUM) 5 MG tablet Take 1 tablet 3-4 times a day Patient taking differently: 5 mg every 6 (six) hours. Take 1 tablet 3-4 times a day 02/14/22   Mercy Riding, MD  docusate sodium (COLACE) 100 MG capsule Take 1 capsule (100 mg total) by mouth 2 (two) times daily. Patient taking differently: Take 100 mg by mouth daily. 12/01/21   Lavina Hamman, MD  DULoxetine HCl 40 MG CPEP Take 1 capsule by mouth daily. 03/04/22   [provider]  levothyroxine (SYNTHROID) 150 MCG tablet Take 1 tablet (150 mcg total) by mouth daily before breakfast. 03/13/22   Fargo,  Amy E, NP  metoprolol tartrate (LOPRESSOR) 25 MG tablet Take 0.5 tablets (12.5 mg total) by mouth 2 (two) times daily. 02/16/21   Duke, Tami Lin, PA  Probiotic Product (PROBIOTIC GUMMIES PO) Take 2 tablets by mouth in the morning.    [provider]  sertraline (ZOLOFT) 100 MG tablet Take 50-100 mg by mouth See admin instructions. Starting on 02/10/2022, taper off sertraline as follows: Take 100 mg by mouth daily at bedtime for 5 days, then 50 mg daily at bedtime for 5 days, then STOP 02/10/22 02/19/22  [provider]  sodium chloride 1 g tablet Take 1 tablet (1 g total) by mouth 2 (two) times daily with a meal. Patient not taking: Reported on 03/06/2022 02/14/22 03/16/22  Mercy Riding, MD  traMADol (ULTRAM) 50 MG tablet Take 1 tablet (50 mg total) by mouth every 8 (eight) hours as needed for moderate pain. 02/14/22 02/14/23  Mercy Riding, MD  olmesartan (BENICAR) 20 MG tablet Take 20 mg by mouth daily.    04/25/10  [provider]    Physical Exam: Vitals:   03/14/22 2202 03/15/22 0000 03/15/22 0100 03/15/22 0304  BP: (!) 143/49 132/72 107/67 (!) 126/58  Pulse: 91 100 (!) 106 91  Resp:    18  Temp:    98 F (36.7 C)  TempSrc:    Oral  SpO2: 96% 93% 96% 93%  Weight:      Height:       Constitutional: NAD, calm, comfortable Respiratory: clear to auscultation bilaterally, no wheezing, no crackles. Normal respiratory effort. No accessory muscle use.  Cardiovascular: Regular rate and rhythm, no murmurs / rubs / gallops. No extremity edema. 2+ pedal pulses. No carotid bruits.  Abdomen: no tenderness, no masses palpated. No hepatosplenomegaly. Bowel sounds positive.  Neurologic: BLE motor weakness present. Psychiatric: Normal judgment and insight. Alert and oriented x 3. Normal mood.   Data Reviewed:       Latest Ref Rng & Units 03/14/2022   10:20 PM 02/13/2022    5:05 AM 02/11/2022    9:37 AM  CBC  WBC 4.0 - 10.5 K/uL 5.0  6.2  6.3   Hemoglobin 12.0 - 15.0 g/dL 11.8   12.1  12.2   Hematocrit 36.0 - 46.0 % 37.1  36.8  37.4   Platelets 150 - 400 K/uL 283  281  218       Latest Ref Rng & Units 03/14/2022   10:20 PM 02/14/2022    6:02 AM 02/13/2022    5:05  AM  CMP  Glucose 70 - 99 mg/dL 131  130  152   BUN 8 - 23 mg/dL '12  22  18   '$ Creatinine 0.44 - 1.00 mg/dL 0.74  0.70  0.70   Sodium 135 - 145 mmol/L 137  129  128   Potassium 3.5 - 5.1 mmol/L 3.9  4.2  4.4   Chloride 98 - 111 mmol/L 96  92  92   CO2 22 - 32 mmol/L 32  31  29   Calcium 8.9 - 10.3 mg/dL 8.7  8.6  8.6   Total Protein 6.5 - 8.1 g/dL 6.6     Total Bilirubin 0.3 - 1.2 mg/dL 0.5     Alkaline Phos 38 - 126 U/L 81     AST 15 - 41 U/L 18     ALT 0 - 44 U/L 14      Urinalysis    Component Value Date/Time   COLORURINE YELLOW 03/14/2022 2203   APPEARANCEUR CLOUDY (A) 03/14/2022 2203   LABSPEC 1.009 03/14/2022 2203   PHURINE 6.0 03/14/2022 2203   GLUCOSEU NEGATIVE 03/14/2022 2203   HGBUR MODERATE (A) 03/14/2022 2203   BILIRUBINUR NEGATIVE 03/14/2022 2203   KETONESUR NEGATIVE 03/14/2022 2203   PROTEINUR NEGATIVE 03/14/2022 2203   NITRITE POSITIVE (A) 03/14/2022 2203   LEUKOCYTESUR LARGE (A) 03/14/2022 2203      Assessment and Plan: * Debility Acute on chronic debility and inability to ambulate, now even unable to assist with transfers due to UTI superimposed on Stiff person syndrome. Treatment of UTI as below PT/OT SW consult for SNF placement.  UTI (urinary tract infection) UA suspicious for UTI in pt with indwelling foley catheter. 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 Cont valium 3-4 times daily PRN Seems like she's needing it 4 times daily per neuro office note from last week Consider baclofen for spasms but this could worsen LE weakness per neuro office note.  Atrial fibrillation ([place]) Cont metoprolol Cont eliquis      Advance  Care Planning:   Code Status: Full Code  Consults: None  Family Communication: No family in room  Severity of Illness: The appropriate patient status for this patient is OBSERVATION. Observation status is judged to be reasonable and necessary in order to provide the required intensity of service to ensure the patient's safety. The patient's presenting symptoms, physical exam findings, and initial radiographic and laboratory data in the context of their medical condition is felt to place them at decreased risk for further clinical deterioration. Furthermore, it is anticipated that the patient will be medically stable for discharge from the hospital within 2 midnights of admission.   Author: Etta Quill., DO 03/15/2022 4:09 AM  For on call review [email or web address].

## 2022-03-15 NOTE — ED Notes (Signed)
ED TO INPATIENT HANDOFF REPORT  ED Nurse Name and Phone #: Izola Price [phone]  S Name/Age/Gender Carmen Haney 89 y.o. female Room/Bed: WA03/WA03  Code Status   Code Status: Full Code  Home/SNF/Other N/A Patient oriented to: self, place, time, and situation Is this baseline? Yes   Triage Complete: Triage complete  Chief Complaint UTI (urinary tract infection) [N39.0]  Triage Note Pt arrived via EMS From home, for anxiety and pain manage,  Pt on Valium not working per EMS. EMS vitals 142/78 BP 98 pulse 97% 18rr 24rr 4/10 pain      Allergies Allergies  Allergen Reactions   Ace Inhibitors Cough   Latex Rash    Level of Care/Admitting Diagnosis ED Disposition     ED Disposition  Admit   Condition  --   Comment  Hospital Area: [place] [[id number]]  Level of Care: Med-Surg [16]  May place patient in observation at [facility] or [facility] [address] if equivalent level of care is available:: No  Covid Evaluation: Asymptomatic - no recent exposure (last 10 days) testing not required  Diagnosis: UTI (urinary tract [id number]  Admitting Physician: Etta Quill [id number]  Attending Physician: Etta Quill [phone]          B Medical/Surgery History Past Medical History:  Diagnosis Date   ACE-inhibitor cough    Breast CA ([place]) 2010   s/p lumpectomy & radiation   CAD (coronary artery disease) 07/1998   s/p cath in 2000. L main with 30 to 40%   Chronic diastolic heart failure (HCC)    Hearing loss    HTN (hypertension)    Hyperlipidemia    Obesities, morbid (HCC)    Osteoporosis    Paroxysmal atrial fibrillation (HCC)    Personal history of radiation therapy 2010   Right Breast Cancer   Thyroid disease    Vertebral fracture, pathological    Past Surgical History:  Procedure Laterality Date   BREAST BIOPSY Left    BREAST LUMPECTOMY Right March 2010   BREAST LUMPECTOMY WITH RADIOACTIVE SEED LOCALIZATION Left 09/20/2014    Procedure: LEFT BREAST LUMPECTOMY WITH RADIOACTIVE SEED LOCALIZATION;  Surgeon: Autumn Messing III, MD;  Location: [place];  Service: General;  Laterality: Left;   CARPAL TUNNEL RELEASE Right    [place]     Right Eye   Skin Cancer Removed from Right Forearm     TOTAL ABDOMINAL HYSTERECTOMY       A IV Location/Drains/Wounds Patient Lines/Drains/Airways Status     Active Line/Drains/Airways     Name Placement date Placement time Site Days   Peripheral IV 02/10/22 20 G Left Antecubital 02/10/22  1651  Antecubital  33   Peripheral IV 03/14/22 20 G 1" Anterior;Left;Proximal Forearm 03/14/22  2000  Forearm  1   External Urinary Catheter 02/13/22  2115  --  30            Intake/Output Last 24 hours No intake or output data in the 24 hours ending 03/15/22 [id number]  Labs/Imaging Results for orders placed or performed during the hospital encounter of 03/14/22 (from the past 48 hour(s))  Urinalysis, Routine w reflex microscopic -Urine, Clean Catch     Status: Abnormal   Collection Time: 03/14/22 10:03 PM  Result Value Ref Range   Color, Urine YELLOW YELLOW   APPearance CLOUDY (A) CLEAR   Specific Gravity, Urine 1.009 1.005 - 1.030   pH 6.0 5.0 -  8.0   Glucose, UA NEGATIVE NEGATIVE mg/dL   Hgb urine dipstick MODERATE (A) NEGATIVE   Bilirubin Urine NEGATIVE NEGATIVE   Ketones, ur NEGATIVE NEGATIVE mg/dL   Protein, ur NEGATIVE NEGATIVE mg/dL   Nitrite POSITIVE (A) NEGATIVE   Leukocytes,Ua LARGE (A) NEGATIVE   RBC / HPF 11-20 0 - 5 RBC/hpf   WBC, UA >50 0 - 5 WBC/hpf   Bacteria, UA MANY (A) NONE SEEN   Squamous Epithelial / HPF 0-5 0 - 5 /HPF   WBC Clumps PRESENT     Comment: Performed at [facility], [place] [address], [place], [place] [address]  CBC with Differential     Status: Abnormal   Collection Time: 03/14/22 10:20 PM  Result Value Ref Range   WBC 5.0 4.0 - 10.5 K/uL   RBC 3.91 3.87 - 5.11 MIL/uL   Hemoglobin 11.8 (L)  12.0 - 15.0 g/dL   HCT 37.1 36.0 - 46.0 %   MCV 94.9 80.0 - 100.0 fL   MCH 30.2 26.0 - 34.0 pg   MCHC 31.8 30.0 - 36.0 g/dL   RDW 15.0 11.5 - 15.5 %   Platelets 283 150 - 400 K/uL   nRBC 0.0 0.0 - 0.2 %   Neutrophils Relative % 63 %   Neutro Abs 3.1 1.7 - 7.7 K/uL   Lymphocytes Relative 21 %   Lymphs Abs 1.1 0.7 - 4.0 K/uL   Monocytes Relative 12 %   Monocytes Absolute 0.6 0.1 - 1.0 K/uL   Eosinophils Relative 3 %   Eosinophils Absolute 0.2 0.0 - 0.5 K/uL   Basophils Relative 1 %   Basophils Absolute 0.0 0.0 - 0.1 K/uL   Immature Granulocytes 0 %   Abs Immature Granulocytes 0.02 0.00 - 0.07 K/uL    Comment: Performed at [facility], [place] [address], [place], [place] [address]  Comprehensive metabolic panel     Status: Abnormal   Collection Time: 03/14/22 10:20 PM  Result Value Ref Range   Sodium 137 135 - 145 mmol/L   Potassium 3.9 3.5 - 5.1 mmol/L   Chloride 96 (L) 98 - 111 mmol/L   CO2 32 22 - 32 mmol/L   Glucose, Bld 131 (H) 70 - 99 mg/dL    Comment: Glucose reference range applies only to samples taken after fasting for at least 8 hours.   BUN 12 8 - 23 mg/dL   Creatinine, Ser 0.74 0.44 - 1.00 mg/dL   Calcium 8.7 (L) 8.9 - 10.3 mg/dL   Total Protein 6.6 6.5 - 8.1 g/dL   Albumin 3.4 (L) 3.5 - 5.0 g/dL   AST 18 15 - 41 U/L   ALT 14 0 - 44 U/L   Alkaline Phosphatase 81 38 - 126 U/L   Total Bilirubin 0.5 0.3 - 1.2 mg/dL   GFR, Estimated >60 >60 mL/min    Comment: (NOTE) Calculated using the CKD-EPI Creatinine Equation (2021)    Anion gap 9 5 - 15    Comment: Performed at [facility], [place] [address], [place], [place] [address]  Lactic acid, plasma     Status: None   Collection Time: 03/14/22 10:54 PM  Result Value Ref Range   Lactic Acid, Venous 1.0 0.5 - 1.9 mmol/L    Comment: Performed at [facility], [place] [address], [place], [place] [address]  Resp panel by RT-PCR (RSV, Flu A&B, Covid) Anterior Nasal Swab      Status: None   Collection  Time: 03/14/22 10:55 PM   Specimen: Anterior Nasal Swab  Result Value Ref Range   SARS Coronavirus 2 by RT PCR NEGATIVE NEGATIVE    Comment: (NOTE) SARS-CoV-2 target nucleic acids are NOT DETECTED.  The SARS-CoV-2 RNA is generally detectable in upper respiratory specimens during the acute phase of infection. The lowest concentration of SARS-CoV-2 viral copies this assay can detect is 138 copies/mL. A negative result does not preclude SARS-Cov-2 infection and should not be used as the sole basis for treatment or other patient management decisions. A negative result may occur with  improper specimen collection/handling, submission of specimen other than nasopharyngeal swab, presence of viral mutation(s) within the areas targeted by this assay, and inadequate number of viral copies(<138 copies/mL). A negative result must be combined with clinical observations, patient history, and epidemiological information. The expected result is Negative.  Fact Sheet for Patients:  EntrepreneurPulse.com.au  Fact Sheet for Healthcare Providers:  IncredibleEmployment.be  This test is no t yet approved or cleared by the [place] FDA and  has been authorized for detection and/or diagnosis of SARS-CoV-2 by FDA under an Emergency Use Authorization (EUA). This EUA will remain  in effect (meaning this test can be used) for the duration of the COVID-19 declaration under Section 564(b)(1) of the Act, 21 U.S.C.section 360bbb-3(b)(1), unless the authorization is terminated  or revoked sooner.       Influenza A by PCR NEGATIVE NEGATIVE   Influenza B by PCR NEGATIVE NEGATIVE    Comment: (NOTE) The Xpert Xpress SARS-CoV-2/FLU/RSV plus assay is intended as an aid in the diagnosis of influenza from Nasopharyngeal swab specimens and should not be used as a sole basis for treatment. Nasal washings and aspirates are unacceptable for Xpert  Xpress SARS-CoV-2/FLU/RSV testing.  Fact Sheet for Patients: EntrepreneurPulse.com.au  Fact Sheet for Healthcare Providers: IncredibleEmployment.be  This test is not yet approved or cleared by the [place] FDA and has been authorized for detection and/or diagnosis of SARS-CoV-2 by FDA under an Emergency Use Authorization (EUA). This EUA will remain in effect (meaning this test can be used) for the duration of the COVID-19 declaration under Section 564(b)(1) of the Act, 21 U.S.C. section 360bbb-3(b)(1), unless the authorization is terminated or revoked.     Resp Syncytial Virus by PCR NEGATIVE NEGATIVE    Comment: (NOTE) Fact Sheet for Patients: EntrepreneurPulse.com.au  Fact Sheet for Healthcare Providers: IncredibleEmployment.be  This test is not yet approved or cleared by the [place] FDA and has been authorized for detection and/or diagnosis of SARS-CoV-2 by FDA under an Emergency Use Authorization (EUA). This EUA will remain in effect (meaning this test can be used) for the duration of the COVID-19 declaration under Section 564(b)(1) of the Act, 21 U.S.C. section 360bbb-3(b)(1), unless the authorization is terminated or revoked.  Performed at [facility], [place] [address] [place], [place] [address]   CBC     Status: Abnormal   Collection Time: 03/15/22  6:10 AM  Result Value Ref Range   WBC 5.5 4.0 - 10.5 K/uL   RBC 3.71 (L) 3.87 - 5.11 MIL/uL   Hemoglobin 11.3 (L) 12.0 - 15.0 g/dL   HCT 35.4 (L) 36.0 - 46.0 %   MCV 95.4 80.0 - 100.0 fL   MCH 30.5 26.0 - 34.0 pg   MCHC 31.9 30.0 - 36.0 g/dL   RDW 14.9 11.5 - 15.5 %   Platelets 281 150 - 400 K/uL   nRBC 0.0 0.0 - 0.2 %  Comment: Performed at [facility], [place] [address] [place], [place] [id number]  Basic metabolic panel     Status: Abnormal   Collection Time: 03/15/22  6:10 AM  Result Value  Ref Range   Sodium 131 (L) 135 - 145 mmol/L   Potassium 3.9 3.5 - 5.1 mmol/L   Chloride 97 (L) 98 - 111 mmol/L   CO2 29 22 - 32 mmol/L   Glucose, Bld 117 (H) 70 - 99 mg/dL    Comment: Glucose reference range applies only to samples taken after fasting for at least 8 hours.   BUN 11 8 - 23 mg/dL   Creatinine, Ser 0.50 0.44 - 1.00 mg/dL   Calcium 8.1 (L) 8.9 - 10.3 mg/dL   GFR, Estimated >60 >60 mL/min    Comment: (NOTE) Calculated using the CKD-EPI Creatinine Equation (2021)    Anion gap 5 5 - 15    Comment: Performed at [facility], [place] [address] [place], [place] [address]   DG Chest Port 1 View  Result Date: 03/14/2022 CLINICAL DATA:  Weakness EXAM: PORTABLE CHEST 1 VIEW COMPARISON:  11/26/2021 CT FINDINGS: Cardiac shadow is enlarged but stable. The lungs are well aerated bilaterally. No focal infiltrate or sizable effusion is seen. No bony abnormality is noted. IMPRESSION: No active disease. Electronically Signed   By: Inez Catalina M.D.   On: 03/14/2022 20:46    Pending Labs Unresulted Labs (From admission, onward)     Start     Ordered   03/15/22 0253  Urine Culture (for pregnant, neutropenic or urologic patients or patients with an indwelling urinary catheter)  (Urine Labs)  Once,   URGENT       Question:  Indication  Answer:  Altered mental status (if no other cause identified)   03/15/22 0252   03/14/22 2031  Lactic acid, plasma  Now then every 2 hours,   R (with STAT occurrences)      03/14/22 2030   03/14/22 2031  Culture, blood (routine x 2)  BLOOD CULTURE X 2,   R (with STAT occurrences)      03/14/22 2030            Vitals/Pain Today's Vitals   03/15/22 0346 03/15/22 0400 03/15/22 0600 03/15/22 0700  BP:  125/65 107/86 (!) 125/55  Pulse:  87 88 79  Resp:  '18 18 18  '$ Temp:   98.1 F (36.7 C)   TempSrc:   Oral   SpO2:  94% 95% 94%  Weight:      Height:      PainSc: Asleep       Isolation Precautions Airborne and Contact  precautions  Medications Medications  cefTRIAXone (ROCEPHIN) 1 g in sodium chloride 0.9 % 100 mL IVPB (has no administration in time range)  diazepam (VALIUM) tablet 5 mg (has no administration in time range)  apixaban (ELIQUIS) tablet 5 mg (has no administration in time range)  metoprolol tartrate (LOPRESSOR) tablet 12.5 mg (12.5 mg Oral Not Given 03/15/22 0315)  levothyroxine (SYNTHROID) tablet 150 mcg (150 mcg Oral Given 03/15/22 [id number])  acetaminophen (TYLENOL) tablet 650 mg (has no administration in time range)    Or  acetaminophen (TYLENOL) suppository 650 mg (has no administration in time range)  ondansetron (ZOFRAN) tablet 4 mg (has no administration in time range)    Or  ondansetron (ZOFRAN) injection 4 mg (has no administration in time range)  polyethylene glycol (MIRALAX / GLYCOLAX) packet 17 g (has no administration in  time range)  bisacodyl (DULCOLAX) EC tablet 5 mg (has no administration in time range)  diazepam (VALIUM) tablet 5 mg (5 mg Oral Given 03/14/22 2234)  cefTRIAXone (ROCEPHIN) 1 g in sodium chloride 0.9 % 100 mL IVPB (0 g Intravenous Stopped 03/14/22 2337)  acetaminophen (TYLENOL) tablet 1,000 mg (1,000 mg Oral Given 03/15/22 0152)    Mobility manual wheelchair     Focused Assessments N/A   R Recommendations: See Admitting Provider Note  Report given to:   Additional Notes:  N/A

## 2022-03-15 NOTE — Progress Notes (Signed)
PT Cancellation Note  Patient Details Name: Carmen Haney MRN: [id number] DOB: 1930/07/27   Cancelled Treatment:     PT order received but eval deferred - Fatigue/lethargy limiting ability to participate Patient was in bed asleep after valium and tylenol from nurse.  Will follow.   Sofija Antwi 03/15/2022, 3:09 PM

## 2022-03-15 NOTE — ED Notes (Signed)
Airborne precautions discontinued Respiratory panel negative

## 2022-03-15 NOTE — Assessment & Plan Note (Signed)
Paroxysmal - Continue metoprolol and Eliquis

## 2022-03-15 NOTE — Assessment & Plan Note (Signed)
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

## 2022-03-15 NOTE — Progress Notes (Signed)
Pt incont of urine in spite of having indwelling foley cath. Upon checking patency of foley noted cath fell out of pt's vagina w balloon completely deflated. Inserted # 16 Fr foley cath(2 attempts), pt tol well.

## 2022-03-15 NOTE — Progress Notes (Signed)
Brief progress note: -Patient was admitted earlier today. -As per H&P done on admission: "Carmen Haney is a 89 y.o. 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   Pt has needed foley placement for urinary retention recently.   Over the past 1 week, pt with worsened generalized weakness.  Not only unable to ambulate but now unable to assist with transfers.  Daughter unable to care for pt at home.   Work up in ED is suspicious for possible acute UTI in setting of foley catheter superimposed on stiff person syndrome.  Debility Acute on chronic debility and inability to ambulate, now even unable to assist with transfers due to UTI superimposed on Stiff person syndrome. Treatment of UTI as below PT/OT SW consult for SNF placement.   UTI (urinary tract infection) UA suspicious for UTI in pt with indwelling foley catheter. 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 Cont valium 3-4 times daily PRN Seems like she's needing it 4 times daily per neuro office note from last week Consider baclofen for spasms but this could worsen LE weakness per neuro office note.   Atrial fibrillation (HCC) Cont metoprolol Cont eliquis"   -Patient's daughter updated. -Treat for UTI and possible cellulitis following. -Have a very low threshold to consult patient's nephrologist (Dr. Berdine Addison with [place] [facility], according to the patient's daughter) -Patient is likely failing to thrive and may likely need SNF placement. -Continue physical therapy involvement. -Have a low threshold to also consult palliative care team.

## 2022-03-16 ENCOUNTER — Telehealth: Payer: Self-pay

## 2022-03-16 ENCOUNTER — Telehealth: Payer: Medicare PPO | Admitting: Orthopedic Surgery

## 2022-03-16 DIAGNOSIS — T83511A Infection and inflammatory reaction due to indwelling urethral catheter, initial encounter: Secondary | ICD-10-CM | POA: Diagnosis present

## 2022-03-16 DIAGNOSIS — R5381 Other malaise: Secondary | ICD-10-CM | POA: Diagnosis not present

## 2022-03-16 DIAGNOSIS — Z87891 Personal history of nicotine dependence: Secondary | ICD-10-CM | POA: Diagnosis not present

## 2022-03-16 DIAGNOSIS — Z90710 Acquired absence of both cervix and uterus: Secondary | ICD-10-CM | POA: Diagnosis not present

## 2022-03-16 DIAGNOSIS — F419 Anxiety disorder, unspecified: Secondary | ICD-10-CM | POA: Diagnosis present

## 2022-03-16 DIAGNOSIS — E669 Obesity, unspecified: Secondary | ICD-10-CM | POA: Diagnosis present

## 2022-03-16 DIAGNOSIS — H9190 Unspecified hearing loss, unspecified ear: Secondary | ICD-10-CM | POA: Diagnosis present

## 2022-03-16 DIAGNOSIS — E785 Hyperlipidemia, unspecified: Secondary | ICD-10-CM | POA: Diagnosis present

## 2022-03-16 DIAGNOSIS — R531 Weakness: Secondary | ICD-10-CM | POA: Diagnosis not present

## 2022-03-16 DIAGNOSIS — Z923 Personal history of irradiation: Secondary | ICD-10-CM | POA: Diagnosis not present

## 2022-03-16 DIAGNOSIS — Z6835 Body mass index (BMI) 35.0-35.9, adult: Secondary | ICD-10-CM | POA: Diagnosis not present

## 2022-03-16 DIAGNOSIS — I2510 Atherosclerotic heart disease of native coronary artery without angina pectoris: Secondary | ICD-10-CM | POA: Diagnosis present

## 2022-03-16 DIAGNOSIS — Z85828 Personal history of other malignant neoplasm of skin: Secondary | ICD-10-CM | POA: Diagnosis not present

## 2022-03-16 DIAGNOSIS — R760 Raised antibody titer: Secondary | ICD-10-CM | POA: Diagnosis not present

## 2022-03-16 DIAGNOSIS — G2582 Stiff-man syndrome: Secondary | ICD-10-CM | POA: Diagnosis present

## 2022-03-16 DIAGNOSIS — Z1611 Resistance to penicillins: Secondary | ICD-10-CM | POA: Diagnosis present

## 2022-03-16 DIAGNOSIS — I480 Paroxysmal atrial fibrillation: Secondary | ICD-10-CM | POA: Diagnosis present

## 2022-03-16 DIAGNOSIS — I5032 Chronic diastolic (congestive) heart failure: Secondary | ICD-10-CM | POA: Diagnosis present

## 2022-03-16 DIAGNOSIS — B9620 Unspecified Escherichia coli [E. coli] as the cause of diseases classified elsewhere: Secondary | ICD-10-CM | POA: Diagnosis present

## 2022-03-16 DIAGNOSIS — Z91040 Latex allergy status: Secondary | ICD-10-CM | POA: Diagnosis not present

## 2022-03-16 DIAGNOSIS — R627 Adult failure to thrive: Secondary | ICD-10-CM | POA: Diagnosis present

## 2022-03-16 DIAGNOSIS — I110 Hypertensive heart disease with heart failure: Secondary | ICD-10-CM | POA: Diagnosis present

## 2022-03-16 DIAGNOSIS — Z888 Allergy status to other drugs, medicaments and biological substances status: Secondary | ICD-10-CM | POA: Diagnosis not present

## 2022-03-16 DIAGNOSIS — E860 Dehydration: Secondary | ICD-10-CM | POA: Diagnosis present

## 2022-03-16 DIAGNOSIS — Z853 Personal history of malignant neoplasm of breast: Secondary | ICD-10-CM | POA: Diagnosis not present

## 2022-03-16 DIAGNOSIS — Z1152 Encounter for screening for COVID-19: Secondary | ICD-10-CM | POA: Diagnosis not present

## 2022-03-16 DIAGNOSIS — N390 Urinary tract infection, site not specified: Secondary | ICD-10-CM | POA: Diagnosis present

## 2022-03-16 LAB — RENAL FUNCTION PANEL
Albumin: 3.2 g/dL — ABNORMAL LOW (ref 3.5–5.0)
Anion gap: 9 (ref 5–15)
BUN: 11 mg/dL (ref 8–23)
CO2: 30 mmol/L (ref 22–32)
Calcium: 8.7 mg/dL — ABNORMAL LOW (ref 8.9–10.3)
Chloride: 98 mmol/L (ref 98–111)
Creatinine, Ser: 0.72 mg/dL (ref 0.44–1.00)
GFR, Estimated: >60 mL/min (ref >60)
Glucose, Bld: 112 mg/dL — ABNORMAL HIGH (ref 70–99)
Phosphorus: 4.5 mg/dL (ref 2.5–4.6)
Potassium: 4 mmol/L (ref 3.5–5.1)
Sodium: 137 mmol/L (ref 135–145)

## 2022-03-16 LAB — CBC WITH DIFFERENTIAL/PLATELET
Abs Immature Granulocytes: 0.02 10*3/uL (ref 0.00–0.07)
Basophils Absolute: 0 10*3/uL (ref 0.0–0.1)
Basophils Relative: 1 %
Eosinophils Absolute: 0.1 10*3/uL (ref 0.0–0.5)
Eosinophils Relative: 3 %
HCT: 36.9 % (ref 36.0–46.0)
Hemoglobin: 11.9 g/dL — ABNORMAL LOW (ref 12.0–15.0)
Immature Granulocytes: 1 %
Lymphocytes Relative: 28 %
Lymphs Abs: 1.2 10*3/uL (ref 0.7–4.0)
MCH: 30.9 pg (ref 26.0–34.0)
MCHC: 32.2 g/dL (ref 30.0–36.0)
MCV: 95.8 fL (ref 80.0–100.0)
Monocytes Absolute: 0.4 10*3/uL (ref 0.1–1.0)
Monocytes Relative: 10 %
Neutro Abs: 2.4 10*3/uL (ref 1.7–7.7)
Neutrophils Relative %: 57 %
Platelets: 280 10*3/uL (ref 150–400)
RBC: 3.85 MIL/uL — ABNORMAL LOW (ref 3.87–5.11)
RDW: 15.1 % (ref 11.5–15.5)
WBC: 4.2 10*3/uL (ref 4.0–10.5)
nRBC: 0 % (ref 0.0–0.2)

## 2022-03-16 LAB — MAGNESIUM: Magnesium: 2.1 mg/dL (ref 1.7–2.4)

## 2022-03-16 MED ORDER — POLYETHYLENE GLYCOL 3350 17 G PO PACK
17.0000 g | PACK | Freq: Every day | ORAL | Status: DC
  Administered 2022-03-17 – 2022-03-19 (×3): 17 g via ORAL
  Filled 2022-03-16: qty 1

## 2022-03-16 MED ORDER — ALUM & MAG HYDROXIDE-SIMETH 200-200-20 MG/5ML PO SUSP
30.0000 mL | ORAL | Status: DC | PRN
  Administered 2022-03-17: 30 mL via ORAL
  Filled 2022-03-16 (×2): qty 30

## 2022-03-16 MED ORDER — BREXPIPRAZOLE 0.25 MG PO TABS
0.1250 mg | ORAL_TABLET | Freq: Every day | ORAL | Status: DC
  Filled 2022-03-16 (×2): qty 0.5

## 2022-03-16 MED ORDER — DOCUSATE SODIUM 100 MG PO CAPS
100.0000 mg | ORAL_CAPSULE | Freq: Every day | ORAL | Status: DC
  Administered 2022-03-16 – 2022-03-19 (×4): 100 mg via ORAL
  Filled 2022-03-16 (×4): qty 1

## 2022-03-16 MED ORDER — SODIUM CHLORIDE 0.9 % IV SOLN
2.0000 g | INTRAVENOUS | Status: AC
  Administered 2022-03-16 – 2022-03-17 (×2): 2 g via INTRAVENOUS
  Filled 2022-03-16 (×2): qty 20

## 2022-03-16 MED ORDER — PROBIOTIC GUMMIES 30 MG PO CHEW: CHEWABLE_TABLET | Freq: Every morning | ORAL | Status: DC

## 2022-03-16 MED ORDER — DULOXETINE HCL 20 MG PO CPEP
40.0000 mg | ORAL_CAPSULE | Freq: Every day | ORAL | Status: DC
  Administered 2022-03-16 – 2022-03-18 (×3): 40 mg via ORAL
  Filled 2022-03-16 (×3): qty 2

## 2022-03-16 MED ORDER — ACETAMINOPHEN 500 MG PO TABS
1000.0000 mg | ORAL_TABLET | Freq: Two times a day (BID) | ORAL | Status: DC
  Administered 2022-03-16 – 2022-03-19 (×7): 1000 mg via ORAL
  Filled 2022-03-16 (×7): qty 2

## 2022-03-16 MED ORDER — CHLORHEXIDINE GLUCONATE CLOTH 2 % EX PADS
6.0000 | MEDICATED_PAD | Freq: Every day | CUTANEOUS | Status: DC
  Administered 2022-03-16 – 2022-03-19 (×4): 6 via TOPICAL

## 2022-03-16 NOTE — Progress Notes (Signed)
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 -Urine cultures pending with preliminary results with > 100,000 colonies of E. coli with sensitivities pending. -Continue IV antibiotics. -Patient seen by PT/OT who are recommending SNF placement. -TOC consulted for SNF placement.  UTI (urinary tract infection) UA suspicious for UTI in pt with indwelling foley catheter. -Preliminary urine cultures with > 100,000 colonies of E. coli with sensitivities pending. -Continue empiric IV Rocephin pending finalization of urine results. -Supportive care.  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 Cont valium 3-4 times daily PRN Seems like she's needing it 4 times daily per neuro office note from last week Consider baclofen for spasms but this could worsen LE weakness per neuro office note. Continue current modalities and hold off on increasing Valium or starting baclofen at this time. Outpatient follow-up with neurology.  Atrial fibrillation (HCC) -Continue metoprolol for rate control.   -Eliquis for anticoagulation.          DVT prophylaxis: Eliquis Code Status: Full Family Communication: Updated patient and daughters at bedside. Disposition: Likely SNF  Status is: Inpatient The patient will require care spanning > 2 midnights and should be moved to inpatient because:  Severity of illness   Consultants:  None  Procedures:  Chest x-ray 03/14/2022   Antimicrobials:  IV Rocephin 03/14/2022>>>>   Subjective: Laying in bed.  Complains of generalized weakness.  Still with significant weakness in lower extremities.  Objective: Vitals:   03/16/22 0542 03/16/22 0933 03/16/22 1223 03/16/22 1411  BP: 135/67 137/62 122/74 104/65  Pulse: 73 83 (!) 59 66  Resp: '20 16 16 16  '$ Temp: 98.6 F (37 C) 98 F (36.7 C) (!) 97.3 F (36.3 C) 97.8 F (36.6 C)  TempSrc: Oral Oral Oral Oral  SpO2: 93% 98% 97% 98%  Weight:      Height:        Intake/Output Summary (Last 24 hours) at 03/16/2022 2016 Last data filed at 03/16/2022 1800 Gross per 24 hour  Intake 720 ml  Output 1600 ml  Net -880 ml   Filed Weights   03/14/22 1945  Weight: 90.7 kg    Examination:  General exam: Appears calm and comfortable  Respiratory system: Clear to auscultation anterior lung fields.  No wheezes, no crackles, no rhonchi.  Fair air movement.  Speaking in full sentences.Marland Kitchen Respiratory effort normal. Cardiovascular system: S1 & S2 heard, RRR. No JVD, murmurs, rubs, gallops or clicks. No pedal edema. Gastrointestinal system: Abdomen is nondistended, soft and nontender. No organomegaly or masses felt. Normal bowel sounds heard. Central nervous system: Alert and oriented. No focal neurological deficits. Extremities: Symmetric 5 x 5 power. Skin: Bilateral lower extremities right greater than left with some erythema, warmth and some tenderness to palpation.  Psychiatry: Judgement and insight appear normal. Mood & affect appropriate.     Data Reviewed:   CBC: Recent Labs  Lab 03/14/22 2220 03/15/22 0610 03/16/22 1015  WBC 5.0 5.5 4.2  NEUTROABS 3.1  --  2.4  HGB 11.8* 11.3* 11.9*  HCT 37.1 35.4* 36.9  MCV 94.9 95.4 95.8  PLT 283 281 [id number]    Basic Metabolic Panel: Recent Labs  Lab 03/14/22 2220 03/15/22 0610 03/16/22 1015  NA 137 131* 137  K 3.9 3.9 4.0  CL 96* 97* 98   CO2 32 29 30  GLUCOSE 131* 117* 112*  BUN '12 11 11  '$ CREATININE 0.74 0.50 0.72  CALCIUM 8.7* 8.1* 8.7*  MG  --   --  2.1  PHOS  --   --  4.5    GFR: Estimated Creatinine Clearance: 51 mL/min (by C-G formula based on SCr of 0.72 mg/dL).  Liver Function Tests: Recent Labs  Lab 03/14/22 2220 03/16/22 1015  AST 18  --   ALT 14  --   ALKPHOS 81  --   BILITOT 0.5  --   PROT 6.6  --   ALBUMIN 3.4* 3.2*    CBG: No results for input(s): "GLUCAP" in the last 168 hours.   Recent Results (from the past 240 hour(s))  Culture, blood (routine x 2)     Status: None (Preliminary result)   Collection Time: 03/14/22 12:25 AM   Specimen: BLOOD  Result Value Ref Range Status   Specimen Description   Final    BLOOD SITE NOT SPECIFIED Performed at [facility], [address], [place], [place] [address]    Special Requests   Final    BOTTLES DRAWN AEROBIC AND ANAEROBIC Blood Culture adequate volume Performed at [facility] [address] [place], [place] [address]    Culture   Final    NO GROWTH < 24 HOURS Performed at [facility] [address], [place], [place] [address]    Report Status PENDING  Incomplete  Urine Culture (for pregnant, neutropenic or urologic patients or patients with an indwelling urinary catheter)     Status: Abnormal (Preliminary result)   Collection Time: 03/14/22 10:03 PM   Specimen: Urine, Catheterized  Result Value Ref Range Status   Specimen Description   Final    URINE, CATHETERIZED Performed at [facility], [place] [address], [place], [place] [address]    Special Requests   Final    NONE Performed at [facility], [place] [address], [place], [place] [address]    Culture (A)  Final    >=100,000 COLONIES/mL ESCHERICHIA COLI SUSCEPTIBILITIES TO FOLLOW Performed at [facility], [place] [address] [place], [place] [address]    Report Status PENDING  Incomplete  Resp panel by  RT-PCR (RSV, Flu A&B, Covid) Anterior Nasal Swab     Status: None   Collection Time: 03/14/22 10:55 PM   Specimen: Anterior Nasal Swab  Result Value Ref Range Status   SARS Coronavirus 2 by RT PCR NEGATIVE NEGATIVE Final    Comment: (NOTE) SARS-CoV-2 target nucleic acids are NOT DETECTED.  The SARS-CoV-2 RNA is generally detectable in upper respiratory specimens during the acute phase of infection. The lowest concentration of SARS-CoV-2 viral copies this assay can detect is 138 copies/mL. A negative result does not preclude SARS-Cov-2 infection and should not be used as the sole basis for treatment or other patient management decisions. A negative result may occur with  improper specimen collection/handling, submission of specimen other than nasopharyngeal swab, presence of viral mutation(s) within the areas targeted by  this assay, and inadequate number of viral copies(<138 copies/mL). A negative result must be combined with clinical observations, patient history, and epidemiological information. The expected result is Negative.  Fact Sheet for Patients:  EntrepreneurPulse.com.au  Fact Sheet for Healthcare Providers:  IncredibleEmployment.be  This test is no t yet approved or cleared by the [place] FDA and  has been authorized for detection and/or diagnosis of SARS-CoV-2 by FDA under an Emergency Use Authorization (EUA). This EUA will remain  in effect (meaning this test can be used) for the duration of the COVID-19 declaration under Section 564(b)(1) of the Act, 21 U.S.C.section 360bbb-3(b)(1), unless the authorization is terminated  or revoked sooner.       Influenza A by PCR NEGATIVE NEGATIVE Final   Influenza B by PCR NEGATIVE NEGATIVE Final    Comment: (NOTE) The Xpert Xpress SARS-CoV-2/FLU/RSV plus assay is intended as an aid in the diagnosis of influenza from Nasopharyngeal swab specimens and should not be used as a sole basis  for treatment. Nasal washings and aspirates are unacceptable for Xpert Xpress SARS-CoV-2/FLU/RSV testing.  Fact Sheet for Patients: EntrepreneurPulse.com.au  Fact Sheet for Healthcare Providers: IncredibleEmployment.be  This test is not yet approved or cleared by the [place] FDA and has been authorized for detection and/or diagnosis of SARS-CoV-2 by FDA under an Emergency Use Authorization (EUA). This EUA will remain in effect (meaning this test can be used) for the duration of the COVID-19 declaration under Section 564(b)(1) of the Act, 21 U.S.C. section 360bbb-3(b)(1), unless the authorization is terminated or revoked.     Resp Syncytial Virus by PCR NEGATIVE NEGATIVE Final    Comment: (NOTE) Fact Sheet for Patients: EntrepreneurPulse.com.au  Fact Sheet for Healthcare Providers: IncredibleEmployment.be  This test is not yet approved or cleared by the [place] FDA and has been authorized for detection and/or diagnosis of SARS-CoV-2 by FDA under an Emergency Use Authorization (EUA). This EUA will remain in effect (meaning this test can be used) for the duration of the COVID-19 declaration under Section 564(b)(1) of the Act, 21 U.S.C. section 360bbb-3(b)(1), unless the authorization is terminated or revoked.  Performed at [facility], [place] [address], [place], [place] [address]   Culture, blood (routine x 2)     Status: None (Preliminary result)   Collection Time: 03/15/22  9:37 AM   Specimen: BLOOD  Result Value Ref Range Status   Specimen Description   Final    BLOOD BLOOD RIGHT HAND Performed at [facility] [address] [place], [place] [address]    Special Requests   Final    AEROBIC BOTTLE ONLY Blood Culture adequate volume Performed at [facility] [address], [place], [place] [address]    Culture   Final    NO  GROWTH < 24 HOURS Performed at [facility] [address] [place], [place] [address]    Report Status PENDING  Incomplete         Radiology Studies: DG Chest Port 1 View  Result Date: 03/14/2022 CLINICAL DATA:  Weakness EXAM: PORTABLE CHEST 1 VIEW COMPARISON:  11/26/2021 CT FINDINGS: Cardiac shadow is enlarged but stable. The lungs are well aerated bilaterally. No focal infiltrate or sizable effusion is seen. No bony abnormality is noted. IMPRESSION: No active disease. Electronically Signed   By: Inez Catalina M.D.   On: 03/14/2022 20:46        Scheduled Meds:  acetaminophen  1,000 mg Oral BID   apixaban  5 mg Oral BID   Chlorhexidine Gluconate Cloth  6 each Topical Daily   docusate sodium  100 mg Oral Daily   DULoxetine  40 mg Oral Daily   levothyroxine  150 mcg Oral Q0600   metoprolol tartrate  12.5 mg Oral BID   polyethylene glycol  17 g Oral BID   polyethylene glycol  17 g Oral Daily   Continuous Infusions:  cefTRIAXone (ROCEPHIN)  IV       LOS: 0 days    Time spent: 35 minutes    Irine Seal, MD [facility]   To contact the attending provider between 7A-7P or the covering provider during after hours 7P-7A, please log into the web site [email or web address] and access using universal [place] password for that web site. If you do not have the password, please call the hospital operator.  03/16/2022, 8:16 PM

## 2022-03-16 NOTE — TOC Initial Note (Signed)
Transition of Care [facility]) - Initial/Assessment Note   Patient Details  Name: Carmen Haney MRN: [id number] Date of Birth: 11-25-1930  Transition of Care [facility]) CM/SW Contact:    Sherie Don, LCSW Phone Number: 03/16/2022, 10:50 AM  Clinical Narrative: PT evaluation recommended SNF. CSW met with patient and daughters, Jackquline Bosch and Clayton Bibles, to discuss recommendations. Patient and daughters agreeable to SNF and requested [facility] as patient has been there before. Daughters also had questions regarding care after rehab. CSW explained to patient and family what options are available (private duty care, ALF, etc.) and they are aware these will not be covered by insurance.  FL2 done; PASRR confirmed. Initial referral faxed out. TOC awaiting bed offers.  Expected Discharge Plan: Skilled Nursing Facility Barriers to Discharge: Continued Medical Work up  Patient Goals and CMS Choice Patient states their goals for this hospitalization and ongoing recovery are:: Go to SNF CMS Medicare.gov Compare Post Acute Care list provided to:: Patient Choice offered to / list presented to : Patient  Expected Discharge Plan and Services In-house Referral: Clinical Social Work Post Acute Care Choice: [place] Living arrangements for the past 2 months: [place]             DME Arranged: N/A DME Agency: NA  Prior Living Arrangements/Services Living arrangements for the past 2 months: [place] Patient language and need for interpreter reviewed:: Yes Do you feel safe going back to the place where you live?: Yes      Need for Family Participation in Patient Care: Yes (Comment) Care giver support system in place?: Yes (comment) Criminal Activity/Legal Involvement Pertinent to Current Situation/Hospitalization: No - Comment as needed  Activities of Daily Living Home Assistive Devices/Equipment: [place] [facility] bed ADL Screening (condition at time of  admission) Patient's cognitive ability adequate to safely complete daily activities?: Yes Is the patient deaf or have difficulty hearing?: Yes Does the patient have difficulty seeing, even when wearing glasses/contacts?: No Does the patient have difficulty concentrating, remembering, or making decisions?: No Patient able to express need for assistance with ADLs?: Yes Does the patient have difficulty dressing or bathing?: Yes Independently performs ADLs?: No Communication: Independent Is this a change from baseline?: Pre-admission baseline Dressing (OT): Dependent Is this a change from baseline?: Pre-admission baseline Grooming: Dependent Is this a change from baseline?: Pre-admission baseline Feeding: Needs assistance Is this a change from baseline?: Pre-admission baseline Bathing: Dependent Is this a change from baseline?: Pre-admission baseline Toileting: Dependent Is this a change from baseline?: Pre-admission baseline In/Out Bed: Dependent Is this a change from baseline?: Pre-admission baseline Walks in Home: Dependent Does the patient have difficulty walking or climbing stairs?: Yes Weakness of Legs: Both Weakness of Arms/Hands: Both  Permission Sought/Granted Permission sought to share information with : Facility Art therapist granted to share information with : Yes, Verbal Permission Granted Permission granted to share info w AGENCY: SNFs  Emotional Assessment Appearance:: Appears stated age Attitude/Demeanor/Rapport: Engaged Affect (typically observed): Accepting, Appropriate Orientation: : Oriented to Self, Oriented to Place, Oriented to  Time, Oriented to Situation Alcohol / Substance Use: Not Applicable Psych Involvement: No (comment)  Admission diagnosis:  UTI (urinary tract infection) [N39.0] Weakness [R53.1] Urinary tract infection without hematuria, site unspecified [N39.0] Patient Active Problem List   Diagnosis Date Noted   UTI  (urinary tract infection) 03/15/2022   Debility 03/15/2022   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 02/13/2022   Lower extremity weakness  02/10/2022   Fracture of right proximal fibula 11/27/2021   T12 compression fracture ([place]) 11/27/2021   Effusion of bursa of left knee 11/27/2021   Fall 99991111   Diastolic dysfunction 99991111   Hemangioma of skin and subcutaneous tissue 02/19/2021   History of malignant neoplasm of skin 02/19/2021   Intertrigo 02/19/2021   Other seborrheic keratosis 02/19/2021   Atrial fibrillation ([place]) 02/16/2021   Atrial fibrillation with RVR ([place]) 02/15/2021   Degeneration of lumbar intervertebral disc 10/02/2020   Low back pain 10/02/2020   Personal history of colonic polyps 08/21/2020   Other specified symptoms and signs involving the circulatory and respiratory systems 08/21/2020   Other abnormalities of gait and mobility 08/21/2020   Anxiety and depression 08/21/2020   Chronic kidney disease (CKD), stage III (moderate) ([place]) 08/21/2020   Osteoporosis 08/21/2020   Abnormal digestive tract function 08/21/2020   Chronic pain of right knee 08/15/2019   Vertebral fracture, pathological    Hypothyroidism (acquired)    Personal history of radiation therapy    Breast CA ([place])    Genetic testing 10/22/2016   Sensorineural hearing loss (SNHL), bilateral 07/19/2015   Anxiety disorder 07/19/2015   Sclerosing adenosis of left breast 11/15/2014   Bruit 08/03/2013   Family history of malignant neoplasm of breast 07/28/2013   Neoplasm of right breast, primary tumor staging category Tis: ductal carcinoma in situ (DCIS) 10/20/2012   Obesities, morbid ([place])    HTN (hypertension)    Hyperlipidemia    CAD (coronary artery disease) 07/13/1998   PCP:  [name], NP Pharmacy:   [facility] [id number], [place] [address] Phone: [phone] [phone]  Social Determinants of Health (SDOH) Social History: SDOH Screenings   Food Insecurity: No Food Insecurity (03/15/2022)  Housing: Low Risk  (03/15/2022)  Transportation Needs: No Transportation Needs (03/15/2022)  Utilities: Not At Risk (03/15/2022)  Alcohol Screen: Low Risk  (02/23/2022)  Depression (PHQ2-9): Low Risk  (02/23/2022)  Financial Resource Strain: Low Risk  (02/23/2022)  Physical Activity: Inactive (02/23/2022)  Social Connections: Socially Isolated (02/23/2022)  Stress: Stress Concern Present (02/23/2022)  Tobacco Use: Medium Risk (03/14/2022)   SDOH Interventions: Food Insecurity Interventions: Intervention Not Indicated Housing Interventions: Intervention Not Indicated Transportation Interventions: Intervention Not Indicated Utilities Interventions: Intervention Not Indicated  Readmission Risk Interventions    11/27/2021    1:51 PM  Readmission Risk Prevention Plan  Transportation Screening Complete  PCP or Specialist Appt within 5-7 Days Complete  Home Care Screening Complete  Medication Review (RN CM) Complete

## 2022-03-16 NOTE — Evaluation (Signed)
Physical Therapy Evaluation Patient Details Name: Carmen Haney MRN: [id number] DOB: 10-Apr-1930 Today's Date: 03/16/2022  History of Present Illness  89 y.o. 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     Pt has needed foley placement for urinary retention recently.     Over the past 1 week, pt with worsened generalized weakness.  Not only unable to ambulate but now unable to assist with transfers.  Daughter unable to care for pt at home.     Work up in ED is suspicious for possible acute UTI in setting of foley catheter superimposed on stiff person syndrome.  Clinical Impression  Pt admitted with above diagnosis. +2 total assist for supine to sit, pt sat edge of bed for ~15 minutes with intermittent loss of balance posteriorly. Attempted sit to stand x 2 with Stedy, pt unable to clear buttocks from bed. Batteries for maxi move were not charged so we were unable to transfer her to chair. I plugged in the batteries and we left sling under pt in bed, requested RN assist pt with maxi move transfer to recliner later today once batteries are charged. ST-SNF recommended, pt & 2 daughters agreeable.  Pt currently with functional limitations due to the deficits listed below (see PT Problem List). Pt will benefit from skilled PT to increase their independence and safety with mobility to allow discharge to the venue listed below.          Recommendations for follow up therapy are one component of a multi-disciplinary discharge planning process, led by the attending physician.  Recommendations may be updated based on patient status, additional functional criteria and insurance authorization.  Follow Up Recommendations Skilled nursing-short term rehab (<3 hours/day) Can patient physically be transported by private vehicle: No    Assistance Recommended at Discharge    Patient can return home with the following  Two people to help  with walking and/or transfers;Two people to help with bathing/dressing/bathroom;Assistance with cooking/housework;Assist for transportation;Help with stairs or ramp for entrance    Equipment Recommendations None recommended by PT  Recommendations for Other Services       Functional Status Assessment Patient has had a recent decline in their functional status and demonstrates the ability to make significant improvements in function in a reasonable and predictable amount of time.     Precautions / Restrictions Precautions Precautions: Fall Precaution Comments: stiffman syndrome Restrictions Weight Bearing Restrictions: No RLE Weight Bearing: Non weight bearing LLE Weight Bearing: Non weight bearing      Mobility  Bed Mobility Overal bed mobility: Needs Assistance Bed Mobility: Supine to Sit, Sit to Supine, Rolling Rolling: Mod assist   Supine to sit: Total assist, +2 for physical assistance Sit to supine: Total assist, +2 for physical assistance   General bed mobility comments: bed pad used to raise trunk and pivot hips to edge of bed; pt able to assist with reaching for rail to roll R for positioning at end of session.    Transfers                   General transfer comment: attempted sit to stand with STedy, pt unable to clear hips from bed with +2 max assist. Mechanical lift recommended, attempted to use maxi move but none of the batteries were charged. I plugged in the batteries and requested RN attempt to transfer pt to recliner later, maxi move sling left under pt in  bed.    Ambulation/Gait                  Stairs            Wheelchair Mobility    Modified Rankin (Stroke Patients Only)       Balance Overall balance assessment: Needs assistance Sitting-balance support: Feet supported, Single extremity supported Sitting balance-Leahy Scale: Poor Sitting balance - Comments: intermittent posterior lean when pt doesn't have single UE support,  kyphotic posture sitting Postural control: Posterior lean     Standing balance comment: unable                             Pertinent Vitals/Pain Pain Assessment Pain Assessment: Faces Faces Pain Scale: Hurts little more Pain Location: sacrum Pain Descriptors / Indicators: Grimacing Pain Intervention(s): Limited activity within patient's tolerance, Monitored during session, Repositioned    Home Living Family/patient expects to be discharged to:: Private residence Living Arrangements: Children Available Help at Discharge: Family;Available 24 hours/day Type of Home: House Home Access: Ramped entrance       Home Layout: Two level;Able to live on main level with bedroom/bathroom Home Equipment: Rollator (4 wheels);BSC/3in1;Tub bench;Toilet riser;Transport chair Additional Comments: pt with adjustable bed, STEDY, Hoyer (but it doesn't fit under her furniture)    Prior Function Prior Level of Function : Needs assist       Physical Assist : Mobility (physical)     Mobility Comments: pt was ambulating with rollator Nov 2023, since then has become progressively weaker, currently using STEDY to transfer at home and not amb in several weeks ADLs Comments: assistance with all self care tasks     Hand Dominance   Dominant Hand: Right    Extremity/Trunk Assessment   Upper Extremity Assessment Upper Extremity Assessment: Overall WFL for tasks assessed (patient reported consistent soreness in shoulders and arms with touch. patient ROM WFL.  Simultaneous filing. User may not have seen previous data.)    Lower Extremity Assessment Lower Extremity Assessment: Defer to PT evaluation (Simultaneous filing. User may not have seen previous data.) RLE Deficits / Details: knee ext +2/5, ankle DF AROM -20* RLE Sensation: decreased light touch LLE Deficits / Details: knee ext +2/5, ankle DF AROM -20* LLE Sensation: decreased light touch    Cervical / Trunk Assessment Cervical  / Trunk Assessment: Kyphotic (Simultaneous filing. User may not have seen previous data.)  Communication   Communication: HOH  Cognition Arousal/Alertness: Awake/alert Behavior During Therapy: Anxious Overall Cognitive Status: Within Functional Limits for tasks assessed                                 General Comments: oriented x 4        General Comments      Exercises     Assessment/Plan    PT Assessment Patient needs continued PT services  PT Problem List         PT Treatment Interventions Functional mobility training;Therapeutic activities;Therapeutic exercise;Balance training;Patient/family education;DME instruction    PT Goals (Current goals can be found in the Care Plan section)  Acute Rehab PT Goals Patient Stated Goal: to be able to transfer to a chair, tolerate sitting edge of bed PT Goal Formulation: With patient/family Time For Goal Achievement: 03/30/22 Potential to Achieve Goals: Fair    Frequency Min 2X/week     Co-evaluation PT/OT/SLP Co-Evaluation/Treatment: Yes Reason for Co-Treatment:  To address functional/ADL transfers;For patient/therapist safety;Complexity of the patient's impairments (multi-system involvement) PT goals addressed during session: Mobility/safety with mobility;Balance OT goals addressed during session: ADL's and self-care       AM-PAC PT "6 Clicks" Mobility  Outcome Measure Help needed turning from your back to your side while in a flat bed without using bedrails?: A Lot Help needed moving from lying on your back to sitting on the side of a flat bed without using bedrails?: Total Help needed moving to and from a bed to a chair (including a wheelchair)?: Total Help needed standing up from a chair using your arms (e.g., wheelchair or bedside chair)?: Total Help needed to walk in hospital room?: Total Help needed climbing 3-5 steps with a railing? : Total 6 Click Score: 7    End of Session Equipment Utilized  During Treatment: Gait belt Activity Tolerance: Patient limited by fatigue Patient left: in bed;with family/visitor present;with call bell/phone within reach Nurse Communication: Mobility status;Need for lift equipment PT Visit Diagnosis: Muscle weakness (generalized) (M62.81);Pain;Difficulty in walking, not elsewhere classified (R26.2)    Time: [id number] PT Time Calculation (min) (ACUTE ONLY): 50 min   Charges:   PT Evaluation $PT Eval Moderate Complexity: 1 Mod PT Treatments $Therapeutic Activity: 23-37 mins        Blondell Reveal Kistler PT 03/16/2022  Acute Rehabilitation Services  Office [phone]

## 2022-03-16 NOTE — NC FL2 (Signed)
[place] MEDICAID FL2 LEVEL OF CARE FORM     IDENTIFICATION  Patient Name: Carmen Haney Birthdate: [birth year] Sex: female Admission Date (Current Location): 03/14/2022  [facility] and [place] Number:  Herbalist and Address:  [facility],  [address] [place], [place]      Provider Number: [id number]  Attending Physician Name and Address:  Eugenie Filler, MD  Relative Name and Phone Number:  Jackquline Bosch (daughter) Ph: [phone]    Current Level of Care: Hospital Recommended Level of Care: [place] Prior Approval Number:    Date Approved/Denied:   PASRR Number: [id number]  Discharge Plan: SNF    Current Diagnoses: Patient Active Problem List   Diagnosis Date Noted   UTI (urinary tract infection) 03/15/2022   Debility 03/15/2022   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 02/13/2022   Lower extremity weakness 02/10/2022   Fracture of right proximal fibula 11/27/2021   T12 compression fracture ([place]) 11/27/2021   Effusion of bursa of left knee 11/27/2021   Fall 99991111   Diastolic dysfunction 99991111   Hemangioma of skin and subcutaneous tissue 02/19/2021   History of malignant neoplasm of skin 02/19/2021   Intertrigo 02/19/2021   Other seborrheic keratosis 02/19/2021   Atrial fibrillation ([place]) 02/16/2021   Atrial fibrillation with RVR ([place]) 02/15/2021   Degeneration of lumbar intervertebral disc 10/02/2020   Low back pain 10/02/2020   Personal history of colonic polyps 08/21/2020   Other specified symptoms and signs involving the circulatory and respiratory systems 08/21/2020   Other abnormalities of gait and mobility 08/21/2020   Anxiety and depression 08/21/2020   Chronic kidney disease (CKD), stage III (moderate) (HCC) 08/21/2020   Osteoporosis 08/21/2020   Abnormal digestive tract function 08/21/2020   Chronic pain of right knee 08/15/2019   Vertebral fracture,  pathological    Hypothyroidism (acquired)    Personal history of radiation therapy    Breast CA ([place])    Genetic testing 10/22/2016   Sensorineural hearing loss (SNHL), bilateral 07/19/2015   Anxiety disorder 07/19/2015   Sclerosing adenosis of left breast 11/15/2014   Bruit 08/03/2013   Family history of malignant neoplasm of breast 07/28/2013   Neoplasm of right breast, primary tumor staging category Tis: ductal carcinoma in situ (DCIS) 10/20/2012   Obesities, morbid (HCC)    HTN (hypertension)    Hyperlipidemia    CAD (coronary artery disease) 07/13/1998    Orientation RESPIRATION BLADDER Height & Weight     Self, Time, Situation, Place  Normal Continent Weight: 200 lb (90.7 kg) Height:  '5\' 3"'$  (160 cm)  BEHAVIORAL SYMPTOMS/MOOD NEUROLOGICAL BOWEL NUTRITION STATUS   (N/A)  (N/A) Continent Diet (Heart healthy diet)  AMBULATORY STATUS COMMUNICATION OF NEEDS Skin   Extensive Assist Verbally Other (Comment) (Rash: left breast; Erythema: bilateral legs)                       Personal Care Assistance Level of Assistance  Bathing, Feeding, Dressing Bathing Assistance: Maximum assistance Feeding assistance: Limited assistance Dressing Assistance: Maximum assistance     Functional Limitations Info  Sight, Hearing, Speech Sight Info: Adequate Hearing Info: Impaired Speech Info: Adequate    SPECIAL CARE FACTORS FREQUENCY  PT (By licensed PT), OT (By licensed OT)     PT Frequency: 5x's/week OT Frequency: 5x's/week            Contractures Contractures Info: Not present    Additional Factors Info  Code Status, Allergies, Psychotropic Code Status Info: Full Allergies Info: Ace Inhibitors, Latex Psychotropic Info: Cymbalta, Valium         Current Medications (03/16/2022):  This is the current hospital active medication list Current Facility-Administered Medications  Medication Dose Route Frequency Provider Last Rate Last Admin   acetaminophen (TYLENOL) tablet  650 mg  650 mg Oral Q6H PRN Etta Quill, DO   650 mg at 03/16/22 1047   Or   acetaminophen (TYLENOL) suppository 650 mg  650 mg Rectal Q6H PRN Etta Quill, DO       acetaminophen (TYLENOL) tablet 1,000 mg  1,000 mg Oral BID Eugenie Filler, MD   1,000 mg at 03/16/22 1048   apixaban (ELIQUIS) tablet 5 mg  5 mg Oral BID Etta Quill, DO   5 mg at 03/16/22 0908   bisacodyl (DULCOLAX) EC tablet 5 mg  5 mg Oral Daily PRN Etta Quill, DO       brexpiprazole (REXULTI) tablet 0.125 mg  0.125 mg Oral Daily Eugenie Filler, MD       cefTRIAXone (ROCEPHIN) 2 g in sodium chloride 0.9 % 100 mL IVPB  2 g Intravenous Q24H Eugenie Filler, MD       Chlorhexidine Gluconate Cloth 2 % PADS 6 each  6 each Topical Daily Eugenie Filler, MD   6 each at 03/16/22 [id number]   diazepam (VALIUM) tablet 5 mg  5 mg Oral Q6H PRN Etta Quill, DO   5 mg at 03/16/22 0551   docusate sodium (COLACE) capsule 100 mg  100 mg Oral Daily Eugenie Filler, MD   100 mg at 03/16/22 1047   DULoxetine (CYMBALTA) DR capsule 40 mg  40 mg Oral Daily Eugenie Filler, MD   40 mg at 03/16/22 1047   levothyroxine (SYNTHROID) tablet 150 mcg  150 mcg Oral Q0600 Etta Quill, DO   150 mcg at 03/16/22 0551   metoprolol tartrate (LOPRESSOR) tablet 12.5 mg  12.5 mg Oral BID Etta Quill, DO   12.5 mg at 03/16/22 0908   ondansetron (ZOFRAN) tablet 4 mg  4 mg Oral Q6H PRN Etta Quill, DO       Or   ondansetron [facility]) injection 4 mg  4 mg Intravenous Q6H PRN Etta Quill, DO       polyethylene glycol (MIRALAX / GLYCOLAX) packet 17 g  17 g Oral BID Jennette Kettle M, DO   17 g at 03/16/22 0908   polyethylene glycol (MIRALAX / GLYCOLAX) packet 17 g  17 g Oral Daily Eugenie Filler, MD         Discharge Medications: Please see discharge summary for a list of discharge medications.  Relevant Imaging Results:  Relevant Lab Results:   Additional Information SSN: [id number]  Sherie Don,  LCSW

## 2022-03-16 NOTE — Evaluation (Signed)
Occupational Therapy Evaluation Patient Details Name: Carmen Haney MRN: [id number] DOB: Aug 14, 1930 Today's Date: 03/16/2022   History of Present Illness Patient is a 89 year old female who presented on 3/2 with 1 week history of weakness. Patient was found to have UTI and debility. PMH: a fib, stiff person syndrome, breast cancer, hearing loss, morbid obesity, vertebral fracture.   Clinical Impression   Patient is a 89 year old female who was admitted for above.  Patient was living at home with daughter support prior level. Currently, patient is +2 for bed mobility with inability to transition into standing with +2 A on STEDY. Patient was noted to have a lot of anxiousness with participation in any movement during session. Patients two daughters were present during session. Patients daughter is unable to physically support patient at this level with recommendations for ST rehab to increase patients aiblity to engage in ADLs and prevent learned helplessness.      Recommendations for follow up therapy are one component of a multi-disciplinary discharge planning process, led by the attending physician.  Recommendations may be updated based on patient status, additional functional criteria and insurance authorization.   Follow Up Recommendations  Skilled nursing-short term rehab (<3 hours/day)     Assistance Recommended at Discharge Frequent or constant Supervision/Assistance  Patient can return home with the following Two people to help with bathing/dressing/bathroom;Assist for transportation;Assistance with cooking/housework;Help with stairs or ramp for entrance;Two people to help with walking and/or transfers;Direct supervision/assist for financial management;Direct supervision/assist for medications management    Functional Status Assessment  Patient has had a recent decline in their functional status and demonstrates the ability to make significant improvements in function in a reasonable  and predictable amount of time.  Equipment Recommendations          Precautions / Restrictions Precautions Precautions: Fall Precaution Comments: stiffman syndrome Restrictions Weight Bearing Restrictions: No RLE Weight Bearing: Non weight bearing LLE Weight Bearing: Non weight bearing      Mobility Bed Mobility Overal bed mobility: Needs Assistance Bed Mobility: Supine to Sit, Sit to Supine, Rolling Rolling: Mod assist   Supine to sit: Total assist, +2 for physical assistance Sit to supine: Total assist, +2 for physical assistance   General bed mobility comments: bed pad used to raise trunk and pivot hips to edge of bed; pt able to assist with reaching for rail to roll R for positioning at end of session.        Balance Overall balance assessment: Needs assistance Sitting-balance support: Feet supported, Single extremity supported Sitting balance-Leahy Scale: Poor Sitting balance - Comments: intermittent posterior lean when pt doesn't have single UE support, kyphotic posture sitting Postural control: Posterior lean           ADL either performed or assessed with clinical judgement   ADL Overall ADL's : Needs assistance/impaired Eating/Feeding: Set up Eating/Feeding Details (indicate cue type and reason): sitting in chair position in bed. Grooming: Bed level;Wash/dry face;Minimal assistance   Upper Body Bathing: Bed level;Moderate assistance   Lower Body Bathing: Bed level;Total assistance   Upper Body Dressing : Bed level;Moderate assistance Upper Body Dressing Details (indicate cue type and reason): patient noted to have kyphotic posture sitting EOB with posterior pelvic tilt with increased time sititng. patient was able to preform shoudler retraction with noted posterior leaning with attempt with physical A to maitnain balance. Lower Body Dressing: Bed level;Total assistance Lower Body Dressing Details (indicate cue type and reason): patient was educated on  deep breathing  strategies and picturing happy place to avoid tensing with anticipatory movements. all movements made patient increasingly anxious with various strategies trialed with patient with patient uanble to continue each one.   Toilet Transfer Details (indicate cue type and reason): unable attempted standing with +2 and STEDY with inability to clear bottom off bed with +2 attempts made. patient was educated on leaning foreards nose over toes stragey to attempt standing. patient verblized understanding and needed physical A to participate in it.           General ADL Comments: Patients daughter showed a picture of patients wheelchair and patients positioning in wheelchair at home. Patients daughter who is an OT was educated on looking at [place] cushion to help with posterior tilt in w/c noted in picture. Patient's daughter was educated on beign aware of feet being able to touch floor as well. patients daughter verbalzied understanding and reported she would look into cushion.     Vision Patient Visual Report: No change from baseline           Hand Dominance Right   Extremity/Trunk Assessment Upper Extremity Assessment Upper Extremity Assessment: Overall WFL for tasks assessed (patient reported consistent soreness in shoulders and arms with touch. patient ROM WFL.  Simultaneous filing. User may not have seen previous data.)   Lower Extremity Assessment Lower Extremity Assessment: Defer to PT evaluation (Simultaneous filing. User may not have seen previous data.) RLE Deficits / Details: knee ext +2/5, ankle DF AROM -20* RLE Sensation: decreased light touch LLE Deficits / Details: knee ext +2/5, ankle DF AROM -20* LLE Sensation: decreased light touch   Cervical / Trunk Assessment Cervical / Trunk Assessment: Kyphotic (Simultaneous filing. User may not have seen previous data.)   Communication Communication Communication: HOH   Cognition Arousal/Alertness:  Awake/alert Behavior During Therapy: Anxious Overall Cognitive Status: Within Functional Limits for tasks assessed       General Comments: patients two daughters present during session. patient was noted to be anxious with all movements. patient oriented to self, hospital, caregivers in room.                Home Living Family/patient expects to be discharged to:: Private residence Living Arrangements: Children Available Help at Discharge: Family;Available 24 hours/day Type of Home: House Home Access: Ramped entrance     Home Layout: Two level;Able to live on main level with bedroom/bathroom     Bathroom Shower/Tub: Teacher, early years/pre: Standard     Home Equipment: Rollator (4 wheels);BSC/3in1;Tub bench;Toilet riser;Transport chair   Additional Comments: pt with adjustable bed, STEDY, Hoyer (but it doesn't fit under her furniture)      Prior Functioning/Environment Prior Level of Function : Needs assist       Physical Assist : Mobility (physical)     Mobility Comments: pt was ambulating with rollator Nov 2023, since then has become progressively weaker, currently using STEDY to transfer at home and not amb in several weeks ADLs Comments: assistance with all self care tasks        OT Problem List: Decreased strength;Decreased activity tolerance;Decreased safety awareness;Impaired balance (sitting and/or standing);Decreased knowledge of use of DME or AE;Impaired UE functional use;Obesity      OT Treatment/Interventions: Self-care/ADL training;Therapeutic exercise;Therapeutic activities;Neuromuscular education;DME and/or AE instruction;Balance training;Patient/family education    OT Goals(Current goals can be found in the care plan section) Acute Rehab OT Goals Patient Stated Goal: to get better OT Goal Formulation: With patient/family Time For Goal Achievement: 03/30/22 Potential  to Achieve Goals: Fair  OT Frequency: Min 2X/week    Co-evaluation  PT/OT/SLP Co-Evaluation/Treatment: Yes Reason for Co-Treatment: To address functional/ADL transfers;For patient/therapist safety;Complexity of the patient's impairments (multi-system involvement) PT goals addressed during session: Mobility/safety with mobility;Balance OT goals addressed during session: ADL's and self-care      AM-PAC OT "6 Clicks" Daily Activity     Outcome Measure Help from another person eating meals?: A Little Help from another person taking care of personal grooming?: A Little Help from another person toileting, which includes using toliet, bedpan, or urinal?: Total Help from another person bathing (including washing, rinsing, drying)?: Total Help from another person to put on and taking off regular upper body clothing?: A Lot Help from another person to put on and taking off regular lower body clothing?: Total 6 Click Score: 11   End of Session Equipment Utilized During Treatment: Gait belt;Other (comment) (STEDY) Nurse Communication: Mobility status  Activity Tolerance: Other (comment) (anxiousness) Patient left: in bed;with call bell/phone within reach;with family/visitor present  OT Visit Diagnosis: Unsteadiness on feet (R26.81);Other abnormalities of gait and mobility (R26.89);Muscle weakness (generalized) (M62.81)                Time: [id number] OT Time Calculation (min): 49 min Charges:  OT General Charges $OT Visit: 1 Visit OT Evaluation $OT Eval Moderate Complexity: 1 Mod OT Treatments $Self Care/Home Management : 8-22 mins  Rennie Plowman, MS Acute Rehabilitation Department Office# [phone]   Willa Rough 03/16/2022, 1:12 PM

## 2022-03-16 NOTE — Telephone Encounter (Signed)
Pt's daughter called in and left a message with the access nurse on 03/14/22. Caller states their mother is more unable to move and the lift mesh broke and she is stuck in wheelchair unable to move with pain even after medication. Caller states pt has stiff person syndrome. Takes Valium '5mg'$  Q 6 hours as needed. Currently she is sitting in wheelchair crying and is begging for help because she is hurting so bad. Hoyer lift is broken. Had Valium 2 hours ago.  Full access nurse report is in Dr. Cathey Endow box

## 2022-03-16 NOTE — Telephone Encounter (Signed)
Gracie PT with [facility] called requesting verbal orders to recertify for 1 time a week for 9 weeks and then also requesting OT evaluation. Patient Is currently admitted to hospital. Would you like to approve these orders wait until patient has been discharged and seen in office.  Message routed to Windell Moulding, NP

## 2022-03-17 DIAGNOSIS — L03119 Cellulitis of unspecified part of limb: Secondary | ICD-10-CM

## 2022-03-17 DIAGNOSIS — L0390 Cellulitis, unspecified: Secondary | ICD-10-CM | POA: Diagnosis present

## 2022-03-17 LAB — URINE CULTURE: Culture: >=100000 — AB

## 2022-03-17 LAB — BASIC METABOLIC PANEL
Anion gap: 6 (ref 5–15)
BUN: 17 mg/dL (ref 8–23)
CO2: 30 mmol/L (ref 22–32)
Calcium: 8.4 mg/dL — ABNORMAL LOW (ref 8.9–10.3)
Chloride: 98 mmol/L (ref 98–111)
Creatinine, Ser: 0.71 mg/dL (ref 0.44–1.00)
GFR, Estimated: >60 mL/min (ref >60)
Glucose, Bld: 110 mg/dL — ABNORMAL HIGH (ref 70–99)
Potassium: 4.2 mmol/L (ref 3.5–5.1)
Sodium: 134 mmol/L — ABNORMAL LOW (ref 135–145)

## 2022-03-17 LAB — CBC WITH DIFFERENTIAL/PLATELET
Abs Immature Granulocytes: 0.03 10*3/uL (ref 0.00–0.07)
Basophils Absolute: 0.1 10*3/uL (ref 0.0–0.1)
Basophils Relative: 1 %
Eosinophils Absolute: 0.2 10*3/uL (ref 0.0–0.5)
Eosinophils Relative: 4 %
HCT: 34.9 % — ABNORMAL LOW (ref 36.0–46.0)
Hemoglobin: 11.2 g/dL — ABNORMAL LOW (ref 12.0–15.0)
Immature Granulocytes: 1 %
Lymphocytes Relative: 25 %
Lymphs Abs: 1.1 10*3/uL (ref 0.7–4.0)
MCH: 30.3 pg (ref 26.0–34.0)
MCHC: 32.1 g/dL (ref 30.0–36.0)
MCV: 94.3 fL (ref 80.0–100.0)
Monocytes Absolute: 0.6 10*3/uL (ref 0.1–1.0)
Monocytes Relative: 12 %
Neutro Abs: 2.6 10*3/uL (ref 1.7–7.7)
Neutrophils Relative %: 57 %
Platelets: 251 10*3/uL (ref 150–400)
RBC: 3.7 MIL/uL — ABNORMAL LOW (ref 3.87–5.11)
RDW: 14.6 % (ref 11.5–15.5)
WBC: 4.5 10*3/uL (ref 4.0–10.5)
nRBC: 0 % (ref 0.0–0.2)

## 2022-03-17 LAB — MAGNESIUM: Magnesium: 2.2 mg/dL (ref 1.7–2.4)

## 2022-03-17 MED ORDER — FOSFOMYCIN TROMETHAMINE 3 G PO PACK: 3.0000 g | PACK | Freq: Once | ORAL | Status: DC

## 2022-03-17 MED ORDER — CEPHALEXIN 500 MG PO CAPS
500.0000 mg | ORAL_CAPSULE | Freq: Three times a day (TID) | ORAL | Status: DC
  Administered 2022-03-18 – 2022-03-19 (×4): 500 mg via ORAL
  Filled 2022-03-17 (×4): qty 1

## 2022-03-17 MED ORDER — FOSFOMYCIN TROMETHAMINE 3 G PO PACK
3.0000 g | PACK | Freq: Once | ORAL | Status: AC
  Administered 2022-03-18: 3 g via ORAL
  Filled 2022-03-17: qty 3

## 2022-03-17 NOTE — Telephone Encounter (Signed)
I called and left detailed message with reply from Windell Moulding, NP

## 2022-03-17 NOTE — TOC Progression Note (Signed)
Transition of Care [facility]) - Progression Note    Patient Details  Name: Carmen Haney MRN: [id number] Date of Birth: May 26, 1930  Transition of Care [facility]) CM/SW Contact  Joaquin Courts, RN Phone Number: 03/17/2022, 3:39 PM  Clinical Narrative:    CM made 3 attempts to reach administrator at [facility] to update on potential discharge date.  Left HIPAA complaint VM on main line requesting call back.  Candace Cruise initiated and is pending at this time.   Expected Discharge Plan: [place] Barriers to Discharge: Continued Medical Work up  Expected Discharge Plan and Services In-house Referral: Clinical Social Work   Post Acute Care Choice: [place] Living arrangements for the past 2 months: Single Family Home                 DME Arranged: N/A DME Agency: NA                   Social Determinants of Health (SDOH) Interventions SDOH Screenings   Food Insecurity: No Food Insecurity (03/15/2022)  Housing: Low Risk  (03/15/2022)  Transportation Needs: No Transportation Needs (03/15/2022)  Utilities: Not At Risk (03/15/2022)  Alcohol Screen: Low Risk  (02/23/2022)  Depression (PHQ2-9): Low Risk  (02/23/2022)  Financial Resource Strain: Low Risk  (02/23/2022)  Physical Activity: Inactive (02/23/2022)  Social Connections: Socially Isolated (02/23/2022)  Stress: Stress Concern Present (02/23/2022)  Tobacco Use: Medium Risk (03/14/2022)    Readmission Risk Interventions    11/27/2021    1:51 PM  Readmission Risk Prevention Plan  Transportation Screening Complete  PCP or Specialist Appt within 5-7 Days Complete  Home Care Screening Complete  Medication Review (RN CM) Complete

## 2022-03-17 NOTE — TOC Progression Note (Signed)
Transition of Care [facility]) - Progression Note   Patient Details  Name: Carmen Haney MRN: [id number] Date of Birth: 04/16/1930  Transition of Care [facility]) CM/SW [place], LCSW Phone Number: 03/17/2022, 2:11 PM  Clinical Narrative: CSW left voicemail for Josh at [facility] regarding when the SNF bed will be available and requested call back.  Expected Discharge Plan: [place] Barriers to Discharge: Continued Medical Work up  Expected Discharge Plan and Services In-house Referral: Clinical Social Work Post Acute Care Choice: [place] Living arrangements for the past 2 months: Single Family Home           DME Arranged: N/A DME Agency: NA  Social Determinants of Health (SDOH) Interventions SDOH Screenings   Food Insecurity: No Food Insecurity (03/15/2022)  Housing: Low Risk  (03/15/2022)  Transportation Needs: No Transportation Needs (03/15/2022)  Utilities: Not At Risk (03/15/2022)  Alcohol Screen: Low Risk  (02/23/2022)  Depression (PHQ2-9): Low Risk  (02/23/2022)  Financial Resource Strain: Low Risk  (02/23/2022)  Physical Activity: Inactive (02/23/2022)  Social Connections: Socially Isolated (02/23/2022)  Stress: Stress Concern Present (02/23/2022)  Tobacco Use: Medium Risk (03/14/2022)   Readmission Risk Interventions    11/27/2021    1:51 PM  Readmission Risk Prevention Plan  Transportation Screening Complete  PCP or Specialist Appt within 5-7 Days Complete  Home Care Screening Complete  Medication Review (RN CM) Complete

## 2022-03-17 NOTE — Assessment & Plan Note (Deleted)
-  Patient with lower extremity cellulitis R > L..   -Cellulitis improving as noted on demarcation drawn. -Patient well sitting up in the recliner today noted to have some duskiness in feet however 2+ pedal pulses noted. -Once patient placed back in bed duskiness resolved. -Check ABIs with TBI. -Continue IV Rocephin through today and transition to oral Keflex tomorrow to complete a 5-day course of antibiotic treatment. -Supportive care.

## 2022-03-17 NOTE — Assessment & Plan Note (Signed)
PT/OT

## 2022-03-17 NOTE — Progress Notes (Signed)
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 -Urine cultures with > 100,000 colonies of E. coli with resistant to ampicillin, cefazolin, Unasyn however pansensitive to third-generation cephalosporins ciprofloxacin gentamicin meropenem Macrobid Bactrim, Zosyn.  -Continue IV Rocephin through today, and transition to oral antibiotics tomorrow.  -Patient seen by PT/OT who are recommending SNF placement. -TOC consulted for SNF placement.  UTI (urinary tract infection) Urine cultures with > 100,000 colonies of E. coli with resistance to ampicillin, cefazolin, Unasyn and sensitive to third-generation cephalosporins, fluoroquinolones, gentamicin, imipenem, Macrobid, Bactrim.   -Continue IV Rocephin through today and transition to 1 dose of oral fosfomycin tomorrow to complete a 5-day course of treatment.  -Supportive care.  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 Cont valium 3-4 times daily PRN Seems like she's needing it 4 times daily per neuro office note from last week Consider baclofen for spasms but this could worsen LE weakness per neuro office note. Continue current modalities and hold off on increasing Valium or starting baclofen at this time. Outpatient follow-up with neurology.  Atrial fibrillation (HCC) -Metoprolol for rate control.   -Eliquis  for anticoagulation.  Cellulitis -Patient with lower extremity cellulitis R > L..   -Cellulitis improving as noted on demarcation drawn. -Patient well sitting up in the recliner today noted to have some duskiness in feet however 2+ pedal pulses noted. -Once patient placed back in bed duskiness resolved. -Check ABIs with TBI. -Continue IV Rocephin through today and transition to oral Keflex tomorrow to complete a 5-day course of antibiotic treatment. -Supportive care.  Weakness -PT/OT.         DVT prophylaxis: Eliquis Code Status: Full Family Communication: Updated patient.  No family at bedside.  Disposition: Likely SNF  Status is: Inpatient The patient will require care spanning > 2 midnights and should be moved to inpatient because: Severity of illness   Consultants:  None  Procedures:  Chest x-ray 03/14/2022   Antimicrobials:  IV Rocephin 03/14/2022>>>> 03/17/2022 Oral fosfomycin x 1 03/18/2022 Oral Keflex 03/18/2022>>>>>>   Subjective: Sitting up in chair.  Still with generalized weakness.  Overall feeling a little bit better than she did on admission.   Objective: Vitals:   03/16/22 1411 03/16/22 2115 03/17/22 0655 03/17/22 1221  BP: 104/65 (!) 125/57 119/65 126/69  Pulse: 66 88 64 (!) 57  Resp: '16 18 18 17  '$ Temp: 97.8 F (36.6 C) 98.4 F (36.9 C) 97.7 F (36.5 C) 98.2 F (36.8 C)  TempSrc: Oral Oral Oral Oral  SpO2: 98% 94% 93% 97%  Weight:      Height:        Intake/Output Summary (Last 24 hours) at 03/17/2022 1838 Last data filed at 03/17/2022 1700 Gross per 24 hour  Intake 880.07 ml  Output 2350 ml  Net -1469.93 ml   Filed Weights   03/14/22  1945  Weight: 90.7 kg    Examination:  General exam: Appears calm and comfortable  Respiratory system: Clear to auscultation anterior lung fields.  No wheezes, no crackles, no rhonchi.  Fair air movement.  Speaking in full sentences.Marland Kitchen Respiratory effort normal. Cardiovascular system: S1 & S2 heard, RRR. No  JVD, murmurs, rubs, gallops or clicks. No pedal edema. Gastrointestinal system: Abdomen is nondistended, soft and nontender. No organomegaly or masses felt. Normal bowel sounds heard. Central nervous system: Alert and oriented. No focal neurological deficits. Extremities: Symmetric 5 x 5 power. Skin: Bilateral lower extremities right greater than left with decreasing erythema, decreased warmth, decreased tenderness to palpation.  Bilateral feet blanchable.  Somewhat dusky.  2+ pedal pulses.    Psychiatry: Judgement and insight appear normal. Mood & affect appropriate.     Data Reviewed:   CBC: Recent Labs  Lab 03/14/22 2220 03/15/22 0610 03/16/22 1015 03/17/22 0843  WBC 5.0 5.5 4.2 4.5  NEUTROABS 3.1  --  2.4 2.6  HGB 11.8* 11.3* 11.9* 11.2*  HCT 37.1 35.4* 36.9 34.9*  MCV 94.9 95.4 95.8 94.3  PLT 283 281 280 [id number]    Basic Metabolic Panel: Recent Labs  Lab 03/14/22 2220 03/15/22 0610 03/16/22 1015 03/17/22 0843  NA 137 131* 137 134*  K 3.9 3.9 4.0 4.2  CL 96* 97* 98 98  CO2 32 '29 30 30  '$ GLUCOSE 131* 117* 112* 110*  BUN '12 11 11 17  '$ CREATININE 0.74 0.50 0.72 0.71  CALCIUM 8.7* 8.1* 8.7* 8.4*  MG  --   --  2.1 2.2  PHOS  --   --  4.5  --     GFR: Estimated Creatinine Clearance: 51 mL/min (by C-G formula based on SCr of 0.71 mg/dL).  Liver Function Tests: Recent Labs  Lab 03/14/22 2220 03/16/22 1015  AST 18  --   ALT 14  --   ALKPHOS 81  --   BILITOT 0.5  --   PROT 6.6  --   ALBUMIN 3.4* 3.2*    CBG: No results for input(s): "GLUCAP" in the last 168 hours.   Recent Results (from the past 240 hour(s))  Culture, blood (routine x 2)     Status: None (Preliminary result)   Collection Time: 03/14/22 12:25 AM   Specimen: BLOOD  Result Value Ref Range Status   Specimen Description   Final    BLOOD SITE NOT SPECIFIED Performed at [facility], [address], [place], [place] [address]    Special Requests   Final    BOTTLES DRAWN AEROBIC AND  ANAEROBIC Blood Culture adequate volume Performed at [facility] [address], [place], [place] [address]    Culture   Final    NO GROWTH 2 DAYS Performed at [facility] [address], [place], [place] [address]    Report Status PENDING  Incomplete  Urine Culture (for pregnant, neutropenic or urologic patients or patients with an indwelling urinary catheter)     Status: Abnormal   Collection Time: 03/14/22 10:03 PM   Specimen: Urine, Catheterized  Result Value Ref Range Status   Specimen Description   Final    URINE, CATHETERIZED Performed at [facility] [address] [place], [place] [address]    Special Requests   Final    NONE Performed at [facility], [place] [address] [place], [place] [address]    Culture >=100,000 COLONIES/mL ESCHERICHIA COLI (A)  Final  Report Status 03/17/2022 FINAL  Final   Organism ID, Bacteria ESCHERICHIA COLI (A)  Final      Susceptibility   Escherichia coli - MIC*    AMPICILLIN >=32 RESISTANT Resistant     CEFAZOLIN >=64 RESISTANT Resistant     CEFEPIME <=0.12 SENSITIVE Sensitive     CEFTRIAXONE 0.5 SENSITIVE Sensitive     CIPROFLOXACIN <=0.25 SENSITIVE Sensitive     GENTAMICIN <=1 SENSITIVE Sensitive     IMIPENEM <=0.25 SENSITIVE Sensitive     NITROFURANTOIN <=16 SENSITIVE Sensitive     TRIMETH/SULFA <=20 SENSITIVE Sensitive     AMPICILLIN/SULBACTAM >=32 RESISTANT Resistant     PIP/TAZO 16 SENSITIVE Sensitive     * >=100,000 COLONIES/mL ESCHERICHIA COLI  Resp panel by RT-PCR (RSV, Flu A&B, Covid) Anterior Nasal Swab     Status: None   Collection Time: 03/14/22 10:55 PM   Specimen: Anterior Nasal Swab  Result Value Ref Range Status   SARS Coronavirus 2 by RT PCR NEGATIVE NEGATIVE Final    Comment: (NOTE) SARS-CoV-2 target nucleic acids are NOT DETECTED.  The SARS-CoV-2 RNA is generally detectable in upper respiratory specimens during the acute phase of infection. The  lowest concentration of SARS-CoV-2 viral copies this assay can detect is 138 copies/mL. A negative result does not preclude SARS-Cov-2 infection and should not be used as the sole basis for treatment or other patient management decisions. A negative result may occur with  improper specimen collection/handling, submission of specimen other than nasopharyngeal swab, presence of viral mutation(s) within the areas targeted by this assay, and inadequate number of viral copies(<138 copies/mL). A negative result must be combined with clinical observations, patient history, and epidemiological information. The expected result is Negative.  Fact Sheet for Patients:  EntrepreneurPulse.com.au  Fact Sheet for Healthcare Providers:  IncredibleEmployment.be  This test is no t yet approved or cleared by the [place] FDA and  has been authorized for detection and/or diagnosis of SARS-CoV-2 by FDA under an Emergency Use Authorization (EUA). This EUA will remain  in effect (meaning this test can be used) for the duration of the COVID-19 declaration under Section 564(b)(1) of the Act, 21 U.S.C.section 360bbb-3(b)(1), unless the authorization is terminated  or revoked sooner.       Influenza A by PCR NEGATIVE NEGATIVE Final   Influenza B by PCR NEGATIVE NEGATIVE Final    Comment: (NOTE) The Xpert Xpress SARS-CoV-2/FLU/RSV plus assay is intended as an aid in the diagnosis of influenza from Nasopharyngeal swab specimens and should not be used as a sole basis for treatment. Nasal washings and aspirates are unacceptable for Xpert Xpress SARS-CoV-2/FLU/RSV testing.  Fact Sheet for Patients: EntrepreneurPulse.com.au  Fact Sheet for Healthcare Providers: IncredibleEmployment.be  This test is not yet approved or cleared by the [place] FDA and has been authorized for detection and/or diagnosis of SARS-CoV-2 by FDA under  an Emergency Use Authorization (EUA). This EUA will remain in effect (meaning this test can be used) for the duration of the COVID-19 declaration under Section 564(b)(1) of the Act, 21 U.S.C. section 360bbb-3(b)(1), unless the authorization is terminated or revoked.     Resp Syncytial Virus by PCR NEGATIVE NEGATIVE Final    Comment: (NOTE) Fact Sheet for Patients: EntrepreneurPulse.com.au  Fact Sheet for Healthcare Providers: IncredibleEmployment.be  This test is not yet approved or cleared by the [place] FDA and has been authorized for detection and/or diagnosis of SARS-CoV-2 by FDA under an Emergency Use Authorization (EUA). This EUA will remain in effect (meaning  this test can be used) for the duration of the COVID-19 declaration under Section 564(b)(1) of the Act, 21 U.S.C. section 360bbb-3(b)(1), unless the authorization is terminated or revoked.  Performed at [facility], [place] [address] [place], [place] [address]   Culture, blood (routine x 2)     Status: None (Preliminary result)   Collection Time: 03/15/22  9:37 AM   Specimen: BLOOD  Result Value Ref Range Status   Specimen Description   Final    BLOOD BLOOD RIGHT HAND Performed at [facility] [address] [place], [place] [address]    Special Requests   Final    AEROBIC BOTTLE ONLY Blood Culture adequate volume Performed at [facility] [address], [place], [place] [address]    Culture   Final    NO GROWTH 2 DAYS Performed at [facility] [address] [place], [place] [address]    Report Status PENDING  Incomplete         Radiology Studies: No results found.      Scheduled Meds:  acetaminophen  1,000 mg Oral BID   apixaban  5 mg Oral BID   Chlorhexidine Gluconate Cloth  6 each Topical Daily   docusate sodium  100 mg Oral Daily   DULoxetine  40 mg Oral Daily   levothyroxine  150  mcg Oral Q0600   metoprolol tartrate  12.5 mg Oral BID   polyethylene glycol  17 g Oral BID   polyethylene glycol  17 g Oral Daily   Continuous Infusions:  cefTRIAXone (ROCEPHIN)  IV 2 g (03/16/22 2133)     LOS: 1 day    Time spent: 35 minutes    Irine Seal, MD [facility]   To contact the attending provider between 7A-7P or the covering provider during after hours 7P-7A, please log into the web site [email or web address] and access using universal [place] password for that web site. If you do not have the password, please call the hospital operator.  03/17/2022, 6:38 PM

## 2022-03-18 ENCOUNTER — Encounter (HOSPITAL_COMMUNITY): Payer: Medicare PPO

## 2022-03-18 DIAGNOSIS — R5381 Other malaise: Secondary | ICD-10-CM | POA: Diagnosis not present

## 2022-03-18 DIAGNOSIS — R531 Weakness: Secondary | ICD-10-CM | POA: Diagnosis not present

## 2022-03-18 DIAGNOSIS — G2582 Stiff-man syndrome: Secondary | ICD-10-CM | POA: Diagnosis not present

## 2022-03-18 DIAGNOSIS — I480 Paroxysmal atrial fibrillation: Secondary | ICD-10-CM | POA: Diagnosis not present

## 2022-03-18 MED ORDER — DULOXETINE HCL 20 MG PO CPEP: 40.0000 mg | ORAL_CAPSULE | Freq: Every day | ORAL | Status: DC

## 2022-03-18 MED ORDER — BACLOFEN 10 MG PO TABS
5.0000 mg | ORAL_TABLET | Freq: Three times a day (TID) | ORAL | Status: DC
  Administered 2022-03-18 – 2022-03-19 (×3): 5 mg via ORAL
  Filled 2022-03-18 (×3): qty 1

## 2022-03-18 NOTE — Assessment & Plan Note (Addendum)
Appears to be venous insufficiency changes.

## 2022-03-18 NOTE — Progress Notes (Signed)
  Progress Note   Patient: Carmen Haney [id number] DOB: 1930/11/11 DOA: 03/14/2022     2 DOS: the patient was seen and examined on 03/18/2022 at 9:28AM      Brief hospital course: Mrs. Birchett is an 89 y.o. F with HTN, stiff person syndrome and urinary retention who presented from SNF with progressive weakness and inability to ambulate.    Putative UTI diagnosed in ED, also dehydration.     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 Berdine Addison. Symptoms seems worse lately.  No way to know if this is worse due to dehdydration/UTI or lack of efficacy of Valium.  To me, family and patient are both clear that Valium seems not to be working well in the last week.  - Start baclofen today - Trial for 2-3 days - Wean Valium to TID PRN in that time - If improvement with baclofen, will titrate up and stop Valium - If no improvement with baclofen, will stop and resume Valium at prior dose - Dr. Berdine Addison will arrange follow up in next month    Cellulitis, ruled out Appears to be venous insufficiency changes.     Weakness - PT and OT  Debility Due to age, Sitiff man syndrome, worsened by dehydration and UTI  UTI (urinary tract infection) Asymptomatic other than expected symptoms from Foley, however E. coli in urine culture, in setting of Foley catheter.  If this is truly a UTI, it was likely due to the Foley catheter.   Completed 5 days Rocephin and 1 dose fosfomycin   Atrial fibrillation (HCC) Paroxysmal - Continue metoprolol and Eliquis           Subjective: Sitting up in recliner, no acute distress, no fever, no confusion.     Physical Exam: BP 107/62 (BP Location: Left Arm)   Pulse (!) 55   Temp 97.7 F (36.5 C) (Oral)   Resp 17   Ht '5\' 3"'$  (1.6 m)   Wt 90.7 kg   SpO2 96%   BMI 35.43 kg/m   Elderly adult female, lying in recliner, no acute distress RRR, no murmurs, no peripheral  edema Respiratory rate normal, lungs clear without rales or wheezes Abdomen soft without tenderness palpation or guarding Attention normal, affect open normal, hearing diminished, face symmetric, speech fluent, moves upper extremities with generalized weakness but symmetric strength Bilateral lower extremities have chronic venous stasis changes    Data Reviewed: Discussed with neurology, Dr. Daivd Council Basic metabolic panel and CBC unremarkable  Family Communication: Daughter by phone    Disposition: Status is: Inpatient To the best of my knowledge, the above diagnoses best describe the patient's illness, and all expected diagnostic testing that requires acute inpatient care has been completed.  At this point, the patient is medically ready to transition to an outpatient treatment plan, but because of their diminished functional status from baseline, age, and Stiff man syndrome, it is my medical opinion that discharge home at this time would pose a high risk unnecessary harm.  Safe disposition is being sought, and the patient will be ready to transition to that setting as soon as arranged.         Author: Edwin Dada, MD 03/18/2022 3:53 PM  For on call review [email or web address].

## 2022-03-18 NOTE — TOC Progression Note (Signed)
Transition of Care [facility]) - Progression Note   Patient Details  Name: Carmen Haney MRN: [id number] Date of Birth: [birth year]  Transition of [facility]) CM/SW [place], LCSW Phone Number: 03/18/2022, 10:17 AM  Clinical Narrative: CSW spoke with Merrily Pew at [facility] and confirmed a female bed will be available tomorrow. Patient will go too room 402. Insurance authorization was completed on the [employer] portal. Reference ID # is: [id number]. Patient has been approved for 03/18/2022-03/20/2022. Hospitalist and daughter, Jackquline Bosch, updated.   Expected Discharge Plan: [place] Barriers to Discharge: SNF Pending bed offer  Expected Discharge Plan and Services In-house Referral: Clinical Social Work Post Acute Care Choice: [place] Living arrangements for the past 2 months: Single Family Home           DME Arranged: N/A DME Agency: NA  Social Determinants of Health (SDOH) Interventions SDOH Screenings   Food Insecurity: No Food Insecurity (03/15/2022)  Housing: Low Risk  (03/15/2022)  Transportation Needs: No Transportation Needs (03/15/2022)  Utilities: Not At Risk (03/15/2022)  Alcohol Screen: Low Risk  (02/23/2022)  Depression (PHQ2-9): Low Risk  (02/23/2022)  Financial Resource Strain: Low Risk  (02/23/2022)  Physical Activity: Inactive (02/23/2022)  Social Connections: Socially Isolated (02/23/2022)  Stress: Stress Concern Present (02/23/2022)  Tobacco Use: Medium Risk (03/14/2022)   Readmission Risk Interventions    11/27/2021    1:51 PM  Readmission Risk Prevention Plan  Transportation Screening Complete  PCP or Specialist Appt within 5-7 Days Complete  Home Care Screening Complete  Medication Review (RN CM) Complete

## 2022-03-18 NOTE — Hospital Course (Addendum)
Carmen Haney is an 89 y.o. F with HTN, stiff person syndrome and urinary retention who presented from SNF with progressive weakness and inability to ambulate.    Putative UTI diagnosed in ED, also dehydration.

## 2022-03-19 DIAGNOSIS — Z7401 Bed confinement status: Secondary | ICD-10-CM | POA: Diagnosis not present

## 2022-03-19 DIAGNOSIS — R278 Other lack of coordination: Secondary | ICD-10-CM | POA: Diagnosis not present

## 2022-03-19 DIAGNOSIS — F32A Depression, unspecified: Secondary | ICD-10-CM | POA: Diagnosis not present

## 2022-03-19 DIAGNOSIS — E785 Hyperlipidemia, unspecified: Secondary | ICD-10-CM | POA: Diagnosis not present

## 2022-03-19 DIAGNOSIS — I4891 Unspecified atrial fibrillation: Secondary | ICD-10-CM | POA: Diagnosis not present

## 2022-03-19 DIAGNOSIS — Z743 Need for continuous supervision: Secondary | ICD-10-CM | POA: Diagnosis not present

## 2022-03-19 DIAGNOSIS — R531 Weakness: Secondary | ICD-10-CM | POA: Diagnosis not present

## 2022-03-19 DIAGNOSIS — N390 Urinary tract infection, site not specified: Secondary | ICD-10-CM | POA: Diagnosis not present

## 2022-03-19 DIAGNOSIS — E039 Hypothyroidism, unspecified: Secondary | ICD-10-CM | POA: Diagnosis not present

## 2022-03-19 DIAGNOSIS — I10 Essential (primary) hypertension: Secondary | ICD-10-CM | POA: Diagnosis not present

## 2022-03-19 DIAGNOSIS — I480 Paroxysmal atrial fibrillation: Secondary | ICD-10-CM | POA: Diagnosis not present

## 2022-03-19 DIAGNOSIS — G2582 Stiff-man syndrome: Secondary | ICD-10-CM | POA: Diagnosis not present

## 2022-03-19 DIAGNOSIS — F411 Generalized anxiety disorder: Secondary | ICD-10-CM | POA: Diagnosis not present

## 2022-03-19 DIAGNOSIS — R5381 Other malaise: Secondary | ICD-10-CM | POA: Diagnosis not present

## 2022-03-19 DIAGNOSIS — R339 Retention of urine, unspecified: Secondary | ICD-10-CM | POA: Diagnosis not present

## 2022-03-19 DIAGNOSIS — F419 Anxiety disorder, unspecified: Secondary | ICD-10-CM | POA: Diagnosis not present

## 2022-03-19 DIAGNOSIS — C50919 Malignant neoplasm of unspecified site of unspecified female breast: Secondary | ICD-10-CM | POA: Diagnosis not present

## 2022-03-19 DIAGNOSIS — M6281 Muscle weakness (generalized): Secondary | ICD-10-CM | POA: Diagnosis not present

## 2022-03-19 DIAGNOSIS — R760 Raised antibody titer: Secondary | ICD-10-CM | POA: Diagnosis not present

## 2022-03-19 DIAGNOSIS — F331 Major depressive disorder, recurrent, moderate: Secondary | ICD-10-CM | POA: Diagnosis not present

## 2022-03-19 MED ORDER — BACLOFEN 5 MG PO TABS: 5.0000 mg | ORAL_TABLET | Freq: Three times a day (TID) | ORAL | 0 refills | Status: DC

## 2022-03-19 MED ORDER — DULOXETINE HCL 40 MG PO CPEP: 40.0000 mg | ORAL_CAPSULE | Freq: Every day | ORAL | 3 refills | Status: DC

## 2022-03-19 MED ORDER — CEPHALEXIN 500 MG PO CAPS: 500.0000 mg | ORAL_CAPSULE | Freq: Three times a day (TID) | ORAL | Status: AC

## 2022-03-19 MED ORDER — DIAZEPAM 5 MG PO TABS: 5.0000 mg | ORAL_TABLET | Freq: Four times a day (QID) | ORAL | 0 refills | Status: DC | PRN

## 2022-03-19 NOTE — TOC Transition Note (Signed)
Transition of Care [facility]) - CM/SW Discharge Note  Patient Details  Name: Carmen Haney MRN: [id number] Date of Birth: 1930-12-05  Transition of Care [facility]) CM/SW Contact:  Sherie Don, LCSW Phone Number: 03/19/2022, 11:38 AM  Clinical Narrative: CSW confirmed bed with Josh at [facility]. The patient will go to room 402 and the number for report is [phone]. Discharge summary, discharge orders, and SNF transfer report faxed to facility in hub. Medical necessity form done; PTAR scheduled. Discharge packet completed. CSW updated daughter, [name], regarding transportation being set up. RN updated. TOC signing off.  Final next level of care: Skilled Nursing Facility Barriers to Discharge: Barriers Resolved  Patient Goals and CMS Choice CMS Medicare.gov Compare Post Acute Care list provided to:: Patient Choice offered to / list presented to : Patient  Discharge Placement Existing PASRR number confirmed : 03/16/22          Patient chooses bed at: [facility] Patient to be transferred to facility by: [facility] Name of family member notified: Jackquline Bosch (daughter) Patient and family notified of of transfer: 03/19/22  Discharge Plan and Services Additional resources added to the After Visit Summary for   In-house Referral: Clinical Social Work Post Acute Care Choice: [place]          DME Arranged: N/A DME Agency: NA  Social Determinants of Health ([place]) Interventions SDOH Screenings   Food Insecurity: No Food Insecurity (03/15/2022)  Housing: Low Risk  (03/15/2022)  Transportation Needs: No Transportation Needs (03/15/2022)  Utilities: Not At Risk (03/15/2022)  Alcohol Screen: Low Risk  (02/23/2022)  Depression (PHQ2-9): Low Risk  (02/23/2022)  Financial Resource Strain: Low Risk  (02/23/2022)  Physical Activity: Inactive (02/23/2022)  Social Connections: Socially Isolated (02/23/2022)  Stress: Stress Concern Present (02/23/2022)  Tobacco Use: Medium Risk (03/14/2022)    Readmission Risk Interventions    11/27/2021    1:51 PM  Readmission Risk Prevention Plan  Transportation Screening Complete  PCP or Specialist Appt within 5-7 Days Complete  Home Care Screening Complete  Medication Review (RN CM) Complete

## 2022-03-19 NOTE — Discharge Summary (Signed)
Physician Discharge Summary   Patient: NASIYA ELVIR MRN: [id number] DOB: Nov 26, 1930  Admit date:     03/14/2022  Discharge date: 03/19/22  Discharge Physician: Edwin Dada   PCP: [name], NP     Recommendations at discharge:  Follow up with Neurology Dr. Berdine Addison within 1 month [facility]: Please continue cephalexin 500 mg TID for UTI for four more days then stop Please monitor response to new baclofen, and contact Dr. 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 Devillers is an 89 y.o. F with HTN, stiff person syndrome and urinary retention who presented from SNF with progressive weakness and inability to ambulate.    Putative UTI diagnosed in ED, also dehydration.     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 Berdine Addison.  Recently diagnosed and started on Valium.  In the 7-10 prior to admission, patient had been having increased pain in limbs, muscle stiffness and difficulty ambulating despite Valium.  Discussed with Dr. Berdine Addison, who offered transitioning to second line therapy with baclofen.  Started baclofen 3/6 [facility]: - Please trial baclofen for next 2-3 days - If improvement in ambulation, function, limb stiffness/pain with baclofen -- then titrate up baclofen and stop Valium - If no improvement with baclofen, stop baclofen and resume Valium at prior dose - Dr. Berdine Addison will arrange follow up in next month    Cellulitis, ruled out Appears to be venous insufficiency changes.      UTI (urinary tract infection) Asymptomatic other than expected symptoms from Foley, however E. coli  in urine culture, in setting of Foley catheter.  If this is truly a UTI, it was likely due to the Foley catheter.   Will complete 4 more days cephalexin.             The [facility] Controlled Substances Registry was reviewed for this patient prior to discharge.  Consultants: None Procedures performed: None  Disposition: Skilled nursing facility Diet recommendation:  Regular diet  DISCHARGE MEDICATION: Allergies as of 03/19/2022       Reactions   Ace Inhibitors Cough   Latex Rash        Medication List     STOP taking these medications    nitrofurantoin (macrocrystal-monohydrate) 100 MG capsule Commonly known as: MACROBID   sertraline 100 MG tablet Commonly known as: ZOLOFT   sodium chloride 1 g tablet   traMADol 50 MG tablet Commonly known as: Ultram       TAKE these medications    acetaminophen 500 MG tablet Commonly known as: TYLENOL Take 1,000 mg by mouth in the morning and at bedtime.   apixaban 5 MG Tabs tablet Commonly known as: ELIQUIS Take 1 tablet (5 mg total) by mouth 2 (two) times daily.   Baclofen 5 MG Tabs Take 1 tablet (5 mg total) by mouth 3 (three) times daily.   brexpiprazole 0.25 MG Tabs tablet Commonly known as: Rexulti Take 1 tablet (0.25 mg total) by mouth daily. What changed:  how much to take additional instructions   CALCIUM/D3 ADULT GUMMIES PO Take 500 mg by mouth in the morning.   cephALEXin 500  MG capsule Commonly known as: KEFLEX Take 1 capsule (500 mg total) by mouth every 8 (eight) hours for 4 days.   diazepam 5 MG tablet Commonly known as: VALIUM Take 1 tablet (5 mg total) by mouth every 6 (six) hours as needed. What changed:  how much to take how to take this when to take this reasons to take this additional instructions   docusate sodium 100 MG capsule Commonly known as: COLACE Take 1 capsule (100 mg total) by mouth 2 (two) times daily. What changed: when to take this   DULoxetine HCl 40 MG  Cpep Take 1 capsule (40 mg total) by mouth at bedtime. What changed: when to take this   furosemide 20 MG tablet Commonly known as: LASIX Take 20 mg by mouth daily.   levothyroxine 150 MCG tablet Commonly known as: SYNTHROID Take 1 tablet (150 mcg total) by mouth daily before breakfast.   metoprolol tartrate 25 MG tablet Commonly known as: LOPRESSOR Take 0.5 tablets (12.5 mg total) by mouth 2 (two) times daily.   polyethylene glycol 17 g packet Commonly known as: MIRALAX / GLYCOLAX Take 17 g by mouth daily.   PROBIOTIC GUMMIES PO Take 2 tablets by mouth in the morning.        Contact information for follow-up providers     Shellia Carwin, MD Follow up.   Specialty: Neurology Contact information: [address] [place] [address] [phone]              Contact information for after-discharge care     Destination     [facility] .   Service: Skilled Nursing Contact information: [address] Test Update Address [place] [phone]                       Discharge Exam: Danley Danker Weights   03/14/22 1945  Weight: 90.7 kg    General: Pt is alert, awake, not in acute distress, sitting in recliner Cardiovascular: RRR, nl S1-S2, no murmurs appreciated.   No LE edema.   Respiratory: Normal respiratory rate and rhythm.  CTAB without rales or wheezes. Abdominal: Abdomen soft and non-tender.  No distension or HSM.   Neuro/Psych: Strength symmetric in upper and lower extremities but generally weak.  Judgment and insight appear normal.   Condition at discharge: stable  The results of significant diagnostics from this hospitalization (including imaging, microbiology, ancillary and laboratory) are listed below for reference.   Imaging Studies: DG Chest Port 1 View  Result Date: 03/14/2022 CLINICAL DATA:  Weakness EXAM: PORTABLE CHEST 1 VIEW COMPARISON:  11/26/2021 CT FINDINGS: Cardiac  shadow is enlarged but stable. The lungs are well aerated bilaterally. No focal infiltrate or sizable effusion is seen. No bony abnormality is noted. IMPRESSION: No active disease. Electronically Signed   By: Inez Catalina M.D.   On: 03/14/2022 20:46    Microbiology: Results for orders placed or performed during the hospital encounter of 03/14/22  Culture, blood (routine x 2)     Status: None (Preliminary result)   Collection Time: 03/14/22 12:25 AM   Specimen: BLOOD  Result Value Ref Range Status   Specimen Description   Final    BLOOD SITE NOT SPECIFIED Performed at [facility] [address] [place], [place] [address]    Special Requests   Final    BOTTLES DRAWN AEROBIC AND ANAEROBIC Blood Culture adequate volume Performed at [facility],  [place] [address], [place], [place] [address]    Culture   Final    NO GROWTH 4 DAYS Performed at [facility], [place] [address] [place], [place] [address]    Report Status PENDING  Incomplete  Urine Culture (for pregnant, neutropenic or urologic patients or patients with an indwelling urinary catheter)     Status: Abnormal   Collection Time: 03/14/22 10:03 PM   Specimen: Urine, Catheterized  Result Value Ref Range Status   Specimen Description   Final    URINE, CATHETERIZED Performed at [facility] [address] [place], [place] [address]    Special Requests   Final    NONE Performed at [facility], [place] [address] [place],  [address]    Culture >=100,000 COLONIES/mL ESCHERICHIA COLI (A)  Final   Report Status 03/17/2022 FINAL  Final   Organism ID, Bacteria ESCHERICHIA COLI (A)  Final      Susceptibility   Escherichia coli - MIC*    AMPICILLIN >=32 RESISTANT Resistant     CEFAZOLIN >=64 RESISTANT Resistant     CEFEPIME <=0.12 SENSITIVE Sensitive     CEFTRIAXONE 0.5 SENSITIVE Sensitive     CIPROFLOXACIN <=0.25 SENSITIVE Sensitive     GENTAMICIN <=1 SENSITIVE  Sensitive     IMIPENEM <=0.25 SENSITIVE Sensitive     NITROFURANTOIN <=16 SENSITIVE Sensitive     TRIMETH/SULFA <=20 SENSITIVE Sensitive     AMPICILLIN/SULBACTAM >=32 RESISTANT Resistant     PIP/TAZO 16 SENSITIVE Sensitive     * >=100,000 COLONIES/mL ESCHERICHIA COLI  Resp panel by RT-PCR (RSV, Flu A&B, Covid) Anterior Nasal Swab     Status: None   Collection Time: 03/14/22 10:55 PM   Specimen: Anterior Nasal Swab  Result Value Ref Range Status   SARS Coronavirus 2 by RT PCR NEGATIVE NEGATIVE Final    Comment: (NOTE) SARS-CoV-2 target nucleic acids are NOT DETECTED.  The SARS-CoV-2 RNA is generally detectable in upper respiratory specimens during the acute phase of infection. The lowest concentration of SARS-CoV-2 viral copies this assay can detect is 138 copies/mL. A negative result does not preclude SARS-Cov-2 infection and should not be used as the sole basis for treatment or other patient management decisions. A negative result may occur with  improper specimen collection/handling, submission of specimen other than nasopharyngeal swab, presence of viral mutation(s) within the areas targeted by this assay, and inadequate number of viral copies(<138 copies/mL). A negative result must be combined with clinical observations, patient history, and epidemiological information. The expected result is Negative.  Fact Sheet for Patients:  EntrepreneurPulse.com.au  Fact Sheet for Healthcare Providers:  IncredibleEmployment.be  This test is no t yet approved or cleared by the [place] FDA and  has been authorized for detection and/or diagnosis of SARS-CoV-2 by FDA under an Emergency Use Authorization (EUA). This EUA will remain  in effect (meaning this test can be used) for the duration of the COVID-19 declaration under Section 564(b)(1) of the Act, 21 U.S.C.section 360bbb-3(b)(1), unless the authorization is terminated  or revoked sooner.        Influenza A by PCR NEGATIVE NEGATIVE Final   Influenza B by PCR NEGATIVE NEGATIVE Final    Comment: (NOTE) The Xpert Xpress SARS-CoV-2/FLU/RSV plus assay is intended as an aid in the diagnosis of influenza from Nasopharyngeal swab specimens and should not be used as a sole basis for treatment. Nasal washings and aspirates are unacceptable for Xpert Xpress SARS-CoV-2/FLU/RSV testing.  Fact Sheet for  Patients: EntrepreneurPulse.com.au  Fact Sheet for Healthcare Providers: IncredibleEmployment.be  This test is not yet approved or cleared by the [place] FDA and has been authorized for detection and/or diagnosis of SARS-CoV-2 by FDA under an Emergency Use Authorization (EUA). This EUA will remain in effect (meaning this test can be used) for the duration of the COVID-19 declaration under Section 564(b)(1) of the Act, 21 U.S.C. section 360bbb-3(b)(1), unless the authorization is terminated or revoked.     Resp Syncytial Virus by PCR NEGATIVE NEGATIVE Final    Comment: (NOTE) Fact Sheet for Patients: EntrepreneurPulse.com.au  Fact Sheet for Healthcare Providers: IncredibleEmployment.be  This test is not yet approved or cleared by the [place] FDA and has been authorized for detection and/or diagnosis of SARS-CoV-2 by FDA under an Emergency Use Authorization (EUA). This EUA will remain in effect (meaning this test can be used) for the duration of the COVID-19 declaration under Section 564(b)(1) of the Act, 21 U.S.C. section 360bbb-3(b)(1), unless the authorization is terminated or revoked.  Performed at [facility], [place] [address] [place], [place] [address]   Culture, blood (routine x 2)     Status: None (Preliminary result)   Collection Time: 03/15/22  9:37 AM   Specimen: BLOOD  Result Value Ref Range Status   Specimen Description   Final    BLOOD BLOOD RIGHT  HAND Performed at [facility] [address] [place], [place] [address]    Special Requests   Final    AEROBIC BOTTLE ONLY Blood Culture adequate volume Performed at [facility] [address], [place], [place] [address]    Culture   Final    NO GROWTH 4 DAYS Performed at [facility], [place] [address] [place], [place] [address]    Report Status PENDING  Incomplete    Labs: CBC: Recent Labs  Lab 03/14/22 2220 03/15/22 0610 03/16/22 1015 03/17/22 0843  WBC 5.0 5.5 4.2 4.5  NEUTROABS 3.1  --  2.4 2.6  HGB 11.8* 11.3* 11.9* 11.2*  HCT 37.1 35.4* 36.9 34.9*  MCV 94.9 95.4 95.8 94.3  PLT 283 281 280 [id number]    Basic Metabolic Panel: Recent Labs  Lab 03/14/22 2220 03/15/22 0610 03/16/22 1015 03/17/22 0843  NA 137 131* 137 134*  K 3.9 3.9 4.0 4.2  CL 96* 97* 98 98  CO2 32 '29 30 30  '$ GLUCOSE 131* 117* 112* 110*  BUN '12 11 11 17  '$ CREATININE 0.74 0.50 0.72 0.71  CALCIUM 8.7* 8.1* 8.7* 8.4*  MG  --   --  2.1 2.2  PHOS  --   --  4.5  --     Liver Function Tests: Recent Labs  Lab 03/14/22 2220 03/16/22 1015  AST 18  --   ALT 14  --   ALKPHOS 81  --   BILITOT 0.5  --   PROT 6.6  --   ALBUMIN 3.4* 3.2*    CBG: No results for input(s): "GLUCAP" in the last 168 hours.  Discharge time spent: approximately 35 minutes spent on discharge counseling, evaluation of patient on day of discharge, and coordination of discharge planning with nursing, social work, pharmacy and case management  Signed: Edwin Dada, MD [facility] 03/19/2022

## 2022-03-19 NOTE — Discharge Planning (Deleted)
Physician Discharge Summary   Patient: Carmen Haney MRN: [id number] DOB: 01/05/1931  Admit date:     03/14/2022  Discharge date: 03/19/22  Discharge Physician: Edwin Dada   PCP: [name], NP     Recommendations at discharge:  Follow up with Neurology Dr. Berdine Addison within 1 month [facility]: Please continue cephalexin 500 mg TID for UTI for four more days then stop Please monitor response to new baclofen, and contact Dr. 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 F with HTN, stiff person syndrome and urinary retention who presented from SNF with progressive weakness and inability to ambulate.    Putative UTI diagnosed in ED, also dehydration.     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 Berdine Addison.  Recently diagnosed and started on Valium.  In the 7-10 prior to admission, patient had been having increased pain in limbs, muscle stiffness and difficulty ambulating despite Valium.  Discussed with Dr. Berdine Addison, who offered transitioning to second line therapy with baclofen.  Started baclofen 3/6 [facility]: - Please trial baclofen for next 2-3 days - If improvement in ambulation, function, limb stiffness/pain with baclofen -- then titrate up baclofen and stop Valium - If no improvement with baclofen, stop baclofen and resume Valium at prior dose - Dr. Berdine Addison will arrange follow up in next month    Cellulitis, ruled out Appears to be venous insufficiency changes.      UTI (urinary tract infection) Asymptomatic other than expected symptoms from Foley, however E. coli  in urine culture, in setting of Foley catheter.  If this is truly a UTI, it was likely due to the Foley catheter.   Will complete 4 more days cephalexin.             The [facility] Controlled Substances Registry was reviewed for this patient prior to discharge.  Consultants: None Procedures performed: None  Disposition: Skilled nursing facility Diet recommendation:  Regular diet  DISCHARGE MEDICATION: Allergies as of 03/19/2022       Reactions   Ace Inhibitors Cough   Latex Rash        Medication List     STOP taking these medications    nitrofurantoin (macrocrystal-monohydrate) 100 MG capsule Commonly known as: MACROBID   sertraline 100 MG tablet Commonly known as: ZOLOFT   sodium chloride 1 g tablet   traMADol 50 MG tablet Commonly known as: Ultram       TAKE these medications    acetaminophen 500 MG tablet Commonly known as: TYLENOL Take 1,000 mg by mouth in the morning and at bedtime.   apixaban 5 MG Tabs tablet Commonly known as: ELIQUIS Take 1 tablet (5 mg total) by mouth 2 (two) times daily.   Baclofen 5 MG Tabs Take 1 tablet (5 mg total) by mouth 3 (three) times daily.   brexpiprazole 0.25 MG Tabs tablet Commonly known as: Rexulti Take 1 tablet (0.25 mg total) by mouth daily. What changed:  how much to take additional instructions   CALCIUM/D3 ADULT GUMMIES PO Take 500 mg by mouth in the morning.   cephALEXin 500  MG capsule Commonly known as: KEFLEX Take 1 capsule (500 mg total) by mouth every 8 (eight) hours for 4 days.   diazepam 5 MG tablet Commonly known as: VALIUM Take 1 tablet (5 mg total) by mouth every 6 (six) hours as needed. What changed:  how much to take how to take this when to take this reasons to take this additional instructions   docusate sodium 100 MG capsule Commonly known as: COLACE Take 1 capsule (100 mg total) by mouth 2 (two) times daily. What changed: when to take this   DULoxetine HCl 40 MG  Cpep Take 1 capsule (40 mg total) by mouth at bedtime. What changed: when to take this   furosemide 20 MG tablet Commonly known as: LASIX Take 20 mg by mouth daily.   levothyroxine 150 MCG tablet Commonly known as: SYNTHROID Take 1 tablet (150 mcg total) by mouth daily before breakfast.   metoprolol tartrate 25 MG tablet Commonly known as: LOPRESSOR Take 0.5 tablets (12.5 mg total) by mouth 2 (two) times daily.   polyethylene glycol 17 g packet Commonly known as: MIRALAX / GLYCOLAX Take 17 g by mouth daily.   PROBIOTIC GUMMIES PO Take 2 tablets by mouth in the morning.        Contact information for follow-up providers     Shellia Carwin, MD Follow up.   Specialty: Neurology Contact information: [address] [place] [address] [phone]              Contact information for after-discharge care     Destination     [facility] .   Service: Skilled Nursing Contact information: [address] Test Update Address [place] [phone]                       Discharge Exam: Danley Danker Weights   03/14/22 1945  Weight: 90.7 kg    General: Carmen Haney is alert, awake, not in acute distress, sitting in recliner Cardiovascular: RRR, nl S1-S2, no murmurs appreciated.   No LE edema.   Respiratory: Normal respiratory rate and rhythm.  CTAB without rales or wheezes. Abdominal: Abdomen soft and non-tender.  No distension or HSM.   Neuro/Psych: Strength symmetric in upper and lower extremities but generally weak.  Judgment and insight appear normal.   Condition at discharge: stable  The results of significant diagnostics from this hospitalization (including imaging, microbiology, ancillary and laboratory) are listed below for reference.   Imaging Studies: DG Chest Port 1 View  Result Date: 03/14/2022 CLINICAL DATA:  Weakness EXAM: PORTABLE CHEST 1 VIEW COMPARISON:  11/26/2021 CT FINDINGS: Cardiac  shadow is enlarged but stable. The lungs are well aerated bilaterally. No focal infiltrate or sizable effusion is seen. No bony abnormality is noted. IMPRESSION: No active disease. Electronically Signed   By: Inez Catalina M.D.   On: 03/14/2022 20:46    Microbiology: Results for orders placed or performed during the hospital encounter of 03/14/22  Culture, blood (routine x 2)     Status: None (Preliminary result)   Collection Time: 03/14/22 12:25 AM   Specimen: BLOOD  Result Value Ref Range Status   Specimen Description   Final    BLOOD SITE NOT SPECIFIED Performed at [facility] [address] [place], [place] [address]    Special Requests   Final    BOTTLES DRAWN AEROBIC AND ANAEROBIC Blood Culture adequate volume Performed at [facility],  [place] [address], [place], [place] [address]    Culture   Final    NO GROWTH 4 DAYS Performed at [facility], [place] [address], [place], [place] [address]    Report Status PENDING  Incomplete  Urine Culture (for pregnant, neutropenic or urologic patients or patients with an indwelling urinary catheter)     Status: Abnormal   Collection Time: 03/14/22 10:03 PM   Specimen: Urine, Catheterized  Result Value Ref Range Status   Specimen Description   Final    URINE, CATHETERIZED Performed at [facility] [address] [place], [place] [address]    Special Requests   Final    NONE Performed at [facility], [place] [address] [place], [place] [address]    Culture >=100,000 COLONIES/mL ESCHERICHIA COLI (A)  Final   Report Status 03/17/2022 FINAL  Final   Organism ID, Bacteria ESCHERICHIA COLI (A)  Final      Susceptibility   Escherichia coli - MIC*    AMPICILLIN >=32 RESISTANT Resistant     CEFAZOLIN >=64 RESISTANT Resistant     CEFEPIME <=0.12 SENSITIVE Sensitive     CEFTRIAXONE 0.5 SENSITIVE Sensitive     CIPROFLOXACIN <=0.25 SENSITIVE Sensitive     GENTAMICIN <=1 SENSITIVE  Sensitive     IMIPENEM <=0.25 SENSITIVE Sensitive     NITROFURANTOIN <=16 SENSITIVE Sensitive     TRIMETH/SULFA <=20 SENSITIVE Sensitive     AMPICILLIN/SULBACTAM >=32 RESISTANT Resistant     PIP/TAZO 16 SENSITIVE Sensitive     * >=100,000 COLONIES/mL ESCHERICHIA COLI  Resp panel by RT-PCR (RSV, Flu A&B, Covid) Anterior Nasal Swab     Status: None   Collection Time: 03/14/22 10:55 PM   Specimen: Anterior Nasal Swab  Result Value Ref Range Status   SARS Coronavirus 2 by RT PCR NEGATIVE NEGATIVE Final    Comment: (NOTE) SARS-CoV-2 target nucleic acids are NOT DETECTED.  The SARS-CoV-2 RNA is generally detectable in upper respiratory specimens during the acute phase of infection. The lowest concentration of SARS-CoV-2 viral copies this assay can detect is 138 copies/mL. A negative result does not preclude SARS-Cov-2 infection and should not be used as the sole basis for treatment or other patient management decisions. A negative result may occur with  improper specimen collection/handling, submission of specimen other than nasopharyngeal swab, presence of viral mutation(s) within the areas targeted by this assay, and inadequate number of viral copies(<138 copies/mL). A negative result must be combined with clinical observations, patient history, and epidemiological information. The expected result is Negative.  Fact Sheet for Patients:  EntrepreneurPulse.com.au  Fact Sheet for Healthcare Providers:  IncredibleEmployment.be  This test is no t yet approved or cleared by the [place] FDA and  has been authorized for detection and/or diagnosis of SARS-CoV-2 by FDA under an Emergency Use Authorization (EUA). This EUA will remain  in effect (meaning this test can be used) for the duration of the COVID-19 declaration under Section 564(b)(1) of the Act, 21 U.S.C.section 360bbb-3(b)(1), unless the authorization is terminated  or revoked sooner.        Influenza A by PCR NEGATIVE NEGATIVE Final   Influenza B by PCR NEGATIVE NEGATIVE Final    Comment: (NOTE) The Xpert Xpress SARS-CoV-2/FLU/RSV plus assay is intended as an aid in the diagnosis of influenza from Nasopharyngeal swab specimens and should not be used as a sole basis for treatment. Nasal washings and aspirates are unacceptable for Xpert Xpress SARS-CoV-2/FLU/RSV testing.  Fact Sheet for  Patients: EntrepreneurPulse.com.au  Fact Sheet for Healthcare Providers: IncredibleEmployment.be  This test is not yet approved or cleared by the [place] FDA and has been authorized for detection and/or diagnosis of SARS-CoV-2 by FDA under an Emergency Use Authorization (EUA). This EUA will remain in effect (meaning this test can be used) for the duration of the COVID-19 declaration under Section 564(b)(1) of the Act, 21 U.S.C. section 360bbb-3(b)(1), unless the authorization is terminated or revoked.     Resp Syncytial Virus by PCR NEGATIVE NEGATIVE Final    Comment: (NOTE) Fact Sheet for Patients: EntrepreneurPulse.com.au  Fact Sheet for Healthcare Providers: IncredibleEmployment.be  This test is not yet approved or cleared by the [place] FDA and has been authorized for detection and/or diagnosis of SARS-CoV-2 by FDA under an Emergency Use Authorization (EUA). This EUA will remain in effect (meaning this test can be used) for the duration of the COVID-19 declaration under Section 564(b)(1) of the Act, 21 U.S.C. section 360bbb-3(b)(1), unless the authorization is terminated or revoked.  Performed at [facility], [place] [address], [place], [place] [address]   Culture, blood (routine x 2)     Status: None (Preliminary result)   Collection Time: 03/15/22  9:37 AM   Specimen: BLOOD  Result Value Ref Range Status   Specimen Description   Final    BLOOD BLOOD RIGHT  HAND Performed at [facility] [address], [place], [place] [address]    Special Requests   Final    AEROBIC BOTTLE ONLY Blood Culture adequate volume Performed at [facility] [address], [place], [place] [address]    Culture   Final    NO GROWTH 4 DAYS Performed at [facility], [place] [address], [place], [place] [address]    Report Status PENDING  Incomplete    Labs: CBC: Recent Labs  Lab 03/14/22 2220 03/15/22 0610 03/16/22 1015 03/17/22 0843  WBC 5.0 5.5 4.2 4.5  NEUTROABS 3.1  --  2.4 2.6  HGB 11.8* 11.3* 11.9* 11.2*  HCT 37.1 35.4* 36.9 34.9*  MCV 94.9 95.4 95.8 94.3  PLT 283 281 280 [id number]   Basic Metabolic Panel: Recent Labs  Lab 03/14/22 2220 03/15/22 0610 03/16/22 1015 03/17/22 0843  NA 137 131* 137 134*  K 3.9 3.9 4.0 4.2  CL 96* 97* 98 98  CO2 32 '29 30 30  '$ GLUCOSE 131* 117* 112* 110*  BUN '12 11 11 17  '$ CREATININE 0.74 0.50 0.72 0.71  CALCIUM 8.7* 8.1* 8.7* 8.4*  MG  --   --  2.1 2.2  PHOS  --   --  4.5  --    Liver Function Tests: Recent Labs  Lab 03/14/22 2220 03/16/22 1015  AST 18  --   ALT 14  --   ALKPHOS 81  --   BILITOT 0.5  --   PROT 6.6  --   ALBUMIN 3.4* 3.2*   CBG: No results for input(s): "GLUCAP" in the last 168 hours.  Discharge time spent: approximately 35 minutes spent on discharge counseling, evaluation of patient on day of discharge, and coordination of discharge planning with nursing, social work, pharmacy and case management  Signed: Edwin Dada, MD [facility] 03/19/2022

## 2022-03-19 NOTE — Progress Notes (Signed)
Attempted to call report x3 to [facility], no one was available to take report at the time. Nurses station number left on discharge packet.

## 2022-03-20 LAB — CULTURE, BLOOD (ROUTINE X 2)
Culture: NO GROWTH
Culture: NO GROWTH
Special Requests: ADEQUATE
Special Requests: ADEQUATE

## 2022-03-23 DIAGNOSIS — C50919 Malignant neoplasm of unspecified site of unspecified female breast: Secondary | ICD-10-CM | POA: Diagnosis not present

## 2022-03-23 DIAGNOSIS — G2582 Stiff-man syndrome: Secondary | ICD-10-CM | POA: Diagnosis not present

## 2022-03-23 DIAGNOSIS — E785 Hyperlipidemia, unspecified: Secondary | ICD-10-CM | POA: Diagnosis not present

## 2022-03-23 DIAGNOSIS — E039 Hypothyroidism, unspecified: Secondary | ICD-10-CM | POA: Diagnosis not present

## 2022-03-26 DIAGNOSIS — R531 Weakness: Secondary | ICD-10-CM | POA: Diagnosis not present

## 2022-03-26 DIAGNOSIS — N390 Urinary tract infection, site not specified: Secondary | ICD-10-CM | POA: Diagnosis not present

## 2022-03-26 DIAGNOSIS — F331 Major depressive disorder, recurrent, moderate: Secondary | ICD-10-CM | POA: Diagnosis not present

## 2022-03-26 DIAGNOSIS — G2582 Stiff-man syndrome: Secondary | ICD-10-CM | POA: Diagnosis not present

## 2022-03-26 DIAGNOSIS — F411 Generalized anxiety disorder: Secondary | ICD-10-CM | POA: Diagnosis not present

## 2022-03-26 DIAGNOSIS — R339 Retention of urine, unspecified: Secondary | ICD-10-CM | POA: Diagnosis not present

## 2022-03-26 DIAGNOSIS — I10 Essential (primary) hypertension: Secondary | ICD-10-CM | POA: Diagnosis not present

## 2022-04-01 ENCOUNTER — Other Ambulatory Visit (HOSPITAL_COMMUNITY): Payer: Self-pay | Admitting: Urology

## 2022-04-01 DIAGNOSIS — R339 Retention of urine, unspecified: Secondary | ICD-10-CM

## 2022-04-08 ENCOUNTER — Telehealth: Payer: Self-pay

## 2022-04-08 NOTE — Telephone Encounter (Signed)
Called and spoke with Rowene and she said that SPS was the diagnosis on both. She said the daughters are asking them to give the valium every 6 hours. Not as needed every 6 hours. She said they will ask pt. If she would like it after the 6 hours and she tells them yes or no.

## 2022-04-08 NOTE — Telephone Encounter (Signed)
[facility] with no answer. Left message to call office back.

## 2022-04-09 ENCOUNTER — Telehealth: Payer: Self-pay

## 2022-04-09 DIAGNOSIS — F411 Generalized anxiety disorder: Secondary | ICD-10-CM | POA: Diagnosis not present

## 2022-04-09 DIAGNOSIS — F331 Major depressive disorder, recurrent, moderate: Secondary | ICD-10-CM | POA: Diagnosis not present

## 2022-04-09 NOTE — Telephone Encounter (Signed)
Called [facility] see other messsage.

## 2022-04-12 ENCOUNTER — Emergency Department (HOSPITAL_COMMUNITY): Payer: Medicare PPO

## 2022-04-12 ENCOUNTER — Other Ambulatory Visit: Payer: Self-pay

## 2022-04-12 ENCOUNTER — Encounter (HOSPITAL_COMMUNITY): Payer: Self-pay

## 2022-04-12 ENCOUNTER — Emergency Department (HOSPITAL_COMMUNITY)
Admission: EM | Admit: 2022-04-12 | Discharge: 2022-04-12 | Disposition: A | Discharge status: Home / Self Care | Payer: Medicare PPO | Attending: Emergency Medicine | Admitting: Emergency Medicine

## 2022-04-12 DIAGNOSIS — Z853 Personal history of malignant neoplasm of breast: Secondary | ICD-10-CM | POA: Insufficient documentation

## 2022-04-12 DIAGNOSIS — S0990XA Unspecified injury of head, initial encounter: Secondary | ICD-10-CM | POA: Diagnosis not present

## 2022-04-12 DIAGNOSIS — M79603 Pain in arm, unspecified: Secondary | ICD-10-CM | POA: Diagnosis not present

## 2022-04-12 DIAGNOSIS — I4891 Unspecified atrial fibrillation: Secondary | ICD-10-CM | POA: Diagnosis not present

## 2022-04-12 DIAGNOSIS — M1712 Unilateral primary osteoarthritis, left knee: Secondary | ICD-10-CM | POA: Diagnosis not present

## 2022-04-12 DIAGNOSIS — Z91040 Latex allergy status: Secondary | ICD-10-CM | POA: Diagnosis not present

## 2022-04-12 DIAGNOSIS — Z7401 Bed confinement status: Secondary | ICD-10-CM | POA: Diagnosis not present

## 2022-04-12 DIAGNOSIS — W19XXXA Unspecified fall, initial encounter: Secondary | ICD-10-CM | POA: Diagnosis not present

## 2022-04-12 DIAGNOSIS — Y92129 Unspecified place in nursing home as the place of occurrence of the external cause: Secondary | ICD-10-CM | POA: Diagnosis not present

## 2022-04-12 DIAGNOSIS — Z79899 Other long term (current) drug therapy: Secondary | ICD-10-CM | POA: Insufficient documentation

## 2022-04-12 DIAGNOSIS — M25561 Pain in right knee: Secondary | ICD-10-CM | POA: Diagnosis not present

## 2022-04-12 DIAGNOSIS — I110 Hypertensive heart disease with heart failure: Secondary | ICD-10-CM | POA: Diagnosis not present

## 2022-04-12 DIAGNOSIS — I509 Heart failure, unspecified: Secondary | ICD-10-CM | POA: Insufficient documentation

## 2022-04-12 DIAGNOSIS — Z7901 Long term (current) use of anticoagulants: Secondary | ICD-10-CM | POA: Diagnosis not present

## 2022-04-12 DIAGNOSIS — R930 Abnormal findings on diagnostic imaging of skull and head, not elsewhere classified: Secondary | ICD-10-CM | POA: Insufficient documentation

## 2022-04-12 DIAGNOSIS — M25462 Effusion, left knee: Secondary | ICD-10-CM | POA: Diagnosis not present

## 2022-04-12 DIAGNOSIS — M25461 Effusion, right knee: Secondary | ICD-10-CM | POA: Diagnosis not present

## 2022-04-12 DIAGNOSIS — M25519 Pain in unspecified shoulder: Secondary | ICD-10-CM | POA: Diagnosis not present

## 2022-04-12 DIAGNOSIS — M1711 Unilateral primary osteoarthritis, right knee: Secondary | ICD-10-CM | POA: Diagnosis not present

## 2022-04-12 DIAGNOSIS — M25562 Pain in left knee: Secondary | ICD-10-CM | POA: Insufficient documentation

## 2022-04-12 DIAGNOSIS — M25512 Pain in left shoulder: Secondary | ICD-10-CM | POA: Diagnosis not present

## 2022-04-12 DIAGNOSIS — M25511 Pain in right shoulder: Secondary | ICD-10-CM | POA: Diagnosis not present

## 2022-04-12 DIAGNOSIS — S199XXA Unspecified injury of neck, initial encounter: Secondary | ICD-10-CM | POA: Diagnosis not present

## 2022-04-12 DIAGNOSIS — Z043 Encounter for examination and observation following other accident: Secondary | ICD-10-CM | POA: Diagnosis not present

## 2022-04-12 DIAGNOSIS — R531 Weakness: Secondary | ICD-10-CM | POA: Diagnosis not present

## 2022-04-12 MED ORDER — DIAZEPAM 5 MG PO TABS
5.0000 mg | ORAL_TABLET | Freq: Once | ORAL | Status: AC
  Administered 2022-04-12: 5 mg via ORAL
  Filled 2022-04-12: qty 1

## 2022-04-12 NOTE — Discharge Instructions (Signed)
Make sure your caregivers use appropriate lift assist devices when transferring you.

## 2022-04-12 NOTE — ED Provider Notes (Signed)
[facility] Provider Note   CSN: [id number] Arrival date & time: 04/12/22  0013     History  Chief Complaint  Patient presents with   Lytle Michaels    Carmen Haney is a 89 y.o. female.  The history is provided by the patient and a relative.  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  She states her left leg bent under her.  She is complaining of being sore all over, denies direct head injury.   Home Medications Prior to Admission medications   Medication Sig Start Date End Date Taking? Authorizing Provider  acetaminophen (TYLENOL) 500 MG tablet Take 1,000 mg by mouth in the morning and at bedtime.    [provider]  apixaban (ELIQUIS) 5 MG TABS tablet Take 1 tablet (5 mg total) by mouth 2 (two) times daily. 02/16/21   Duke, Tami Lin, PA  Baclofen 5 MG TABS Take 1 tablet (5 mg total) by mouth 3 (three) times daily. 03/19/22   Danford, Suann Larry, MD  brexpiprazole (REXULTI) 0.25 MG TABS tablet Take 1 tablet (0.25 mg total) by mouth daily. Patient taking differently: Take by mouth daily. 1/2 tablet a day 02/05/22   Fargo, Amy E, NP  Calcium-Phosphorus-Vitamin D (CALCIUM/D3 ADULT GUMMIES PO) Take 500 mg by mouth in the morning.    [provider]  diazepam (VALIUM) 5 MG tablet Take 1 tablet (5 mg total) by mouth every 6 (six) hours as needed. 03/19/22   Danford, Suann Larry, MD  docusate sodium (COLACE) 100 MG capsule Take 1 capsule (100 mg total) by mouth 2 (two) times daily. Patient taking differently: Take 100 mg by mouth daily. 12/01/21   Lavina Hamman, MD  DULoxetine 40 MG CPEP Take 1 capsule (40 mg total) by mouth at bedtime. 03/19/22   Danford, Suann Larry, MD  furosemide (LASIX) 20 MG tablet Take 20 mg by  mouth daily.    [provider]  levothyroxine (SYNTHROID) 150 MCG tablet Take 1 tablet (150 mcg total) by mouth daily before breakfast. 03/13/22   Fargo, Amy E, NP  metoprolol tartrate (LOPRESSOR) 25 MG tablet Take 0.5 tablets (12.5 mg total) by mouth 2 (two) times daily. 02/16/21   Duke, Tami Lin, PA  polyethylene glycol (MIRALAX / GLYCOLAX) 17 g packet Take 17 g by mouth daily.    [provider]  Probiotic Product (PROBIOTIC GUMMIES PO) Take 2 tablets by mouth in the morning.    [provider]  olmesartan (BENICAR) 20 MG tablet Take 20 mg by mouth daily.    04/25/10  [provider]      Allergies    Ace inhibitors and Latex    Review of Systems   Review of Systems  All other systems reviewed and are negative.   Physical Exam Updated Vital Signs BP (!) 142/62   Pulse 65   Temp 97.6 F (36.4 C) (Oral)   Resp 17   Ht 5\' 3"  (1.6 m)   Wt 90.7 kg   SpO2 93%   BMI 35.43 kg/m  Physical Exam Vitals and nursing note reviewed.   89 year old female, resting comfortably and in no acute distress. Vital signs are significant for borderline elevated blood pressure. Oxygen saturation is 93%, which is normal. Head is normocephalic and atraumatic. PERRLA,  EOMI. Oropharynx is clear. Neck is nontende. Back is nontender and there is no CVA tenderness. Lungs are clear without rales, wheezes, or rhonchi. Chest is nontender. Heart has regular rate and rhythm without murmur. Abdomen is soft, flat, nontender. Extremities: There is 2+ pretibial and pedal edema with moderate venous stasis changes and erythema.  There is no deformities of any joint.  There is pain on passive range of motion of all joints, but there is some mild to moderate tenderness to palpation over the shoulders and knees.  I was not able to identify any point tenderness. Skin is warm and dry without rash. Neurologic: Awake and alert.  ED Results / Procedures / Treatments   Labs (all labs  ordered are listed, but only abnormal results are displayed) Labs Reviewed - No data to display  EKG None  Radiology DG Hip Unilat W or Wo Pelvis 2-3 Views Right  Result Date: 04/12/2022 CLINICAL DATA:  Fall. EXAM: DG HIP (WITH PELVIS) 3V RIGHT COMPARISON:  None Available. FINDINGS: There is no evidence of hip fracture or dislocation. There is no evidence of arthropathy or other focal bone abnormality. Generalized osteopenia with diminished bony detail. IMPRESSION: Negative. Electronically Signed   By: Jorje Guild M.D.   On: 04/12/2022 05:45   DG Hip Unilat W or Wo Pelvis 2-3 Views Left  Result Date: 04/12/2022 CLINICAL DATA:  Fall. EXAM: DG HIP  2V LEFT COMPARISON:  None Available. FINDINGS: There is no evidence of hip fracture or dislocation. There is no evidence of arthropathy or other focal bone abnormality. IMPRESSION: Negative. Electronically Signed   By: Jorje Guild M.D.   On: 04/12/2022 05:44   CT Head Wo Contrast  Result Date: 04/12/2022 CLINICAL DATA:  Minor head trauma EXAM: CT HEAD WITHOUT CONTRAST CT CERVICAL SPINE WITHOUT CONTRAST TECHNIQUE: Multidetector CT imaging of the head and cervical spine was performed following the standard protocol without intravenous contrast. Multiplanar CT image reconstructions of the cervical spine were also generated. RADIATION DOSE REDUCTION: This exam was performed according to the departmental dose-optimization program which includes automated exposure control, adjustment of the mA and/or kV according to patient size and/or use of iterative reconstruction technique. COMPARISON:  11/26/2021 FINDINGS: CT HEAD FINDINGS Brain: No evidence of acute infarction, hemorrhage, hydrocephalus, extra-axial collection or mass lesion/mass effect. Chronic small vessel ischemia in the cerebral white matter that is extensive. Vascular: No hyperdense vessel or unexpected calcification. Skull: Normal. Negative for fracture or focal lesion. Sinuses/Orbits: No  evidence of injury. Chronic left sphenoid sinusitis with partial opacification and sclerosis. CT CERVICAL SPINE FINDINGS Alignment: Normal. Skull base and vertebrae: No acute fracture. No primary bone lesion or focal pathologic process. Soft tissues and spinal canal: No prevertebral fluid or swelling. No visible canal hematoma. Disc levels: Ordinary, generalized degenerative changes. Upper chest: No evidence of injury IMPRESSION: No evidence of acute intracranial or cervical spine injury. Electronically Signed   By: Jorje Guild M.D.   On: 04/12/2022 05:17   CT Cervical Spine Wo Contrast  Result Date: 04/12/2022 CLINICAL DATA:  Minor head trauma EXAM: CT HEAD WITHOUT CONTRAST CT CERVICAL SPINE WITHOUT CONTRAST TECHNIQUE: Multidetector CT imaging of the head and cervical spine was performed following the standard protocol without intravenous contrast. Multiplanar CT image reconstructions of the cervical spine were also generated. RADIATION DOSE REDUCTION: This exam was performed according to the departmental dose-optimization program which includes automated exposure control, adjustment of the mA and/or kV according to patient size and/or use of iterative reconstruction technique. COMPARISON:  11/26/2021 FINDINGS: CT HEAD FINDINGS Brain: No evidence of acute infarction, hemorrhage, hydrocephalus, extra-axial collection or mass lesion/mass effect. Chronic small vessel ischemia in the cerebral white matter that is extensive. Vascular: No hyperdense vessel or unexpected calcification. Skull: Normal. Negative for fracture or focal lesion. Sinuses/Orbits: No evidence of injury. Chronic left sphenoid sinusitis with partial opacification and sclerosis. CT CERVICAL SPINE FINDINGS Alignment: Normal. Skull base and vertebrae: No acute fracture. No primary bone lesion or focal pathologic process. Soft tissues and spinal canal: No prevertebral fluid or swelling. No visible canal hematoma. Disc levels: Ordinary,  generalized degenerative changes. Upper chest: No evidence of injury IMPRESSION: No evidence of acute intracranial or cervical spine injury. Electronically Signed   By: Jorje Guild M.D.   On: 04/12/2022 05:17   DG Knee 2 Views Left  Result Date: 04/12/2022 CLINICAL DATA:  Fall with pain. EXAM: LEFT KNEE - 1-2 VIEW; RIGHT KNEE - 1-2 VIEW COMPARISON:  11/26/2021. FINDINGS: Right knee: No acute fracture or dislocation. There is an old healed fracture of the proximal fibula. Moderate tricompartmental degenerative changes are present. There is a trace suprapatellar joint effusion. Enthesopathic changes are present at the patella. Left knee: No acute fracture or dislocation. There are moderate tricompartmental degenerative changes, most pronounced in the patellofemoral and lateral compartments. No joint effusion. The soft tissues are within normal limits. IMPRESSION: 1. No acute fracture or dislocation bilaterally. 2. Small suprapatellar joint effusion on the right. Electronically Signed   By: Brett Fairy M.D.   On: 04/12/2022 03:36   DG Knee 2 Views Right  Result Date: 04/12/2022 CLINICAL DATA:  Fall with pain. EXAM: LEFT KNEE - 1-2 VIEW; RIGHT KNEE - 1-2 VIEW COMPARISON:  11/26/2021. FINDINGS: Right knee: No acute fracture or dislocation. There is an old healed fracture of the proximal fibula. Moderate tricompartmental degenerative changes are present. There is a trace suprapatellar joint effusion. Enthesopathic changes are present at the patella. Left knee: No acute fracture or dislocation. There are moderate tricompartmental degenerative changes, most pronounced in the patellofemoral and lateral compartments. No joint effusion. The soft tissues are within normal limits. IMPRESSION: 1. No acute fracture or dislocation bilaterally. 2. Small suprapatellar joint effusion on the right. Electronically Signed   By: Brett Fairy M.D.   On: 04/12/2022 03:36   DG Shoulder Left Port  Result Date:  04/12/2022 CLINICAL DATA:  Fall, left shoulder pain EXAM: LEFT SHOULDER COMPARISON:  None Available. FINDINGS: Normal alignment. No acute fracture or dislocation. Acromioclavicular joint space is preserved. Glenohumeral joint space is not well profiled. Limited evaluation of the left hemithorax is unremarkable. IMPRESSION: 1. No acute fracture or dislocation. Electronically Signed   By: Fidela Salisbury M.D.   On: 04/12/2022 03:35   DG Shoulder Right Port  Result Date: 04/12/2022 CLINICAL DATA:  Fall, shoulder pain EXAM: RIGHT SHOULDER - 1 VIEW COMPARISON:  None Available. FINDINGS: Normal alignment. No acute fracture or dislocation. Acromioclavicular joint space is preserved. Glenohumeral joint space is not well profiled. Limited evaluation of the right hemithorax is unremarkable. IMPRESSION: 1. No acute fracture or dislocation. Electronically Signed   By: Fidela Salisbury M.D.   On: 04/12/2022 03:30    Procedures Procedures    Medications Ordered in ED Medications  diazepam (VALIUM) tablet 5 mg (has no administration in time range)    ED Course/ Medical Decision Making/ A&P  Medical Decision Making Amount and/or Complexity of Data Reviewed Radiology: ordered.  Risk Prescription drug management.   Fall and patient who is anticoagulated.  There does appear to be any significant orthopedic injury from the fall.  X-rays of both shoulders and both knees showed no fracture.  I have independently viewed the images, and agree with radiologist interpretation.  However, with her mechanism of fall and her leg going under, I am ordering x-rays of both hips as well.  Because she is anticoagulated, I have ordered CT of head and cervical spine.  Hip x-rays, CT of head, CT of cervical spine are all negative for acute injury.  I have independently viewed the images, and agree with radiologist's interpretation.  I am discharging her back to her skilled nursing facility.  They will  need to make sure to use appropriate lift devices when transferring the patient.  Final Clinical Impression(s) / ED Diagnoses Final diagnoses:  Fall at nursing home, initial encounter  Chronic anticoagulation    Rx / DC Orders ED Discharge Orders     None         Delora Fuel, MD [id number] [phone]

## 2022-04-12 NOTE — ED Triage Notes (Signed)
Pt BIB [facility] from [facility] Pt states that she has "stiff person syndrome" and that her "legs are like cement". Pt reports that she fell about 3 hours PTA while facility staff were trying to transfer her from wheelchair to bed. Pt complains of BIL shoulder pain and R arm pain. Pt takes eliquis. Did not hit head. No LOC.

## 2022-04-13 ENCOUNTER — Other Ambulatory Visit: Payer: Self-pay

## 2022-04-13 ENCOUNTER — Encounter (HOSPITAL_COMMUNITY): Payer: Self-pay

## 2022-04-13 ENCOUNTER — Emergency Department (HOSPITAL_COMMUNITY)
Admission: EM | Admit: 2022-04-13 | Discharge: 2022-04-13 | Disposition: A | Discharge status: Home / Self Care | Payer: Medicare PPO | Attending: Emergency Medicine | Admitting: Emergency Medicine

## 2022-04-13 DIAGNOSIS — I10 Essential (primary) hypertension: Secondary | ICD-10-CM | POA: Insufficient documentation

## 2022-04-13 DIAGNOSIS — G2582 Stiff-man syndrome: Secondary | ICD-10-CM

## 2022-04-13 DIAGNOSIS — W19XXXA Unspecified fall, initial encounter: Secondary | ICD-10-CM | POA: Insufficient documentation

## 2022-04-13 DIAGNOSIS — I2510 Atherosclerotic heart disease of native coronary artery without angina pectoris: Secondary | ICD-10-CM | POA: Insufficient documentation

## 2022-04-13 DIAGNOSIS — N390 Urinary tract infection, site not specified: Secondary | ICD-10-CM | POA: Diagnosis not present

## 2022-04-13 DIAGNOSIS — T83511A Infection and inflammatory reaction due to indwelling urethral catheter, initial encounter: Secondary | ICD-10-CM | POA: Insufficient documentation

## 2022-04-13 DIAGNOSIS — M79606 Pain in leg, unspecified: Secondary | ICD-10-CM | POA: Diagnosis not present

## 2022-04-13 DIAGNOSIS — Z853 Personal history of malignant neoplasm of breast: Secondary | ICD-10-CM | POA: Insufficient documentation

## 2022-04-13 DIAGNOSIS — S8990XA Unspecified injury of unspecified lower leg, initial encounter: Secondary | ICD-10-CM | POA: Insufficient documentation

## 2022-04-13 DIAGNOSIS — Z87891 Personal history of nicotine dependence: Secondary | ICD-10-CM | POA: Diagnosis not present

## 2022-04-13 DIAGNOSIS — Z7401 Bed confinement status: Secondary | ICD-10-CM | POA: Diagnosis not present

## 2022-04-13 DIAGNOSIS — R0902 Hypoxemia: Secondary | ICD-10-CM | POA: Diagnosis not present

## 2022-04-13 DIAGNOSIS — R531 Weakness: Secondary | ICD-10-CM | POA: Diagnosis not present

## 2022-04-13 LAB — URINALYSIS, ROUTINE W REFLEX MICROSCOPIC
Bilirubin Urine: NEGATIVE
Glucose, UA: NEGATIVE mg/dL
Ketones, ur: NEGATIVE mg/dL
Nitrite: NEGATIVE
Protein, ur: 30 mg/dL — AB
Specific Gravity, Urine: 1.013 (ref 1.005–1.030)
Trans Epithel, UA: 1
WBC, UA: >50 WBC/hpf (ref 0–5)
pH: 5 (ref 5.0–8.0)

## 2022-04-13 MED ORDER — DIAZEPAM 5 MG PO TABS
5.0000 mg | ORAL_TABLET | Freq: Once | ORAL | Status: AC
  Administered 2022-04-13: 5 mg via ORAL
  Filled 2022-04-13: qty 1

## 2022-04-13 MED ORDER — DIAZEPAM 5 MG PO TABS: 5.0000 mg | ORAL_TABLET | Freq: Four times a day (QID) | ORAL | 0 refills | Status: DC

## 2022-04-13 MED ORDER — CEFPODOXIME PROXETIL 200 MG PO TABS: 200.0000 mg | ORAL_TABLET | Freq: Two times a day (BID) | ORAL | 0 refills | Status: DC

## 2022-04-13 MED ORDER — CEFDINIR 300 MG PO CAPS
300.0000 mg | ORAL_CAPSULE | Freq: Once | ORAL | Status: AC
  Administered 2022-04-13: 300 mg via ORAL
  Filled 2022-04-13: qty 1

## 2022-04-13 MED ORDER — CEFPODOXIME PROXETIL 200 MG PO TABS: 200.0000 mg | ORAL_TABLET | Freq: Two times a day (BID) | ORAL | 0 refills | Status: AC

## 2022-04-13 MED ORDER — TRAMADOL HCL 50 MG PO TABS
50.0000 mg | ORAL_TABLET | Freq: Once | ORAL | Status: AC
  Administered 2022-04-13: 50 mg via ORAL
  Filled 2022-04-13: qty 1

## 2022-04-13 NOTE — ED Triage Notes (Signed)
BIBA from [facility] for foley catheter pain X 3 weeks and bilateral leg pain which is a chronic issue

## 2022-04-13 NOTE — ED Provider Notes (Signed)
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  Her daughters are the chief historians.  She was seen yesterday after a fall and had multiple CT scans and x-rays which were negative for fracture.  She returns with increasing pain in her legs, possibly due to the fall but also exacerbated by the fact that she is supposed to be on Valium 5 mg every 6 hours but her orders are written for it as needed for anxiety when in fact it is to be scheduled for her muscle spasms.  She also has an indwelling Foley for urinary retention which has been in place for nearly a month.  She was scheduled to have it changed a week from today but she has been having discomfort at the Foley catheter site for about a week.   Past Medical History:  Diagnosis Date   ACE-inhibitor cough    Breast CA 2010   s/p lumpectomy & radiation   CAD (coronary artery disease) 07/1998   s/p cath in 2000. L main with 30 to 40%   Chronic diastolic heart failure    Hearing loss    HTN (hypertension)    Hyperlipidemia    Obesities, morbid    Osteoporosis    Paroxysmal atrial fibrillation    Personal history of radiation therapy 2010   Right Breast Cancer   Thyroid disease    Vertebral fracture, pathological     Past Surgical History:  Procedure Laterality Date   BREAST BIOPSY Left    BREAST LUMPECTOMY Right March 2010   BREAST LUMPECTOMY WITH RADIOACTIVE SEED LOCALIZATION Left 09/20/2014   Procedure: LEFT BREAST LUMPECTOMY WITH RADIOACTIVE SEED LOCALIZATION;  Surgeon: Autumn Messing III, MD;  Location: [facility];  Service: General;  Laterality: Left;   CARPAL TUNNEL RELEASE Right    CESAREAN SECTION     EYE SURGERY     Right Eye   Skin Cancer Removed from Right  Forearm     TOTAL ABDOMINAL HYSTERECTOMY      Family History  Problem Relation Age of Onset   Coronary artery disease Mother    Hypertension Mother    Melanoma Mother    Cancer Mother 65       breast   Breast cancer Mother    Cancer Father 59       prostate   Cancer Sister 62       breast   Breast cancer Sister    Cancer Maternal Aunt 11       breast   Cancer Sister 26       breast   Breast cancer Sister    Breast cancer Daughter 43    Social History   Tobacco Use   Smoking status: Former    Types: Cigarettes    Quit date: 01/13/1963    Years since quitting: 59.2   Smokeless tobacco: Never   Tobacco comments:    stopped in 1970's  Vaping Use   Vaping Use: Never used  Substance Use Topics   Alcohol use: No    Alcohol/week: 0.0 standard drinks of alcohol   Drug use: Never    Prior to Admission medications   Medication Sig Start Date End Date Taking? Authorizing Provider  acetaminophen (TYLENOL)  500 MG tablet Take 1,000 mg by mouth in the morning and at bedtime.    [provider]  apixaban (ELIQUIS) 5 MG TABS tablet Take 1 tablet (5 mg total) by mouth 2 (two) times daily. 02/16/21   Duke, Tami Lin, PA  Baclofen 5 MG TABS Take 1 tablet (5 mg total) by mouth 3 (three) times daily. 03/19/22   Danford, Suann Larry, MD  brexpiprazole (REXULTI) 0.25 MG TABS tablet Take 1 tablet (0.25 mg total) by mouth daily. Patient taking differently: Take by mouth daily. 1/2 tablet a day 02/05/22   Fargo, Amy E, NP  Calcium-Phosphorus-Vitamin D (CALCIUM/D3 ADULT GUMMIES PO) Take 500 mg by mouth in the morning.    [provider]  cefpodoxime (VANTIN) 200 MG tablet Take 1 tablet (200 mg total) by mouth 2 (two) times daily for 7 days. 04/13/22 04/20/22 Yes Regino Fournet, MD  diazepam (VALIUM) 5 MG tablet Take 1 tablet (5 mg total) by mouth every 6 (six) hours. This medication is to be administered on a regular basis, not on an as-needed basis. 04/13/22  Yes Eber Ferrufino, MD   docusate sodium (COLACE) 100 MG capsule Take 1 capsule (100 mg total) by mouth 2 (two) times daily. Patient taking differently: Take 100 mg by mouth daily. 12/01/21   Lavina Hamman, MD  DULoxetine 40 MG CPEP Take 1 capsule (40 mg total) by mouth at bedtime. 03/19/22   Danford, Suann Larry, MD  furosemide (LASIX) 20 MG tablet Take 20 mg by mouth daily.    [provider]  levothyroxine (SYNTHROID) 150 MCG tablet Take 1 tablet (150 mcg total) by mouth daily before breakfast. 03/13/22   Fargo, Amy E, NP  metoprolol tartrate (LOPRESSOR) 25 MG tablet Take 0.5 tablets (12.5 mg total) by mouth 2 (two) times daily. 02/16/21   Duke, Tami Lin, PA  polyethylene glycol (MIRALAX / GLYCOLAX) 17 g packet Take 17 g by mouth daily.    [provider]  Probiotic Product (PROBIOTIC GUMMIES PO) Take 2 tablets by mouth in the morning.    [provider]  olmesartan (BENICAR) 20 MG tablet Take 20 mg by mouth daily.    04/25/10  [provider]    Allergies Ace inhibitors and Latex   REVIEW OF SYSTEMS  Negative except as noted here or in the History of Present Illness.   PHYSICAL EXAMINATION  Initial Vital Signs Blood pressure 119/71, pulse 60, temperature 98 F (36.7 C), temperature source Oral, resp. rate 18, height 5\' 3"  (1.6 m), weight 90.7 kg, SpO2 92 %.  Examination General: Well-developed, well-nourished female in no acute distress; appearance consistent with age of record HENT: normocephalic; atraumatic Eyes: Normal appearance Neck: supple Heart: regular rate and rhythm Lungs: clear to auscultation bilaterally Abdomen: soft; distended; abdominal wall tenderness; bowel sounds present Extremities: Contractures, muscle tenderness and edema of lower extremities Neurologic: Awake, alert; motor function intact in all extremities and symmetric; no facial droop Skin: Warm and dry Psychiatric: Normal mood and affect   RESULTS  Summary of this visit's results,  reviewed and interpreted by myself:   EKG Interpretation  Date/Time:    Ventricular Rate:    PR Interval:    QRS Duration:   QT Interval:    QTC Calculation:   R Axis:     Text Interpretation:         Laboratory Studies: Results for orders placed or performed during the hospital encounter of 04/13/22 (from the past 24 hour(s))  Urinalysis, Routine w  reflex microscopic -Urine, Catheterized     Status: Abnormal   Collection Time: 04/13/22  3:18 AM  Result Value Ref Range   Color, Urine YELLOW YELLOW   APPearance CLOUDY (A) CLEAR   Specific Gravity, Urine 1.013 1.005 - 1.030   pH 5.0 5.0 - 8.0   Glucose, UA NEGATIVE NEGATIVE mg/dL   Hgb urine dipstick MODERATE (A) NEGATIVE   Bilirubin Urine NEGATIVE NEGATIVE   Ketones, ur NEGATIVE NEGATIVE mg/dL   Protein, ur 30 (A) NEGATIVE mg/dL   Nitrite NEGATIVE NEGATIVE   Leukocytes,Ua LARGE (A) NEGATIVE   RBC / HPF 21-50 0 - 5 RBC/hpf   WBC, UA >50 0 - 5 WBC/hpf   Bacteria, UA RARE (A) NONE SEEN   Squamous Epithelial / HPF 6-10 0 - 5 /HPF   Trans Epithel, UA 1    WBC Clumps PRESENT    Mucus PRESENT    Imaging Studies: DG Hip Unilat W or Wo Pelvis 2-3 Views Right  Result Date: 04/12/2022 CLINICAL DATA:  Fall. EXAM: DG HIP (WITH PELVIS) 3V RIGHT COMPARISON:  None Available. FINDINGS: There is no evidence of hip fracture or dislocation. There is no evidence of arthropathy or other focal bone abnormality. Generalized osteopenia with diminished bony detail. IMPRESSION: Negative. Electronically Signed   By: Jorje Guild M.D.   On: 04/12/2022 05:45   DG Hip Unilat W or Wo Pelvis 2-3 Views Left  Result Date: 04/12/2022 CLINICAL DATA:  Fall. EXAM: DG HIP  2V LEFT COMPARISON:  None Available. FINDINGS: There is no evidence of hip fracture or dislocation. There is no evidence of arthropathy or other focal bone abnormality. IMPRESSION: Negative. Electronically Signed   By: Jorje Guild M.D.   On: 04/12/2022 05:44   CT Head Wo  Contrast  Result Date: 04/12/2022 CLINICAL DATA:  Minor head trauma EXAM: CT HEAD WITHOUT CONTRAST CT CERVICAL SPINE WITHOUT CONTRAST TECHNIQUE: Multidetector CT imaging of the head and cervical spine was performed following the standard protocol without intravenous contrast. Multiplanar CT image reconstructions of the cervical spine were also generated. RADIATION DOSE REDUCTION: This exam was performed according to the departmental dose-optimization program which includes automated exposure control, adjustment of the mA and/or kV according to patient size and/or use of iterative reconstruction technique. COMPARISON:  11/26/2021 FINDINGS: CT HEAD FINDINGS Brain: No evidence of acute infarction, hemorrhage, hydrocephalus, extra-axial collection or mass lesion/mass effect. Chronic small vessel ischemia in the cerebral white matter that is extensive. Vascular: No hyperdense vessel or unexpected calcification. Skull: Normal. Negative for fracture or focal lesion. Sinuses/Orbits: No evidence of injury. Chronic left sphenoid sinusitis with partial opacification and sclerosis. CT CERVICAL SPINE FINDINGS Alignment: Normal. Skull base and vertebrae: No acute fracture. No primary bone lesion or focal pathologic process. Soft tissues and spinal canal: No prevertebral fluid or swelling. No visible canal hematoma. Disc levels: Ordinary, generalized degenerative changes. Upper chest: No evidence of injury IMPRESSION: No evidence of acute intracranial or cervical spine injury. Electronically Signed   By: Jorje Guild M.D.   On: 04/12/2022 05:17   CT Cervical Spine Wo Contrast  Result Date: 04/12/2022 CLINICAL DATA:  Minor head trauma EXAM: CT HEAD WITHOUT CONTRAST CT CERVICAL SPINE WITHOUT CONTRAST TECHNIQUE: Multidetector CT imaging of the head and cervical spine was performed following the standard protocol without intravenous contrast. Multiplanar CT image reconstructions of the cervical spine were also generated.  RADIATION DOSE REDUCTION: This exam was performed according to the departmental dose-optimization program which includes automated exposure control, adjustment of the  mA and/or kV according to patient size and/or use of iterative reconstruction technique. COMPARISON:  11/26/2021 FINDINGS: CT HEAD FINDINGS Brain: No evidence of acute infarction, hemorrhage, hydrocephalus, extra-axial collection or mass lesion/mass effect. Chronic small vessel ischemia in the cerebral white matter that is extensive. Vascular: No hyperdense vessel or unexpected calcification. Skull: Normal. Negative for fracture or focal lesion. Sinuses/Orbits: No evidence of injury. Chronic left sphenoid sinusitis with partial opacification and sclerosis. CT CERVICAL SPINE FINDINGS Alignment: Normal. Skull base and vertebrae: No acute fracture. No primary bone lesion or focal pathologic process. Soft tissues and spinal canal: No prevertebral fluid or swelling. No visible canal hematoma. Disc levels: Ordinary, generalized degenerative changes. Upper chest: No evidence of injury IMPRESSION: No evidence of acute intracranial or cervical spine injury. Electronically Signed   By: Jorje Guild M.D.   On: 04/12/2022 05:17   DG Knee 2 Views Left  Result Date: 04/12/2022 CLINICAL DATA:  Fall with pain. EXAM: LEFT KNEE - 1-2 VIEW; RIGHT KNEE - 1-2 VIEW COMPARISON:  11/26/2021. FINDINGS: Right knee: No acute fracture or dislocation. There is an old healed fracture of the proximal fibula. Moderate tricompartmental degenerative changes are present. There is a trace suprapatellar joint effusion. Enthesopathic changes are present at the patella. Left knee: No acute fracture or dislocation. There are moderate tricompartmental degenerative changes, most pronounced in the patellofemoral and lateral compartments. No joint effusion. The soft tissues are within normal limits. IMPRESSION: 1. No acute fracture or dislocation bilaterally. 2. Small suprapatellar joint  effusion on the right. Electronically Signed   By: Brett Fairy M.D.   On: 04/12/2022 03:36   DG Knee 2 Views Right  Result Date: 04/12/2022 CLINICAL DATA:  Fall with pain. EXAM: LEFT KNEE - 1-2 VIEW; RIGHT KNEE - 1-2 VIEW COMPARISON:  11/26/2021. FINDINGS: Right knee: No acute fracture or dislocation. There is an old healed fracture of the proximal fibula. Moderate tricompartmental degenerative changes are present. There is a trace suprapatellar joint effusion. Enthesopathic changes are present at the patella. Left knee: No acute fracture or dislocation. There are moderate tricompartmental degenerative changes, most pronounced in the patellofemoral and lateral compartments. No joint effusion. The soft tissues are within normal limits. IMPRESSION: 1. No acute fracture or dislocation bilaterally. 2. Small suprapatellar joint effusion on the right. Electronically Signed   By: Brett Fairy M.D.   On: 04/12/2022 03:36   DG Shoulder Left Port  Result Date: 04/12/2022 CLINICAL DATA:  Fall, left shoulder pain EXAM: LEFT SHOULDER COMPARISON:  None Available. FINDINGS: Normal alignment. No acute fracture or dislocation. Acromioclavicular joint space is preserved. Glenohumeral joint space is not well profiled. Limited evaluation of the left hemithorax is unremarkable. IMPRESSION: 1. No acute fracture or dislocation. Electronically Signed   By: Fidela Salisbury M.D.   On: 04/12/2022 03:35   DG Shoulder Right Port  Result Date: 04/12/2022 CLINICAL DATA:  Fall, shoulder pain EXAM: RIGHT SHOULDER - 1 VIEW COMPARISON:  None Available. FINDINGS: Normal alignment. No acute fracture or dislocation. Acromioclavicular joint space is preserved. Glenohumeral joint space is not well profiled. Limited evaluation of the right hemithorax is unremarkable. IMPRESSION: 1. No acute fracture or dislocation. Electronically Signed   By: Fidela Salisbury M.D.   On: 04/12/2022 03:30    ED COURSE and MDM  Nursing notes, initial and  subsequent vitals signs, including pulse oximetry, reviewed and interpreted by myself.  Vitals:   04/13/22 0128 04/13/22 0129  BP:  119/71  Pulse:  60  Resp:  18  Temp:  98 F (36.7 C)  TempSrc:  Oral  SpO2:  92%  Weight: 90.7 kg   Height: 5\' 3"  (1.6 m)    Medications  cefdinir (OMNICEF) capsule 300 mg (has no administration in time range)  diazepam (VALIUM) tablet 5 mg (5 mg Oral Given 04/13/22 0212)  traMADol (ULTRAM) tablet 50 mg (50 mg Oral Given 04/13/22 0212)   2:10 AM Foley catheter replaced by nursing staff.  A smaller Foley was used and the patient's comfort improved with the new catheter.   3:23 AM Patient feeling better after oral Valium and tramadol.  Awaiting urinalysis.  3:33 AM Urinalysis consistent with urinary tract infection which may have contributed to the patient's Foley discomfort.  We will treat for a Foley related UTI.  Urine culture pending.   PROCEDURES  Procedures   ED DIAGNOSES     ICD-10-CM   1. Urinary tract infection associated with indwelling urethral catheter, initial encounter  T83.511A    N39.0     2. Stiff person syndrome  G25.82          Shanon Rosser, MD 04/13/22 [phone]

## 2022-04-14 ENCOUNTER — Encounter: Payer: Self-pay | Admitting: Orthopedic Surgery

## 2022-04-14 LAB — URINE CULTURE: Culture: 10000 — AB

## 2022-04-14 NOTE — Telephone Encounter (Signed)
Amy please advise if I should add you back as PCP based on patient and/or family members last response

## 2022-04-15 ENCOUNTER — Ambulatory Visit (INDEPENDENT_AMBULATORY_CARE_PROVIDER_SITE_OTHER): Payer: Medicare PPO | Admitting: Podiatry

## 2022-04-15 ENCOUNTER — Telehealth (HOSPITAL_BASED_OUTPATIENT_CLINIC_OR_DEPARTMENT_OTHER): Payer: Self-pay | Admitting: Emergency Medicine

## 2022-04-15 DIAGNOSIS — G2582 Stiff-man syndrome: Secondary | ICD-10-CM | POA: Diagnosis not present

## 2022-04-15 DIAGNOSIS — Z91199 Patient's noncompliance with other medical treatment and regimen due to unspecified reason: Secondary | ICD-10-CM

## 2022-04-15 DIAGNOSIS — R29898 Other symptoms and signs involving the musculoskeletal system: Secondary | ICD-10-CM | POA: Diagnosis not present

## 2022-04-15 DIAGNOSIS — I4891 Unspecified atrial fibrillation: Secondary | ICD-10-CM | POA: Diagnosis not present

## 2022-04-15 DIAGNOSIS — R760 Raised antibody titer: Secondary | ICD-10-CM | POA: Diagnosis not present

## 2022-04-15 NOTE — Progress Notes (Deleted)
NEUROLOGY FOLLOW UP OFFICE NOTE  Carmen Haney [id number]  Subjective:  Carmen Haney is a 89 y.o. year old female with a history of HTN, HLD, pAfib (on eliquis), CAD, hypothyroidism, osteoporosis, hearing loss, breast cancer (2010), OA, compression fractures of the spine (T12 and L3), depression, and anxiety who we last saw on 03/06/22.  To briefly review: Patient's symptoms started some time back. She has had difficulty with balance and gait for about 5 years. About 1 year ago she was walking with a walker, but having difficulty getting to the bathroom quickly enough. She started having trouble getting up from bed and chair. Patient had a fall in 11/2021. She was having weakness of lower extremities about 3 weeks prior that seemed new. She got up and reached for the walker, her legs gave way and she fell. She had to go to the hospital. She had a fracture of right fibula, but still hold weight. She went to rehab. She was doing well. Prior to being discharged, she was starting to get weaker. She started having mild spasms. She was also fearful of walking.   Her spasms continued to worse and became very severe. The spasms included the trunk and legs. [name]) called patient's geriatrician on 02/10/22 stating that patient could not get up and having severe spasms on 02/09/22 and 911 was called. She is weak in the legs and cannot walk. Daughter was recommended to take patient to ED. Patient was admitted to [facility] from 02/10/22 to 02/14/22. While hospitalized inflammatory markers were elevated and GAD abs were elevated to 160, raising concern for stiff person syndrome. Patient was started on diazepam 5 mg TID and given prednisone 40 mg for 4 days (for muscle strain). Per DC summary, pain and spasm had improved. She was discharged with home health PT/OT.    She had to have diazepam increased to 5 mg every 6-7 hours (QID). If she only takes it TID, she is in pain due to breakthrough. When she gets nervous  or anxious, the spasms worsen. She also takes Tylenol twice daily as well.   It is so hard to move her body, that it is hard to get up in the morning or get ready for bed. She has difficulty rolling over at night. She is immobile when sleeping.   She has had severe OA and knee replacement was recommended, but patient declined.  Most recent Assessment and Plan (03/06/22): Her neurological examination is pertinent for bilateral lower extremity weakness (in setting of significant peripheral edema into thighs). Available diagnostic data is significant for GAD ab positive to 160.3. MRI lumbar spine showed compression fractures at T12 and L3 which are chronic. 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 She is currently getting relief with valium 3-4 times daily. She is still not very mobile, but is working with home therapy. Much of today's appointment was discussing Stiff Person's Syndrome and family expectations/dynamics.   PLAN: -Continue Valium 5 mg 3-4 times daily -Could consider baclofen, but in the setting of ?weakness of lower extremities, this could make patient weaker -Continue home PT/OT -Information and prognosis of SPS was discussed  Since their last visit: Patient presented to the ED on 03/14/22 and was admitted until 03/19/22 for weakness. She was found to have a UTI and given cephalexin.  Her valium was continued and baclofen was added.  Daughters had messaged concerned that when patient was moved into assisted living, the wrong  diagnosis was on her valium. We spoke with [facility] who confirmed the correct diagnosis. Daughters were concerned the valium was PRN, but I explained that scheduling the medication could be dangerous.  Patient then had a fall on 04/12/22 during transition from wheelchair to bed. Patient was taken to ED where no fractures were identified. Patient returned to ED on 04/13/22 for pain and abdominal discomfort at  her Foley site. She was treated for UTI. She returned to the ED on 04/18/22 for urethral/suprapubic pain. Foley was not draining well. Patient improved after draining the bladder and adjusting the catheter.  ***  MEDICATIONS:  Outpatient Encounter Medications as of 04/22/2022  Medication Sig   acetaminophen (TYLENOL) 500 MG tablet Take 1,000 mg by mouth in the morning and at bedtime.   apixaban (ELIQUIS) 5 MG TABS tablet Take 1 tablet (5 mg total) by mouth 2 (two) times daily.   Baclofen 5 MG TABS Take 1 tablet (5 mg total) by mouth 3 (three) times daily.   brexpiprazole (REXULTI) 0.25 MG TABS tablet Take 1 tablet (0.25 mg total) by mouth daily. (Patient taking differently: Take by mouth daily. 1/2 tablet a day)   Calcium-Phosphorus-Vitamin D (CALCIUM/D3 ADULT GUMMIES PO) Take 500 mg by mouth in the morning.   cefpodoxime (VANTIN) 200 MG tablet Take 1 tablet (200 mg total) by mouth 2 (two) times daily for 7 days.   diazepam (VALIUM) 5 MG tablet Take 1 tablet (5 mg total) by mouth every 6 (six) hours. This medication is to be administered on a regular basis, not on an as-needed basis.   docusate sodium (COLACE) 100 MG capsule Take 1 capsule (100 mg total) by mouth 2 (two) times daily. (Patient taking differently: Take 100 mg by mouth daily.)   DULoxetine 40 MG CPEP Take 1 capsule (40 mg total) by mouth at bedtime.   furosemide (LASIX) 20 MG tablet Take 20 mg by mouth daily.   levothyroxine (SYNTHROID) 150 MCG tablet Take 1 tablet (150 mcg total) by mouth daily before breakfast.   metoprolol tartrate (LOPRESSOR) 25 MG tablet Take 0.5 tablets (12.5 mg total) by mouth 2 (two) times daily.   polyethylene glycol (MIRALAX / GLYCOLAX) 17 g packet Take 17 g by mouth daily.   Probiotic Product (PROBIOTIC GUMMIES PO) Take 2 tablets by mouth in the morning.   [DISCONTINUED] olmesartan (BENICAR) 20 MG tablet Take 20 mg by mouth daily.     No facility-administered encounter medications on file as of 04/22/2022.     PAST MEDICAL HISTORY: Past Medical History:  Diagnosis Date   ACE-inhibitor cough    Breast CA 2010   s/p lumpectomy & radiation   CAD (coronary artery disease) 07/1998   s/p cath in 2000. L main with 30 to 40%   Chronic diastolic heart failure    Hearing loss    HTN (hypertension)    Hyperlipidemia    Obesities, morbid    Osteoporosis    Paroxysmal atrial fibrillation    Personal history of radiation therapy 2010   Right Breast Cancer   Thyroid disease    Vertebral fracture, pathological     PAST SURGICAL HISTORY: Past Surgical History:  Procedure Laterality Date   BREAST BIOPSY Left    BREAST LUMPECTOMY Right March 2010   BREAST LUMPECTOMY WITH RADIOACTIVE SEED LOCALIZATION Left 09/20/2014   Procedure: LEFT BREAST LUMPECTOMY WITH RADIOACTIVE SEED LOCALIZATION;  Surgeon: [name], MD;  Location: [facility];  Service: General;  Laterality: Left;   CARPAL  TUNNEL RELEASE Right    CESAREAN SECTION     EYE SURGERY     Right Eye   Skin Cancer Removed from Right Forearm     TOTAL ABDOMINAL HYSTERECTOMY      ALLERGIES: Allergies  Allergen Reactions   Ace Inhibitors Cough   Latex Rash    FAMILY HISTORY: Family History  Problem Relation Age of Onset   Coronary artery disease Mother    Hypertension Mother    Melanoma Mother    Cancer Mother 69       breast   Breast cancer Mother    Cancer Father 42       prostate   Cancer Sister 77       breast   Breast cancer Sister    Cancer Maternal Aunt 16       breast   Cancer Sister 46       breast   Breast cancer Sister    Breast cancer Daughter 68    SOCIAL HISTORY: Social History   Tobacco Use   Smoking status: Former    Types: Cigarettes    Quit date: 01/13/1963    Years since quitting: 59.2   Smokeless tobacco: Never   Tobacco comments:    stopped in 1970's  Vaping Use   Vaping Use: Never used  Substance Use Topics   Alcohol use: No    Alcohol/week: 0.0 standard drinks of alcohol    Drug use: Never   Social History   Social History Narrative   Are you right handed or left handed? right   Are you currently employed ?    What is your current occupation?   Do you live at home alone?   Who lives with you? Daughter and husband   What type of home do you live in: 1 story or 2 story? two    Caffeine 2 cups a day      Objective:  Vital Signs:  There were no vitals taken for this visit.  ***  Labs and Imaging review: New results: 03/17/22: CBC unremarkable BMP: unremarkable Mg: wnl  CT head and cervical spine wo contrast (04/12/22): FINDINGS: CT HEAD FINDINGS   Brain: No evidence of acute infarction, hemorrhage, hydrocephalus, extra-axial collection or mass lesion/mass effect. Chronic small vessel ischemia in the cerebral white matter that is extensive.   Vascular: No hyperdense vessel or unexpected calcification.   Skull: Normal. Negative for fracture or focal lesion.   Sinuses/Orbits: No evidence of injury. Chronic left sphenoid sinusitis with partial opacification and sclerosis.   CT CERVICAL SPINE FINDINGS   Alignment: Normal.   Skull base and vertebrae: No acute fracture. No primary bone lesion or focal pathologic process.   Soft tissues and spinal canal: No prevertebral fluid or swelling. No visible canal hematoma.   Disc levels: Ordinary, generalized degenerative changes.   Upper chest: No evidence of injury   IMPRESSION: No evidence of acute intracranial or cervical spine injury.  Previously reviewed results: 11/2021: Normal or unremarkable: CBC, BMP, ESR, vit D, TSH CMP significant for albumin of 3.1   02/11/22: CRP: 16.3 ESR: 58 GAD ab: 160.3 CK: 132 CMP: hyponatremia (Na 128 - chronic), albumin 2.9 CBC unremarkable   Imaging: CT head wo contrast (02/10/22): FINDINGS: Brain: No intracranial hemorrhage, mass effect, or midline shift. Brain volume is normal for age. No hydrocephalus. The basilar cisterns are patent.  Minimal basal gangliar mineralization is likely senescent. Mild periventricular and deep chronic small vessel ischemic change. No evidence of  territorial infarct or acute ischemia. No extra-axial or intracranial fluid collection.   Vascular: No hyperdense vessel or unexpected calcification.   Skull: No fracture or focal lesion.   Sinuses/Orbits: Chronic sphenoid sinusitis with cortical thickening and fluid. No acute findings. No mastoid effusion. Bilateral cataract resection.   Other: None.   IMPRESSION: 1. No acute intracranial abnormality. 2. Mild chronic small vessel ischemic change. 3. Chronic sphenoid sinusitis.   MRI lumbar spine wo contrast (02/11/22): FINDINGS: Segmentation:  Standard.   Alignment: Hyperlordosis with mild L2-3 retrolisthesis and L5-S1, L4-5 anterolisthesis   Vertebrae: Remote compression fractures at T12 and L3. Minimal superior endplate edema at L3 is likely reactive. Height loss is greater at T12 and measures up to 50% anteriorly when compared to L1. No acute or subacute fracture. No aggressive bone lesion.   Conus medullaris and cauda equina: Conus extends to the L1-2 level. Conus and cauda equina appear normal.   Paraspinal and other soft tissues: Negative for perispinal mass or inflammation. Expansion and T2 hyperintensity in the lower right iliopsoas   Disc levels:   T12- L1: Disc space narrowing and ventral spurring. No neural impingement   L1-L2: Disc space narrowing and bulging. Negative facets. No neural impingement   L2-L3: Disc narrowing and bulging. Bilateral inferior foraminal protrusion. Negative facets. Mild spinal stenosis   L3-L4: Disc space narrowing with inferior foraminal bulging. Mild facet spurring. Mild spinal stenosis   L4-L5: Degenerative facet spurring. Circumferential disc bulging. Mild spinal stenosis   L5-S1:Degenerative facet spurring with mild anterolisthesis. Mild circumferential disc bulging. No neural  compression.   IMPRESSION: 1. Strain and/or hemorrhage partially covered at the right iliopsoas, consider CT of the pelvis. 2. No acute lumbar fracture. Remote compression fractures at T12 and L3. 3. Generalized lumbar spine degeneration as described.   CT abdomen/pelvis w/ contrast (02/11/22): FINDINGS: Lower chest: No acute abnormality.   Hepatobiliary: No cholelithiasis or biliary dilatation is noted. Multiple hepatic cysts are noted.   Pancreas: Unremarkable. No pancreatic ductal dilatation or surrounding inflammatory changes.   Spleen: Mild splenomegaly.   Adrenals/Urinary Tract: Adrenal glands appear normal. Nonobstructive right renal calculus is noted. Bilateral renal cysts are noted. No hydronephrosis or renal obstruction is noted. Urinary bladder is unremarkable.   Stomach/Bowel: Stomach is within normal limits. Appendix appears normal. No evidence of bowel wall thickening, distention, or inflammatory changes.   Vascular/Lymphatic: No significant vascular findings are present. No enlarged abdominal or pelvic lymph nodes.   Reproductive: Status post hysterectomy. No adnexal masses.   Other: No abdominal wall hernia or abnormality. No abdominopelvic ascites.   Musculoskeletal: Old T12 and L3 compression fractures are noted. No acute osseous abnormality is noted. The right iliopsoas muscle is diffusely enlarged with ill-defined margins suggesting inflammation, edema or injury. No definite evidence of hemorrhage is noted.   IMPRESSION: Right iliopsoas muscle is diffusely enlarged with ill-defined margins suggesting inflammation or edema or possibly injury. No definite hematoma is noted.   Nonobstructive right renal calculus. No hydronephrosis or renal obstruction.   CT lumbar spine wo contrast (11/26/21): FINDINGS: Segmentation: There are five lumbar type vertebral bodies. The last full intervertebral disc space is labeled L5-S1.   Alignment: Normal  alignment of the lumbar vertebral bodies. The facets are also normally aligned.   Vertebrae: Diffuse and fairly marked osteoporosis. Mild superior endplate depression of L3 is stable since the prior radiographs. No acute lumbar spine compression fracture. There is a compression deformity of T12. I do not see this for certain on the prior  lumbar spine plain films. This is likely an acute compression fracture. No retropulsion or canal compromise.   Paraspinal and other soft tissues: No significant paraspinal findings. I do not see an obvious paraspinal hematoma at T12.   Disc levels:   T12-L1: No significant findings.   L1-2: Diffuse annular bulge and mild facet disease contributing to mild bilateral lateral recess stenosis.   L2-3: Moderate facet disease and bulging annulus with mild spinal and bilateral lateral recess stenosis.   L3-4: Diffuse bulging annulus, facet disease and ligamentum flavum thickening contributing to mild to moderate spinal and bilateral lateral recess stenosis.   L4-5: Diffuse bulging degenerated annulus, facet disease and ligamentum flavum thickening contributing to moderately severe spinal and bilateral lateral recess stenosis. No significant foraminal stenosis.   L5-S1: Bulging degenerated annulus and severe facet disease with mild spinal and bilateral lateral recess stenosis. No foraminal stenosis.   IMPRESSION: 1. Acute compression fracture of T12 without retropulsion or canal compromise. 2. Diffuse and fairly marked osteoporosis. 3. Multilevel spinal and lateral recess stenosis as discussed above.   CT head and cervical spine wo contrast (11/26/21): FINDINGS: CT HEAD FINDINGS   Brain: No evidence of acute infarction, hemorrhage, hydrocephalus, extra-axial collection or mass lesion/mass effect.   Vascular: Calcific atherosclerosis.   Skull: No acute fracture.   Sinuses/Orbits: Clear sinuses.  No acute orbital findings.   Other: No  mastoid effusions.   CT CERVICAL SPINE FINDINGS   Alignment: No substantial sagittal subluxation.   Skull base and vertebrae: No evidence of acute fracture.   Soft tissues and spinal canal: No prevertebral fluid or swelling. No visible canal hematoma.   Disc levels:  Moderate multilevel degenerative change.   Upper chest: Visualized lung apices are clear.   IMPRESSION: 1. No evidence of acute intracranial abnormality. 2. No evidence of acute fracture or traumatic malalignment in the cervical spine.  Assessment/Plan:  This is Carmen Haney, a 89 y.o. female with GAD ab positive stiff person syndrome. She has significant leg pain. *** ***   Plan: *** -Continue valium 5 mg q6h PRN -Continue baclofen 5 mg TID  Return to clinic in ***  Total time spent reviewing records, interview, history/exam, documentation, and coordination of care on day of encounter:  *** min  Jacquelyne Balint, MD

## 2022-04-15 NOTE — Progress Notes (Signed)
1. No-show for appointment   Two recent visits to ED. No charge.

## 2022-04-15 NOTE — Telephone Encounter (Signed)
Post ED Visit - Positive Culture Follow-up  Culture report reviewed by antimicrobial stewardship pharmacist: [place] Team []  Elenor Quinones, Pharm.D. []  Heide Guile, Pharm.D., BCPS AQ-ID []  Parks Neptune, Pharm.D., BCPS []  Alycia Rossetti, Pharm.D., BCPS []  [place], Pharm.D., BCPS, AAHIVP []  Legrand Como, Pharm.D., BCPS, AAHIVP []  Salome Arnt, PharmD, BCPS []  Johnnette Gourd, PharmD, BCPS []  Hughes Better, PharmD, BCPS []  Leeroy Cha, PharmD []  Laqueta Linden, PharmD, BCPS []  Albertina Parr, PharmD  [place] Team []  Leodis Sias, PharmD []  Lindell Spar, PharmD []  Royetta Asal, PharmD []  Graylin Shiver, Rph []  [name]) Glennon Mac, PharmD []  Arlyn Dunning, PharmD []  Netta Cedars, PharmD []  Dia Sitter, PharmD []  Leone Haven, PharmD []  Gretta Arab, PharmD []  Theodis Shove, PharmD []  Peggyann Juba, PharmD []  Reuel Boom, PharmD   Positive urine culture Treated with cefpodoxime, organism sensitive to the same and no further patient follow-up is required at this time.  Hazle Nordmann 04/15/2022, 9:52 AM

## 2022-04-16 DIAGNOSIS — F331 Major depressive disorder, recurrent, moderate: Secondary | ICD-10-CM | POA: Diagnosis not present

## 2022-04-16 DIAGNOSIS — F411 Generalized anxiety disorder: Secondary | ICD-10-CM | POA: Diagnosis not present

## 2022-04-17 ENCOUNTER — Encounter: Payer: Self-pay | Admitting: Orthopedic Surgery

## 2022-04-18 ENCOUNTER — Emergency Department (HOSPITAL_COMMUNITY)
Admission: EM | Admit: 2022-04-18 | Discharge: 2022-04-18 | Disposition: A | Discharge status: Skilled Nursing Facility | Payer: Medicare PPO | Attending: Emergency Medicine | Admitting: Emergency Medicine

## 2022-04-18 ENCOUNTER — Other Ambulatory Visit: Payer: Self-pay

## 2022-04-18 ENCOUNTER — Emergency Department (HOSPITAL_COMMUNITY): Payer: Medicare PPO

## 2022-04-18 DIAGNOSIS — T83091A Other mechanical complication of indwelling urethral catheter, initial encounter: Secondary | ICD-10-CM | POA: Diagnosis not present

## 2022-04-18 DIAGNOSIS — Z7901 Long term (current) use of anticoagulants: Secondary | ICD-10-CM | POA: Insufficient documentation

## 2022-04-18 DIAGNOSIS — R3982 Chronic bladder pain: Secondary | ICD-10-CM | POA: Diagnosis not present

## 2022-04-18 DIAGNOSIS — I10 Essential (primary) hypertension: Secondary | ICD-10-CM | POA: Diagnosis not present

## 2022-04-18 DIAGNOSIS — R339 Retention of urine, unspecified: Secondary | ICD-10-CM | POA: Insufficient documentation

## 2022-04-18 DIAGNOSIS — T859XXA Unspecified complication of internal prosthetic device, implant and graft, initial encounter: Secondary | ICD-10-CM

## 2022-04-18 DIAGNOSIS — R1030 Lower abdominal pain, unspecified: Secondary | ICD-10-CM | POA: Diagnosis not present

## 2022-04-18 DIAGNOSIS — R300 Dysuria: Secondary | ICD-10-CM | POA: Diagnosis not present

## 2022-04-18 DIAGNOSIS — T83098A Other mechanical complication of other indwelling urethral catheter, initial encounter: Secondary | ICD-10-CM | POA: Diagnosis not present

## 2022-04-18 DIAGNOSIS — R079 Chest pain, unspecified: Secondary | ICD-10-CM | POA: Diagnosis not present

## 2022-04-18 DIAGNOSIS — E669 Obesity, unspecified: Secondary | ICD-10-CM | POA: Diagnosis not present

## 2022-04-18 DIAGNOSIS — Z91040 Latex allergy status: Secondary | ICD-10-CM | POA: Insufficient documentation

## 2022-04-18 DIAGNOSIS — Z7401 Bed confinement status: Secondary | ICD-10-CM | POA: Diagnosis not present

## 2022-04-18 LAB — COMPREHENSIVE METABOLIC PANEL
ALT: 11 U/L (ref 0–44)
AST: 16 U/L (ref 15–41)
Albumin: 3.8 g/dL (ref 3.5–5.0)
Alkaline Phosphatase: 75 U/L (ref 38–126)
Anion gap: 10 (ref 5–15)
BUN: 13 mg/dL (ref 8–23)
CO2: 29 mmol/L (ref 22–32)
Calcium: 9 mg/dL (ref 8.9–10.3)
Chloride: 94 mmol/L — ABNORMAL LOW (ref 98–111)
Creatinine, Ser: 0.71 mg/dL (ref 0.44–1.00)
GFR, Estimated: >60 mL/min (ref >60)
Glucose, Bld: 120 mg/dL — ABNORMAL HIGH (ref 70–99)
Potassium: 4.1 mmol/L (ref 3.5–5.1)
Sodium: 133 mmol/L — ABNORMAL LOW (ref 135–145)
Total Bilirubin: 0.7 mg/dL (ref 0.3–1.2)
Total Protein: 7.2 g/dL (ref 6.5–8.1)

## 2022-04-18 LAB — CBC WITH DIFFERENTIAL/PLATELET
Abs Immature Granulocytes: 0.04 10*3/uL (ref 0.00–0.07)
Basophils Absolute: 0 10*3/uL (ref 0.0–0.1)
Basophils Relative: 0 %
Eosinophils Absolute: 0.1 10*3/uL (ref 0.0–0.5)
Eosinophils Relative: 1 %
HCT: 43.1 % (ref 36.0–46.0)
Hemoglobin: 14.1 g/dL (ref 12.0–15.0)
Immature Granulocytes: 0 %
Lymphocytes Relative: 9 %
Lymphs Abs: 0.9 10*3/uL (ref 0.7–4.0)
MCH: 30.5 pg (ref 26.0–34.0)
MCHC: 32.7 g/dL (ref 30.0–36.0)
MCV: 93.1 fL (ref 80.0–100.0)
Monocytes Absolute: 0.6 10*3/uL (ref 0.1–1.0)
Monocytes Relative: 7 %
Neutro Abs: 8.2 10*3/uL — ABNORMAL HIGH (ref 1.7–7.7)
Neutrophils Relative %: 83 %
Platelets: 346 10*3/uL (ref 150–400)
RBC: 4.63 MIL/uL (ref 3.87–5.11)
RDW: 13.6 % (ref 11.5–15.5)
WBC: 9.9 10*3/uL (ref 4.0–10.5)
nRBC: 0 % (ref 0.0–0.2)

## 2022-04-18 LAB — APTT: aPTT: 33 seconds (ref 24–36)

## 2022-04-18 LAB — URINALYSIS, ROUTINE W REFLEX MICROSCOPIC
Bilirubin Urine: NEGATIVE
Glucose, UA: NEGATIVE mg/dL
Ketones, ur: NEGATIVE mg/dL
Nitrite: NEGATIVE
Protein, ur: NEGATIVE mg/dL
Specific Gravity, Urine: 1.01 (ref 1.005–1.030)
pH: 5 (ref 5.0–8.0)

## 2022-04-18 LAB — PROTIME-INR
INR: 1.2 (ref 0.8–1.2)
Prothrombin Time: 15.1 seconds (ref 11.4–15.2)

## 2022-04-18 LAB — LACTIC ACID, PLASMA: Lactic Acid, Venous: 1.5 mmol/L (ref 0.5–1.9)

## 2022-04-18 MED ORDER — SODIUM CHLORIDE 0.9 % IV BOLUS
1000.0000 mL | Freq: Once | INTRAVENOUS | Status: AC
  Administered 2022-04-18: 1000 mL via INTRAVENOUS

## 2022-04-18 MED ORDER — DIAZEPAM 5 MG PO TABS: 5.0000 mg | ORAL_TABLET | Freq: Four times a day (QID) | ORAL | 0 refills | Status: DC

## 2022-04-18 NOTE — ED Notes (Addendum)
Bladder scan showed >300 mL. Pt catheter flushed w/20cc sterile saline. Seems to be draining well, will reassess. 350 mL drained as of 1040

## 2022-04-18 NOTE — Discharge Instructions (Signed)
Contact a health care provider if:  Your urine:  Looks cloudy.  Has a bad smell.  Stops flowing into the drainage bag.  Your catheter:  Gets clogged.  Starts to leak.  You feel pain or pressure in the bladder area.  You have back pain.  Your drainage bag or tubing looks dirty.  Get help right away if:  You have a fever or chills.  You have severe pain in your back or your lower abdomen.  You have warmth, redness, swelling, or pain in the urethra area.  You notice blood in your urine.  Your catheter gets pulled out.

## 2022-04-18 NOTE — ED Notes (Signed)
700 mL total in foley bag

## 2022-04-18 NOTE — ED Provider Notes (Signed)
[facility] EMERGENCY DEPARTMENT AT [facility] Provider Note   CSN: [id number] Arrival date & time: 04/18/22  1004     History  Chief Complaint  Patient presents with   foley displacement   urine retention    Carmen Haney is a 89 y.o. female in by EMS.  History given by EMS at bedside as well as patient.  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  She recently had a urinary tract infection and has an indwelling Foley catheter.  Patient is complaining of urethral pain, suprapubic pain and her facility states that she has not been draining fluid out of the catheter but around it into a dependent  HPI     Home Medications Prior to Admission medications   Medication Sig Start Date End Date Taking? Authorizing Provider  acetaminophen (TYLENOL) 500 MG tablet Take 1,000 mg by mouth in the morning and at bedtime.    [provider]  apixaban (ELIQUIS) 5 MG TABS tablet Take 1 tablet (5 mg total) by mouth 2 (two) times daily. 02/16/21   Duke, Roe Rutherford, PA  Baclofen 5 MG TABS Take 1 tablet (5 mg total) by mouth 3 (three) times daily. 03/19/22   Danford, Earl Lites, MD  brexpiprazole (REXULTI) 0.25 MG TABS tablet Take 1 tablet (0.25 mg total) by mouth daily. Patient taking differently: Take by mouth daily. 1/2 tablet a day 02/05/22   Fargo, Amy E, NP  Calcium-Phosphorus-Vitamin D (CALCIUM/D3 ADULT GUMMIES PO) Take 500 mg by mouth in the morning.    [provider]  cefpodoxime (VANTIN) 200 MG tablet Take 1 tablet (200 mg total) by mouth 2 (two) times daily for 7 days. 04/13/22 04/20/22  Molpus, Jonny Ruiz, MD  diazepam (VALIUM) 5 MG tablet Take 1 tablet (5 mg total) by mouth every 6 (six) hours. This medication is to be administered on a regular basis, not on an as-needed basis. 04/18/22   Arthor Captain, PA-C  docusate sodium (COLACE) 100 MG capsule Take 1 capsule (100 mg total) by mouth 2 (two) times daily. Patient taking differently: Take 100 mg by mouth  daily. 12/01/21   [name], MD  DULoxetine 40 MG CPEP Take 1 capsule (40 mg total) by mouth at bedtime. 03/19/22   Danford, Earl Lites, MD  furosemide (LASIX) 20 MG tablet Take 20 mg by mouth daily.    [provider]  levothyroxine (SYNTHROID) 150 MCG tablet Take 1 tablet (150 mcg total) by mouth daily before breakfast. 03/13/22   Fargo, Amy E, NP  metoprolol tartrate (LOPRESSOR) 25 MG tablet Take 0.5 tablets (12.5 mg total) by mouth 2 (two) times daily. 02/16/21   Duke, Roe Rutherford, PA  polyethylene glycol (MIRALAX / GLYCOLAX) 17 g packet Take 17 g by mouth daily.    [provider]  Probiotic Product (PROBIOTIC GUMMIES PO) Take 2 tablets by mouth in the morning.    [provider]  olmesartan (BENICAR) 20 MG tablet Take 20 mg by mouth daily.    04/25/10  [provider]      Allergies    Ace inhibitors and Latex    Review of Systems   Review of Systems  Physical Exam Updated Vital Signs BP 127/61   Pulse 75   Temp 98 F (36.7 C) (Oral)   Resp 15   SpO2 100%  Physical Exam Vitals and nursing note reviewed.  Constitutional:      General: She is not in acute distress.  Appearance: She is well-developed. She is not diaphoretic.  HENT:     Head: Normocephalic and atraumatic.     Right Ear: External ear normal.     Left Ear: External ear normal.     Nose: Nose normal.     Mouth/Throat:     Mouth: Mucous membranes are moist.  Eyes:     General: No scleral icterus.    Conjunctiva/sclera: Conjunctivae normal.  Cardiovascular:     Rate and Rhythm: Normal rate and regular rhythm.     Heart sounds: Normal heart sounds. No murmur heard.    No friction rub. No gallop.  Pulmonary:     Effort: Pulmonary effort is normal. No respiratory distress.     Breath sounds: Normal breath sounds.  Abdominal:     General: Bowel sounds are normal. There is distension.     Palpations: Abdomen is soft. There is no mass.     Tenderness: There is  abdominal tenderness in the suprapubic area. There is no guarding.     Comments: +500 ml urine on bladder scan  Musculoskeletal:     Cervical back: Normal range of motion.  Skin:    General: Skin is warm and dry.  Neurological:     Mental Status: She is alert and oriented to person, place, and time.  Psychiatric:        Behavior: Behavior normal.     ED Results / Procedures / Treatments   Labs (all labs ordered are listed, but only abnormal results are displayed) Labs Reviewed  COMPREHENSIVE METABOLIC PANEL - Abnormal; Notable for the following components:      Result Value   Sodium 133 (*)    Chloride 94 (*)    Glucose, Bld 120 (*)    All other components within normal limits  CBC WITH DIFFERENTIAL/PLATELET - Abnormal; Notable for the following components:   Neutro Abs 8.2 (*)    All other components within normal limits  URINALYSIS, ROUTINE W REFLEX MICROSCOPIC - Abnormal; Notable for the following components:   Hgb urine dipstick SMALL (*)    Leukocytes,Ua MODERATE (*)    Bacteria, UA RARE (*)    All other components within normal limits  CULTURE, BLOOD (ROUTINE X 2)  CULTURE, BLOOD (ROUTINE X 2)  URINE CULTURE  LACTIC ACID, PLASMA  PROTIME-INR  APTT    EKG None  Radiology DG Chest Port 1 View  Result Date: 04/18/2022 CLINICAL DATA:  Provided history: Questionable sepsis-evaluate for abnormality. Catheter pain. Lack of urine drainage. EXAM: PORTABLE CHEST 1 VIEW COMPARISON:  Prior chest radiographs 03/14/2022 and earlier. FINDINGS: Heart size at the upper limits of normal. Ill-defined opacity of within the medial right lung base. No appreciable airspace consolidation on the left. No evidence of pleural effusion or pneumothorax. No acute osseous abnormality identified. Degenerative changes of the spine. IMPRESSION: Ill-defined opacity within the medial right lung base, which may reflect pneumonia or atelectasis. Electronically Signed   By: Jackey LogeKyle  Golden D.O.   On:  04/18/2022 11:30    Procedures Procedures    Medications Ordered in ED Medications  sodium chloride 0.9 % bolus 1,000 mL (0 mLs Intravenous Stopped 04/18/22 1231)    ED Course/ Medical Decision Making/ A&P                             Medical Decision Making Patient here with bladder pain and mild hypotension.  She has an indwelling catheter in place.  Patient catheter was adjusted and put out more than 500 mL of clear urine without sediment or clots.  I suspect that the catheter was lodged up against her bladder wall. I ordered labs which showed baseline mild upper glycemia on CMP, no evidence of AKI.  CBC without significant abnormality.  PT/INR APTT within normal limits, urine does not appear to be infected in the setting of chronic indwelling catheter.  Patient initially hypotensive therefore concern for potential sepsis however I do not think that this is the cause I think that she was under stress and may be had some mild dehydration.  She is greatly improved after draining her bladder and adjusting the catheter.  Patient states she has no pain in her bladder at this time and her.  All questions discussed and answered at bedside with the patient's daughter.  I visualized and interpreted a plain film of the chest.  Although the radiologist has read of potential ill-defined opacity in the medial right lung base versus echo atelectasis patient is not having fever, cough, chest pain or any other signs and symptoms of systemic infection.  Daughter is in agreement.  Plan is to return the patient to her assisted living facility.  Given all of the issues she is having I suspect she may need higher level of care after discussion with the patient's daughter.  Patient appears otherwise appropriate for discharge at this time  Amount and/or Complexity of Data Reviewed Labs: ordered. Radiology: ordered. ECG/medicine tests: ordered.  Risk Prescription drug management.           Final  Clinical Impression(s) / ED Diagnoses Final diagnoses:  Complication of catheter, initial encounter    Rx / DC Orders ED Discharge Orders          Ordered    diazepam (VALIUM) 5 MG tablet  Every 6 hours        04/18/22 1237              Arthor Captain, PA-C 04/18/22 2010    Bethann Berkshire, MD 04/21/22 1557

## 2022-04-18 NOTE — ED Notes (Signed)
Attempted 2x to call report to [facility]. No answer

## 2022-04-18 NOTE — ED Notes (Signed)
[facility] called. Pt placed on transport list 

## 2022-04-18 NOTE — ED Triage Notes (Signed)
Pt BIBA from [place]. C/o catheter pain, and lack of urine drainage. Bladder scan upon arrival >354mL visualized.  Aox4  Pt uses wc to ambulate

## 2022-04-19 LAB — URINE CULTURE

## 2022-04-20 ENCOUNTER — Encounter: Payer: Medicare PPO | Admitting: Orthopedic Surgery

## 2022-04-20 ENCOUNTER — Encounter: Payer: Self-pay | Admitting: Orthopedic Surgery

## 2022-04-20 ENCOUNTER — Telehealth: Payer: Self-pay | Admitting: Orthopedic Surgery

## 2022-04-20 NOTE — Progress Notes (Signed)
   This service is provided via telemedicine  No vital signs collected/recorded due to the encounter was a telemedicine visit.   Location of patient (ex: home, work):  Home  Patient consents to a telephone visit:  Yes  Location of the provider (ex: office, home):  [facility]  Name of any referring provider:  Eloisa Northern, MD   Names of all persons participating in the telemedicine service and their role in the encounter:  Patient, Daughter [name], Meda Klinefelter, RMA, Hazle Nordmann, NP.    Time spent on call: 8 minutes spent on the phone with Medical Assistant.

## 2022-04-20 NOTE — Telephone Encounter (Signed)
Unable to complete video visit due to poor video connection. Patient recently seen in the ED due to foley catheter complications. At this time foley is working normally. She has completed rehab at [facility] and was discharged about 2 weeks ago. She is now a resident of [facility] in assisted living. She is having trouble with ADLs and bed bound at this time. Daughter is looking into skilled nursing transfer. She is waiting to be seen by PCP at [facility]. She plans to stop care with me and transfer to facility provider for convenience. They do not know when they will be seen by new PCP. Advised to reach out to me if they have any issues during this process.

## 2022-04-21 NOTE — Progress Notes (Signed)
This encounter was created in error - please disregard.

## 2022-04-22 ENCOUNTER — Other Ambulatory Visit: Payer: Self-pay | Admitting: Neurology

## 2022-04-22 ENCOUNTER — Ambulatory Visit: Payer: Medicare PPO | Admitting: Neurology

## 2022-04-22 DIAGNOSIS — M6289 Other specified disorders of muscle: Secondary | ICD-10-CM

## 2022-04-22 DIAGNOSIS — G2582 Stiff-man syndrome: Secondary | ICD-10-CM

## 2022-04-22 MED ORDER — DIAZEPAM 5 MG PO TABS
5.0000 mg | ORAL_TABLET | Freq: Four times a day (QID) | ORAL | 5 refills | Status: DC | PRN
Start: 2022-04-22 — End: 2022-04-30

## 2022-04-22 MED ORDER — BACLOFEN 5 MG PO TABS
5.0000 mg | ORAL_TABLET | Freq: Three times a day (TID) | ORAL | 5 refills | Status: DC
Start: 2022-04-22 — End: 2022-04-30

## 2022-04-23 DIAGNOSIS — R102 Pelvic and perineal pain: Secondary | ICD-10-CM | POA: Diagnosis not present

## 2022-04-23 DIAGNOSIS — R338 Other retention of urine: Secondary | ICD-10-CM | POA: Diagnosis not present

## 2022-04-23 LAB — CULTURE, BLOOD (ROUTINE X 2): Culture: NO GROWTH

## 2022-04-24 ENCOUNTER — Telehealth: Payer: Self-pay | Admitting: Anesthesiology

## 2022-04-24 ENCOUNTER — Telehealth: Payer: Self-pay

## 2022-04-24 ENCOUNTER — Other Ambulatory Visit: Payer: Self-pay

## 2022-04-24 DIAGNOSIS — R600 Localized edema: Secondary | ICD-10-CM

## 2022-04-24 DIAGNOSIS — M6289 Other specified disorders of muscle: Secondary | ICD-10-CM

## 2022-04-24 DIAGNOSIS — R29898 Other symptoms and signs involving the musculoskeletal system: Secondary | ICD-10-CM

## 2022-04-24 DIAGNOSIS — G2582 Stiff-man syndrome: Secondary | ICD-10-CM

## 2022-04-24 NOTE — Telephone Encounter (Signed)
Carmen Haney from [facility] left message with AN stating she needs orders for PT and OT.requests call back.

## 2022-04-24 NOTE — Telephone Encounter (Signed)
Order PT and OT with [facility] waiting to hear back from them.

## 2022-04-26 ENCOUNTER — Inpatient Hospital Stay (HOSPITAL_COMMUNITY)
Admission: EM | Admit: 2022-04-26 | Discharge: 2022-04-30 | DRG: 699 | Disposition: A | Discharge status: Home Health | Payer: Medicare PPO | Attending: Family Medicine | Admitting: Family Medicine

## 2022-04-26 ENCOUNTER — Emergency Department (HOSPITAL_COMMUNITY): Payer: Medicare PPO

## 2022-04-26 ENCOUNTER — Other Ambulatory Visit: Payer: Self-pay

## 2022-04-26 ENCOUNTER — Encounter (HOSPITAL_COMMUNITY): Payer: Self-pay | Admitting: Radiology

## 2022-04-26 ENCOUNTER — Encounter: Payer: Self-pay | Admitting: Internal Medicine

## 2022-04-26 DIAGNOSIS — E871 Hypo-osmolality and hyponatremia: Secondary | ICD-10-CM | POA: Diagnosis present

## 2022-04-26 DIAGNOSIS — H9190 Unspecified hearing loss, unspecified ear: Secondary | ICD-10-CM | POA: Diagnosis present

## 2022-04-26 DIAGNOSIS — Z803 Family history of malignant neoplasm of breast: Secondary | ICD-10-CM

## 2022-04-26 DIAGNOSIS — Z87891 Personal history of nicotine dependence: Secondary | ICD-10-CM

## 2022-04-26 DIAGNOSIS — Z8249 Family history of ischemic heart disease and other diseases of the circulatory system: Secondary | ICD-10-CM

## 2022-04-26 DIAGNOSIS — I5032 Chronic diastolic (congestive) heart failure: Secondary | ICD-10-CM | POA: Diagnosis present

## 2022-04-26 DIAGNOSIS — Z7901 Long term (current) use of anticoagulants: Secondary | ICD-10-CM

## 2022-04-26 DIAGNOSIS — G2582 Stiff-man syndrome: Secondary | ICD-10-CM | POA: Diagnosis present

## 2022-04-26 DIAGNOSIS — E669 Obesity, unspecified: Secondary | ICD-10-CM | POA: Diagnosis present

## 2022-04-26 DIAGNOSIS — B3731 Acute candidiasis of vulva and vagina: Secondary | ICD-10-CM | POA: Diagnosis present

## 2022-04-26 DIAGNOSIS — Z79890 Hormone replacement therapy (postmenopausal): Secondary | ICD-10-CM

## 2022-04-26 DIAGNOSIS — Z7401 Bed confinement status: Secondary | ICD-10-CM | POA: Diagnosis not present

## 2022-04-26 DIAGNOSIS — R760 Raised antibody titer: Secondary | ICD-10-CM

## 2022-04-26 DIAGNOSIS — T83511A Infection and inflammatory reaction due to indwelling urethral catheter, initial encounter: Secondary | ICD-10-CM | POA: Diagnosis present

## 2022-04-26 DIAGNOSIS — L89621 Pressure ulcer of left heel, stage 1: Secondary | ICD-10-CM | POA: Diagnosis present

## 2022-04-26 DIAGNOSIS — B9620 Unspecified Escherichia coli [E. coli] as the cause of diseases classified elsewhere: Secondary | ICD-10-CM | POA: Diagnosis present

## 2022-04-26 DIAGNOSIS — E039 Hypothyroidism, unspecified: Secondary | ICD-10-CM | POA: Diagnosis present

## 2022-04-26 DIAGNOSIS — L89311 Pressure ulcer of right buttock, stage 1: Secondary | ICD-10-CM | POA: Diagnosis present

## 2022-04-26 DIAGNOSIS — I4891 Unspecified atrial fibrillation: Secondary | ICD-10-CM | POA: Diagnosis present

## 2022-04-26 DIAGNOSIS — Z90710 Acquired absence of both cervix and uterus: Secondary | ICD-10-CM

## 2022-04-26 DIAGNOSIS — B3789 Other sites of candidiasis: Secondary | ICD-10-CM

## 2022-04-26 DIAGNOSIS — I2510 Atherosclerotic heart disease of native coronary artery without angina pectoris: Secondary | ICD-10-CM | POA: Diagnosis present

## 2022-04-26 DIAGNOSIS — R000 Tachycardia, unspecified: Secondary | ICD-10-CM | POA: Diagnosis present

## 2022-04-26 DIAGNOSIS — I130 Hypertensive heart and chronic kidney disease with heart failure and stage 1 through stage 4 chronic kidney disease, or unspecified chronic kidney disease: Secondary | ICD-10-CM | POA: Diagnosis present

## 2022-04-26 DIAGNOSIS — N3289 Other specified disorders of bladder: Secondary | ICD-10-CM | POA: Diagnosis not present

## 2022-04-26 DIAGNOSIS — M6289 Other specified disorders of muscle: Secondary | ICD-10-CM

## 2022-04-26 DIAGNOSIS — N200 Calculus of kidney: Secondary | ICD-10-CM | POA: Diagnosis present

## 2022-04-26 DIAGNOSIS — R0902 Hypoxemia: Secondary | ICD-10-CM | POA: Diagnosis not present

## 2022-04-26 DIAGNOSIS — I480 Paroxysmal atrial fibrillation: Secondary | ICD-10-CM | POA: Diagnosis present

## 2022-04-26 DIAGNOSIS — B372 Candidiasis of skin and nail: Secondary | ICD-10-CM | POA: Diagnosis present

## 2022-04-26 DIAGNOSIS — M810 Age-related osteoporosis without current pathological fracture: Secondary | ICD-10-CM | POA: Diagnosis present

## 2022-04-26 DIAGNOSIS — F411 Generalized anxiety disorder: Secondary | ICD-10-CM | POA: Diagnosis present

## 2022-04-26 DIAGNOSIS — Z808 Family history of malignant neoplasm of other organs or systems: Secondary | ICD-10-CM

## 2022-04-26 DIAGNOSIS — N183 Chronic kidney disease, stage 3 unspecified: Secondary | ICD-10-CM | POA: Diagnosis present

## 2022-04-26 DIAGNOSIS — N390 Urinary tract infection, site not specified: Secondary | ICD-10-CM | POA: Diagnosis present

## 2022-04-26 DIAGNOSIS — Z91040 Latex allergy status: Secondary | ICD-10-CM

## 2022-04-26 DIAGNOSIS — Y846 Urinary catheterization as the cause of abnormal reaction of the patient, or of later complication, without mention of misadventure at the time of the procedure: Secondary | ICD-10-CM | POA: Diagnosis present

## 2022-04-26 DIAGNOSIS — G4489 Other headache syndrome: Secondary | ICD-10-CM | POA: Diagnosis not present

## 2022-04-26 DIAGNOSIS — Z853 Personal history of malignant neoplasm of breast: Secondary | ICD-10-CM

## 2022-04-26 DIAGNOSIS — Z888 Allergy status to other drugs, medicaments and biological substances status: Secondary | ICD-10-CM

## 2022-04-26 DIAGNOSIS — F419 Anxiety disorder, unspecified: Secondary | ICD-10-CM | POA: Diagnosis present

## 2022-04-26 DIAGNOSIS — Z85828 Personal history of other malignant neoplasm of skin: Secondary | ICD-10-CM

## 2022-04-26 DIAGNOSIS — L8990 Pressure ulcer of unspecified site, unspecified stage: Secondary | ICD-10-CM | POA: Insufficient documentation

## 2022-04-26 DIAGNOSIS — E785 Hyperlipidemia, unspecified: Secondary | ICD-10-CM | POA: Diagnosis present

## 2022-04-26 DIAGNOSIS — Z923 Personal history of irradiation: Secondary | ICD-10-CM

## 2022-04-26 DIAGNOSIS — I10 Essential (primary) hypertension: Secondary | ICD-10-CM | POA: Diagnosis not present

## 2022-04-26 DIAGNOSIS — Z993 Dependence on wheelchair: Secondary | ICD-10-CM

## 2022-04-26 DIAGNOSIS — Z6836 Body mass index (BMI) 36.0-36.9, adult: Secondary | ICD-10-CM

## 2022-04-26 DIAGNOSIS — Z79899 Other long term (current) drug therapy: Secondary | ICD-10-CM

## 2022-04-26 HISTORY — DX: Raised antibody titer: R76.0

## 2022-04-26 HISTORY — DX: Stiff-man syndrome: G25.82

## 2022-04-26 LAB — CBC WITH DIFFERENTIAL/PLATELET
Abs Immature Granulocytes: 0.03 10*3/uL (ref 0.00–0.07)
Basophils Absolute: 0 10*3/uL (ref 0.0–0.1)
Basophils Relative: 1 %
Eosinophils Absolute: 0.2 10*3/uL (ref 0.0–0.5)
Eosinophils Relative: 2 %
HCT: 40.6 % (ref 36.0–46.0)
Hemoglobin: 12.9 g/dL (ref 12.0–15.0)
Immature Granulocytes: 0 %
Lymphocytes Relative: 11 %
Lymphs Abs: 1 10*3/uL (ref 0.7–4.0)
MCH: 29.9 pg (ref 26.0–34.0)
MCHC: 31.8 g/dL (ref 30.0–36.0)
MCV: 94 fL (ref 80.0–100.0)
Monocytes Absolute: 0.6 10*3/uL (ref 0.1–1.0)
Monocytes Relative: 7 %
Neutro Abs: 6.9 10*3/uL (ref 1.7–7.7)
Neutrophils Relative %: 79 %
Platelets: 319 10*3/uL (ref 150–400)
RBC: 4.32 MIL/uL (ref 3.87–5.11)
RDW: 13.4 % (ref 11.5–15.5)
WBC: 8.7 10*3/uL (ref 4.0–10.5)
nRBC: 0 % (ref 0.0–0.2)

## 2022-04-26 LAB — LACTIC ACID, PLASMA: Lactic Acid, Venous: 0.9 mmol/L (ref 0.5–1.9)

## 2022-04-26 LAB — URINALYSIS, ROUTINE W REFLEX MICROSCOPIC
Bilirubin Urine: NEGATIVE
Glucose, UA: NEGATIVE mg/dL
Ketones, ur: NEGATIVE mg/dL
Nitrite: POSITIVE — AB
Protein, ur: 100 mg/dL — AB
Specific Gravity, Urine: 1.009 (ref 1.005–1.030)
WBC, UA: >50 WBC/hpf (ref 0–5)
pH: 5 (ref 5.0–8.0)

## 2022-04-26 LAB — COMPREHENSIVE METABOLIC PANEL
ALT: 8 U/L (ref 0–44)
AST: 13 U/L — ABNORMAL LOW (ref 15–41)
Albumin: 3.3 g/dL — ABNORMAL LOW (ref 3.5–5.0)
Alkaline Phosphatase: 68 U/L (ref 38–126)
Anion gap: 10 (ref 5–15)
BUN: 9 mg/dL (ref 8–23)
CO2: 29 mmol/L (ref 22–32)
Calcium: 8.7 mg/dL — ABNORMAL LOW (ref 8.9–10.3)
Chloride: 93 mmol/L — ABNORMAL LOW (ref 98–111)
Creatinine, Ser: 0.61 mg/dL (ref 0.44–1.00)
GFR, Estimated: >60 mL/min (ref >60)
Glucose, Bld: 110 mg/dL — ABNORMAL HIGH (ref 70–99)
Potassium: 3.5 mmol/L (ref 3.5–5.1)
Sodium: 132 mmol/L — ABNORMAL LOW (ref 135–145)
Total Bilirubin: 0.6 mg/dL (ref 0.3–1.2)
Total Protein: 6.7 g/dL (ref 6.5–8.1)

## 2022-04-26 LAB — LIPASE, BLOOD: Lipase: 27 U/L (ref 11–51)

## 2022-04-26 MED ORDER — SODIUM CHLORIDE 0.9 % IV SOLN
1.0000 g | Freq: Once | INTRAVENOUS | Status: AC
  Administered 2022-04-26: 1 g via INTRAVENOUS
  Filled 2022-04-26: qty 10

## 2022-04-26 MED ORDER — SODIUM CHLORIDE 0.9 % IV SOLN
1.0000 g | INTRAVENOUS | Status: DC
  Administered 2022-04-27 – 2022-04-30 (×4): 1 g via INTRAVENOUS
  Filled 2022-04-26 (×4): qty 10

## 2022-04-26 MED ORDER — ACETAMINOPHEN 325 MG PO TABS
650.0000 mg | ORAL_TABLET | Freq: Four times a day (QID) | ORAL | Status: DC | PRN
  Administered 2022-04-27 – 2022-04-30 (×4): 650 mg via ORAL
  Filled 2022-04-26 (×4): qty 2

## 2022-04-26 MED ORDER — PROBIOTIC GUMMIES 30 MG PO CHEW: CHEWABLE_TABLET | Freq: Every morning | ORAL | Status: DC

## 2022-04-26 MED ORDER — IOHEXOL 300 MG/ML  SOLN
100.0000 mL | Freq: Once | INTRAMUSCULAR | Status: AC | PRN
  Administered 2022-04-26: 100 mL via INTRAVENOUS

## 2022-04-26 MED ORDER — RISAQUAD PO CAPS
1.0000 | ORAL_CAPSULE | Freq: Every day | ORAL | Status: DC
  Administered 2022-04-27 – 2022-04-30 (×4): 1 via ORAL
  Filled 2022-04-26 (×4): qty 1

## 2022-04-26 MED ORDER — DOCUSATE SODIUM 100 MG PO CAPS
100.0000 mg | ORAL_CAPSULE | Freq: Two times a day (BID) | ORAL | Status: DC
  Administered 2022-04-26 – 2022-04-30 (×8): 100 mg via ORAL
  Filled 2022-04-26 (×8): qty 1

## 2022-04-26 MED ORDER — BREXPIPRAZOLE 0.25 MG PO TABS: 0.2500 mg | ORAL_TABLET | Freq: Every day | ORAL | Status: DC

## 2022-04-26 MED ORDER — NYSTATIN 100000 UNIT/GM EX POWD
Freq: Two times a day (BID) | CUTANEOUS | Status: DC
  Administered 2022-04-27: 1 via TOPICAL
  Filled 2022-04-26: qty 15

## 2022-04-26 MED ORDER — SODIUM CHLORIDE 0.9 % IV BOLUS (SEPSIS): 500.0000 mL | Freq: Once | INTRAVENOUS | Status: DC

## 2022-04-26 MED ORDER — ALBUTEROL SULFATE (2.5 MG/3ML) 0.083% IN NEBU: 2.5000 mg | INHALATION_SOLUTION | RESPIRATORY_TRACT | Status: DC | PRN

## 2022-04-26 MED ORDER — BREXPIPRAZOLE 1 MG PO TABS
0.5000 mg | ORAL_TABLET | Freq: Every day | ORAL | Status: DC
  Administered 2022-04-27 – 2022-04-30 (×4): 0.5 mg via ORAL
  Filled 2022-04-26 (×2): qty 0.5
  Filled 2022-04-26: qty 2
  Filled 2022-04-26 (×3): qty 0.5

## 2022-04-26 MED ORDER — DULOXETINE HCL 20 MG PO CPEP
40.0000 mg | ORAL_CAPSULE | Freq: Every day | ORAL | Status: DC
  Administered 2022-04-26 – 2022-04-29 (×4): 40 mg via ORAL
  Filled 2022-04-26 (×4): qty 2

## 2022-04-26 MED ORDER — NYSTATIN-TRIAMCINOLONE 100000-0.1 UNIT/GM-% EX OINT: 1.0000 | TOPICAL_OINTMENT | Freq: Two times a day (BID) | CUTANEOUS | 0 refills | Status: DC

## 2022-04-26 MED ORDER — NYSTATIN-TRIAMCINOLONE 100000-0.1 UNIT/GM-% EX CREA
TOPICAL_CREAM | Freq: Once | CUTANEOUS | Status: AC
  Filled 2022-04-26: qty 30

## 2022-04-26 MED ORDER — TRAZODONE HCL 50 MG PO TABS: 25.0000 mg | ORAL_TABLET | Freq: Every evening | ORAL | Status: DC | PRN

## 2022-04-26 MED ORDER — NYSTATIN 100000 UNIT/GM EX POWD
Freq: Once | CUTANEOUS | Status: DC
  Filled 2022-04-26: qty 15

## 2022-04-26 MED ORDER — SODIUM CHLORIDE 0.9 % IV BOLUS (SEPSIS)
1000.0000 mL | Freq: Once | INTRAVENOUS | Status: AC
  Administered 2022-04-26: 1000 mL via INTRAVENOUS

## 2022-04-26 MED ORDER — LEVOTHYROXINE SODIUM 75 MCG PO TABS
150.0000 ug | ORAL_TABLET | Freq: Every day | ORAL | Status: DC
  Administered 2022-04-27 – 2022-04-30 (×4): 150 ug via ORAL
  Filled 2022-04-26 (×4): qty 2

## 2022-04-26 MED ORDER — SODIUM CHLORIDE 0.9 % IV SOLN: INTRAVENOUS | Status: DC

## 2022-04-26 MED ORDER — CEFDINIR 300 MG PO CAPS: 300.0000 mg | ORAL_CAPSULE | Freq: Two times a day (BID) | ORAL | 0 refills | Status: DC

## 2022-04-26 MED ORDER — APIXABAN 5 MG PO TABS
5.0000 mg | ORAL_TABLET | Freq: Two times a day (BID) | ORAL | Status: DC
  Administered 2022-04-26 – 2022-04-27 (×2): 5 mg via ORAL
  Filled 2022-04-26 (×2): qty 1

## 2022-04-26 MED ORDER — ONDANSETRON HCL 4 MG PO TABS: 4.0000 mg | ORAL_TABLET | Freq: Four times a day (QID) | ORAL | Status: DC | PRN

## 2022-04-26 MED ORDER — ONDANSETRON HCL 4 MG/2ML IJ SOLN: 4.0000 mg | Freq: Four times a day (QID) | INTRAMUSCULAR | Status: DC | PRN

## 2022-04-26 MED ORDER — DIAZEPAM 5 MG PO TABS
5.0000 mg | ORAL_TABLET | Freq: Four times a day (QID) | ORAL | Status: DC | PRN
  Administered 2022-04-28 – 2022-04-30 (×3): 5 mg via ORAL
  Filled 2022-04-26 (×3): qty 1

## 2022-04-26 MED ORDER — ACETAMINOPHEN 650 MG RE SUPP: 650.0000 mg | Freq: Four times a day (QID) | RECTAL | Status: DC | PRN

## 2022-04-26 MED ORDER — POLYETHYLENE GLYCOL 3350 17 G PO PACK
17.0000 g | PACK | Freq: Every day | ORAL | Status: DC
  Administered 2022-04-26 – 2022-04-30 (×5): 17 g via ORAL
  Filled 2022-04-26 (×5): qty 1

## 2022-04-26 MED ORDER — SODIUM CHLORIDE (PF) 0.9 % IJ SOLN
INTRAMUSCULAR | Status: AC
  Filled 2022-04-26: qty 50

## 2022-04-26 NOTE — Discharge Instructions (Addendum)
You have been seen in the Emergency Department (ED) today and your workup today suggests that you have a a urinary tract infection (UTI).  You have been prescribed an antibiotic called cefdinir. Take this medication as prescribed and do not miss any doses. You may use over-the-counter pain medication (Tylenol or Motrin) as needed, but no more than recommended on the label instructions.  Drink PLENTY of fluids.  Use the nystatin triamcinolone cream for the yeast infection of your groin region and discomfort.  Continue applying protective dressings over your sacral wound.  Call your regular doctor to schedule the next available appointment (ideally within one week) to follow up on today's ED visit. Return immediately to the ED if your pain worsens, you develop back pain or you have decreased urine production, develop persistent fever >100.4, persistent vomiting such that you can't hold down your antibiotics, or other symptoms that concern you.

## 2022-04-26 NOTE — ED Notes (Signed)
Patient is now being admitted. [facility] has been cancelled

## 2022-04-26 NOTE — ED Notes (Signed)
ED TO INPATIENT HANDOFF REPORT  ED Nurse Name and Phone #: Arnetha Gula Name/Age/Gender Carmen Haney 89 y.o. female Room/Bed: WA09/WA09  Code Status   Code Status: Full Code  Home/SNF/Other Nursing Home Patient oriented to: self, place, time, and situation Is this baseline? Yes   Triage Complete: Triage complete  Chief Complaint UTI (urinary tract infection) due to urinary indwelling Foley catheter [Z61.096E, N39.0]  Triage Note Pt arrived from [facility] for vaginal pain from urinary catheter. Urine noted to be cloudy. Pt bedbound at baseline, reports abdominal pain also   Allergies Allergies  Allergen Reactions   Ace Inhibitors Cough   Latex Rash    Level of Care/Admitting Diagnosis ED Disposition     ED Disposition  Admit   Condition  --   Comment  Hospital Area: [facility] [[id number]]  Level of Care: Med-Surg [16]  May place patient in observation at [facility] or [facility] if equivalent level of care is available:: Yes  Covid Evaluation: Asymptomatic - no recent exposure (last 10 days) testing not required  Diagnosis: UTI (urinary tract infection) due to urinary indwelling Foley catheter [[id number]]  Admitting Physician: Maryln Gottron [[id number]]  Attending Physician: Kirby Crigler, MIR [email or web address] [[id number]]          B Medical/Surgery History Past Medical History:  Diagnosis Date   ACE-inhibitor cough    Breast CA 2010   s/p lumpectomy & radiation   CAD (coronary artery disease) 07/1998   s/p cath in 2000. 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    Thyroid disease    Vertebral fracture, pathological    Past Surgical  History:  Procedure Laterality Date   BREAST BIOPSY Left    BREAST LUMPECTOMY Right March 2010   BREAST LUMPECTOMY WITH RADIOACTIVE SEED LOCALIZATION Left 09/20/2014   Procedure: LEFT BREAST LUMPECTOMY WITH RADIOACTIVE SEED LOCALIZATION;  Surgeon: [name], MD;  Location: [facility];  Service: General;  Laterality: Left;   CARPAL TUNNEL RELEASE Right    CESAREAN SECTION     EYE SURGERY     Right Eye   Skin Cancer Removed from Right Forearm     TOTAL ABDOMINAL HYSTERECTOMY       A IV Location/Drains/Wounds Patient Lines/Drains/Airways Status     Active Line/Drains/Airways     Name Placement date Placement time Site Days   Peripheral IV 04/26/22 20 G Right;Posterior Forearm 04/26/22  1435  Forearm  less than 1   Urethral Catheter Robbie Lis Latex 14 Fr. 04/13/22  0153  Latex  13            Intake/Output Last 24 hours No intake or output data in the 24 hours ending 04/26/22 1602  Labs/Imaging Results for orders placed or performed during the hospital encounter of 04/26/22 (from the past 48 hour(s))  Urinalysis, Routine w reflex microscopic -Urine, Catheterized; Indwelling urinary catheter     Status: Abnormal   Collection Time: 04/26/22  7:13 AM  Result Value Ref Range   Color, Urine YELLOW YELLOW   APPearance CLOUDY (A) CLEAR   Specific Gravity, Urine 1.009 1.005 - 1.030  pH 5.0 5.0 - 8.0   Glucose, UA NEGATIVE NEGATIVE mg/dL   Hgb urine dipstick MODERATE (A) NEGATIVE   Bilirubin Urine NEGATIVE NEGATIVE   Ketones, ur NEGATIVE NEGATIVE mg/dL   Protein, ur 562 (A) NEGATIVE mg/dL   Nitrite POSITIVE (A) NEGATIVE   Leukocytes,Ua LARGE (A) NEGATIVE   RBC / HPF 21-50 0 - 5 RBC/hpf   WBC, UA >50 0 - 5 WBC/hpf   Bacteria, UA MANY (A) NONE SEEN   Squamous Epithelial / HPF 0-5 0 - 5 /HPF   WBC Clumps PRESENT    Mucus PRESENT     Comment: Performed at [facility], [address] [place], [place] [address]  Comprehensive metabolic  panel     Status: Abnormal   Collection Time: 04/26/22  7:56 AM  Result Value Ref Range   Sodium 132 (L) 135 - 145 mmol/L   Potassium 3.5 3.5 - 5.1 mmol/L   Chloride 93 (L) 98 - 111 mmol/L   CO2 29 22 - 32 mmol/L   Glucose, Bld 110 (H) 70 - 99 mg/dL    Comment: Glucose reference range applies only to samples taken after fasting for at least 8 hours.   BUN 9 8 - 23 mg/dL   Creatinine, Ser 3.73 0.44 - 1.00 mg/dL   Calcium 8.7 (L) 8.9 - 10.3 mg/dL   Total Protein 6.7 6.5 - 8.1 g/dL   Albumin 3.3 (L) 3.5 - 5.0 g/dL   AST 13 (L) 15 - 41 U/L   ALT 8 0 - 44 U/L   Alkaline Phosphatase 68 38 - 126 U/L   Total Bilirubin 0.6 0.3 - 1.2 mg/dL   GFR, Estimated >42 >87 mL/min    Comment: (NOTE) Calculated using the CKD-EPI Creatinine Equation (2021)    Anion gap 10 5 - 15    Comment: Performed at [facility], [address], [place], [place] [address]  CBC with Differential     Status: None   Collection Time: 04/26/22  7:56 AM  Result Value Ref Range   WBC 8.7 4.0 - 10.5 K/uL   RBC 4.32 3.87 - 5.11 MIL/uL   Hemoglobin 12.9 12.0 - 15.0 g/dL   HCT 72.6 20.3 - 55.9 %   MCV 94.0 80.0 - 100.0 fL   MCH 29.9 26.0 - 34.0 pg   MCHC 31.8 30.0 - 36.0 g/dL   RDW 74.1 63.8 - 45.3 %   Platelets 319 150 - 400 K/uL   nRBC 0.0 0.0 - 0.2 %   Neutrophils Relative % 79 %   Neutro Abs 6.9 1.7 - 7.7 K/uL   Lymphocytes Relative 11 %   Lymphs Abs 1.0 0.7 - 4.0 K/uL   Monocytes Relative 7 %   Monocytes Absolute 0.6 0.1 - 1.0 K/uL   Eosinophils Relative 2 %   Eosinophils Absolute 0.2 0.0 - 0.5 K/uL   Basophils Relative 1 %   Basophils Absolute 0.0 0.0 - 0.1 K/uL   Immature Granulocytes 0 %   Abs Immature Granulocytes 0.03 0.00 - 0.07 K/uL    Comment: Performed at [facility], [address] [place], [place] [address]  Lipase, blood     Status: None   Collection Time: 04/26/22  7:56 AM  Result Value Ref Range   Lipase 27 11 - 51 U/L    Comment: Performed at [facility], [place], [address], [place], [place] [address]   CT ABDOMEN PELVIS W CONTRAST  Result Date:  04/26/2022 CLINICAL DATA:  Abdominal pain. Persistent rectal and vaginal pain since last visit. Chronic constipation. EXAM: CT ABDOMEN AND PELVIS WITH CONTRAST TECHNIQUE: Multidetector CT imaging of the abdomen and pelvis was performed using the standard protocol following bolus administration of intravenous contrast. RADIATION DOSE REDUCTION: This exam was performed according to the departmental dose-optimization program which includes automated exposure control, adjustment of the mA and/or kV according to patient size and/or use of iterative reconstruction technique. CONTRAST:  OMNIPAQUE IOHEXOL 300 MG/ML  SOLN COMPARISON:  02/11/2022 FINDINGS: Lower chest: No pleural fluid or airspace consolidation. Mild scarring identified within the visualized lung bases. Hepatobiliary: No suspicious liver abnormality. Again seen are several small, less than 1 cm low-density liver lesions which likely represent cysts. The gallbladder appears normal. No CT visible stones, gallbladder wall inflammation, or bile duct dilatation. Pancreas: Unremarkable. No pancreatic ductal dilatation or surrounding inflammatory changes. Spleen: Normal in size without focal abnormality. Adrenals/Urinary Tract: Normal adrenal glands. Right parapelvic cyst is unchanged measuring 3 cm, image 32/2. Posterior cortex left kidney cyst is unchanged measuring 1.1 cm. Additional, less than 1 cm hypodense kidney lesions are noted which are technically too small to characterize compatible with benign Bosniak class 2 lesions. No follow-up imaging recommended. Upper pole right kidney stone measures 5 mm, image 62/4. No left kidney stones. No hydronephrosis identified bilaterally. The urinary bladder is partially decompressed around a Foley catheter. There is wall thickening and enhancement of the urinary bladder with surrounding soft tissue  haziness. The wall thickening may in part reflect incomplete distension. No focal bladder lesion identified Stomach/Bowel: Stomach is normal. No pathologic dilatation of the large or small bowel loops. No bowel wall thickening or inflammation. The appendix is visualized and appears normal. Vascular/Lymphatic: No aneurysm. Mild aortic atherosclerotic calcifications. No signs of abdominopelvic adenopathy. Reproductive: Aortic atherosclerosis. No enlarged abdominal or pelvic lymph nodes. Other: There is new presacral soft tissue edema. No free fluid or fluid collections identified. No signs of pneumoperitoneum. Musculoskeletal: No acute findings. Bones appear diffusely osteopenic. Stable compression deformity involving the T12 vertebra with loss of approximately 30% of the superior endplate height. Mild L3 central vertebral body endplate deformity is unchanged. IMPRESSION: 1. The urinary bladder is partially decompressed around a Foley catheter. There is wall thickening and enhancement of the urinary bladder with surrounding soft tissue haziness. The wall thickening may in part reflect incomplete distension. Correlate for any clinical signs or symptoms of cystitis. 2. New presacral soft tissue edema. Etiology is indeterminate. 3. Nonobstructing right renal calculus. 4. Stable T12 and L3 vertebral body compression deformities. 5.  Aortic Atherosclerosis (ICD10-I70.0). Electronically Signed   By: Signa Kell M.D.   On: 04/26/2022 09:53    Pending Labs Unresulted Labs (From admission, onward)     Start     Ordered   04/27/22 0500  Basic metabolic panel  Tomorrow morning,   R        04/26/22 1518   04/27/22 0500  CBC  Tomorrow morning,   R        04/26/22 1518   04/26/22 1517  Lactic acid, plasma  STAT Now then every 3 hours,   R (with STAT occurrences)      04/26/22 1516   04/26/22 1344  Blood culture (routine x 2)  BLOOD CULTURE X 2,   R (with STAT occurrences)      04/26/22 1344   04/26/22 0713  Urine  Culture  Once,   URGENT       Question:  Indication  Answer:  Dysuria   04/26/22 0712            Vitals/Pain Today's Vitals   04/26/22 1137 04/26/22 1139 04/26/22 1340 04/26/22 1430  BP:  (!) 140/114 (!) 103/47 97/60  Pulse: (!) 105 (!) 106 98 77  Resp:  15 (!) 22 (!) 22  Temp:  98 F (36.7 C) 98.5 F (36.9 C)   TempSrc:  Oral Oral   SpO2: 94% 93% 95% 90%    Isolation Precautions No active isolations  Medications Medications  0.9 %  sodium chloride infusion (has no administration in time range)  brexpiprazole (REXULTI) tablet 0.25 mg (has no administration in time range)  diazepam (VALIUM) tablet 5 mg (has no administration in time range)  DULoxetine HCl CPEP 40 mg (has no administration in time range)  levothyroxine (SYNTHROID) tablet 150 mcg (has no administration in time range)  docusate sodium (COLACE) capsule 100 mg (has no administration in time range)  polyethylene glycol (MIRALAX / GLYCOLAX) packet 17 g (has no administration in time range)  apixaban (ELIQUIS) tablet 5 mg (has no administration in time range)  acetaminophen (TYLENOL) tablet 650 mg (has no administration in time range)    Or  acetaminophen (TYLENOL) suppository 650 mg (has no administration in time range)  traZODone (DESYREL) tablet 25 mg (has no administration in time range)  ondansetron (ZOFRAN) tablet 4 mg (has no administration in time range)    Or  ondansetron (ZOFRAN) injection 4 mg (has no administration in time range)  albuterol (PROVENTIL) (2.5 MG/3ML) 0.083% nebulizer solution 2.5 mg (has no administration in time range)  cefTRIAXone (ROCEPHIN) 1 g in sodium chloride 0.9 % 100 mL IVPB (has no administration in time range)  acidophilus (RISAQUAD) capsule 1 capsule (has no administration in time range)  cefTRIAXone (ROCEPHIN) 1 g in sodium chloride 0.9 % 100 mL IVPB (0 g Intravenous Stopped 04/26/22 1128)  nystatin-triamcinolone (MYCOLOG II) cream ( Topical Given 04/26/22 0952)  iohexol  (OMNIPAQUE) 300 MG/ML solution 100 mL (100 mLs Intravenous Contrast Given 04/26/22 0912)  sodium chloride 0.9 % bolus 1,000 mL (1,000 mLs Intravenous New Bag/Given 04/26/22 1437)    Mobility non-ambulatory     Focused Assessments Cardiac Assessment Handoff:    Lab Results  Component Value Date   CKTOTAL 132 02/12/2022   No results found for: "DDIMER" Does the Patient currently have chest pain? No    R Recommendations: See Admitting Provider Note  Report given to:   Additional Notes:

## 2022-04-26 NOTE — ED Triage Notes (Signed)
Pt arrived from [facility] for vaginal pain from urinary catheter. Urine noted to be cloudy. Pt bedbound at baseline, reports abdominal pain also

## 2022-04-26 NOTE — ED Notes (Signed)
Called [facility] and scheduled transportation because daughters cannot come

## 2022-04-26 NOTE — H&P (Signed)
History and Physical  [name]:[id number] DOB: April 19, 1930 DOA: 04/26/2022  PCP: Eloisa Northern, MD   Chief Complaint: Lower pelvic pain  HPI: Carmen Haney is a 89 y.o. female with medical history significant for stiff person syndrome, paroxysmal atrial fibrillation, hypertension, hyperlipidemia and chronic diastolic congestive heart failure with chronic Foley catheter, be admitted to the hospital with catheter associated UTI.  Patient was brought in from her nursing facility due to complaints of lower pelvic/vaginal pain.  She denies any fevers, chills, nausea, vomiting.  She has a Foley catheter because she is bedbound due to stiff person syndrome.  ED Course: Evaluation in the ER revealed normal vital signs, relatively unremarkable labs, and evidence of UTI on her urinalysis.  Initially the plan was for her to discharge back to her facility on oral antibiotics, however she started to get hypotensive with blood pressure 97/60, so she was given more fluids, and observation admission was requested.  Elderly obese female  Review of Systems: Please see HPI for pertinent positives and negatives. A complete 10 system review of systems are otherwise negative.  Past Medical History:  Diagnosis Date   ACE-inhibitor cough    Breast CA 2010   s/p lumpectomy & radiation   CAD (coronary artery disease) 07/1998   s/p cath in 2000. 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    Thyroid disease    Vertebral fracture, pathological    Past Surgical History:  Procedure Laterality Date   BREAST BIOPSY Left    BREAST LUMPECTOMY Right March 2010   BREAST LUMPECTOMY WITH RADIOACTIVE SEED LOCALIZATION Left  09/20/2014   Procedure: LEFT BREAST LUMPECTOMY WITH RADIOACTIVE SEED LOCALIZATION;  Surgeon: [name], MD;  Location: [facility];  Service: General;  Laterality: Left;   CARPAL TUNNEL RELEASE Right    CESAREAN SECTION     EYE SURGERY     Right Eye   Skin Cancer Removed from Right Forearm     TOTAL ABDOMINAL HYSTERECTOMY      Social History:  reports that she quit smoking about 59 years ago. Her smoking use included cigarettes. She has never used smokeless tobacco. She reports that she does not drink alcohol and does not use drugs.   Allergies  Allergen Reactions   Ace Inhibitors Cough   Latex Rash    Family History  Problem Relation Age of Onset   Coronary artery disease Mother    Hypertension Mother    Melanoma Mother    Cancer Mother 79       breast   Breast cancer Mother    Cancer Father 52       prostate   Cancer Sister 62       breast   Breast cancer Sister    Cancer Maternal Aunt 45       breast   Cancer Sister 68       breast   Breast cancer Sister    Breast cancer Daughter 41     Prior to Admission medications   Medication Sig Start Date End Date Taking? Authorizing Provider  cefdinir (OMNICEF) 300 MG capsule Take 1 capsule (  300 mg total) by mouth 2 (two) times daily for 7 days. 04/26/22 05/03/22 Yes [name], MD  nystatin-triamcinolone ointment [facility]) Apply 1 Application topically 2 (two) times daily. 04/26/22  Yes [name], MD  acetaminophen (TYLENOL) 500 MG tablet Take 1,000 mg by mouth in the morning and at bedtime.    [provider]  apixaban (ELIQUIS) 5 MG TABS tablet Take 1 tablet (5 mg total) by mouth 2 (two) times daily. 02/16/21   [facility], Roe Rutherford, PA  Baclofen 5 MG TABS Take 1 tablet (5 mg total) by mouth 3 (three) times daily. 04/22/22   Antony Madura, MD  brexpiprazole (REXULTI) 0.25 MG TABS tablet Take 1 tablet (0.25 mg total) by mouth daily. 02/05/22   Fargo, Amy E, NP  Calcium-Phosphorus-Vitamin  D (CALCIUM/D3 ADULT GUMMIES PO) Take 500 mg by mouth in the morning.    [provider]  diazepam (VALIUM) 5 MG tablet Take 1 tablet (5 mg total) by mouth every 6 (six) hours as needed for muscle spasms. This medication is to be administered on a regular basis, not on an as-needed basis. 04/22/22   Antony Madura, MD  docusate sodium (COLACE) 100 MG capsule Take 1 capsule (100 mg total) by mouth 2 (two) times daily. 12/01/21   [name], MD  DULoxetine 40 MG CPEP Take 1 capsule (40 mg total) by mouth at bedtime. 03/19/22   Danford, Earl Lites, MD  furosemide (LASIX) 20 MG tablet Take 20 mg by mouth daily.    [provider]  levothyroxine (SYNTHROID) 150 MCG tablet Take 1 tablet (150 mcg total) by mouth daily before breakfast. 03/13/22   Fargo, Amy E, NP  metoprolol tartrate (LOPRESSOR) 25 MG tablet Take 0.5 tablets (12.5 mg total) by mouth 2 (two) times daily. 02/16/21   [facility], Roe Rutherford, PA  polyethylene glycol (MIRALAX / GLYCOLAX) 17 g packet Take 17 g by mouth daily.    [provider]  Probiotic Product (PROBIOTIC GUMMIES PO) Take 2 tablets by mouth in the morning.    [provider]  olmesartan (BENICAR) 20 MG tablet Take 20 mg by mouth daily.    04/25/10  [provider]    Physical Exam: BP 97/60   Pulse 77   Temp 98.5 F (36.9 C) (Oral)   Resp (!) 22   SpO2 90%   General: Obese female lying in bed lying in bed, awake and alert, oriented x 4 Eyes: EOMI, clear conjuctivae, white sclerea Neck: supple, no masses, trachea mildline  Cardiovascular: RRR, no murmurs or rubs, no peripheral edema  Respiratory: clear to auscultation bilaterally, no wheezes, no crackles  Abdomen: soft, nontender, nondistended, normal bowel tones heard  Skin: dry, no rashes  Musculoskeletal: no joint effusions, normal range of motion  Psychiatric: appropriate affect, normal speech  Neurologic: extraocular muscles intact, clear speech bilateral lower  extremities with, 0 out of 5 strength and no sensation         Labs on Admission:  Basic Metabolic Panel: Recent Labs  Lab 04/26/22 0756  NA 132*  K 3.5  CL 93*  CO2 29  GLUCOSE 110*  BUN 9  CREATININE 0.61  CALCIUM 8.7*   Liver Function Tests: Recent Labs  Lab 04/26/22 0756  AST 13*  ALT 8  ALKPHOS 68  BILITOT 0.6  PROT 6.7  ALBUMIN 3.3*   Recent Labs  Lab 04/26/22 0756  LIPASE 27   No results for input(s): "AMMONIA" in the last 168  hours. CBC: Recent Labs  Lab 04/26/22 0756  WBC 8.7  NEUTROABS 6.9  HGB 12.9  HCT 40.6  MCV 94.0  PLT 319   Cardiac Enzymes: No results for input(s): "CKTOTAL", "CKMB", "CKMBINDEX", "TROPONINI" in the last 168 hours.  BNP (last 3 results) No results for input(s): "BNP" in the last 8760 hours.  ProBNP (last 3 results) No results for input(s): "PROBNP" in the last 8760 hours.  CBG: No results for input(s): "GLUCAP" in the last 168 hours.  Radiological Exams on Admission: CT ABDOMEN PELVIS W CONTRAST  Result Date: 04/26/2022 CLINICAL DATA:  Abdominal pain. Persistent rectal and vaginal pain since last visit. Chronic constipation. EXAM: CT ABDOMEN AND PELVIS WITH CONTRAST TECHNIQUE: Multidetector CT imaging of the abdomen and pelvis was performed using the standard protocol following bolus administration of intravenous contrast. RADIATION DOSE REDUCTION: This exam was performed according to the departmental dose-optimization program which includes automated exposure control, adjustment of the mA and/or kV according to patient size and/or use of iterative reconstruction technique. CONTRAST:  OMNIPAQUE IOHEXOL 300 MG/ML  SOLN COMPARISON:  02/11/2022 FINDINGS: Lower chest: No pleural fluid or airspace consolidation. Mild scarring identified within the visualized lung bases. Hepatobiliary: No suspicious liver abnormality. Again seen are several small, less than 1 cm low-density liver lesions which likely represent cysts. The  gallbladder appears normal. No CT visible stones, gallbladder wall inflammation, or bile duct dilatation. Pancreas: Unremarkable. No pancreatic ductal dilatation or surrounding inflammatory changes. Spleen: Normal in size without focal abnormality. Adrenals/Urinary Tract: Normal adrenal glands. Right parapelvic cyst is unchanged measuring 3 cm, image 32/2. Posterior cortex left kidney cyst is unchanged measuring 1.1 cm. Additional, less than 1 cm hypodense kidney lesions are noted which are technically too small to characterize compatible with benign Bosniak class 2 lesions. No follow-up imaging recommended. Upper pole right kidney stone measures 5 mm, image 62/4. No left kidney stones. No hydronephrosis identified bilaterally. The urinary bladder is partially decompressed around a Foley catheter. There is wall thickening and enhancement of the urinary bladder with surrounding soft tissue haziness. The wall thickening may in part reflect incomplete distension. No focal bladder lesion identified Stomach/Bowel: Stomach is normal. No pathologic dilatation of the large or small bowel loops. No bowel wall thickening or inflammation. The appendix is visualized and appears normal. Vascular/Lymphatic: No aneurysm. Mild aortic atherosclerotic calcifications. No signs of abdominopelvic adenopathy. Reproductive: Aortic atherosclerosis. No enlarged abdominal or pelvic lymph nodes. Other: There is new presacral soft tissue edema. No free fluid or fluid collections identified. No signs of pneumoperitoneum. Musculoskeletal: No acute findings. Bones appear diffusely osteopenic. Stable compression deformity involving the T12 vertebra with loss of approximately 30% of the superior endplate height. Mild L3 central vertebral body endplate deformity is unchanged. IMPRESSION: 1. The urinary bladder is partially decompressed around a Foley catheter. There is wall thickening and enhancement of the urinary bladder with surrounding soft  tissue haziness. The wall thickening may in part reflect incomplete distension. Correlate for any clinical signs or symptoms of cystitis. 2. New presacral soft tissue edema. Etiology is indeterminate. 3. Nonobstructing right renal calculus. 4. Stable T12 and L3 vertebral body compression deformities. 5.  Aortic Atherosclerosis (ICD10-I70.0). Electronically Signed   By: Signa Kell M.D.   On: 04/26/2022 09:53    Assessment/Plan Principal Problem:   UTI (urinary tract infection) due to urinary indwelling Foley catheter -not meeting sepsis criteria -Observation admission -Continue IV fluids -Continue IV Rocephin -Likely discharge back to nursing facility in the morning if she  remains stable with no further hypotension or evidence of impending SIRS  Chronic problems for which home medications continued once reconciled:  HTN (hypertension)   Hyperlipidemia   Hypothyroidism (acquired)   Chronic kidney disease (CKD), stage III (moderate)   Anxiety disorder   Atrial fibrillation  DVT prophylaxis: Eliquis    Code Status: Full Code  Consults called: None  Admission status: Observation  Time spent: 42 minutes  Sewell Pitner Sharlette Dense MD [facility] Pager [phone]  If 7PM-7AM, please contact night-coverage [email or web address] Password [facility]  04/26/2022, 4:01 PM

## 2022-04-26 NOTE — ED Notes (Signed)
Offered patient something to drink, and she said later. She will be calling one of her daughters to pick her up

## 2022-04-26 NOTE — ED Provider Notes (Addendum)
[place] EMERGENCY DEPARTMENT AT [facility] Provider Note   CSN: [id number] Arrival date & time: 04/26/22  4098     History  Chief Complaint  Patient presents with   Abdominal Pain    Carmen Haney is a 89 y.o. female.  With PMH of p. A-fib on Eliquis, HTN, HLD, hysterectomy presenting with abdominal pain and discomfort from Foley catheter.  Patient is mainly here complaining of discomfort around her Foley catheter site and lower abdominal region as well as decreased drainage and cloudy urine.  She has had no fevers, no vomiting, no chills, no flank pain.  Her catheter was last changed at the beginning of April.  She thinks she was on a antibiotic recently for UTI but does not know what is called and does not remember actually if she is still on it.  She has had decreased appetite over the past couple of days.  No CP, no SOB, no new weakness.  She has chronic weakness in her legs from previous accident requiring the Foley catheter per patient and is wheelchair-bound and bedbound.   Abdominal Pain      Home Medications Prior to Admission medications   Medication Sig Start Date End Date Taking? Authorizing Provider  cefdinir (OMNICEF) 300 MG capsule Take 1 capsule (300 mg total) by mouth 2 (two) times daily for 7 days. 04/26/22 05/03/22 Yes [name], MD  nystatin-triamcinolone ointment [facility]) Apply 1 Application topically 2 (two) times daily. 04/26/22  Yes [name], MD  acetaminophen (TYLENOL) 500 MG tablet Take 1,000 mg by mouth in the morning and at bedtime.    [provider]  apixaban (ELIQUIS) 5 MG TABS tablet Take 1 tablet (5 mg total) by mouth 2 (two) times daily. 02/16/21   [facility], Roe Rutherford, PA  Baclofen 5 MG TABS Take 1 tablet (5 mg total) by mouth 3 (three) times daily. 04/22/22   Antony Madura, MD  brexpiprazole (REXULTI) 0.25 MG TABS tablet Take 1 tablet (0.25 mg total) by mouth daily. 02/05/22   Fargo, Amy E, NP   Calcium-Phosphorus-Vitamin D (CALCIUM/D3 ADULT GUMMIES PO) Take 500 mg by mouth in the morning.    [provider]  diazepam (VALIUM) 5 MG tablet Take 1 tablet (5 mg total) by mouth every 6 (six) hours as needed for muscle spasms. This medication is to be administered on a regular basis, not on an as-needed basis. 04/22/22   Antony Madura, MD  docusate sodium (COLACE) 100 MG capsule Take 1 capsule (100 mg total) by mouth 2 (two) times daily. 12/01/21   [name], MD  DULoxetine 40 MG CPEP Take 1 capsule (40 mg total) by mouth at bedtime. 03/19/22   Danford, Earl Lites, MD  furosemide (LASIX) 20 MG tablet Take 20 mg by mouth daily.    [provider]  levothyroxine (SYNTHROID) 150 MCG tablet Take 1 tablet (150 mcg total) by mouth daily before breakfast. 03/13/22   Fargo, Amy E, NP  metoprolol tartrate (LOPRESSOR) 25 MG tablet Take 0.5 tablets (12.5 mg total) by mouth 2 (two) times daily. 02/16/21   [facility], Roe Rutherford, PA  polyethylene glycol (MIRALAX / GLYCOLAX) 17 g packet Take 17 g by mouth daily.    [provider]  Probiotic Product (PROBIOTIC GUMMIES PO) Take 2 tablets by mouth in the morning.    [provider]  olmesartan (BENICAR) 20 MG tablet Take 20 mg by mouth daily.    04/25/10  [provider]      Allergies    Ace inhibitors and Latex    Review of Systems   Review of Systems  Gastrointestinal:  Positive for abdominal pain.    Physical Exam Updated Vital Signs BP (!) 103/47 (BP Location: Right Arm)   Pulse 98   Temp 98.5 F (36.9 C) (Oral)   Resp (!) 22   SpO2 95%  Physical Exam Constitutional: Alert and oriented.  Chronically ill-appearing but no acute distress, nontoxic Eyes: Conjunctivae are normal. ENT      Head: Normocephalic and atraumatic. Cardiovascular: Regular rate, warm well-perfused Respiratory: Normal respiratory effort.  O2 sat 100 on RA Gastrointestinal: Distended but soft, mild suprapubic tenderness.  No  CVA tenderness.  GU exam performed with bedside nurse as chaperone, she has erythematous satellite regions and mild skin breakdown of the groin and vaginal region.  There is no purulent discharge or palpable fluctuance.  Previous indwelling Foley removed but previous bag contained cloudy yellow urine. Musculoskeletal: No pitting edema of lower extremities Neurologic: Normal speech and language.  Moving upper and lower extremities equally.  3 out of 5 strength bilateral lower extremities.  Sensation grossly intact.  No facial droop. Skin: Skin is warm, dry -stage I right-sided decubitus ulcer over the buttock with minimal surrounding erythema.  No purulent drainage.  No tenderness to palpation. Psychiatric: Mood and affect are normal. Speech and behavior are normal.  ED Results / Procedures / Treatments   Labs (all labs ordered are listed, but only abnormal results are displayed) Labs Reviewed  COMPREHENSIVE METABOLIC PANEL - Abnormal; Notable for the following components:      Result Value   Sodium 132 (*)    Chloride 93 (*)    Glucose, Bld 110 (*)    Calcium 8.7 (*)    Albumin 3.3 (*)    AST 13 (*)    All other components within normal limits  URINALYSIS, ROUTINE W REFLEX MICROSCOPIC - Abnormal; Notable for the following components:   APPearance CLOUDY (*)    Hgb urine dipstick MODERATE (*)    Protein, ur 100 (*)    Nitrite POSITIVE (*)    Leukocytes,Ua LARGE (*)    Bacteria, UA MANY (*)    All other components within normal limits  URINE CULTURE  CULTURE, BLOOD (ROUTINE X 2)  CULTURE, BLOOD (ROUTINE X 2)  CBC WITH DIFFERENTIAL/PLATELET  LIPASE, BLOOD    EKG None  Radiology CT ABDOMEN PELVIS W CONTRAST  Result Date: 04/26/2022 CLINICAL DATA:  Abdominal pain. Persistent rectal and vaginal pain since last visit. Chronic constipation. EXAM: CT ABDOMEN AND PELVIS WITH CONTRAST TECHNIQUE: Multidetector CT imaging of the abdomen and pelvis was performed using the standard protocol  following bolus administration of intravenous contrast. RADIATION DOSE REDUCTION: This exam was performed according to the departmental dose-optimization program which includes automated exposure control, adjustment of the mA and/or kV according to patient size and/or use of iterative reconstruction technique. CONTRAST:  OMNIPAQUE IOHEXOL 300 MG/ML  SOLN COMPARISON:  02/11/2022 FINDINGS: Lower chest: No pleural fluid or airspace consolidation. Mild scarring identified within the visualized lung bases. Hepatobiliary: No suspicious liver abnormality. Again seen are several small, less than 1 cm low-density liver lesions which likely represent cysts. The gallbladder appears normal. No CT visible stones, gallbladder wall inflammation, or bile duct dilatation. Pancreas: Unremarkable. No pancreatic ductal dilatation or surrounding inflammatory changes. Spleen: Normal in size without focal abnormality. Adrenals/Urinary Tract: Normal adrenal glands. Right parapelvic cyst is unchanged measuring 3 cm,  image 32/2. Posterior cortex left kidney cyst is unchanged measuring 1.1 cm. Additional, less than 1 cm hypodense kidney lesions are noted which are technically too small to characterize compatible with benign Bosniak class 2 lesions. No follow-up imaging recommended. Upper pole right kidney stone measures 5 mm, image 62/4. No left kidney stones. No hydronephrosis identified bilaterally. The urinary bladder is partially decompressed around a Foley catheter. There is wall thickening and enhancement of the urinary bladder with surrounding soft tissue haziness. The wall thickening may in part reflect incomplete distension. No focal bladder lesion identified Stomach/Bowel: Stomach is normal. No pathologic dilatation of the large or small bowel loops. No bowel wall thickening or inflammation. The appendix is visualized and appears normal. Vascular/Lymphatic: No aneurysm. Mild aortic atherosclerotic calcifications. No signs of  abdominopelvic adenopathy. Reproductive: Aortic atherosclerosis. No enlarged abdominal or pelvic lymph nodes. Other: There is new presacral soft tissue edema. No free fluid or fluid collections identified. No signs of pneumoperitoneum. Musculoskeletal: No acute findings. Bones appear diffusely osteopenic. Stable compression deformity involving the T12 vertebra with loss of approximately 30% of the superior endplate height. Mild L3 central vertebral body endplate deformity is unchanged. IMPRESSION: 1. The urinary bladder is partially decompressed around a Foley catheter. There is wall thickening and enhancement of the urinary bladder with surrounding soft tissue haziness. The wall thickening may in part reflect incomplete distension. Correlate for any clinical signs or symptoms of cystitis. 2. New presacral soft tissue edema. Etiology is indeterminate. 3. Nonobstructing right renal calculus. 4. Stable T12 and L3 vertebral body compression deformities. 5.  Aortic Atherosclerosis (ICD10-I70.0). Electronically Signed   By: Signa Kell M.D.   On: 04/26/2022 09:53    Procedures Procedures    Medications Ordered in ED Medications  sodium chloride 0.9 % bolus 1,000 mL (has no administration in time range)  cefTRIAXone (ROCEPHIN) 1 g in sodium chloride 0.9 % 100 mL IVPB (0 g Intravenous Stopped 04/26/22 1128)  nystatin-triamcinolone (MYCOLOG II) cream ( Topical Given 04/26/22 0952)  iohexol (OMNIPAQUE) 300 MG/ML solution 100 mL (100 mLs Intravenous Contrast Given 04/26/22 0912)    ED Course/ Medical Decision Making/ A&P                             Medical Decision Making ADALICIA BOISSELLE is a 89 y.o. female.  With PMH of p. A-fib on Eliquis, HTN, HLD, hysterectomy presenting with abdominal pain and discomfort from Foley catheter.  Patient has erythema and satellite regions of the groin regions and vagina consistent with likely candidal infection.  She also has complaints with indwelling Foley catheter  which was successfully replaced here and new UA obtained concerning for UTI with positive nitrite, large leukocyte Estrace and greater than 50 WBCs with many bacteria.  The symptoms are likely contributing to presentation today.  However, also considered pyelonephritis, nephrolithiasis, colitis among multiple other etiologies although less likely based off symptoms.  CTAP with contrast obtained, which I personally reviewed showing no hydronephrosis, which showed evidence of cystitis no pyelonephritis.  Nonobstructing renal stone.  Presacral soft tissue edema consistent with stage I small decubitus ulcer appreciated on physical exam.  Labs reviewed by me stable creatinine 0.61.  Normal white blood cell count 8.7, no left shift present.  Normal lipase no transaminitis.  Mild hyponatremia 132.  Patient not altered, not septic and Foley catheter working appropriately after replacement today.  Will discharge patient back to facility on continued cefdinir for UTI and  prescription for nystatin triamcinolone cream for groin candidal infection.  1:42 PM While patient was awaiting transport back to facility I was alerted by staff that she was having persistent tachycardia in the low 100s.  Most recent blood pressure soft 103/47.  Due to concern for developing urosepsis we will start patient on IV fluids send blood cultures and admit to hospitalist for observation.  2:10 PM Discussed with Dr. Erenest Blank on-call hospitalist will admit patient for UTI with SIRS symptoms.  Amount and/or Complexity of Data Reviewed Labs: ordered. Radiology: ordered.  Risk Prescription drug management. Decision regarding hospitalization.    Final Clinical Impression(s) / ED Diagnoses Final diagnoses:  Urinary tract infection without hematuria, site unspecified  Candida rash of groin  Pressure injury of right buttock, stage 1    Rx / DC Orders ED Discharge Orders          Ordered    cefdinir (OMNICEF) 300 MG capsule  2  times daily        04/26/22 1008    nystatin-triamcinolone ointment (MYCOLOG)  2 times daily        04/26/22 1008              [name], MD 04/26/22 1008    [name], MD 04/26/22 1410

## 2022-04-27 DIAGNOSIS — T83511A Infection and inflammatory reaction due to indwelling urethral catheter, initial encounter: Secondary | ICD-10-CM | POA: Diagnosis not present

## 2022-04-27 DIAGNOSIS — N390 Urinary tract infection, site not specified: Secondary | ICD-10-CM | POA: Diagnosis not present

## 2022-04-27 DIAGNOSIS — L8990 Pressure ulcer of unspecified site, unspecified stage: Secondary | ICD-10-CM | POA: Insufficient documentation

## 2022-04-27 LAB — BASIC METABOLIC PANEL
Anion gap: 8 (ref 5–15)
BUN: 7 mg/dL — ABNORMAL LOW (ref 8–23)
CO2: 27 mmol/L (ref 22–32)
Calcium: 8.1 mg/dL — ABNORMAL LOW (ref 8.9–10.3)
Chloride: 98 mmol/L (ref 98–111)
Creatinine, Ser: 0.49 mg/dL (ref 0.44–1.00)
GFR, Estimated: >60 mL/min (ref >60)
Glucose, Bld: 103 mg/dL — ABNORMAL HIGH (ref 70–99)
Potassium: 3.5 mmol/L (ref 3.5–5.1)
Sodium: 133 mmol/L — ABNORMAL LOW (ref 135–145)

## 2022-04-27 LAB — URINE CULTURE

## 2022-04-27 LAB — CBC
HCT: 33.7 % — ABNORMAL LOW (ref 36.0–46.0)
Hemoglobin: 10.8 g/dL — ABNORMAL LOW (ref 12.0–15.0)
MCH: 30.3 pg (ref 26.0–34.0)
MCHC: 32 g/dL (ref 30.0–36.0)
MCV: 94.4 fL (ref 80.0–100.0)
Platelets: 255 10*3/uL (ref 150–400)
RBC: 3.57 MIL/uL — ABNORMAL LOW (ref 3.87–5.11)
RDW: 13.3 % (ref 11.5–15.5)
WBC: 5.7 10*3/uL (ref 4.0–10.5)
nRBC: 0 % (ref 0.0–0.2)

## 2022-04-27 LAB — CULTURE, BLOOD (ROUTINE X 2)

## 2022-04-27 MED ORDER — BACLOFEN 10 MG PO TABS
5.0000 mg | ORAL_TABLET | Freq: Three times a day (TID) | ORAL | Status: DC
  Administered 2022-04-27 – 2022-04-30 (×10): 5 mg via ORAL
  Filled 2022-04-27 (×11): qty 1

## 2022-04-27 MED ORDER — CHLORHEXIDINE GLUCONATE CLOTH 2 % EX PADS
6.0000 | MEDICATED_PAD | Freq: Every day | CUTANEOUS | Status: DC
  Administered 2022-04-27 – 2022-04-30 (×4): 6 via TOPICAL

## 2022-04-27 NOTE — TOC Initial Note (Signed)
Transition of Care [facility]) - Initial/Assessment Note   Patient Details  Name: Carmen Haney MRN: [id number] Date of Birth: 10-09-1930  Transition of Care [facility]) CM/SW Contact:    Ewing Schlein, LCSW Phone Number: 04/27/2022, 11:31 AM  Clinical Narrative: [facility] consulted as patient is from an ALF. CSW spoke with daughter regarding discharge planning. Per daughter, the patient has been a resident at [facility] for the past 3 weeks following rehab at [facility]. Daughter reported the family has not felt comfortable with the care the patient has been receiving at the ALF due to staff not understanding how to use the lift for the patient. Family is considering taking the patient home with HH instead of returning to the ALF and have used [facility] for several years. TOC to follow for discharge planning.  Expected Discharge Plan: Home w Home Health Services Barriers to Discharge: Continued Medical Work up  Patient Goals and CMS Choice Patient states their goals for this hospitalization and ongoing recovery are:: Bring patient home with [facility] services CMS Medicare.gov Compare Post Acute Care list provided to:: Patient Represenative (must comment) Choice offered to / list presented to : Adult Children  Expected Discharge Plan and Services In-house Referral: Clinical Social Work Living arrangements for the past 2 months: Assisted Living Facility           DME Arranged: N/A DME Agency: NA  Prior Living Arrangements/Services Living arrangements for the past 2 months: Assisted Living Facility Lives with:: Facility Resident Patient language and need for interpreter reviewed:: Yes Do you feel safe going back to the place where you live?: Yes      Need for Family Participation in Patient Care: Yes (Comment) Care giver support system in place?: Yes (comment) Criminal Activity/Legal Involvement Pertinent to Current Situation/Hospitalization: No - Comment as needed  Activities of Daily  Living Home Assistive Devices/Equipment: Other (Comment) (pt from snf) ADL Screening (condition at time of admission) Patient's cognitive ability adequate to safely complete daily activities?: Yes Is the patient deaf or have difficulty hearing?: Yes Does the patient have difficulty seeing, even when wearing glasses/contacts?: No Does the patient have difficulty concentrating, remembering, or making decisions?: No Patient able to express need for assistance with ADLs?: Yes Does the patient have difficulty dressing or bathing?: Yes Independently performs ADLs?: No Communication: Independent Is this a change from baseline?: Pre-admission baseline Dressing (OT): Dependent Is this a change from baseline?: Pre-admission baseline Grooming: Dependent Is this a change from baseline?: Pre-admission baseline Feeding: Needs assistance Is this a change from baseline?: Pre-admission baseline Bathing: Dependent Is this a change from baseline?: Pre-admission baseline Toileting: Dependent Is this a change from baseline?: Pre-admission baseline In/Out Bed: Dependent Is this a change from baseline?: Pre-admission baseline Walks in Home: Dependent Does the patient have difficulty walking or climbing stairs?: Yes Weakness of Legs: Both Weakness of Arms/Hands: Both  Permission Sought/Granted Permission sought to share information with : Other (comment) Permission granted to share information with : Yes, Verbal Permission Granted Permission granted to share info w AGENCY: HH agenices  Emotional Assessment Orientation: : Oriented to Self, Oriented to Place, Oriented to  Time, Oriented to Situation Alcohol / Substance Use: Not Applicable Psych Involvement: No (comment)  Admission diagnosis:  Candida rash of groin [B37.89] UTI (urinary tract infection) due to urinary indwelling Foley catheter [M25.003B, N39.0] Urinary tract infection without hematuria, site unspecified [N39.0] Pressure injury of  right buttock, stage 1 [L89.311] Patient Active Problem List   Diagnosis Date Noted  Pressure injury of skin 04/27/2022   UTI (urinary tract infection) due to urinary indwelling Foley catheter 04/26/2022   Cellulitis, ruled out 03/17/2022   Weakness 03/16/2022   UTI (urinary tract infection) 03/15/2022   Debility 03/15/2022   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 02/13/2022   Lower extremity weakness 02/10/2022   Fracture of right proximal fibula 11/27/2021   T12 compression fracture 11/27/2021   Effusion of bursa of left knee 11/27/2021   Fall 11/26/2021   Diastolic dysfunction 08/05/2021   Hemangioma of skin and subcutaneous tissue 02/19/2021   History of malignant neoplasm of skin 02/19/2021   Intertrigo 02/19/2021   Other seborrheic keratosis 02/19/2021   Atrial fibrillation 02/16/2021   Atrial fibrillation with RVR 02/15/2021   Degeneration of lumbar intervertebral disc 10/02/2020   Low back pain 10/02/2020   Personal history of colonic polyps 08/21/2020   Other specified symptoms and signs involving the circulatory and respiratory systems 08/21/2020   Other abnormalities of gait and mobility 08/21/2020   Anxiety and depression 08/21/2020   Chronic kidney disease (CKD), stage III (moderate) 08/21/2020   Osteoporosis 08/21/2020   Abnormal digestive tract function 08/21/2020   Chronic pain of right knee 08/15/2019   Vertebral fracture, pathological    Hypothyroidism (acquired)    Personal history of radiation therapy    Breast CA    Genetic testing 10/22/2016   Sensorineural hearing loss (SNHL), bilateral 07/19/2015   Anxiety disorder 07/19/2015   Sclerosing adenosis of left breast 11/15/2014   Bruit 08/03/2013   Family history of malignant neoplasm of breast 07/28/2013   Neoplasm of right breast, primary tumor staging category Tis: ductal carcinoma in situ (DCIS) 10/20/2012   Obesities, morbid    HTN (hypertension)     Hyperlipidemia    CAD (coronary artery disease) 07/13/1998   PCP:  [name], NP Pharmacy:   [facility] [place] [place] - [address] [place] [address] Phone: [phone] Fax: [phone]  Social Determinants of Health (SDOH) Social History: SDOH Screenings   Food Insecurity: No Food Insecurity (03/15/2022)  Housing: Low Risk  (03/15/2022)  Transportation Needs: No Transportation Needs (03/15/2022)  Utilities: Not At Risk (03/15/2022)  Alcohol Screen: Low Risk  (02/23/2022)  Depression (PHQ2-9): Low Risk  (02/23/2022)  Financial Resource Strain: Low Risk  (02/23/2022)  Physical Activity: Inactive (02/23/2022)  Social Connections: Socially Isolated (02/23/2022)  Stress: Stress Concern Present (02/23/2022)  Tobacco Use: Medium Risk (04/26/2022)   SDOH Interventions:    Readmission Risk Interventions    11/27/2021    1:51 PM  Readmission Risk Prevention Plan  Transportation Screening Complete  PCP or Specialist Appt within 5-7 Days Complete  Home Care Screening Complete  Medication Review (RN CM) Complete

## 2022-04-27 NOTE — Progress Notes (Signed)
PROGRESS NOTE    Carmen Haney  ZOX:[id number] DOB: January 10, 1931 DOA: 04/26/2022 PCP: [name], NP   Brief Narrative:  89 y.o. female with medical history significant for stiff person syndrome, paroxysmal atrial fibrillation, hypertension, hyperlipidemia and chronic diastolic congestive heart failure with chronic Foley catheter, be admitted to the hospital with catheter associated UTI.    Assessment & Plan:  Principal Problem:   UTI (urinary tract infection) due to urinary indwelling Foley catheter Active Problems:   HTN (hypertension)   Hyperlipidemia   Hypothyroidism (acquired)   Chronic kidney disease (CKD), stage III (moderate)   Anxiety disorder   Atrial fibrillation   Pressure injury of skin    Catheter associated urinary tract infection-not meeting sepsis criteria -Patient has had chronic Foley catheter in for at least past 2 months, last time changed about 2 weeks ago.  Now coming in with urinary tract infection, growing E. coli.  I suspect this is a chronic colonizer. Urinary retention continues to be an issue for her given her underlying neurologic condition of stiff person syndrome.  I spoke with Dr. Arita Miss from urology who agrees that we will get IR involved for suprapubic catheter placement.  Stiff person syndrome Bilateral lower extremity weakness Spastic back - Follows outpatient neurology.  Plan is to continue patient on baclofen which can be uptitrated.  She can receive Valium as needed     HTN (hypertension) -IV as needed    Hypothyroidism (acquired) -Synthroid    Chronic kidney disease (CKD), stage III (moderate) -Stable    Anxiety disorder -On as needed Valium    Atrial fibrillation, paroxysmal - On Eliquis    DVT prophylaxis: Eliquis Code Status: Full code Family Communication: Daughter at bedside Seen and examined at bedside.  Continue hospital stay.   Pressure Injury 04/26/22 Buttocks Left Stage 1 -  Intact skin with non-blanchable  redness of a localized area usually over a bony prominence. blister/denuding area L buttock (Active)  04/26/22 1752  Location: Buttocks  Location Orientation: Left  Staging: Stage 1 -  Intact skin with non-blanchable redness of a localized area usually over a bony prominence.  Wound Description (Comments): blister/denuding area L buttock  Present on Admission: Yes     Pressure Injury 04/26/22 Heel Left;Posterior Stage 1 -  Intact skin with non-blanchable redness of a localized area usually over a bony prominence. nonblanchable area L heel (Active)  04/26/22 1753  Location: Heel  Location Orientation: Left;Posterior  Staging: Stage 1 -  Intact skin with non-blanchable redness of a localized area usually over a bony prominence.  Wound Description (Comments): nonblanchable area L heel  Present on Admission: Yes     Diet Orders (From admission, onward)     Start     Ordered   04/26/22 1645  Diet regular Room service appropriate? No; Fluid consistency: Thin  Diet effective now       Question Answer Comment  Room service appropriate? No   Fluid consistency: Thin      04/26/22 1645            Subjective: Seen and examined at bedside.  Does not have any new complaints   Examination:  General exam: Appears calm and comfortable  Respiratory system: Clear to auscultation. Respiratory effort normal. Cardiovascular system: S1 & S2 heard, RRR. No JVD, murmurs, rubs, gallops or clicks. No pedal edema. Gastrointestinal system: Abdomen is nondistended, soft and nontender. No organomegaly or masses felt. Normal bowel sounds heard. Central nervous system: Alert and oriented.  No focal neurological deficits. Extremities: Symmetric 5 x 5 power. Skin: No rashes, lesions or ulcers Psychiatry: Judgement and insight appear normal. Mood & affect appropriate.  Foley catheter in place, changed in the ER  Objective: Vitals:   04/26/22 2129 04/27/22 0023 04/27/22 0402 04/27/22 0912  BP: (!)  140/59 131/63 (!) 116/54 (!) 106/54  Pulse: 76 75 74 84  Resp: 18 18 18 20   Temp: 98.3 F (36.8 C) 98.2 F (36.8 C) (!) 97.4 F (36.3 C) 97.9 F (36.6 C)  TempSrc: Oral Oral Oral Oral  SpO2: 95% 93% 93% 98%  Weight:      Height:        Intake/Output Summary (Last 24 hours) at 04/27/2022 1250 Last data filed at 04/27/2022 0758 Gross per 24 hour  Intake 2010.63 ml  Output 1300 ml  Net 710.63 ml   Filed Weights   04/26/22 1700  Weight: 90.7 kg    Scheduled Meds:  acidophilus  1 capsule Oral Daily   apixaban  5 mg Oral BID   baclofen  5 mg Oral TID   brexpiprazole  0.5 mg Oral Daily   Chlorhexidine Gluconate Cloth  6 each Topical Daily   docusate sodium  100 mg Oral BID   DULoxetine  40 mg Oral QHS   levothyroxine  150 mcg Oral QAC breakfast   nystatin   Topical BID   polyethylene glycol  17 g Oral Daily   Continuous Infusions:  sodium chloride 100 mL/hr at 04/27/22 0551   cefTRIAXone (ROCEPHIN)  IV 1 g (04/27/22 7948)    Nutritional status     Body mass index is 36.57 kg/m.  Data Reviewed:   CBC: Recent Labs  Lab 04/26/22 0756 04/27/22 0454  WBC 8.7 5.7  NEUTROABS 6.9  --   HGB 12.9 10.8*  HCT 40.6 33.7*  MCV 94.0 94.4  PLT 319 255   Basic Metabolic Panel: Recent Labs  Lab 04/26/22 0756 04/27/22 0454  NA 132* 133*  K 3.5 3.5  CL 93* 98  CO2 29 27  GLUCOSE 110* 103*  BUN 9 7*  CREATININE 0.61 0.49  CALCIUM 8.7* 8.1*   GFR: Estimated Creatinine Clearance: 49.9 mL/min (by C-G formula based on SCr of 0.49 mg/dL). Liver Function Tests: Recent Labs  Lab 04/26/22 0756  AST 13*  ALT 8  ALKPHOS 68  BILITOT 0.6  PROT 6.7  ALBUMIN 3.3*   Recent Labs  Lab 04/26/22 0756  LIPASE 27   No results for input(s): "AMMONIA" in the last 168 hours. Coagulation Profile: No results for input(s): "INR", "PROTIME" in the last 168 hours. Cardiac Enzymes: No results for input(s): "CKTOTAL", "CKMB", "CKMBINDEX", "TROPONINI" in the last 168 hours. BNP  (last 3 results) No results for input(s): "PROBNP" in the last 8760 hours. HbA1C: No results for input(s): "HGBA1C" in the last 72 hours. CBG: No results for input(s): "GLUCAP" in the last 168 hours. Lipid Profile: No results for input(s): "CHOL", "HDL", "LDLCALC", "TRIG", "CHOLHDL", "LDLDIRECT" in the last 72 hours. Thyroid Function Tests: No results for input(s): "TSH", "T4TOTAL", "FREET4", "T3FREE", "THYROIDAB" in the last 72 hours. Anemia Panel: No results for input(s): "VITAMINB12", "FOLATE", "FERRITIN", "TIBC", "IRON", "RETICCTPCT" in the last 72 hours. Sepsis Labs: Recent Labs  Lab 04/26/22 2015  LATICACIDVEN 0.9    Recent Results (from the past 240 hour(s))  Blood Culture (routine x 2)     Status: None   Collection Time: 04/18/22 11:00 AM   Specimen: Right Antecubital; Blood  Result Value  Ref Range Status   Specimen Description RIGHT ANTECUBITAL BLOOD  Final   Special Requests   Final    Blood Culture results may not be optimal due to an excessive volume of blood received in culture bottles BOTTLES DRAWN AEROBIC AND ANAEROBIC   Culture   Final    NO GROWTH 5 DAYS Performed at [facility], [address] [place], [place] [address]    Report Status 04/23/2022 FINAL  Final  Remove urinary catheter to obtain Clean Catch urine culture     Status: Abnormal   Collection Time: 04/18/22 11:10 AM   Specimen: Urine, Clean Catch  Result Value Ref Range Status   Specimen Description   Final    URINE, CLEAN CATCH Performed at [facility], [address] [place], [place] [address]    Special Requests   Final    NONE Performed at [facility], [address] [place], [place] [address]    Culture MULTIPLE SPECIES PRESENT, SUGGEST RECOLLECTION (A)  Final   Report Status 04/19/2022 FINAL  Final  Urine Culture     Status: Abnormal (Preliminary result)   Collection Time: 04/26/22  7:13 AM   Specimen: Urine, Catheterized  Result  Value Ref Range Status   Specimen Description   Final    URINE, CATHETERIZED Performed at [facility], [address], [place], [place] [address]    Special Requests   Final    NONE Performed at [facility], [address], [place], [place] [address]    Culture (A)  Final    >=100,000 COLONIES/mL ESCHERICHIA COLI SUSCEPTIBILITIES TO FOLLOW Performed at [facility], [address], [place], [place] [address]    Report Status PENDING  Incomplete  Blood culture (routine x 2)     Status: None (Preliminary result)   Collection Time: 04/26/22  2:31 PM   Specimen: BLOOD RIGHT FOREARM  Result Value Ref Range Status   Specimen Description   Final    BLOOD RIGHT FOREARM Performed at [facility], [address] [place], [place] [address]    Special Requests   Final    BOTTLES DRAWN AEROBIC AND ANAEROBIC Blood Culture results may not be optimal due to an excessive volume of blood received in culture bottles Performed at [facility], [address], [place], [place] [address]    Culture   Final    NO GROWTH < 12 HOURS Performed at [facility], [address] [place], [place] [address]    Report Status PENDING  Incomplete  Blood culture (routine x 2)     Status: None (Preliminary result)   Collection Time: 04/26/22  3:07 PM   Specimen: BLOOD  Result Value Ref Range Status   Specimen Description   Final    BLOOD RIGHT ANTECUBITAL Performed at [facility], [address] [place], [place] [address]    Special Requests   Final    BOTTLES DRAWN AEROBIC AND ANAEROBIC Blood Culture results may not be optimal due to an inadequate volume of blood received in culture bottles Performed at [facility], [address] [place], [place] [address]    Culture   Final    NO GROWTH < 12 HOURS Performed at [facility], [address], [place], [place] [address]    Report  Status PENDING  Incomplete         Radiology Studies:  CT ABDOMEN PELVIS W CONTRAST  Result Date: 04/26/2022 CLINICAL DATA:  Abdominal pain. Persistent rectal and vaginal pain since last visit. Chronic constipation. EXAM: CT ABDOMEN AND PELVIS WITH CONTRAST TECHNIQUE: Multidetector CT imaging of the abdomen and pelvis was performed using the standard protocol following bolus administration of intravenous contrast. RADIATION DOSE REDUCTION: This exam was performed according to the departmental dose-optimization program which includes automated exposure control, adjustment of the mA and/or kV according to patient size and/or use of iterative reconstruction technique. CONTRAST:  OMNIPAQUE IOHEXOL 300 MG/ML  SOLN COMPARISON:  02/11/2022 FINDINGS: Lower chest: No pleural fluid or airspace consolidation. Mild scarring identified within the visualized lung bases. Hepatobiliary: No suspicious liver abnormality. Again seen are several small, less than 1 cm low-density liver lesions which likely represent cysts. The gallbladder appears normal. No CT visible stones, gallbladder wall inflammation, or bile duct dilatation. Pancreas: Unremarkable. No pancreatic ductal dilatation or surrounding inflammatory changes. Spleen: Normal in size without focal abnormality. Adrenals/Urinary Tract: Normal adrenal glands. Right parapelvic cyst is unchanged measuring 3 cm, image 32/2. Posterior cortex left kidney cyst is unchanged measuring 1.1 cm. Additional, less than 1 cm hypodense kidney lesions are noted which are technically too small to characterize compatible with benign Bosniak class 2 lesions. No follow-up imaging recommended. Upper pole right kidney stone measures 5 mm, image 62/4. No left kidney stones. No hydronephrosis identified bilaterally. The urinary bladder is partially decompressed around a Foley catheter. There is wall thickening and enhancement of the urinary bladder with surrounding soft tissue haziness.  The wall thickening may in part reflect incomplete distension. No focal bladder lesion identified Stomach/Bowel: Stomach is normal. No pathologic dilatation of the large or small bowel loops. No bowel wall thickening or inflammation. The appendix is visualized and appears normal. Vascular/Lymphatic: No aneurysm. Mild aortic atherosclerotic calcifications. No signs of abdominopelvic adenopathy. Reproductive: Aortic atherosclerosis. No enlarged abdominal or pelvic lymph nodes. Other: There is new presacral soft tissue edema. No free fluid or fluid collections identified. No signs of pneumoperitoneum. Musculoskeletal: No acute findings. Bones appear diffusely osteopenic. Stable compression deformity involving the T12 vertebra with loss of approximately 30% of the superior endplate height. Mild L3 central vertebral body endplate deformity is unchanged. IMPRESSION: 1. The urinary bladder is partially decompressed around a Foley catheter. There is wall thickening and enhancement of the urinary bladder with surrounding soft tissue haziness. The wall thickening may in part reflect incomplete distension. Correlate for any clinical signs or symptoms of cystitis. 2. New presacral soft tissue edema. Etiology is indeterminate. 3. Nonobstructing right renal calculus. 4. Stable T12 and L3 vertebral body compression deformities. 5.  Aortic Atherosclerosis (ICD10-I70.0). Electronically Signed   By: Signa Kell M.D.   On: 04/26/2022 09:53           LOS: 0 days   Time spent= 35 mins    Aariyana Manz Joline Maxcy, MD [facility]  If 7PM-7AM, please contact night-coverage  04/27/2022, 12:50 PM

## 2022-04-27 NOTE — Telephone Encounter (Signed)
[facility] is helping pt.

## 2022-04-28 DIAGNOSIS — E669 Obesity, unspecified: Secondary | ICD-10-CM | POA: Diagnosis present

## 2022-04-28 DIAGNOSIS — I130 Hypertensive heart and chronic kidney disease with heart failure and stage 1 through stage 4 chronic kidney disease, or unspecified chronic kidney disease: Secondary | ICD-10-CM | POA: Diagnosis present

## 2022-04-28 DIAGNOSIS — B372 Candidiasis of skin and nail: Secondary | ICD-10-CM | POA: Diagnosis present

## 2022-04-28 DIAGNOSIS — T83511A Infection and inflammatory reaction due to indwelling urethral catheter, initial encounter: Secondary | ICD-10-CM | POA: Diagnosis present

## 2022-04-28 DIAGNOSIS — L89311 Pressure ulcer of right buttock, stage 1: Secondary | ICD-10-CM | POA: Diagnosis present

## 2022-04-28 DIAGNOSIS — B9620 Unspecified Escherichia coli [E. coli] as the cause of diseases classified elsewhere: Secondary | ICD-10-CM | POA: Diagnosis present

## 2022-04-28 DIAGNOSIS — E039 Hypothyroidism, unspecified: Secondary | ICD-10-CM | POA: Diagnosis present

## 2022-04-28 DIAGNOSIS — N183 Chronic kidney disease, stage 3 unspecified: Secondary | ICD-10-CM | POA: Diagnosis present

## 2022-04-28 DIAGNOSIS — M810 Age-related osteoporosis without current pathological fracture: Secondary | ICD-10-CM | POA: Diagnosis present

## 2022-04-28 DIAGNOSIS — I5032 Chronic diastolic (congestive) heart failure: Secondary | ICD-10-CM | POA: Diagnosis present

## 2022-04-28 DIAGNOSIS — R000 Tachycardia, unspecified: Secondary | ICD-10-CM | POA: Diagnosis present

## 2022-04-28 DIAGNOSIS — Y846 Urinary catheterization as the cause of abnormal reaction of the patient, or of later complication, without mention of misadventure at the time of the procedure: Secondary | ICD-10-CM | POA: Diagnosis present

## 2022-04-28 DIAGNOSIS — N390 Urinary tract infection, site not specified: Secondary | ICD-10-CM | POA: Diagnosis present

## 2022-04-28 DIAGNOSIS — E785 Hyperlipidemia, unspecified: Secondary | ICD-10-CM | POA: Diagnosis present

## 2022-04-28 DIAGNOSIS — B3731 Acute candidiasis of vulva and vagina: Secondary | ICD-10-CM | POA: Diagnosis present

## 2022-04-28 DIAGNOSIS — E871 Hypo-osmolality and hyponatremia: Secondary | ICD-10-CM | POA: Diagnosis present

## 2022-04-28 DIAGNOSIS — H9190 Unspecified hearing loss, unspecified ear: Secondary | ICD-10-CM | POA: Diagnosis present

## 2022-04-28 DIAGNOSIS — N200 Calculus of kidney: Secondary | ICD-10-CM | POA: Diagnosis present

## 2022-04-28 DIAGNOSIS — I2510 Atherosclerotic heart disease of native coronary artery without angina pectoris: Secondary | ICD-10-CM | POA: Diagnosis present

## 2022-04-28 DIAGNOSIS — G2582 Stiff-man syndrome: Secondary | ICD-10-CM | POA: Diagnosis present

## 2022-04-28 DIAGNOSIS — Z79890 Hormone replacement therapy (postmenopausal): Secondary | ICD-10-CM | POA: Diagnosis not present

## 2022-04-28 DIAGNOSIS — Z7901 Long term (current) use of anticoagulants: Secondary | ICD-10-CM | POA: Diagnosis not present

## 2022-04-28 DIAGNOSIS — B3789 Other sites of candidiasis: Secondary | ICD-10-CM | POA: Diagnosis present

## 2022-04-28 DIAGNOSIS — L89621 Pressure ulcer of left heel, stage 1: Secondary | ICD-10-CM | POA: Diagnosis present

## 2022-04-28 DIAGNOSIS — F419 Anxiety disorder, unspecified: Secondary | ICD-10-CM | POA: Diagnosis present

## 2022-04-28 DIAGNOSIS — I480 Paroxysmal atrial fibrillation: Secondary | ICD-10-CM | POA: Diagnosis present

## 2022-04-28 LAB — URINE CULTURE: Culture: >=100000 — AB

## 2022-04-28 LAB — CULTURE, BLOOD (ROUTINE X 2)

## 2022-04-28 MED ORDER — APIXABAN 5 MG PO TABS
5.0000 mg | ORAL_TABLET | Freq: Two times a day (BID) | ORAL | Status: DC
  Administered 2022-04-28 – 2022-04-30 (×5): 5 mg via ORAL
  Filled 2022-04-28 (×5): qty 1

## 2022-04-28 MED ORDER — SODIUM CHLORIDE 0.9 % IV SOLN: INTRAVENOUS | Status: AC

## 2022-04-28 MED ORDER — SENNOSIDES-DOCUSATE SODIUM 8.6-50 MG PO TABS: 1.0000 | ORAL_TABLET | Freq: Every evening | ORAL | Status: DC | PRN

## 2022-04-28 NOTE — Progress Notes (Signed)
Physical Therapy Evaluation Patient Details Name: Carmen Haney MRN: [id number] DOB: [birth year] Today's Date: 04/28/2022  History of Present Illness  Patient is a 89 year old female who presented on 4/14 with catheter associated UTI. IR is being consulted for suprapubic placement. PMH: a fib, stiff person syndrome, breast cancer, hearing loss, morbid obesity, vertebral fracture.  Clinical Impression   Pt admitted with above diagnosis.  Pt currently with functional limitations due to the deficits listed below (see PT Problem List). Pt will benefit from acute skilled PT to increase their independence and safety with mobility to allow discharge.      The patient comes from ALF, per [facility] , patient will DC to  daughter's home.. patient  reports that she has  lift equipment and hospital bed. Per patient, She has been getting OOB via lift equipment by staff(sounds like a STEDY lift ).  Patient has not active movement in LE's, patient has ice packs on the lower legs for "Pain" associated with her syndrome, per patient. Patient did sit on bed edge , requiring total assistance of 2  persons. Once sitting, able to sit with min guard.     Recommendations for follow up therapy are one component of a multi-disciplinary discharge planning process, led by the attending physician.  Recommendations may be updated based on patient status, additional functional criteria and insurance authorization.  Follow Up Recommendations Can patient physically be transported by private vehicle: No     Assistance Recommended at Discharge    Patient can return home with the following  Two people to help with walking and/or transfers;Two people to help with bathing/dressing/bathroom;Assistance with cooking/housework;Assist for transportation;Help with stairs or ramp for entrance    Equipment Recommendations None recommended by PT  Recommendations for Other Services       Functional Status Assessment Patient has not had a  recent decline in their functional status     Precautions / Restrictions Precautions Precautions: Fall Precaution Comments: stiffman syndrome Restrictions Weight Bearing Restrictions: No      Mobility  Bed Mobility                    Transfers                        Ambulation/Gait                  Stairs            Wheelchair Mobility    Modified Rankin (Stroke Patients Only)       Balance Overall balance assessment: Needs assistance Sitting-balance support: Feet supported, Single extremity supported Sitting balance-Leahy Scale: Poor Sitting balance - Comments: was able to progress to min guard with BUE support sitting EOB                                     Pertinent Vitals/Pain Pain Assessment Faces Pain Scale: Hurts even more Pain Location: BLE Pain Descriptors / Indicators: Grimacing, Discomfort Pain Intervention(s): Monitored during session, Limited activity within patient's tolerance    Home Living Family/patient expects to be discharged to:: Private residence Living Arrangements: Other (Comment) (home with daughter and caregiver A) Available Help at Discharge: Family;Available 24 hours/day Type of Home: House Home Access: Ramped entrance       Home Layout: Two level;Able to live on main level with bedroom/bathroom Home Equipment: Rollator (4 wheels);BSC/3in1;Tub  bench;Toilet riser;Transport chair Additional Comments: pt with adjustable bed, STEDY, Hoyer (but it doesn't fit under her furniture)    Prior Function Prior Level of Function : Needs assist       Physical Assist : Mobility (physical)     Mobility Comments: pt was ambulating with rollator Nov 2023, since then has become progressively weaker, currently using STEDY to transfer at home and not amb in several weeks ADLs Comments: patient is able to complete UB ADL tasks seated with set up. patient is TD for LB tasks. patient has caregiver  support for medications and IADLs.     Hand Dominance   Dominant Hand: Right    Extremity/Trunk Assessment   Upper Extremity Assessment Upper Extremity Assessment: Overall WFL for tasks assessed (does report stiffness and soreness in  BUE with resuests "dot pull on them" at start of session.)    Lower Extremity Assessment Lower Extremity Assessment: Defer to PT evaluation    Cervical / Trunk Assessment Cervical / Trunk Assessment: Kyphotic  Communication   Communication: HOH  Cognition Arousal/Alertness: Awake/alert Behavior During Therapy: Anxious Overall Cognitive Status: History of cognitive impairments - at baseline                                 General Comments: patient was noted to have memory deficits during session with patient repeating herself. patient was anxious with movements. very particular about placement of items.        General Comments      Exercises     Assessment/Plan    PT Assessment    PT Problem List         PT Treatment Interventions Functional mobility training;Therapeutic activities;Therapeutic exercise;Balance training;Patient/family education;DME instruction    PT Goals (Current goals can be found in the Care Plan section)  Acute Rehab PT Goals Patient Stated Goal: to be able to transfer to a chair, tolerate sitting edge of bed PT Goal Formulation: With patient Time For Goal Achievement: 05/12/22 Potential to Achieve Goals: Fair    Frequency Min 1X/week     Co-evaluation PT/OT/SLP Co-Evaluation/Treatment: Yes Reason for Co-Treatment: To address functional/ADL transfers;For patient/therapist safety;Complexity of the patient's impairments (multi-system involvement) PT goals addressed during session: Mobility/safety with mobility;Balance OT goals addressed during session: ADL's and self-care       AM-PAC PT "6 Clicks" Mobility  Outcome Measure Help needed turning from your back to your side while in a flat bed  without using bedrails?: A Lot Help needed moving from lying on your back to sitting on the side of a flat bed without using bedrails?: Total Help needed moving to and from a bed to a chair (including a wheelchair)?: Total Help needed standing up from a chair using your arms (e.g., wheelchair or bedside chair)?: Total Help needed to walk in hospital room?: Total Help needed climbing 3-5 steps with a railing? : Total 6 Click Score: 7    End of Session Equipment Utilized During Treatment: Gait belt Activity Tolerance: Patient tolerated treatment well Patient left: in bed;with call bell/phone within reach;with bed alarm set Nurse Communication: Mobility status;Need for lift equipment PT Visit Diagnosis: Muscle weakness (generalized) (M62.81);Pain;Difficulty in walking, not elsewhere classified (R26.2) Pain - part of body: Leg    Time: 1127-1205 PT Time Calculation (min) (ACUTE ONLY): 38 min   Charges:   PT Evaluation $PT Eval Low Complexity: 1 Low  Blanchard Kelch PT Acute Rehabilitation Services Office [phone] Weekend [phone]   Rada Hay 04/28/2022, 1:30 PM

## 2022-04-28 NOTE — Progress Notes (Addendum)
PROGRESS NOTE    Carmen Haney  BJY:[id number] DOB: March 20, 1930 DOA: 04/26/2022 PCP: [name], NP   Brief Narrative:  89 y.o. female with medical history significant for stiff person syndrome, paroxysmal atrial fibrillation, hypertension, hyperlipidemia and chronic diastolic congestive heart failure with chronic Foley catheter, be admitted to the hospital with catheter associated UTI.  Due to the recurring issue, I discussed case with Dr. Arita Miss from urology who recommended IR consultation for suprapubic catheter placement.  IR consulted.   Assessment & Plan:  Principal Problem:   UTI (urinary tract infection) due to urinary indwelling Foley catheter Active Problems:   HTN (hypertension)   Hyperlipidemia   Hypothyroidism (acquired)   Chronic kidney disease (CKD), stage III (moderate)   Anxiety disorder   Atrial fibrillation   Pressure injury of skin    Catheter associated urinary tract infection-not meeting sepsis criteria -Patient has had chronic Foley catheter in for at least past 2 months, last time changed about 2 weeks ago.  Cultures growing E. coli which I suspect chronic colonizer Urinary retention continues to be an issue for her given her underlying neurologic condition of stiff person syndrome.  I spoke with Dr. Arita Miss from urology who agrees that we will get IR involved for suprapubic catheter placement.  IR consulted, but patient declined the catheter placement  Stiff person syndrome Bilateral lower extremity weakness Spastic back - Follows outpatient neurology; Dr Delane Ginger.  Plan is to manage her symptoms with baclofen and slowly titrate off Valium    HTN (hypertension) -IV as needed    Hypothyroidism (acquired) -Synthroid    Chronic kidney disease (CKD), stage III (moderate) -Stable  Anxiety disorder -On as needed Valium   Atrial fibrillation, paroxysmal -On Eliquis resumed  DVT prophylaxis: Eliquis Code Status: Full code Family Communication: [name]  updated Plans to work with PT/OT today.   Pressure Injury 04/26/22 Buttocks Left Stage 1 -  Intact skin with non-blanchable redness of a localized area usually over a bony prominence. blister/denuding area L buttock (Active)  04/26/22 1752  Location: Buttocks  Location Orientation: Left  Staging: Stage 1 -  Intact skin with non-blanchable redness of a localized area usually over a bony prominence.  Wound Description (Comments): blister/denuding area L buttock  Present on Admission: Yes     Pressure Injury 04/26/22 Heel Left;Posterior Stage 1 -  Intact skin with non-blanchable redness of a localized area usually over a bony prominence. nonblanchable area L heel (Active)  04/26/22 1753  Location: Heel  Location Orientation: Left;Posterior  Staging: Stage 1 -  Intact skin with non-blanchable redness of a localized area usually over a bony prominence.  Wound Description (Comments): nonblanchable area L heel  Present on Admission: Yes     Diet Orders (From admission, onward)     Start     Ordered   04/28/22 0742  Diet regular Room service appropriate? Yes; Fluid consistency: Thin  Diet effective now       Question Answer Comment  Room service appropriate? Yes   Fluid consistency: Thin      04/28/22 0741            Subjective: Feels better, little stiff.  Doesn't want Suprapubc catheter.   Examination: Constitutional: Not in acute distress; overall feels stiff Respiratory: Clear to auscultation bilaterally Cardiovascular: Normal sinus rhythm, no rubs Abdomen: Nontender nondistended good bowel sounds Musculoskeletal: No edema noted Skin: No rashes seen Neurologic: CN 2-12 grossly intact.  And nonfocal Psychiatric: Normal judgment and insight. Alert and  oriented x 3. Normal mood.   Foley catheter in place, changed in the ER  Objective: Vitals:   04/27/22 0912 04/27/22 1308 04/27/22 2012 04/28/22 0443  BP: (!) 106/54 (!) 104/55 124/62 (!) 147/86  Pulse: 84 73 78 69   Resp: 20 16 18 18   Temp: 97.9 F (36.6 C) 98.5 F (36.9 C) 98.2 F (36.8 C) 98.9 F (37.2 C)  TempSrc: Oral Oral Oral Oral  SpO2: 98% 94% 95% 100%  Weight:      Height:        Intake/Output Summary (Last 24 hours) at 04/28/2022 1153 Last data filed at 04/28/2022 0958 Gross per 24 hour  Intake 2619.37 ml  Output 3080 ml  Net -460.63 ml   Filed Weights   04/26/22 1700  Weight: 90.7 kg    Scheduled Meds:  acidophilus  1 capsule Oral Daily   apixaban  5 mg Oral BID   baclofen  5 mg Oral TID   brexpiprazole  0.5 mg Oral Daily   Chlorhexidine Gluconate Cloth  6 each Topical Daily   docusate sodium  100 mg Oral BID   DULoxetine  40 mg Oral QHS   levothyroxine  150 mcg Oral QAC breakfast   nystatin   Topical BID   polyethylene glycol  17 g Oral Daily   Continuous Infusions:  sodium chloride 100 mL/hr at 04/28/22 0743   cefTRIAXone (ROCEPHIN)  IV 1 g (04/28/22 0845)    Nutritional status     Body mass index is 36.57 kg/m.  Data Reviewed:   CBC: Recent Labs  Lab 04/26/22 0756 04/27/22 0454  WBC 8.7 5.7  NEUTROABS 6.9  --   HGB 12.9 10.8*  HCT 40.6 33.7*  MCV 94.0 94.4  PLT 319 255   Basic Metabolic Panel: Recent Labs  Lab 04/26/22 0756 04/27/22 0454  NA 132* 133*  K 3.5 3.5  CL 93* 98  CO2 29 27  GLUCOSE 110* 103*  BUN 9 7*  CREATININE 0.61 0.49  CALCIUM 8.7* 8.1*   GFR: Estimated Creatinine Clearance: 49.9 mL/min (by C-G formula based on SCr of 0.49 mg/dL). Liver Function Tests: Recent Labs  Lab 04/26/22 0756  AST 13*  ALT 8  ALKPHOS 68  BILITOT 0.6  PROT 6.7  ALBUMIN 3.3*   Recent Labs  Lab 04/26/22 0756  LIPASE 27   No results for input(s): "AMMONIA" in the last 168 hours. Coagulation Profile: No results for input(s): "INR", "PROTIME" in the last 168 hours. Cardiac Enzymes: No results for input(s): "CKTOTAL", "CKMB", "CKMBINDEX", "TROPONINI" in the last 168 hours. BNP (last 3 results) No results for input(s): "PROBNP" in the  last 8760 hours. HbA1C: No results for input(s): "HGBA1C" in the last 72 hours. CBG: No results for input(s): "GLUCAP" in the last 168 hours. Lipid Profile: No results for input(s): "CHOL", "HDL", "LDLCALC", "TRIG", "CHOLHDL", "LDLDIRECT" in the last 72 hours. Thyroid Function Tests: No results for input(s): "TSH", "T4TOTAL", "FREET4", "T3FREE", "THYROIDAB" in the last 72 hours. Anemia Panel: No results for input(s): "VITAMINB12", "FOLATE", "FERRITIN", "TIBC", "IRON", "RETICCTPCT" in the last 72 hours. Sepsis Labs: Recent Labs  Lab 04/26/22 2015  LATICACIDVEN 0.9    Recent Results (from the past 240 hour(s))  Urine Culture     Status: Abnormal   Collection Time: 04/26/22  7:13 AM   Specimen: Urine, Catheterized  Result Value Ref Range Status   Specimen Description   Final    URINE, CATHETERIZED Performed at [facility], [address] [place], [place] [address]    Special Requests   Final    NONE Performed at [facility], [address], [place], [place] [address]    Culture >=100,000 COLONIES/mL ESCHERICHIA COLI (A)  Final   Report Status 04/28/2022 FINAL  Final   Organism ID, Bacteria ESCHERICHIA COLI (A)  Final      Susceptibility   Escherichia coli - MIC*    AMPICILLIN >=32 RESISTANT Resistant     CEFAZOLIN >=64 RESISTANT Resistant     CEFEPIME <=0.12 SENSITIVE Sensitive     CEFTRIAXONE <=0.25 SENSITIVE Sensitive     CIPROFLOXACIN <=0.25 SENSITIVE Sensitive     GENTAMICIN <=1 SENSITIVE Sensitive     IMIPENEM <=0.25 SENSITIVE Sensitive     NITROFURANTOIN <=16 SENSITIVE Sensitive     TRIMETH/SULFA <=20 SENSITIVE Sensitive     AMPICILLIN/SULBACTAM >=32 RESISTANT Resistant     PIP/TAZO 8 SENSITIVE Sensitive     * >=100,000 COLONIES/mL ESCHERICHIA COLI  Blood culture (routine x 2)     Status: None (Preliminary result)   Collection Time: 04/26/22  2:31 PM   Specimen: BLOOD RIGHT FOREARM  Result Value Ref Range Status    Specimen Description   Final    BLOOD RIGHT FOREARM Performed at [facility], [address], [place], [place] [address]    Special Requests   Final    BOTTLES DRAWN AEROBIC AND ANAEROBIC Blood Culture results may not be optimal due to an excessive volume of blood received in culture bottles Performed at [facility], [address] [place], [place] [address]    Culture   Final    NO GROWTH 2 DAYS Performed at [facility], [address] [place], [place] [address]    Report Status PENDING  Incomplete  Blood culture (routine x 2)     Status: None (Preliminary result)   Collection Time: 04/26/22  3:07 PM   Specimen: BLOOD  Result Value Ref Range Status   Specimen Description   Final    BLOOD RIGHT ANTECUBITAL Performed at [facility], [address], [place], [place] [address]    Special Requests   Final    BOTTLES DRAWN AEROBIC AND ANAEROBIC Blood Culture results may not be optimal due to an inadequate volume of blood received in culture bottles Performed at [facility], [address] [place], [place] [address]    Culture   Final    NO GROWTH 2 DAYS Performed at [facility], [address] [place], [place] [address]    Report Status PENDING  Incomplete         Radiology Studies: No results found.         LOS: 0 days   Time spent= 35 mins    Kien Mirsky Joline Maxcy, MD [facility]  If 7PM-7AM, please contact night-coverage  04/28/2022, 11:53 AM

## 2022-04-28 NOTE — TOC Progression Note (Signed)
Transition of Care [facility]) - Progression Note   Patient Details  Name: Carmen Haney MRN: [id number] Date of Birth: 10-Apr-1930  Transition of [facility]) CM/SW Contact  Ewing Schlein, LCSW Phone Number: 04/28/2022, 3:10 PM  Clinical Narrative: PT evaluation recommended HH. CSW followed up with patient's daughter, [name], after meeting with the patient to discuss HH. Daughter is in agreement with [facility] and reported patient's neurologist, Dr. Loleta Chance, sent [facility] orders to [facility] prior to admission for PT, OT, and possible nursing. Daughter stated the patient will also need an aide at home. Daughter reported family is moving the patient's hospital bed from the ALF back to her home in the next 1-2 days. CSW spoke with Tresa Endo with [facility] and confirmed the agency can provide PT, OT, nursing, and aide. HH orders placed.    Expected Discharge Plan: Home w Home Health Services Barriers to Discharge: Continued Medical Work up  Expected Discharge Plan and Services In-house Referral: Clinical Social Work Living arrangements for the past 2 months: Assisted Living Facility           DME Arranged: N/A DME Agency: NA HH Arranged: RN, PT, OT, Nurse's Aide HH Agency: [facility] Date HH Agency Contacted: 04/28/22 Representative spoke with at [facility] Agency: Tresa Endo  Social Determinants of Health (SDOH) Interventions SDOH Screenings   Food Insecurity: No Food Insecurity (03/15/2022)  Housing: Low Risk  (03/15/2022)  Transportation Needs: No Transportation Needs (03/15/2022)  Utilities: Not At Risk (03/15/2022)  Alcohol Screen: Low Risk  (02/23/2022)  Depression (PHQ2-9): Low Risk  (02/23/2022)  Financial Resource Strain: Low Risk  (02/23/2022)  Physical Activity: Inactive (02/23/2022)  Social Connections: Socially Isolated (02/23/2022)  Stress: Stress Concern Present (02/23/2022)  Tobacco Use: Medium Risk (04/26/2022)   Readmission Risk Interventions    11/27/2021    1:51 PM  Readmission Risk Prevention  Plan  Transportation Screening Complete  PCP or Specialist Appt within 5-7 Days Complete  Home Care Screening Complete  Medication Review (RN CM) Complete

## 2022-04-28 NOTE — Evaluation (Signed)
Occupational Therapy Evaluation Patient Details Name: Carmen Haney MRN: [id number] DOB: 04/25/1930 Today's Date: 04/28/2022   History of Present Illness Patient is a 89 year old female who presented on 4/14 with catheter associated UTI. IR is being consulted for suprapubic placement. PMH: a fib, stiff person syndrome, breast cancer, hearing loss, morbid obesity, vertebral fracture.   Clinical Impression   Patient evaluated by Occupational Therapy with no further acute OT needs identified. All education has been completed and the patient has no further questions. Patient appears to be at baseline from ADL standpoint set up for UB and TD for LB.  See below for any follow-up Occupational Therapy or equipment needs. OT is signing off. Thank you for this referral.       Recommendations for follow up therapy are one component of a multi-disciplinary discharge planning process, led by the attending physician.  Recommendations may be updated based on patient status, additional functional criteria and insurance authorization.   Assistance Recommended at Discharge Frequent or constant Supervision/Assistance  Patient can return home with the following Two people to help with bathing/dressing/bathroom;Assist for transportation;Assistance with cooking/housework;Help with stairs or ramp for entrance;Two people to help with walking and/or transfers;Direct supervision/assist for financial management;Direct supervision/assist for medications management    Functional Status Assessment  Patient has not had a recent decline in their functional status  Equipment Recommendations  None recommended by OT       Precautions / Restrictions Precautions Precautions: Fall Precaution Comments: stiffman syndrome Restrictions Weight Bearing Restrictions: No      Mobility Bed Mobility Overal bed mobility: Needs Assistance Bed Mobility: Supine to Sit, Sit to Supine     Supine to sit: Total assist, +2 for  physical assistance Sit to supine: Total assist, +2 for physical assistance   General bed mobility comments: bed pad used to raise trunk and pivot hips to edge of bed;           Balance Overall balance assessment: Needs assistance Sitting-balance support: Feet supported, Single extremity supported Sitting balance-Leahy Scale: Poor Sitting balance - Comments: was able to progress to min guard with BUE support sitting EOB           ADL either performed or assessed with clinical judgement   ADL Overall ADL's : Needs assistance/impaired Eating/Feeding: Set up;Bed level   Grooming: Bed level;Set up   Upper Body Bathing: Bed level;Set up   Lower Body Bathing: Bed level;Total assistance   Upper Body Dressing : Bed level;Minimal assistance;Min guard   Lower Body Dressing: Bed level;Total assistance     Toilet Transfer Details (indicate cue type and reason): patient is +2 to transition to EOB with patient unable to move LB. patient inquired about using slide board for transfers. patient noted to have extremely tight grasp on bed while sitting EOB. patient was educated that she would need to be comfortable/able to sit without holding on to maintain trunk balance. patient verbalzied understanding but unable to process this education. patietn physically unable to scoot herself on EOB. patient reported using sit to stand lift at ALF "with people who are trained right" unable to use STEDY on this date. Toileting- Clothing Manipulation and Hygiene: Bed level;Total assistance       Functional mobility during ADLs: Total assistance;+2 for physical assistance       Vision Patient Visual Report: No change from baseline              Pertinent Vitals/Pain Pain Assessment Pain Assessment: Faces Faces Pain Scale:  Hurts little more Pain Location: BLE Pain Descriptors / Indicators: Grimacing, Discomfort Pain Intervention(s): Limited activity within patient's tolerance, Monitored during  session, Repositioned     Hand Dominance Right   Extremity/Trunk Assessment Upper Extremity Assessment Upper Extremity Assessment: Overall WFL for tasks assessed (does report stiffness and soreness in  BUE with resuests "dot pull on them" at start of session.)   Lower Extremity Assessment Lower Extremity Assessment: Defer to PT evaluation   Cervical / Trunk Assessment Cervical / Trunk Assessment: Kyphotic   Communication Communication Communication: HOH   Cognition Arousal/Alertness: Awake/alert Behavior During Therapy: Anxious Overall Cognitive Status: History of cognitive impairments - at baseline       General Comments: patient was noted to have memory deficits during session with patient repeating herself. patient was anxious with movements. very particular about placement of items.                Home Living Family/patient expects to be discharged to:: Private residence Living Arrangements: Other (Comment) (home with daughter and caregiver A) Available Help at Discharge: Family;Available 24 hours/day Type of Home: House Home Access: Ramped entrance     Home Layout: Two level;Able to live on main level with bedroom/bathroom     Bathroom Shower/Tub: Chief Strategy Officer: Standard     Home Equipment: Rollator (4 wheels);BSC/3in1;Tub bench;Toilet riser;Transport chair   Additional Comments: pt with adjustable bed, STEDY, Hoyer (but it doesn't fit under her furniture)      Prior Functioning/Environment Prior Level of Function : Needs assist       Physical Assist : Mobility (physical)     Mobility Comments: pt was ambulating with rollator Nov 2023, since then has become progressively weaker, currently using STEDY to transfer at home and not amb in several weeks ADLs Comments: patient is able to complete UB ADL tasks seated with set up. patient is TD for LB tasks. patient has caregiver support for medications and IADLs.        OT Problem  List: Decreased strength;Decreased activity tolerance;Decreased safety awareness;Impaired balance (sitting and/or standing);Decreased knowledge of use of DME or AE;Obesity      OT Treatment/Interventions:      OT Goals(Current goals can be found in the care plan section) Acute Rehab OT Goals OT Goal Formulation: All assessment and education complete, DC therapy  OT Frequency:      Co-evaluation PT/OT/SLP Co-Evaluation/Treatment: Yes Reason for Co-Treatment: To address functional/ADL transfers;For patient/therapist safety;Complexity of the patient's impairments (multi-system involvement) PT goals addressed during session: Mobility/safety with mobility;Balance OT goals addressed during session: ADL's and self-care      AM-PAC OT "6 Clicks" Daily Activity     Outcome Measure Help from another person eating meals?: None Help from another person taking care of personal grooming?: A Little Help from another person toileting, which includes using toliet, bedpan, or urinal?: Total Help from another person bathing (including washing, rinsing, drying)?: Total Help from another person to put on and taking off regular upper body clothing?: A Little Help from another person to put on and taking off regular lower body clothing?: Total 6 Click Score: 13   End of Session Nurse Communication: Mobility status;Other (comment) (concerns over circulation in feet with patient continuously requesting ice packs)  Activity Tolerance: Patient tolerated treatment well Patient left: in bed;with call bell/phone within reach;with bed alarm set  OT Visit Diagnosis: Unsteadiness on feet (R26.81);Other abnormalities of gait and mobility (R26.89);Muscle weakness (generalized) (M62.81)  Time: 1127-1205 OT Time Calculation (min): 38 min Charges:  OT General Charges $OT Visit: 1 Visit OT Evaluation $OT Eval Moderate Complexity: 1 Mod  Masaichi Kracht OTR/L, MS Acute Rehabilitation Department Office#  [phone]   Selinda Flavin 04/28/2022, 1:30 PM

## 2022-04-29 DIAGNOSIS — N390 Urinary tract infection, site not specified: Secondary | ICD-10-CM | POA: Diagnosis not present

## 2022-04-29 DIAGNOSIS — T83511A Infection and inflammatory reaction due to indwelling urethral catheter, initial encounter: Secondary | ICD-10-CM | POA: Diagnosis not present

## 2022-04-29 LAB — CULTURE, BLOOD (ROUTINE X 2): Culture: NO GROWTH

## 2022-04-29 NOTE — Progress Notes (Signed)
PROGRESS NOTE    Carmen Haney  ZOX:[id number] DOB: September 08, 1930 DOA: 04/26/2022 PCP: [name], NP   Brief Narrative:  89 y.o. female with medical history significant for stiff person syndrome, paroxysmal atrial fibrillation, hypertension, hyperlipidemia and chronic diastolic congestive heart failure with chronic Foley catheter, be admitted to the hospital with catheter associated UTI.  Due to the recurring issue, I discussed case with Dr. Arita Miss from urology who recommended IR consultation for suprapubic catheter placement.  IR consulted but family and patient chose to decline suprapubic catheter placement during this admission.  Assessment & Plan:  Principal Problem:   UTI (urinary tract infection) due to urinary indwelling Foley catheter Active Problems:   HTN (hypertension)   Hyperlipidemia   Hypothyroidism (acquired)   Chronic kidney disease (CKD), stage III (moderate)   Anxiety disorder   Atrial fibrillation   UTI (urinary tract infection)   Pressure injury of skin    Catheter associated urinary tract infection-not meeting sepsis criteria Chronic Foley catheter -Patient has had chronic Foley catheter in for at least past 2 months, last time changed about 2 weeks ago.  Cultures growing E. coli, sensitive to Rocephin I discussed case with Dr. Arita Miss from urology who recommended IR consultation for suprapubic catheter placement.  Patient and family initially interested in suprapubic catheter placement but now declining and would like to think about this and readdress this in the future  Stiff person syndrome Bilateral lower extremity weakness Spastic back - Follows outpatient neurology; Dr Delane Ginger.  Plan is to manage her symptoms with baclofen and slowly titrate off Valium    HTN (hypertension) -IV as needed    Hypothyroidism (acquired) -Synthroid    Chronic kidney disease (CKD), stage III (moderate) -Stable  Anxiety disorder -On as needed Valium   Atrial fibrillation,  paroxysmal -On Eliquis resumed  DVT prophylaxis: Eliquis Code Status: Full code Family Communication: [name] updated Family making arrangements at home so she can be transition.  Tentative discharge plan/18  Pressure Injury 04/26/22 Buttocks Left Stage 1 -  Intact skin with non-blanchable redness of a localized area usually over a bony prominence. blister/denuding area L buttock (Active)  04/26/22 1752  Location: Buttocks  Location Orientation: Left  Staging: Stage 1 -  Intact skin with non-blanchable redness of a localized area usually over a bony prominence.  Wound Description (Comments): blister/denuding area L buttock  Present on Admission: Yes     Pressure Injury 04/26/22 Heel Left;Posterior Stage 1 -  Intact skin with non-blanchable redness of a localized area usually over a bony prominence. nonblanchable area L heel (Active)  04/26/22 1753  Location: Heel  Location Orientation: Left;Posterior  Staging: Stage 1 -  Intact skin with non-blanchable redness of a localized area usually over a bony prominence.  Wound Description (Comments): nonblanchable area L heel  Present on Admission: Yes     Diet Orders (From admission, onward)     Start     Ordered   04/28/22 0742  Diet regular Room service appropriate? Yes; Fluid consistency: Thin  Diet effective now       Question Answer Comment  Room service appropriate? Yes   Fluid consistency: Thin      04/28/22 0741            Subjective: Doing ok, no complaints.  At times feels stiff.   Examination: Constitutional: Not in acute distress Respiratory: Clear to auscultation bilaterally Cardiovascular: Normal sinus rhythm, no rubs Abdomen: Nontender nondistended good bowel sounds Musculoskeletal: No edema noted Skin:  No rashes seen Neurologic: CN 2-12 grossly intact.  And nonfocal Psychiatric: Normal judgment and insight. Alert and oriented x 3. Normal mood.    Foley catheter in place, changed in the  ER  Objective: Vitals:   04/28/22 0443 04/28/22 1251 04/28/22 2115 04/29/22 0545  BP: (!) 147/86 (!) 148/76 (!) 143/73 (!) 154/85  Pulse: 69 (!) 58 85 80  Resp: 18 17 16 18   Temp: 98.9 F (37.2 C) (!) 97.5 F (36.4 C) 97.9 F (36.6 C) 97.9 F (36.6 C)  TempSrc: Oral Oral Oral Oral  SpO2: 100% 96% 94% 96%  Weight:      Height:        Intake/Output Summary (Last 24 hours) at 04/29/2022 0748 Last data filed at 04/29/2022 0545 Gross per 24 hour  Intake 1748.33 ml  Output 3800 ml  Net -2051.67 ml   Filed Weights   04/26/22 1700  Weight: 90.7 kg    Scheduled Meds:  acidophilus  1 capsule Oral Daily   apixaban  5 mg Oral BID   baclofen  5 mg Oral TID   brexpiprazole  0.5 mg Oral Daily   Chlorhexidine Gluconate Cloth  6 each Topical Daily   docusate sodium  100 mg Oral BID   DULoxetine  40 mg Oral QHS   levothyroxine  150 mcg Oral QAC breakfast   nystatin   Topical BID   polyethylene glycol  17 g Oral Daily   Continuous Infusions:  sodium chloride 100 mL/hr at 04/28/22 1251   cefTRIAXone (ROCEPHIN)  IV 1 g (04/28/22 0845)    Nutritional status     Body mass index is 36.57 kg/m.  Data Reviewed:   CBC: Recent Labs  Lab 04/26/22 0756 04/27/22 0454  WBC 8.7 5.7  NEUTROABS 6.9  --   HGB 12.9 10.8*  HCT 40.6 33.7*  MCV 94.0 94.4  PLT 319 255   Basic Metabolic Panel: Recent Labs  Lab 04/26/22 0756 04/27/22 0454  NA 132* 133*  K 3.5 3.5  CL 93* 98  CO2 29 27  GLUCOSE 110* 103*  BUN 9 7*  CREATININE 0.61 0.49  CALCIUM 8.7* 8.1*   GFR: Estimated Creatinine Clearance: 49.9 mL/min (by C-G formula based on SCr of 0.49 mg/dL). Liver Function Tests: Recent Labs  Lab 04/26/22 0756  AST 13*  ALT 8  ALKPHOS 68  BILITOT 0.6  PROT 6.7  ALBUMIN 3.3*   Recent Labs  Lab 04/26/22 0756  LIPASE 27   No results for input(s): "AMMONIA" in the last 168 hours. Coagulation Profile: No results for input(s): "INR", "PROTIME" in the last 168 hours. Cardiac  Enzymes: No results for input(s): "CKTOTAL", "CKMB", "CKMBINDEX", "TROPONINI" in the last 168 hours. BNP (last 3 results) No results for input(s): "PROBNP" in the last 8760 hours. HbA1C: No results for input(s): "HGBA1C" in the last 72 hours. CBG: No results for input(s): "GLUCAP" in the last 168 hours. Lipid Profile: No results for input(s): "CHOL", "HDL", "LDLCALC", "TRIG", "CHOLHDL", "LDLDIRECT" in the last 72 hours. Thyroid Function Tests: No results for input(s): "TSH", "T4TOTAL", "FREET4", "T3FREE", "THYROIDAB" in the last 72 hours. Anemia Panel: No results for input(s): "VITAMINB12", "FOLATE", "FERRITIN", "TIBC", "IRON", "RETICCTPCT" in the last 72 hours. Sepsis Labs: Recent Labs  Lab 04/26/22 2015  LATICACIDVEN 0.9    Recent Results (from the past 240 hour(s))  Urine Culture     Status: Abnormal   Collection Time: 04/26/22  7:13 AM   Specimen: Urine, Catheterized  Result Value Ref Range  Status   Specimen Description   Final    URINE, CATHETERIZED Performed at [facility], [address], [place], [place] [address]    Special Requests   Final    NONE Performed at [facility], [address], [place], [place] [address]    Culture >=100,000 COLONIES/mL ESCHERICHIA COLI (A)  Final   Report Status 04/28/2022 FINAL  Final   Organism ID, Bacteria ESCHERICHIA COLI (A)  Final      Susceptibility   Escherichia coli - MIC*    AMPICILLIN >=32 RESISTANT Resistant     CEFAZOLIN >=64 RESISTANT Resistant     CEFEPIME <=0.12 SENSITIVE Sensitive     CEFTRIAXONE <=0.25 SENSITIVE Sensitive     CIPROFLOXACIN <=0.25 SENSITIVE Sensitive     GENTAMICIN <=1 SENSITIVE Sensitive     IMIPENEM <=0.25 SENSITIVE Sensitive     NITROFURANTOIN <=16 SENSITIVE Sensitive     TRIMETH/SULFA <=20 SENSITIVE Sensitive     AMPICILLIN/SULBACTAM >=32 RESISTANT Resistant     PIP/TAZO 8 SENSITIVE Sensitive     * >=100,000 COLONIES/mL ESCHERICHIA COLI  Blood culture  (routine x 2)     Status: None (Preliminary result)   Collection Time: 04/26/22  2:31 PM   Specimen: BLOOD RIGHT FOREARM  Result Value Ref Range Status   Specimen Description   Final    BLOOD RIGHT FOREARM Performed at [facility], [address] [place], [place] [address]    Special Requests   Final    BOTTLES DRAWN AEROBIC AND ANAEROBIC Blood Culture results may not be optimal due to an excessive volume of blood received in culture bottles Performed at [facility], [address], [place], [place] [address]    Culture   Final    NO GROWTH 3 DAYS Performed at [facility], [address] [place], [place] [address]    Report Status PENDING  Incomplete  Blood culture (routine x 2)     Status: None (Preliminary result)   Collection Time: 04/26/22  3:07 PM   Specimen: BLOOD  Result Value Ref Range Status   Specimen Description   Final    BLOOD RIGHT ANTECUBITAL Performed at [facility], [address] [place], [place] [address]    Special Requests   Final    BOTTLES DRAWN AEROBIC AND ANAEROBIC Blood Culture results may not be optimal due to an inadequate volume of blood received in culture bottles Performed at [facility], [address] [place], [place] [address]    Culture   Final    NO GROWTH 3 DAYS Performed at [facility], [address], [place], [place] [address]    Report Status PENDING  Incomplete         Radiology Studies: No results found.         LOS: 1 day   Time spent= 35 mins    Toiya Morrish Joline Maxcy, MD [facility]  If 7PM-7AM, please contact night-coverage  04/29/2022, 7:48 AM

## 2022-04-29 NOTE — Progress Notes (Signed)
89 year old female patient admitted for candida rash of groin, UTI due to urinary indwelling foley catheter, Pressure injury of right buttock stage 1. Vital signs were T 98.3 HR 73 BP 147/68 RR 14 O2 95.   Pt is alert and oriented x 4. Pt has clear speech and has strong hand grip bilaterally. S1 and S2 sounds are present with no mumurs. Pt has diminished, clear breath sounds bilaterally. Normal bowel sounds are heard in all four quadrants. Indwelling urinary catheter is present. Pt has full sensation in both upper extremities but numbness in both lower extremities due to stif person syndrome. Capillary refill is less than 2 seconds.   Barnett Abu, Student RN

## 2022-04-30 DIAGNOSIS — N390 Urinary tract infection, site not specified: Secondary | ICD-10-CM | POA: Diagnosis not present

## 2022-04-30 DIAGNOSIS — T83511A Infection and inflammatory reaction due to indwelling urethral catheter, initial encounter: Secondary | ICD-10-CM | POA: Diagnosis not present

## 2022-04-30 LAB — CULTURE, BLOOD (ROUTINE X 2): Culture: NO GROWTH

## 2022-04-30 LAB — BASIC METABOLIC PANEL
Anion gap: 8 (ref 5–15)
BUN: 8 mg/dL (ref 8–23)
CO2: 28 mmol/L (ref 22–32)
Calcium: 8.5 mg/dL — ABNORMAL LOW (ref 8.9–10.3)
Chloride: 100 mmol/L (ref 98–111)
Creatinine, Ser: 0.48 mg/dL (ref 0.44–1.00)
GFR, Estimated: >60 mL/min (ref >60)
Glucose, Bld: 111 mg/dL — ABNORMAL HIGH (ref 70–99)
Potassium: 3.8 mmol/L (ref 3.5–5.1)
Sodium: 136 mmol/L (ref 135–145)

## 2022-04-30 LAB — CBC
HCT: 35.2 % — ABNORMAL LOW (ref 36.0–46.0)
Hemoglobin: 11.3 g/dL — ABNORMAL LOW (ref 12.0–15.0)
MCH: 30.2 pg (ref 26.0–34.0)
MCHC: 32.1 g/dL (ref 30.0–36.0)
MCV: 94.1 fL (ref 80.0–100.0)
Platelets: 276 10*3/uL (ref 150–400)
RBC: 3.74 MIL/uL — ABNORMAL LOW (ref 3.87–5.11)
RDW: 13.3 % (ref 11.5–15.5)
WBC: 5.6 10*3/uL (ref 4.0–10.5)
nRBC: 0 % (ref 0.0–0.2)

## 2022-04-30 LAB — MAGNESIUM: Magnesium: 2 mg/dL (ref 1.7–2.4)

## 2022-04-30 MED ORDER — DIAZEPAM 5 MG PO TABS
5.0000 mg | ORAL_TABLET | Freq: Four times a day (QID) | ORAL | 5 refills | Status: DC | PRN
Start: 2022-04-30 — End: 2022-04-30

## 2022-04-30 MED ORDER — SULFAMETHOXAZOLE-TRIMETHOPRIM 800-160 MG PO TABS: 1.0000 | ORAL_TABLET | Freq: Two times a day (BID) | ORAL | 0 refills | Status: AC

## 2022-04-30 MED ORDER — DIAZEPAM 5 MG PO TABS
5.0000 mg | ORAL_TABLET | Freq: Four times a day (QID) | ORAL | 0 refills | Status: DC | PRN
Start: 2022-04-30 — End: 2022-05-06

## 2022-04-30 MED ORDER — BACLOFEN 5 MG PO TABS
5.0000 mg | ORAL_TABLET | Freq: Three times a day (TID) | ORAL | 1 refills | Status: DC
Start: 2022-04-30 — End: 2022-05-06

## 2022-04-30 NOTE — TOC Transition Note (Addendum)
Transition of Care [facility]) - CM/SW Discharge Note  Patient Details  Name: Carmen Haney MRN: [id number] Date of Birth: 1930/06/29  Transition of Care [facility]) CM/SW Contact:  Ewing Schlein, LCSW Phone Number: 04/30/2022, 1:42 PM  Clinical Narrative: Patient will discharge to her daughter's, [name], home today. Per daughter, she has set up transportation through [employer] to pick up the patient around 4pm today. [facility] has already contacted daughter to schedule the first [facility] visit. TOC signing off.  Addendum: CSW notified patient's transportation fell through and will now need PTAR. CSW confirmed address with daughter. Medical necessity form done; PTAR scheduled.  Final next level of care: Home w Home Health Services Barriers to Discharge: Barriers Resolved  Patient Goals and CMS Choice CMS Medicare.gov Compare Post Acute Care list provided to:: Patient Represenative (must comment) Choice offered to / list presented to : Adult Children  Discharge Plan and Services Additional resources added to the After Visit Summary for   In-house Referral: Clinical Social Work       DME Arranged: N/A DME Agency: NA HH Arranged: RN, PT, OT, Nurse's Aide HH Agency: [facility] Date HH Agency Contacted: 04/28/22 Representative spoke with at [facility] Agency: Tresa Endo  Social Determinants of Health (SDOH) Interventions SDOH Screenings   Food Insecurity: No Food Insecurity (03/15/2022)  Housing: Low Risk  (03/15/2022)  Transportation Needs: No Transportation Needs (03/15/2022)  Utilities: Not At Risk (03/15/2022)  Alcohol Screen: Low Risk  (02/23/2022)  Depression (PHQ2-9): Low Risk  (02/23/2022)  Financial Resource Strain: Low Risk  (02/23/2022)  Physical Activity: Inactive (02/23/2022)  Social Connections: Socially Isolated (02/23/2022)  Stress: Stress Concern Present (02/23/2022)  Tobacco Use: Medium Risk (04/26/2022)   Readmission Risk Interventions    11/27/2021    1:51 PM  Readmission Risk  Prevention Plan  Transportation Screening Complete  PCP or Specialist Appt within 5-7 Days Complete  Home Care Screening Complete  Medication Review (RN CM) Complete

## 2022-04-30 NOTE — Progress Notes (Signed)
Discharge instructions given to patient and all questions were answered.  

## 2022-04-30 NOTE — Discharge Summary (Signed)
Physician Discharge Summary  Carmen Haney:[id number] DOB: 07-Oct-1930 DOA: 04/26/2022  PCP: [name], NP  Admit date: 04/26/2022 Discharge date: 04/30/2022  Time spent: 40 minutes  Recommendations for Outpatient Follow-up:  Follow outpatient CBC/CMP  Follow with neurology as scheduled Follow outpatient regarding possible suprapubic catheter   Discharge Diagnoses:  Principal Problem:   UTI (urinary tract infection) due to urinary indwelling Foley catheter Active Problems:   HTN (hypertension)   Hyperlipidemia   Hypothyroidism (acquired)   Chronic kidney disease (CKD), stage III (moderate)   Anxiety disorder   Atrial fibrillation   UTI (urinary tract infection)   Pressure injury of skin   Discharge Condition: stable  Diet recommendation: heart healthy  Filed Weights   04/26/22 1700  Weight: 90.7 kg    History of present illness:   89 y.o. female with medical history significant for stiff person syndrome, paroxysmal atrial fibrillation, hypertension, hyperlipidemia and chronic diastolic congestive heart failure with chronic Foley catheter, be admitted to the hospital with catheter associated UTI.  Due to the recurring issue, the case was discussed with Dr. Arita Miss from urology who recommended IR consultation for suprapubic catheter placement.  IR consulted but family and patient chose to decline suprapubic catheter placement during this admission.  She's currently improved, will send with another 2 days of abx at discharge.  Plan for outpatient follow up.  See below for additional details     Hospital Course:  Assessment and Plan:  Catheter associated urinary tract infection-not meeting sepsis criteria Chronic Foley catheter -Patient has had chronic Foley catheter in for at least past 2 months, last time changed about 2 weeks ago.  Cultures growing E. coli, sensitive to bactrim --will discharge with additional 2 days of bactrim --foley was exchanged in the ED --  I discussed case with Dr. Arita Miss from urology who recommended IR consultation for suprapubic catheter placement.  Patient and family initially interested in suprapubic catheter placement but now declining and would like to think about this and readdress this in the future   Stiff person syndrome Bilateral lower extremity weakness Spastic back - Follows outpatient neurology; Dr Delane Ginger.  Plan is to manage her symptoms with baclofen and slowly titrate off Valium -- follow outpatient as scheduled    HTN (hypertension) -IV as needed     Hypothyroidism (acquired) -Synthroid     Chronic kidney disease (CKD), stage III (moderate) -Stable   Anxiety disorder -On as needed Valium    Atrial fibrillation, paroxysmal - Eliquis   Pressure Ulcers Pressure Injury 04/26/22 Buttocks Left Stage 1 -  Intact skin with non-blanchable redness of Carmen Haney localized area usually over Carmen Haney bony prominence. blister/denuding area L buttock (Active)  04/26/22 1752  Location: Buttocks  Location Orientation: Left  Staging: Stage 1 -  Intact skin with non-blanchable redness of Carmen Haney localized area usually over Carmen Haney bony prominence.  Wound Description (Comments): blister/denuding area L buttock  Present on Admission: Yes     Pressure Injury 04/26/22 Heel Left;Posterior Stage 1 -  Intact skin with non-blanchable redness of Carmen Haney localized area usually over Carmen Haney bony prominence. nonblanchable area L heel (Active)  04/26/22 1753  Location: Heel  Location Orientation: Left;Posterior  Staging: Stage 1 -  Intact skin with non-blanchable redness of Kou Gucciardo localized area usually over Carmen Haney bony prominence.  Wound Description (Comments): nonblanchable area L heel  Present on Admission: Yes  Continue offloading at home    Procedures: none   Consultations: IR urology  Discharge Exam: Vitals:  04/30/22 0459 04/30/22 1332  BP: (!) 141/76 (!) 145/83  Pulse: 81 77  Resp: 18 18  Temp: 97.8 F (36.6 C) 97.6 F (36.4 C)  SpO2: 90% 98%   No  new complaints Discussed d/c with daughter  General: No acute distress. Cardiovascular: RRR Lungs: unlabored Abdomen: Soft, nontender, nondistended Neurological: Alert and oriented 3.  Limited movement of LE's Extremities: No clubbing or cyanosis. No edema.   Discharge Instructions   Discharge Instructions     Call MD for:  difficulty breathing, headache or visual disturbances   Complete by: As directed    Call MD for:  extreme fatigue   Complete by: As directed    Call MD for:  hives   Complete by: As directed    Call MD for:  persistant dizziness or light-headedness   Complete by: As directed    Call MD for:  persistant nausea and vomiting   Complete by: As directed    Call MD for:  redness, tenderness, or signs of infection (pain, swelling, redness, odor or green/yellow discharge around incision site)   Complete by: As directed    Call MD for:  severe uncontrolled pain   Complete by: As directed    Call MD for:  temperature >100.4   Complete by: As directed    Diet - low sodium heart healthy   Complete by: As directed    Discharge instructions   Complete by: As directed    You were seen for Carmen Haney urinary tract infection.  You've improved with antibiotics.  We'll send you with another 2 days of antibiotics to complete Thelda Gagan course.  Follow up with the outpatient providers regarding Carmen Haney possible suprapubic catheter.  Follow up with your neurologist for your SPS as planned.  I'll send Carmen Haney refill of the baclofen to [facility].  I'll send 7 days of the valium to use as needed to the [facility] as well (if you need more of this, you'll need to follow up with your neurologist).  Continue offloading to help prevent pressure injuries.  Return for new, recurrent, or worsening symptoms.  Please ask your PCP to request records from this hospitalization so they know what was done and what the next steps will be.   Discharge wound care:   Complete by: As directed    Continue  offloading to help prevent pressure ulcers   Increase activity slowly   Complete by: As directed       Allergies as of 04/30/2022       Reactions   Ace Inhibitors Cough   Latex Rash        Medication List     TAKE these medications    acetaminophen 500 MG tablet Commonly known as: TYLENOL Take 1,000 mg by mouth in the morning and at bedtime.   apixaban 5 MG Tabs tablet Commonly known as: ELIQUIS Take 1 tablet (5 mg total) by mouth 2 (two) times daily.   Baclofen 5 MG Tabs Take 1 tablet (5 mg total) by mouth 3 (three) times daily.   brexpiprazole 0.25 MG Tabs tablet Commonly known as: Rexulti Take 1 tablet (0.25 mg total) by mouth daily.   Rexulti 0.5 MG Tabs Generic drug: Brexpiprazole Take by mouth.   CALCIUM/D3 ADULT GUMMIES PO Take 500 mg by mouth in the morning.   diazepam 5 MG tablet Commonly known as: Valium Take 1 tablet (5 mg total) by mouth every 6 (six) hours as needed for up to 7 days for muscle  spasms. What changed: additional instructions   docusate sodium 100 MG capsule Commonly known as: COLACE Take 1 capsule (100 mg total) by mouth 2 (two) times daily.   DULoxetine HCl 40 MG Cpep Take 1 capsule (40 mg total) by mouth at bedtime.   furosemide 20 MG tablet Commonly known as: LASIX Take 20 mg by mouth daily.   levothyroxine 150 MCG tablet Commonly known as: SYNTHROID Take 1 tablet (150 mcg total) by mouth daily before breakfast.   metoprolol tartrate 25 MG tablet Commonly known as: LOPRESSOR Take 0.5 tablets (12.5 mg total) by mouth 2 (two) times daily.   nystatin-triamcinolone ointment Commonly known as: MYCOLOG Apply 1 Application topically 2 (two) times daily.   polyethylene glycol 17 g packet Commonly known as: MIRALAX / GLYCOLAX Take 17 g by mouth daily.   PROBIOTIC GUMMIES PO Take 2 tablets by mouth in the morning.   sulfamethoxazole-trimethoprim 800-160 MG tablet Commonly known as: BACTRIM DS Take 1 tablet by mouth 2  (two) times daily for 2 days.               Discharge Care Instructions  (From admission, onward)           Start     Ordered   04/30/22 0000  Discharge wound care:       Comments: Continue offloading to help prevent pressure ulcers   04/30/22 1258           Allergies  Allergen Reactions   Ace Inhibitors Cough   Latex Rash    Follow-up Information     Eloisa Northern, MD .   Specialty: Internal Medicine Contact information: [address] [place] [address] [phone]         [facility] Follow up.   Specialty: Home Health Services Why: [facility] will provide PT, OT, nursing, and an aide in the home after discharge. Contact information: [address] [place] [address] [phone]                  The results of significant diagnostics from this hospitalization (including imaging, microbiology, ancillary and laboratory) are listed below for reference.    Significant Diagnostic Studies: CT ABDOMEN PELVIS W CONTRAST  Result Date: 04/26/2022 CLINICAL DATA:  Abdominal pain. Persistent rectal and vaginal pain since last visit. Chronic constipation. EXAM: CT ABDOMEN AND PELVIS WITH CONTRAST TECHNIQUE: Multidetector CT imaging of the abdomen and pelvis was performed using the standard protocol following bolus administration of intravenous contrast. RADIATION DOSE REDUCTION: This exam was performed according to the departmental dose-optimization program which includes automated exposure control, adjustment of the mA and/or kV according to patient size and/or use of iterative reconstruction technique. CONTRAST:  OMNIPAQUE IOHEXOL 300 MG/ML  SOLN COMPARISON:  02/11/2022 FINDINGS: Lower chest: No pleural fluid or airspace consolidation. Mild scarring identified within the visualized lung bases. Hepatobiliary: No suspicious liver abnormality. Again seen are several small, less than 1 cm low-density liver lesions which likely  represent cysts. The gallbladder appears normal. No CT visible stones, gallbladder wall inflammation, or bile duct dilatation. Pancreas: Unremarkable. No pancreatic ductal dilatation or surrounding inflammatory changes. Spleen: Normal in size without focal abnormality. Adrenals/Urinary Tract: Normal adrenal glands. Right parapelvic cyst is unchanged measuring 3 cm, image 32/2. Posterior cortex left kidney cyst is unchanged measuring 1.1 cm. Additional, less than 1 cm hypodense kidney lesions are noted which are technically too small to characterize compatible with benign Bosniak class 2 lesions. No  follow-up imaging recommended. Upper pole right kidney stone measures 5 mm, image 62/4. No left kidney stones. No hydronephrosis identified bilaterally. The urinary bladder is partially decompressed around Tymeka Privette Foley catheter. There is wall thickening and enhancement of the urinary bladder with surrounding soft tissue haziness. The wall thickening may in part reflect incomplete distension. No focal bladder lesion identified Stomach/Bowel: Stomach is normal. No pathologic dilatation of the large or small bowel loops. No bowel wall thickening or inflammation. The appendix is visualized and appears normal. Vascular/Lymphatic: No aneurysm. Mild aortic atherosclerotic calcifications. No signs of abdominopelvic adenopathy. Reproductive: Aortic atherosclerosis. No enlarged abdominal or pelvic lymph nodes. Other: There is new presacral soft tissue edema. No free fluid or fluid collections identified. No signs of pneumoperitoneum. Musculoskeletal: No acute findings. Bones appear diffusely osteopenic. Stable compression deformity involving the T12 vertebra with loss of approximately 30% of the superior endplate height. Mild L3 central vertebral body endplate deformity is unchanged. IMPRESSION: 1. The urinary bladder is partially decompressed around  Cohick Foley catheter. There is wall thickening and enhancement of the urinary bladder with  surrounding soft tissue haziness. The wall thickening may in part reflect incomplete distension. Correlate for any clinical signs or symptoms of cystitis. 2. New presacral soft tissue edema. Etiology is indeterminate. 3. Nonobstructing right renal calculus. 4. Stable T12 and L3 vertebral body compression deformities. 5.  Aortic Atherosclerosis (ICD10-I70.0). Electronically Signed   By: Signa Kell M.D.   On: 04/26/2022 09:53   DG Chest Port 1 View  Result Date: 04/18/2022 CLINICAL DATA:  Provided history: Questionable sepsis-evaluate for abnormality. Catheter pain. Lack of urine drainage. EXAM: PORTABLE CHEST 1 VIEW COMPARISON:  Prior chest radiographs 03/14/2022 and earlier. FINDINGS: Heart size at the upper limits of normal. Ill-defined opacity of within the medial right lung base. No appreciable airspace consolidation on the left. No evidence of pleural effusion or pneumothorax. No acute osseous abnormality identified. Degenerative changes of the spine. IMPRESSION: Ill-defined opacity within the medial right lung base, which may reflect pneumonia or atelectasis. Electronically Signed   By: Jackey Loge D.O.   On: 04/18/2022 11:30   DG Hip Unilat W or Wo Pelvis 2-3 Views Right  Result Date: 04/12/2022 CLINICAL DATA:  Fall. EXAM: DG HIP (WITH PELVIS) 3V RIGHT COMPARISON:  None Available. FINDINGS: There is no evidence of hip fracture or dislocation. There is no evidence of arthropathy or other focal bone abnormality. Generalized osteopenia with diminished bony detail. IMPRESSION: Negative. Electronically Signed   By: Tiburcio Pea M.D.   On: 04/12/2022 05:45   DG Hip Unilat W or Wo Pelvis 2-3 Views Left  Result Date: 04/12/2022 CLINICAL DATA:  Fall. EXAM: DG HIP  2V LEFT COMPARISON:  None Available. FINDINGS: There is no evidence of hip fracture or dislocation. There is no evidence of arthropathy or other focal bone abnormality. IMPRESSION: Negative. Electronically Signed   By: Tiburcio Pea M.D.    On: 04/12/2022 05:44   CT Head Wo Contrast  Result Date: 04/12/2022 CLINICAL DATA:  Minor head trauma EXAM: CT HEAD WITHOUT CONTRAST CT CERVICAL SPINE WITHOUT CONTRAST TECHNIQUE: Multidetector CT imaging of the head and cervical spine was performed following the standard protocol without intravenous contrast. Multiplanar CT image reconstructions of the cervical spine were also generated. RADIATION DOSE REDUCTION: This exam was performed according to the departmental dose-optimization program which includes automated exposure control, adjustment of the mA and/or kV according to patient size and/or use of iterative reconstruction technique. COMPARISON:  11/26/2021 FINDINGS: CT HEAD FINDINGS Brain:  No evidence of acute infarction, hemorrhage, hydrocephalus, extra-axial collection or mass lesion/mass effect. Chronic small vessel ischemia in the cerebral white matter that is extensive. Vascular: No hyperdense vessel or unexpected calcification. Skull: Normal. Negative for fracture or focal lesion. Sinuses/Orbits: No evidence of injury. Chronic left sphenoid sinusitis with partial opacification and sclerosis. CT CERVICAL SPINE FINDINGS Alignment: Normal. Skull base and vertebrae: No acute fracture. No primary bone lesion or focal pathologic process. Soft tissues and spinal canal: No prevertebral fluid or swelling. No visible canal hematoma. Disc levels: Ordinary, generalized degenerative changes. Upper chest: No evidence of injury IMPRESSION: No evidence of acute intracranial or cervical spine injury. Electronically Signed   By: Tiburcio Pea M.D.   On: 04/12/2022 05:17   CT Cervical Spine Wo Contrast  Result Date: 04/12/2022 CLINICAL DATA:  Minor head trauma EXAM: CT HEAD WITHOUT CONTRAST CT CERVICAL SPINE WITHOUT CONTRAST TECHNIQUE: Multidetector CT imaging of the head and cervical spine was performed following the standard protocol without intravenous contrast. Multiplanar CT image reconstructions of the  cervical spine were also generated. RADIATION DOSE REDUCTION: This exam was performed according to the departmental dose-optimization program which includes automated exposure control, adjustment of the mA and/or kV according to patient size and/or use of iterative reconstruction technique. COMPARISON:  11/26/2021 FINDINGS: CT HEAD FINDINGS Brain: No evidence of acute infarction, hemorrhage, hydrocephalus, extra-axial collection or mass lesion/mass effect. Chronic small vessel ischemia in the cerebral white matter that is extensive. Vascular: No hyperdense vessel or unexpected calcification. Skull: Normal. Negative for fracture or focal lesion. Sinuses/Orbits: No evidence of injury. Chronic left sphenoid sinusitis with partial opacification and sclerosis. CT CERVICAL SPINE FINDINGS Alignment: Normal. Skull base and vertebrae: No acute fracture. No primary bone lesion or focal pathologic process. Soft tissues and spinal canal: No prevertebral fluid or swelling. No visible canal hematoma. Disc levels: Ordinary, generalized degenerative changes. Upper chest: No evidence of injury IMPRESSION: No evidence of acute intracranial or cervical spine injury. Electronically Signed   By: Tiburcio Pea M.D.   On: 04/12/2022 05:17   DG Knee 2 Views Left  Result Date: 04/12/2022 CLINICAL DATA:  Fall with pain. EXAM: LEFT KNEE - 1-2 VIEW; RIGHT KNEE - 1-2 VIEW COMPARISON:  11/26/2021. FINDINGS: Right knee: No acute fracture or dislocation. There is an old healed fracture of the proximal fibula. Moderate tricompartmental degenerative changes are present. There is Saria Haran trace suprapatellar joint effusion. Enthesopathic changes are present at the patella. Left knee: No acute fracture or dislocation. There are moderate tricompartmental degenerative changes, most pronounced in the patellofemoral and lateral compartments. No joint effusion. The soft tissues are within normal limits. IMPRESSION: 1. No acute fracture or dislocation  bilaterally. 2. Small suprapatellar joint effusion on the right. Electronically Signed   By: Thornell Sartorius M.D.   On: 04/12/2022 03:36   DG Knee 2 Views Right  Result Date: 04/12/2022 CLINICAL DATA:  Fall with pain. EXAM: LEFT KNEE - 1-2 VIEW; RIGHT KNEE - 1-2 VIEW COMPARISON:  11/26/2021. FINDINGS: Right knee: No acute fracture or dislocation. There is an old healed fracture of the proximal fibula. Moderate tricompartmental degenerative changes are present. There is Kameryn Tisdel trace suprapatellar joint effusion. Enthesopathic changes are present at the patella. Left knee: No acute fracture or dislocation. There are moderate tricompartmental degenerative changes, most pronounced in the patellofemoral and lateral compartments. No joint effusion. The soft tissues are within normal limits. IMPRESSION: 1. No acute fracture or dislocation bilaterally. 2. Small suprapatellar joint effusion on the right. Electronically Signed  By: Thornell Sartorius M.D.   On: 04/12/2022 03:36   DG Shoulder Left Port  Result Date: 04/12/2022 CLINICAL DATA:  Fall, left shoulder pain EXAM: LEFT SHOULDER COMPARISON:  None Available. FINDINGS: Normal alignment. No acute fracture or dislocation. Acromioclavicular joint space is preserved. Glenohumeral joint space is not well profiled. Limited evaluation of the left hemithorax is unremarkable. IMPRESSION: 1. No acute fracture or dislocation. Electronically Signed   By: Helyn Numbers M.D.   On: 04/12/2022 03:35   DG Shoulder Right Port  Result Date: 04/12/2022 CLINICAL DATA:  Fall, shoulder pain EXAM: RIGHT SHOULDER - 1 VIEW COMPARISON:  None Available. FINDINGS: Normal alignment. No acute fracture or dislocation. Acromioclavicular joint space is preserved. Glenohumeral joint space is not well profiled. Limited evaluation of the right hemithorax is unremarkable. IMPRESSION: 1. No acute fracture or dislocation. Electronically Signed   By: Helyn Numbers M.D.   On: 04/12/2022 03:30     Microbiology: Recent Results (from the past 240 hour(s))  Urine Culture     Status: Abnormal   Collection Time: 04/26/22  7:13 AM   Specimen: Urine, Catheterized  Result Value Ref Range Status   Specimen Description   Final    URINE, CATHETERIZED Performed at [facility], [address] [place], [place] [address]    Special Requests   Final    NONE Performed at [facility], [address], [place], [place] [address]    Culture >=100,000 COLONIES/mL ESCHERICHIA COLI (Mulki Roesler)  Final   Report Status 04/28/2022 FINAL  Final   Organism ID, Bacteria ESCHERICHIA COLI (Azrielle Springsteen)  Final      Susceptibility   Escherichia coli - MIC*    AMPICILLIN >=32 RESISTANT Resistant     CEFAZOLIN >=64 RESISTANT Resistant     CEFEPIME <=0.12 SENSITIVE Sensitive     CEFTRIAXONE <=0.25 SENSITIVE Sensitive     CIPROFLOXACIN <=0.25 SENSITIVE Sensitive     GENTAMICIN <=1 SENSITIVE Sensitive     IMIPENEM <=0.25 SENSITIVE Sensitive     NITROFURANTOIN <=16 SENSITIVE Sensitive     TRIMETH/SULFA <=20 SENSITIVE Sensitive     AMPICILLIN/SULBACTAM >=32 RESISTANT Resistant     PIP/TAZO 8 SENSITIVE Sensitive     * >=100,000 COLONIES/mL ESCHERICHIA COLI  Blood culture (routine x 2)     Status: None (Preliminary result)   Collection Time: 04/26/22  2:31 PM   Specimen: BLOOD RIGHT FOREARM  Result Value Ref Range Status   Specimen Description   Final    BLOOD RIGHT FOREARM Performed at [facility], [address], [place], [place] [address]    Special Requests   Final    BOTTLES DRAWN AEROBIC AND ANAEROBIC Blood Culture results may not be optimal due to an excessive volume of blood received in culture bottles Performed at [facility], [address], [place], [place] [address]    Culture   Final    NO GROWTH 4 DAYS Performed at [facility], [address] [place], [place] [address]    Report Status PENDING  Incomplete  Blood culture (routine x  2)     Status: None (Preliminary result)   Collection Time: 04/26/22  3:07 PM   Specimen: BLOOD  Result Value Ref Range Status   Specimen Description   Final    BLOOD RIGHT ANTECUBITAL Performed at [facility], [address], [place], [place] [address]    Special Requests   Final    BOTTLES DRAWN AEROBIC  AND ANAEROBIC Blood Culture results may not be optimal due to an inadequate volume of blood received in culture bottles Performed at [facility], [address] [place], [place] [address]    Culture   Final    NO GROWTH 4 DAYS Performed at [facility], [address], [place], [place] [address]    Report Status PENDING  Incomplete     Labs: Basic Metabolic Panel: Recent Labs  Lab 04/26/22 0756 04/27/22 0454 04/30/22 0505  NA 132* 133* 136  K 3.5 3.5 3.8  CL 93* 98 100  CO2 29 27 28   GLUCOSE 110* 103* 111*  BUN 9 7* 8  CREATININE 0.61 0.49 0.48  CALCIUM 8.7* 8.1* 8.5*  MG  --   --  2.0   Liver Function Tests: Recent Labs  Lab 04/26/22 0756  AST 13*  ALT 8  ALKPHOS 68  BILITOT 0.6  PROT 6.7  ALBUMIN 3.3*   Recent Labs  Lab 04/26/22 0756  LIPASE 27   No results for input(s): "AMMONIA" in the last 168 hours. CBC: Recent Labs  Lab 04/26/22 0756 04/27/22 0454 04/30/22 0505  WBC 8.7 5.7 5.6  NEUTROABS 6.9  --   --   HGB 12.9 10.8* 11.3*  HCT 40.6 33.7* 35.2*  MCV 94.0 94.4 94.1  PLT 319 255 276   Cardiac Enzymes: No results for input(s): "CKTOTAL", "CKMB", "CKMBINDEX", "TROPONINI" in the last 168 hours. BNP: BNP (last 3 results) No results for input(s): "BNP" in the last 8760 hours.  ProBNP (last 3 results) No results for input(s): "PROBNP" in the last 8760 hours.  CBG: No results for input(s): "GLUCAP" in the last 168 hours.     Signed:  Lacretia Nicks MD.  [facility] 04/30/2022, 1:44 PM

## 2022-05-01 DIAGNOSIS — I5032 Chronic diastolic (congestive) heart failure: Secondary | ICD-10-CM | POA: Diagnosis not present

## 2022-05-01 DIAGNOSIS — I2510 Atherosclerotic heart disease of native coronary artery without angina pectoris: Secondary | ICD-10-CM | POA: Diagnosis not present

## 2022-05-01 DIAGNOSIS — N183 Chronic kidney disease, stage 3 unspecified: Secondary | ICD-10-CM | POA: Diagnosis not present

## 2022-05-01 DIAGNOSIS — T83511A Infection and inflammatory reaction due to indwelling urethral catheter, initial encounter: Secondary | ICD-10-CM | POA: Diagnosis not present

## 2022-05-01 DIAGNOSIS — I130 Hypertensive heart and chronic kidney disease with heart failure and stage 1 through stage 4 chronic kidney disease, or unspecified chronic kidney disease: Secondary | ICD-10-CM | POA: Diagnosis not present

## 2022-05-01 DIAGNOSIS — N390 Urinary tract infection, site not specified: Secondary | ICD-10-CM | POA: Diagnosis not present

## 2022-05-01 DIAGNOSIS — J439 Emphysema, unspecified: Secondary | ICD-10-CM | POA: Diagnosis not present

## 2022-05-01 DIAGNOSIS — F322 Major depressive disorder, single episode, severe without psychotic features: Secondary | ICD-10-CM | POA: Diagnosis not present

## 2022-05-01 DIAGNOSIS — F419 Anxiety disorder, unspecified: Secondary | ICD-10-CM | POA: Diagnosis not present

## 2022-05-01 LAB — CULTURE, BLOOD (ROUTINE X 2)

## 2022-05-04 ENCOUNTER — Telehealth: Payer: Self-pay

## 2022-05-04 NOTE — Telephone Encounter (Signed)
Cara with [facility] called requesting verbal orders for 1 time a week for 4 weeks then 2 times a month for 1 month for foley catheter management

## 2022-05-04 NOTE — Progress Notes (Unsigned)
NEUROLOGY FOLLOW UP OFFICE NOTE  Carmen Haney [id number]  Subjective:  Carmen Haney is a 89 y.o. year old female with a history of HTN, HLD, pAfib (on eliquis), CAD, hypothyroidism, osteoporosis, hearing loss, breast cancer (2010), OA, compression fractures of the spine (T12 and L3), depression, and anxiety who we last saw on 03/06/22.  To briefly review: Patient's symptoms started some time back. She has had difficulty with balance and gait for about 5 years. About 1 year ago she was walking with a walker, but having difficulty getting to the bathroom quickly enough. She started having trouble getting up from bed and chair. Patient had a fall in 11/2021. She was having weakness of lower extremities about 3 weeks prior that seemed new. She got up and reached for the walker, her legs gave way and she fell. She had to go to the hospital. She had a fracture of right fibula, but still hold weight. She went to rehab. She was doing well. Prior to being discharged, she was starting to get weaker. She started having mild spasms. She was also fearful of walking.   Her spasms continued to worse and became very severe. The spasms included the trunk and legs. [name]) called patient's geriatrician on 02/10/22 stating that patient could not get up and having severe spasms on 02/09/22 and 911 was called. She is weak in the legs and cannot walk. Daughter was recommended to take patient to ED. Patient was admitted to [facility] from 02/10/22 to 02/14/22. While hospitalized inflammatory markers were elevated and GAD abs were elevated to 160, raising concern for stiff person syndrome. Patient was started on diazepam 5 mg TID and given prednisone 40 mg for 4 days (for muscle strain). Per DC summary, pain and spasm had improved. She was discharged with home health PT/OT.    She had to have diazepam increased to 5 mg every 6-7 hours (QID). If she only takes it TID, she is in pain due to breakthrough. When she gets nervous  or anxious, the spasms worsen. She also takes Tylenol twice daily as well.   It is so hard to move her body, that it is hard to get up in the morning or get ready for bed. She has difficulty rolling over at night. She is immobile when sleeping.   She has had severe OA and knee replacement was recommended, but patient declined.  Most recent Assessment and Plan (03/06/22): Her neurological examination is pertinent for bilateral lower extremity weakness (in setting of significant peripheral edema into thighs). Available diagnostic data is significant for GAD ab positive to 160.3. MRI lumbar spine showed compression fractures at T12 and L3 which are chronic. 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 She is currently getting relief with valium 3-4 times daily. She is still not very mobile, but is working with home therapy. Much of today's appointment was discussing Stiff Person's Syndrome and family expectations/dynamics.   PLAN: -Continue Valium 5 mg 3-4 times daily -Could consider baclofen, but in the setting of ?weakness of lower extremities, this could make patient weaker -Continue home PT/OT -Information and prognosis of SPS was discussed  Since their last visit: Patient presented to the ED on 03/14/22 and was admitted until 03/19/22 for weakness. She was found to have a UTI and given cephalexin.  Her valium was continued and baclofen was added.  Daughters had messaged concerned that when patient was moved into assisted living, the wrong  diagnosis was on her valium. We spoke with [facility] who confirmed the correct diagnosis. Daughters were concerned the valium was PRN, but I explained that scheduling the medication could be dangerous.  Patient then had a fall on 04/12/22 during transition from wheelchair to bed. Patient was taken to ED where no fractures were identified. Patient returned to ED on 04/13/22 for pain and abdominal discomfort at  her Foley site. She was treated for UTI. She returned to the ED on 04/18/22 for urethral/suprapubic pain. Foley was not draining well. Patient improved after draining the bladder and adjusting the catheter.  Patient was admitted to [facility] from 04/26/22 to 04/30/22 for catheter associated UTI. Suprapubic catheter was discussed with patient and her family, but this was declined. Patient improved on antibiotics.  ***  MEDICATIONS:  Outpatient Encounter Medications as of 05/06/2022  Medication Sig   acetaminophen (TYLENOL) 500 MG tablet Take 1,000 mg by mouth in the morning and at bedtime.   apixaban (ELIQUIS) 5 MG TABS tablet Take 1 tablet (5 mg total) by mouth 2 (two) times daily.   Baclofen 5 MG TABS Take 1 tablet (5 mg total) by mouth 3 (three) times daily.   brexpiprazole (REXULTI) 0.25 MG TABS tablet Take 1 tablet (0.25 mg total) by mouth daily.   Calcium-Phosphorus-Vitamin D (CALCIUM/D3 ADULT GUMMIES PO) Take 500 mg by mouth in the morning.   diazepam (VALIUM) 5 MG tablet Take 1 tablet (5 mg total) by mouth every 6 (six) hours as needed for up to 7 days for muscle spasms.   docusate sodium (COLACE) 100 MG capsule Take 1 capsule (100 mg total) by mouth 2 (two) times daily.   DULoxetine 40 MG CPEP Take 1 capsule (40 mg total) by mouth at bedtime.   furosemide (LASIX) 20 MG tablet Take 20 mg by mouth daily.   levothyroxine (SYNTHROID) 150 MCG tablet Take 1 tablet (150 mcg total) by mouth daily before breakfast.   metoprolol tartrate (LOPRESSOR) 25 MG tablet Take 0.5 tablets (12.5 mg total) by mouth 2 (two) times daily.   nystatin-triamcinolone ointment (MYCOLOG) Apply 1 Application topically 2 (two) times daily.   polyethylene glycol (MIRALAX / GLYCOLAX) 17 g packet Take 17 g by mouth daily.   Probiotic Product (PROBIOTIC GUMMIES PO) Take 2 tablets by mouth in the morning.   REXULTI 0.5 MG TABS Take by mouth.   [DISCONTINUED] olmesartan (BENICAR) 20 MG tablet Take 20 mg by mouth daily.      No facility-administered encounter medications on file as of 05/06/2022.    PAST MEDICAL HISTORY: Past Medical History:  Diagnosis Date   ACE-inhibitor cough    Breast CA 2010   s/p lumpectomy & radiation   CAD (coronary artery disease) 07/1998   s/p cath in 2000. 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    Thyroid disease    Vertebral fracture, pathological     PAST SURGICAL HISTORY: Past Surgical History:  Procedure Laterality Date   BREAST BIOPSY Left    BREAST LUMPECTOMY Right March 2010   BREAST LUMPECTOMY WITH RADIOACTIVE SEED LOCALIZATION Left 09/20/2014   Procedure: LEFT BREAST LUMPECTOMY WITH RADIOACTIVE SEED LOCALIZATION;  Surgeon: [name], MD;  Location: [facility];  Service: General;  Laterality: Left;   CARPAL TUNNEL RELEASE Right    CESAREAN SECTION     EYE SURGERY     Right Eye   Skin Cancer Removed from Right Forearm     TOTAL ABDOMINAL HYSTERECTOMY      ALLERGIES: Allergies  Allergen Reactions   Ace Inhibitors Cough   Latex Rash    FAMILY HISTORY: Family History  Problem Relation Age of Onset   Coronary artery disease Mother    Hypertension Mother    Melanoma Mother    Cancer Mother 70       breast   Breast cancer Mother    Cancer Father 30       prostate   Cancer Sister 24       breast   Breast cancer Sister    Cancer Maternal Aunt 11       breast   Cancer Sister 38       breast   Breast cancer Sister    Breast cancer Daughter 70    SOCIAL HISTORY: Social History   Tobacco Use   Smoking status: Former    Types: Cigarettes    Quit date: 01/13/1963    Years since quitting: 59.3   Smokeless tobacco: Never   Tobacco  comments:    stopped in 1970's  Vaping Use   Vaping Use: Never used  Substance Use Topics   Alcohol use: No    Alcohol/week: 0.0 standard drinks of alcohol   Drug use: Never   Social History   Social History Narrative   Are you right handed or left handed? right   Are you currently employed ?    What is your current occupation?   Do you live at home alone?   Who lives with you? Daughter and husband   What type of home do you live in: 1 story or 2 story? two    Caffeine 2 cups a day      Objective:  Vital Signs:  There were no vitals taken for this visit.  General:*** General appearance: Awake and alert. No distress. Cooperative with exam.  Skin: No obvious rash or jaundice. HEENT: Atraumatic. Anicteric. Lungs: Non-labored breathing on room air  Heart: Regular Abdomen: Soft, non tender. Extremities: No edema. No obvious deformity.  Musculoskeletal: No obvious joint swelling. Psych: Affect appropriate.  Neurological: Mental Status: Alert. Speech fluent. No pseudobulbar affect Cranial Nerves: CNII: No RAPD. Visual fields intact. CNIII, IV, VI: PERRL. No nystagmus. EOMI. CN V: Facial sensation intact bilaterally to fine touch. [place]eter clench strong. Jaw jerk***. CN VII: Facial muscles symmetric and strong. No ptosis at rest or after sustained upgaze***. CN VIII: Hears finger rub well bilaterally. CN IX: No hypophonia. CN X: Palate elevates symmetrically. CN XI: Full strength shoulder shrug bilaterally. CN XII: Tongue protrusion full and midline. No atrophy or fasciculations. No significant dysarthria*** Motor: Tone is ***. *** fasciculations in *** extremities. *** atrophy. No grip or percussive myotonia.  Individual muscle group testing (MRC grade out of 5):  Movement     Neck flexion ***    Neck extension ***     Right Left   Shoulder abduction *** ***   Shoulder adduction *** ***   Shoulder ext rotation *** ***   Shoulder int rotation *** ***   Elbow  flexion *** ***   Elbow extension *** ***   Wrist extension *** ***   Wrist  flexion *** ***   Finger abduction - FDI *** ***   Finger abduction - ADM *** ***   Finger extension *** ***   Finger distal flexion - 2/3 *** ***   Finger distal flexion - 4/5 *** ***   Thumb flexion - FPL *** ***   Thumb abduction - APB *** ***    Hip flexion *** ***   Hip extension *** ***   Hip adduction *** ***   Hip abduction *** ***   Knee extension *** ***   Knee flexion *** ***   Dorsiflexion *** ***   Plantarflexion *** ***   Inversion *** ***   Eversion *** ***   Great toe extension *** ***   Great toe flexion *** ***     Reflexes:  Right Left   Bicep *** ***   Tricep *** ***   BrRad *** ***   Knee *** ***   Ankle *** ***    Pathological Reflexes: Babinski: *** response bilaterally*** Hoffman: *** Troemner: *** Pectoral: *** Palmomental: *** Facial: *** Midline tap: *** Sensation: Pinprick: *** Vibration: *** Temperature: *** Proprioception: *** Coordination: Intact finger-to- nose-finger bilaterally. Romberg negative.*** Gait: Able to rise from chair with arms crossed unassisted. Normal, narrow-based gait. Able to tandem walk. Able to walk on toes and heels.***  Labs and Imaging review: New results: 03/17/22: CBC unremarkable BMP: unremarkable Mg: wnl  CT head and cervical spine wo contrast (04/12/22): FINDINGS: CT HEAD FINDINGS   Brain: No evidence of acute infarction, hemorrhage, hydrocephalus, extra-axial collection or mass lesion/mass effect. Chronic small vessel ischemia in the cerebral white matter that is extensive.   Vascular: No hyperdense vessel or unexpected calcification.   Skull: Normal. Negative for fracture or focal lesion.   Sinuses/Orbits: No evidence of injury. Chronic left sphenoid sinusitis with partial opacification and sclerosis.   CT CERVICAL SPINE FINDINGS   Alignment: Normal.   Skull base and vertebrae: No acute fracture. No primary  bone lesion or focal pathologic process.   Soft tissues and spinal canal: No prevertebral fluid or swelling. No visible canal hematoma.   Disc levels: Ordinary, generalized degenerative changes.   Upper chest: No evidence of injury   IMPRESSION: No evidence of acute intracranial or cervical spine injury.  Previously reviewed results: 11/2021: Normal or unremarkable: CBC, BMP, ESR, vit D, TSH CMP significant for albumin of 3.1   02/11/22: CRP: 16.3 ESR: 58 GAD ab: 160.3 CK: 132 CMP: hyponatremia (Na 128 - chronic), albumin 2.9 CBC unremarkable   Imaging: CT head wo contrast (02/10/22): FINDINGS: Brain: No intracranial hemorrhage, mass effect, or midline shift. Brain volume is normal for age. No hydrocephalus. The basilar cisterns are patent. Minimal basal gangliar mineralization is likely senescent. Mild periventricular and deep chronic small vessel ischemic change. No evidence of territorial infarct or acute ischemia. No extra-axial or intracranial fluid collection.   Vascular: No hyperdense vessel or unexpected calcification.   Skull: No fracture or focal lesion.   Sinuses/Orbits: Chronic sphenoid sinusitis with cortical thickening and fluid. No acute findings. No mastoid effusion. Bilateral cataract resection.   Other: None.   IMPRESSION: 1. No acute intracranial abnormality. 2. Mild chronic small vessel ischemic change. 3. Chronic sphenoid sinusitis.   MRI lumbar spine wo contrast (02/11/22): FINDINGS: Segmentation:  Standard.   Alignment: Hyperlordosis with mild L2-3 retrolisthesis and L5-S1, L4-5 anterolisthesis   Vertebrae: Remote compression fractures at T12 and L3. Minimal superior endplate edema at L3 is likely reactive. Height loss is greater at T12 and  measures up to 50% anteriorly when compared to L1. No acute or subacute fracture. No aggressive bone lesion.   Conus medullaris and cauda equina: Conus extends to the L1-2 level. Conus and cauda  equina appear normal.   Paraspinal and other soft tissues: Negative for perispinal mass or inflammation. Expansion and T2 hyperintensity in the lower right iliopsoas   Disc levels:   T12- L1: Disc space narrowing and ventral spurring. No neural impingement   L1-L2: Disc space narrowing and bulging. Negative facets. No neural impingement   L2-L3: Disc narrowing and bulging. Bilateral inferior foraminal protrusion. Negative facets. Mild spinal stenosis   L3-L4: Disc space narrowing with inferior foraminal bulging. Mild facet spurring. Mild spinal stenosis   L4-L5: Degenerative facet spurring. Circumferential disc bulging. Mild spinal stenosis   L5-S1:Degenerative facet spurring with mild anterolisthesis. Mild circumferential disc bulging. No neural compression.   IMPRESSION: 1. Strain and/or hemorrhage partially covered at the right iliopsoas, consider CT of the pelvis. 2. No acute lumbar fracture. Remote compression fractures at T12 and L3. 3. Generalized lumbar spine degeneration as described.   CT abdomen/pelvis w/ contrast (02/11/22): FINDINGS: Lower chest: No acute abnormality.   Hepatobiliary: No cholelithiasis or biliary dilatation is noted. Multiple hepatic cysts are noted.   Pancreas: Unremarkable. No pancreatic ductal dilatation or surrounding inflammatory changes.   Spleen: Mild splenomegaly.   Adrenals/Urinary Tract: Adrenal glands appear normal. Nonobstructive right renal calculus is noted. Bilateral renal cysts are noted. No hydronephrosis or renal obstruction is noted. Urinary bladder is unremarkable.   Stomach/Bowel: Stomach is within normal limits. Appendix appears normal. No evidence of bowel wall thickening, distention, or inflammatory changes.   Vascular/Lymphatic: No significant vascular findings are present. No enlarged abdominal or pelvic lymph nodes.   Reproductive: Status post hysterectomy. No adnexal masses.   Other: No abdominal wall  hernia or abnormality. No abdominopelvic ascites.   Musculoskeletal: Old T12 and L3 compression fractures are noted. No acute osseous abnormality is noted. The right iliopsoas muscle is diffusely enlarged with ill-defined margins suggesting inflammation, edema or injury. No definite evidence of hemorrhage is noted.   IMPRESSION: Right iliopsoas muscle is diffusely enlarged with ill-defined margins suggesting inflammation or edema or possibly injury. No definite hematoma is noted.   Nonobstructive right renal calculus. No hydronephrosis or renal obstruction.   CT lumbar spine wo contrast (11/26/21): FINDINGS: Segmentation: There are five lumbar type vertebral bodies. The last full intervertebral disc space is labeled L5-S1.   Alignment: Normal alignment of the lumbar vertebral bodies. The facets are also normally aligned.   Vertebrae: Diffuse and fairly marked osteoporosis. Mild superior endplate depression of L3 is stable since the prior radiographs. No acute lumbar spine compression fracture. There is a compression deformity of T12. I do not see this for certain on the prior lumbar spine plain films. This is likely an acute compression fracture. No retropulsion or canal compromise.   Paraspinal and other soft tissues: No significant paraspinal findings. I do not see an obvious paraspinal hematoma at T12.   Disc levels:   T12-L1: No significant findings.   L1-2: Diffuse annular bulge and mild facet disease contributing to mild bilateral lateral recess stenosis.   L2-3: Moderate facet disease and bulging annulus with mild spinal and bilateral lateral recess stenosis.   L3-4: Diffuse bulging annulus, facet disease and ligamentum flavum thickening contributing to mild to moderate spinal and bilateral lateral recess stenosis.   L4-5: Diffuse bulging degenerated annulus, facet disease and ligamentum flavum thickening contributing to moderately severe  spinal and bilateral  lateral recess stenosis. No significant foraminal stenosis.   L5-S1: Bulging degenerated annulus and severe facet disease with mild spinal and bilateral lateral recess stenosis. No foraminal stenosis.   IMPRESSION: 1. Acute compression fracture of T12 without retropulsion or canal compromise. 2. Diffuse and fairly marked osteoporosis. 3. Multilevel spinal and lateral recess stenosis as discussed above.   CT head and cervical spine wo contrast (11/26/21): FINDINGS: CT HEAD FINDINGS   Brain: No evidence of acute infarction, hemorrhage, hydrocephalus, extra-axial collection or mass lesion/mass effect.   Vascular: Calcific atherosclerosis.   Skull: No acute fracture.   Sinuses/Orbits: Clear sinuses.  No acute orbital findings.   Other: No mastoid effusions.   CT CERVICAL SPINE FINDINGS   Alignment: No substantial sagittal subluxation.   Skull base and vertebrae: No evidence of acute fracture.   Soft tissues and spinal canal: No prevertebral fluid or swelling. No visible canal hematoma.   Disc levels:  Moderate multilevel degenerative change.   Upper chest: Visualized lung apices are clear.   IMPRESSION: 1. No evidence of acute intracranial abnormality. 2. No evidence of acute fracture or traumatic malalignment in the cervical spine.  Assessment/Plan:  This is Carmen Haney, a 89 y.o. female with GAD ab positive stiff person syndrome. She has significant leg pain. ***  Plan: *** -Continue valium 5 mg q6h PRN -Continue baclofen 5 mg TID  Return to clinic in ***  Total time spent reviewing records, interview, history/exam, documentation, and coordination of care on day of encounter:  *** min  Jacquelyne Balint, MD

## 2022-05-05 DIAGNOSIS — J439 Emphysema, unspecified: Secondary | ICD-10-CM | POA: Diagnosis not present

## 2022-05-05 DIAGNOSIS — N390 Urinary tract infection, site not specified: Secondary | ICD-10-CM | POA: Diagnosis not present

## 2022-05-05 DIAGNOSIS — I2510 Atherosclerotic heart disease of native coronary artery without angina pectoris: Secondary | ICD-10-CM | POA: Diagnosis not present

## 2022-05-05 DIAGNOSIS — I5032 Chronic diastolic (congestive) heart failure: Secondary | ICD-10-CM | POA: Diagnosis not present

## 2022-05-05 DIAGNOSIS — T83511A Infection and inflammatory reaction due to indwelling urethral catheter, initial encounter: Secondary | ICD-10-CM | POA: Diagnosis not present

## 2022-05-05 DIAGNOSIS — F322 Major depressive disorder, single episode, severe without psychotic features: Secondary | ICD-10-CM | POA: Diagnosis not present

## 2022-05-05 DIAGNOSIS — N183 Chronic kidney disease, stage 3 unspecified: Secondary | ICD-10-CM | POA: Diagnosis not present

## 2022-05-05 DIAGNOSIS — F419 Anxiety disorder, unspecified: Secondary | ICD-10-CM | POA: Diagnosis not present

## 2022-05-05 DIAGNOSIS — I130 Hypertensive heart and chronic kidney disease with heart failure and stage 1 through stage 4 chronic kidney disease, or unspecified chronic kidney disease: Secondary | ICD-10-CM | POA: Diagnosis not present

## 2022-05-06 ENCOUNTER — Ambulatory Visit: Payer: Medicare PPO | Admitting: Neurology

## 2022-05-06 ENCOUNTER — Encounter: Payer: Self-pay | Admitting: Neurology

## 2022-05-06 VITALS — BP 161/69 | HR 40 | Ht 62.0 in

## 2022-05-06 DIAGNOSIS — R29898 Other symptoms and signs involving the musculoskeletal system: Secondary | ICD-10-CM

## 2022-05-06 DIAGNOSIS — G2582 Stiff-man syndrome: Secondary | ICD-10-CM

## 2022-05-06 DIAGNOSIS — R760 Raised antibody titer: Secondary | ICD-10-CM | POA: Diagnosis not present

## 2022-05-06 DIAGNOSIS — R600 Localized edema: Secondary | ICD-10-CM | POA: Diagnosis not present

## 2022-05-06 DIAGNOSIS — M6289 Other specified disorders of muscle: Secondary | ICD-10-CM

## 2022-05-06 MED ORDER — DIAZEPAM 5 MG PO TABS
5.0000 mg | ORAL_TABLET | Freq: Four times a day (QID) | ORAL | 0 refills | Status: AC | PRN
Start: 2022-05-06 — End: 2022-05-13

## 2022-05-06 MED ORDER — BACLOFEN 5 MG PO TABS
5.0000 mg | ORAL_TABLET | Freq: Three times a day (TID) | ORAL | 1 refills | Status: DC | PRN
Start: 2022-05-06 — End: 2022-06-02

## 2022-05-06 NOTE — Patient Instructions (Addendum)
Continue valium 5 mg q6h PRN Continue baclofen 5 mg TID PRN Recommended patient and family discuss edema with cardiology You are to start PT soon, which I agree with  Return to clinic in 6 months  The physicians and staff at [facility] are committed to providing excellent care. You may receive a survey requesting feedback about your experience at our office. We strive to receive "very good" responses to the survey questions. If you feel that your experience would prevent you from giving the office a "very good " response, please contact our office to try to remedy the situation. We may be reached at [phone]. Thank you for taking the time out of your busy day to complete the survey.  Jacquelyne Balint, MD [facility]  Preventing Falls at [facility] are common, often dreaded events in the lives of older people. Aside from the obvious injuries and even death that may result, fall can cause wide-ranging consequences including loss of independence, mental decline, decreased activity and mobility. Younger people are also at risk of falling, especially those with chronic illnesses and fatigue.  Ways to reduce risk for falling Examine diet and medications. Warm foods and alcohol dilate blood vessels, which can lead to dizziness when standing. Sleep aids, antidepressants and pain medications can also increase the likelihood of a fall.  Get a vision exam. Poor vision, cataracts and glaucoma increase the chances of falling.  Check foot gear. Shoes should fit snugly and have a sturdy, nonskid sole and a broad, low heel  Participate in a physician-approved exercise program to build and maintain muscle strength and improve balance and coordination. Programs that use ankle weights or stretch bands are excellent for muscle-strengthening. Water aerobics programs and low-impact Tai Chi programs have also been shown to improve balance and coordination.  Increase vitamin D intake. Vitamin D  improves muscle strength and increases the amount of calcium the body is able to absorb and deposit in bones.  How to prevent falls from common hazards Floors - Remove all loose wires, cords, and throw rugs. Minimize clutter. Make sure rugs are anchored and smooth. Keep furniture in its usual place.  Chairs -- Use chairs with straight backs, armrests and firm seats. Add firm cushions to existing pieces to add height.  Bathroom - Install grab bars and non-skid tape in the tub or shower. Use a bathtub transfer bench or a shower chair with a back support Use an elevated toilet seat and/or safety rails to assist standing from a low surface. Do not use towel racks or bathroom tissue holders to help you stand.  Lighting - Make sure halls, stairways, and entrances are well-lit. Install a night light in your bathroom or hallway. Make sure there is a light switch at the top and bottom of the staircase. Turn lights on if you get up in the middle of the night. Make sure lamps or light switches are within reach of the bed if you have to get up during the night.  Kitchen - Install non-skid rubber mats near the sink and stove. Clean spills immediately. Store frequently used utensils, pots, pans between waist and eye level. This helps prevent reaching and bending. Sit when getting things out of lower cupboards.  Living room/ Bedrooms - Place furniture with wide spaces in between, giving enough room to move around. Establish a route through the living room that gives you something to hold onto as you walk.  Stairs - Make sure treads, rails, and rugs are secure.  Install a rail on both sides of the stairs. If stairs are a threat, it might be helpful to arrange most of your activities on the lower level to reduce the number of times you must climb the stairs.  Entrances and doorways - Install metal handles on the walls adjacent to the doorknobs of all doors to make it more secure as you travel through the  doorway.  Tips for maintaining balance Keep at least one hand free at all times. Try using a backpack or fanny pack to hold things rather than carrying them in your hands. Never carry objects in both hands when walking as this interferes with keeping your balance.  Attempt to swing both arms from front to back while walking. This might require a conscious effort if Parkinson's disease has diminished your movement. It will, however, help you to maintain balance and posture, and reduce fatigue.  Consciously lift your feet off of the ground when walking. Shuffling and dragging of the feet is a common culprit in losing your balance.  When trying to navigate turns, use a "U" technique of facing forward and making a wide turn, rather than pivoting sharply.  Try to stand with your feet shoulder-length apart. When your feet are close together for any length of time, you increase your risk of losing your balance and falling.  Do one thing at a time. Don't try to walk and accomplish another task, such as reading or looking around. The decrease in your automatic reflexes complicates motor function, so the less distraction, the better.  Do not wear rubber or gripping soled shoes, they might "catch" on the floor and cause tripping.  Move slowly when changing positions. Use deliberate, concentrated movements and, if needed, use a grab bar or walking aid. Count 15 seconds between each movement. For example, when rising from a seated position, wait 15 seconds after standing to begin walking.  If balance is a continuous problem, you might want to consider a walking aid such as a cane, walking stick, or walker. Once you've mastered walking with help, you might be ready to try it on your own again.

## 2022-05-07 DIAGNOSIS — F411 Generalized anxiety disorder: Secondary | ICD-10-CM | POA: Diagnosis not present

## 2022-05-07 DIAGNOSIS — I130 Hypertensive heart and chronic kidney disease with heart failure and stage 1 through stage 4 chronic kidney disease, or unspecified chronic kidney disease: Secondary | ICD-10-CM | POA: Diagnosis not present

## 2022-05-07 DIAGNOSIS — F331 Major depressive disorder, recurrent, moderate: Secondary | ICD-10-CM | POA: Diagnosis not present

## 2022-05-07 DIAGNOSIS — I5032 Chronic diastolic (congestive) heart failure: Secondary | ICD-10-CM | POA: Diagnosis not present

## 2022-05-07 DIAGNOSIS — N390 Urinary tract infection, site not specified: Secondary | ICD-10-CM | POA: Diagnosis not present

## 2022-05-07 DIAGNOSIS — F419 Anxiety disorder, unspecified: Secondary | ICD-10-CM | POA: Diagnosis not present

## 2022-05-07 DIAGNOSIS — T83511A Infection and inflammatory reaction due to indwelling urethral catheter, initial encounter: Secondary | ICD-10-CM | POA: Diagnosis not present

## 2022-05-07 DIAGNOSIS — F322 Major depressive disorder, single episode, severe without psychotic features: Secondary | ICD-10-CM | POA: Diagnosis not present

## 2022-05-07 DIAGNOSIS — I2510 Atherosclerotic heart disease of native coronary artery without angina pectoris: Secondary | ICD-10-CM | POA: Diagnosis not present

## 2022-05-07 DIAGNOSIS — J439 Emphysema, unspecified: Secondary | ICD-10-CM | POA: Diagnosis not present

## 2022-05-07 DIAGNOSIS — N183 Chronic kidney disease, stage 3 unspecified: Secondary | ICD-10-CM | POA: Diagnosis not present

## 2022-05-08 DIAGNOSIS — T83511A Infection and inflammatory reaction due to indwelling urethral catheter, initial encounter: Secondary | ICD-10-CM | POA: Diagnosis not present

## 2022-05-08 DIAGNOSIS — F419 Anxiety disorder, unspecified: Secondary | ICD-10-CM | POA: Diagnosis not present

## 2022-05-08 DIAGNOSIS — I5032 Chronic diastolic (congestive) heart failure: Secondary | ICD-10-CM | POA: Diagnosis not present

## 2022-05-08 DIAGNOSIS — I2510 Atherosclerotic heart disease of native coronary artery without angina pectoris: Secondary | ICD-10-CM | POA: Diagnosis not present

## 2022-05-08 DIAGNOSIS — J439 Emphysema, unspecified: Secondary | ICD-10-CM | POA: Diagnosis not present

## 2022-05-08 DIAGNOSIS — N390 Urinary tract infection, site not specified: Secondary | ICD-10-CM | POA: Diagnosis not present

## 2022-05-08 DIAGNOSIS — I130 Hypertensive heart and chronic kidney disease with heart failure and stage 1 through stage 4 chronic kidney disease, or unspecified chronic kidney disease: Secondary | ICD-10-CM | POA: Diagnosis not present

## 2022-05-08 DIAGNOSIS — N183 Chronic kidney disease, stage 3 unspecified: Secondary | ICD-10-CM | POA: Diagnosis not present

## 2022-05-08 DIAGNOSIS — F322 Major depressive disorder, single episode, severe without psychotic features: Secondary | ICD-10-CM | POA: Diagnosis not present

## 2022-05-10 ENCOUNTER — Emergency Department (HOSPITAL_COMMUNITY)
Admission: EM | Admit: 2022-05-10 | Discharge: 2022-05-10 | Disposition: A | Discharge status: Home / Self Care | Payer: Medicare PPO | Attending: Emergency Medicine | Admitting: Emergency Medicine

## 2022-05-10 ENCOUNTER — Other Ambulatory Visit: Payer: Self-pay

## 2022-05-10 ENCOUNTER — Encounter (HOSPITAL_COMMUNITY): Payer: Self-pay

## 2022-05-10 DIAGNOSIS — R39198 Other difficulties with micturition: Secondary | ICD-10-CM | POA: Diagnosis not present

## 2022-05-10 DIAGNOSIS — F989 Unspecified behavioral and emotional disorders with onset usually occurring in childhood and adolescence: Secondary | ICD-10-CM | POA: Diagnosis not present

## 2022-05-10 DIAGNOSIS — Z7901 Long term (current) use of anticoagulants: Secondary | ICD-10-CM | POA: Diagnosis not present

## 2022-05-10 DIAGNOSIS — I2510 Atherosclerotic heart disease of native coronary artery without angina pectoris: Secondary | ICD-10-CM | POA: Diagnosis not present

## 2022-05-10 DIAGNOSIS — T83091A Other mechanical complication of indwelling urethral catheter, initial encounter: Secondary | ICD-10-CM | POA: Diagnosis not present

## 2022-05-10 DIAGNOSIS — T83098A Other mechanical complication of other indwelling urethral catheter, initial encounter: Secondary | ICD-10-CM | POA: Diagnosis not present

## 2022-05-10 DIAGNOSIS — Z79899 Other long term (current) drug therapy: Secondary | ICD-10-CM | POA: Insufficient documentation

## 2022-05-10 DIAGNOSIS — F411 Generalized anxiety disorder: Secondary | ICD-10-CM | POA: Diagnosis not present

## 2022-05-10 DIAGNOSIS — R4689 Other symptoms and signs involving appearance and behavior: Secondary | ICD-10-CM

## 2022-05-10 DIAGNOSIS — I110 Hypertensive heart disease with heart failure: Secondary | ICD-10-CM | POA: Insufficient documentation

## 2022-05-10 DIAGNOSIS — R531 Weakness: Secondary | ICD-10-CM | POA: Diagnosis not present

## 2022-05-10 DIAGNOSIS — R0902 Hypoxemia: Secondary | ICD-10-CM | POA: Diagnosis not present

## 2022-05-10 DIAGNOSIS — Z853 Personal history of malignant neoplasm of breast: Secondary | ICD-10-CM | POA: Diagnosis not present

## 2022-05-10 DIAGNOSIS — I5032 Chronic diastolic (congestive) heart failure: Secondary | ICD-10-CM | POA: Diagnosis not present

## 2022-05-10 DIAGNOSIS — Z91040 Latex allergy status: Secondary | ICD-10-CM | POA: Diagnosis not present

## 2022-05-10 DIAGNOSIS — Z7401 Bed confinement status: Secondary | ICD-10-CM | POA: Diagnosis not present

## 2022-05-10 DIAGNOSIS — T839XXA Unspecified complication of genitourinary prosthetic device, implant and graft, initial encounter: Secondary | ICD-10-CM

## 2022-05-10 DIAGNOSIS — N390 Urinary tract infection, site not specified: Secondary | ICD-10-CM | POA: Diagnosis not present

## 2022-05-10 LAB — URINALYSIS, ROUTINE W REFLEX MICROSCOPIC
Bilirubin Urine: NEGATIVE
Glucose, UA: NEGATIVE mg/dL
Ketones, ur: NEGATIVE mg/dL
Nitrite: NEGATIVE
Protein, ur: 30 mg/dL — AB
Specific Gravity, Urine: 1.009 (ref 1.005–1.030)
WBC, UA: >50 WBC/hpf (ref 0–5)
pH: 6 (ref 5.0–8.0)

## 2022-05-10 LAB — BASIC METABOLIC PANEL
Anion gap: 11 (ref 5–15)
BUN: 11 mg/dL (ref 8–23)
CO2: 28 mmol/L (ref 22–32)
Calcium: 8.4 mg/dL — ABNORMAL LOW (ref 8.9–10.3)
Chloride: 93 mmol/L — ABNORMAL LOW (ref 98–111)
Creatinine, Ser: 0.58 mg/dL (ref 0.44–1.00)
GFR, Estimated: >60 mL/min (ref >60)
Glucose, Bld: 102 mg/dL — ABNORMAL HIGH (ref 70–99)
Potassium: 4.2 mmol/L (ref 3.5–5.1)
Sodium: 132 mmol/L — ABNORMAL LOW (ref 135–145)

## 2022-05-10 LAB — CBC
HCT: 35.5 % — ABNORMAL LOW (ref 36.0–46.0)
Hemoglobin: 11.5 g/dL — ABNORMAL LOW (ref 12.0–15.0)
MCH: 30.2 pg (ref 26.0–34.0)
MCHC: 32.4 g/dL (ref 30.0–36.0)
MCV: 93.2 fL (ref 80.0–100.0)
Platelets: 269 10*3/uL (ref 150–400)
RBC: 3.81 MIL/uL — ABNORMAL LOW (ref 3.87–5.11)
RDW: 13.7 % (ref 11.5–15.5)
WBC: 5.7 10*3/uL (ref 4.0–10.5)
nRBC: 0 % (ref 0.0–0.2)

## 2022-05-10 MED ORDER — SODIUM CHLORIDE 0.9 % IV BOLUS
1000.0000 mL | Freq: Once | INTRAVENOUS | Status: AC
  Administered 2022-05-10: 1000 mL via INTRAVENOUS

## 2022-05-10 MED ORDER — BACLOFEN 5 MG PO TABS: 5.0000 mg | ORAL_TABLET | Freq: Three times a day (TID) | ORAL | Status: DC | PRN

## 2022-05-10 MED ORDER — DIAZEPAM 5 MG PO TABS
5.0000 mg | ORAL_TABLET | Freq: Four times a day (QID) | ORAL | Status: DC | PRN
  Filled 2022-05-10: qty 1

## 2022-05-10 MED ORDER — BACLOFEN 10 MG PO TABS
5.0000 mg | ORAL_TABLET | Freq: Three times a day (TID) | ORAL | Status: DC | PRN
  Filled 2022-05-10: qty 1

## 2022-05-10 MED ORDER — SODIUM CHLORIDE 0.9 % IV SOLN
1.0000 g | Freq: Once | INTRAVENOUS | Status: AC
  Administered 2022-05-10: 1 g via INTRAVENOUS
  Filled 2022-05-10: qty 10

## 2022-05-10 NOTE — Discharge Summary (Signed)
[facility] ED Discharge  05/10/2022 6:34 PM Carmen Haney  MRN:  [id number]  Principal Problem: Generalized anxiety disorder Discharge Diagnoses: Principal Problem:   Generalized anxiety disorder  Clinical Impression:  Final diagnoses:  Problem with Foley catheter, initial encounter [facility])   Subjective: 89 years old seen as a consult for irritability, anxiety  and agitation with previous hx of anxiety on Cymbalta and Rexulti brought to the ER for evaluation of reported urine retention.  Patient uses Foley catheter and believes that her  Catheter was clogged.  Daughter is her care giver and lives with her.  Daughter needed Psychiatry to evaluate for increase in anxiety, agitation and irritability.  Patient was seen, chart reviewed and daughter called for collateral information.  Seen was a female, 89 years old  well groomed, alert and oriented x4.   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  Patient reports that she is anxious because of this diagnosis and she is worried that soon she will not be able to function.  She also is not using Valium as prescribed because she is afraid Valium will make her cognitively impaired.   Collateral information from [name] is that her mother is not responding to Cymbalta 40 mg and Rexulti 0.25 for anxiety and agitation respectively.  Carmen Haney reports that her mother is becoming more anxious, agitated and irritable.  She does not trust her and her sister and accuses them of wanting to hurt her.  She reports that her mother is now needing 24/7 care including changing and moving her.  She went to a rehab facility and did not last long there and she came back home.  Carmen Haney denies family hx of Dementia stating that her mother's Memory and cognition  remains very strong.  She wanted suggestions regarding Cymbalta and Rexulti.  Provider advised that Medication changes  is better done in the Clinic  and since patient has a Therapist, sports and counselor the best thing is to call and make appointment for her mother.  Carmen Haney is in agreement and added she was not looking for Medication changes in the ER. We discussed her mother's UA done this afternoon and that since she is using Foley Catheter need to be evaluated for possible UTI or contamination.  Provider also spoke with PA Lorin who is seeing patient regarding the urine result and she plans to take a look at the result..  Patient denies SI/HI/AVH and no mention of Paranoia.  Patient is Psychiatrically cleared and MS Carmen Haney agrees to take her mother to her outpatient Psychiatrist as soon as possible for evaluation and medication management. ED Assessment Time Calculation: Start Time: 1752 Stop Time: 1816 Total Time in Minutes (Assessment Completion): 24   Past Psychiatric History: Anxiety  Past Medical History:  Past Medical History:  Diagnosis Date   ACE-inhibitor cough    Breast CA (HCC) 2010   s/p lumpectomy & radiation   CAD (coronary artery disease) 07/1998   s/p cath in 2000. 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    Thyroid  disease    Vertebral fracture, pathological     Past Surgical History:  Procedure Laterality Date   BREAST BIOPSY Left    BREAST LUMPECTOMY Right March 2010   BREAST LUMPECTOMY WITH RADIOACTIVE SEED LOCALIZATION Left 09/20/2014   Procedure: LEFT BREAST LUMPECTOMY WITH RADIOACTIVE SEED LOCALIZATION;  Surgeon: [name], MD;  Location: [facility];  Service: General;  Laterality: Left;   CARPAL TUNNEL RELEASE Right    CESAREAN SECTION     EYE SURGERY      Right Eye   Skin Cancer Removed from Right Forearm     TOTAL ABDOMINAL HYSTERECTOMY     Family History:  Family History  Problem Relation Age of Onset   Coronary artery disease Mother    Hypertension Mother    Melanoma Mother    Cancer Mother 66       breast   Breast cancer Mother    Cancer Father 60       prostate   Cancer Sister 53       breast   Breast cancer Sister    Cancer Maternal Aunt 32       breast   Cancer Sister 66       breast   Breast cancer Sister    Breast cancer Daughter 27   Family Psychiatric  History: Denies Social History:  Social History   Substance and Sexual Activity  Alcohol Use No   Alcohol/week: 0.0 standard drinks of alcohol     Social History   Substance and Sexual Activity  Drug Use Never    Social History   Socioeconomic History   Marital status: Widowed    Spouse name: Carmen Haney   Number of children: Not on file   Years of education: Not on file   Highest education level: Not on file  Occupational History   Occupation: Retired from [employer]    Employer: RETIRED  Tobacco Use   Smoking status: Former    Types: Cigarettes    Quit date: 01/13/1963    Years since quitting: 59.3   Smokeless tobacco: Never   Tobacco comments:    stopped in 1970's  Vaping Use   Vaping Use: Never used  Substance and Sexual Activity   Alcohol use: No    Alcohol/week: 0.0 standard drinks of alcohol   Drug use: Never   Sexual activity: Not on file  Other Topics Concern   Not on file  Social History Narrative   Are you right handed or left handed? right   Are you currently employed ?    What is your current occupation?   Do you live at home alone?   Who lives with you? Daughter and husband   What type of home do you live in: 1 story or 2 story? two    Caffeine 2 cups a day   Social Determinants of Health   Financial Resource Strain: Low Risk  (02/23/2022)   Overall Financial Resource Strain (CARDIA)    Difficulty of Paying Living Expenses: Not  hard at all  Food Insecurity: No Food Insecurity (03/15/2022)   Hunger Vital Sign    Worried About Running Out of Food in the Last Year: Never true    Ran Out of Food in the Last Year: Never true  Transportation Needs: No Transportation Needs (03/15/2022)   PRAPARE - Administrator, Civil Service (Medical): No    Lack of Transportation (Non-Medical): No  Physical Activity: Inactive (02/23/2022)   Exercise  Vital Sign    Days of Exercise per Week: 0 days    Minutes of Exercise per Session: 0 min  Stress: Stress Concern Present (02/23/2022)   [employer] of Occupational Health - Occupational Stress Questionnaire    Feeling of Stress : Rather much  Social Connections: Socially Isolated (02/23/2022)   Social Connection and Isolation Panel [NHANES]    Frequency of Communication with Friends and Family: Three times a week    Frequency of Social Gatherings with Friends and Family: Twice a week    Attends Religious Services: Never    Database administrator or Organizations: No    Attends Banker Meetings: Never    Marital Status: Widowed    Tobacco Cessation:  N/A, patient does not currently use tobacco products  Current Medications: Current Facility-Administered Medications  Medication Dose Route Frequency Provider Last Rate Last Admin   baclofen (LIORESAL) tablet 5 mg  5 mg Oral TID PRN Gloris Manchester, MD       diazepam (VALIUM) tablet 5 mg  5 mg Oral Q6H PRN Roemhildt, Lorin T, PA-C       Current Outpatient Medications  Medication Sig Dispense Refill   acetaminophen (TYLENOL) 500 MG tablet Take 1,000 mg by mouth in the morning and at bedtime.     apixaban (ELIQUIS) 5 MG TABS tablet Take 1 tablet (5 mg total) by mouth 2 (two) times daily. 180 tablet 3   Baclofen 5 MG TABS Take 1 tablet (5 mg total) by mouth 3 (three) times daily as needed. (Patient taking differently: Take 5 mg by mouth 3 (three) times daily as needed (muscle spasms).) 90 tablet 1   brexpiprazole  (REXULTI) 0.25 MG TABS tablet Take 1 tablet (0.25 mg total) by mouth daily. 60 tablet 0   Calcium-Phosphorus-Vitamin D (CALCIUM/D3 ADULT GUMMIES PO) Take 500 mg by mouth in the morning.     diazepam (VALIUM) 5 MG tablet Take 1 tablet (5 mg total) by mouth every 6 (six) hours as needed for up to 7 days for muscle spasms. 28 tablet 0   docusate sodium (COLACE) 100 MG capsule Take 1 capsule (100 mg total) by mouth 2 (two) times daily. (Patient taking differently: Take 100 mg by mouth daily as needed for mild constipation.) 10 capsule 0   DULoxetine 40 MG CPEP Take 1 capsule (40 mg total) by mouth at bedtime. (Patient taking differently: Take 20 mg by mouth daily.)  3   furosemide (LASIX) 20 MG tablet Take 20 mg by mouth daily.     levothyroxine (SYNTHROID) 150 MCG tablet Take 1 tablet (150 mcg total) by mouth daily before breakfast. 90 tablet 1   metoprolol tartrate (LOPRESSOR) 25 MG tablet Take 0.5 tablets (12.5 mg total) by mouth 2 (two) times daily. 90 tablet 3   polyethylene glycol (MIRALAX / GLYCOLAX) 17 g packet Take 17 g by mouth daily as needed for mild constipation.     Probiotic Product (PROBIOTIC GUMMIES PO) Take 2 tablets by mouth in the morning.     nystatin-triamcinolone ointment (MYCOLOG) Apply 1 Application topically 2 (two) times daily. (Patient not taking: Reported on 05/10/2022) 30 g 0   PTA Medications: (Not in a hospital admission)   [place] Scale:  Flowsheet Row ED from 05/10/2022 in [facility] Department at [facility] ED to Hosp-Admission (Discharged) from 04/26/2022 in [facility] ED from 04/18/2022 in [facility] Department at [facility]  C-SSRS RISK CATEGORY No Risk No  Risk No Risk       Musculoskeletal: Strength & Muscle Tone:  seen lying down in bed  Gait & Station:  seen in bed  Patient leans:  see above  Psychiatric Specialty Exam: Presentation  General Appearance:  Casual; Neat  Eye  Contact: Good  Speech: Clear and Coherent; Normal Rate  Speech Volume: Normal  Handedness: Right   Mood and Affect  Mood: Anxious; Irritable  Affect: Congruent   Thought Process  Thought Processes: Coherent; Linear; Goal Directed  Descriptions of Associations:Intact  Orientation:Partial  Thought Content:Logical  History of Schizophrenia/Schizoaffective disorder:No data recorded Duration of Psychotic Symptoms:No data recorded Hallucinations:Hallucinations: None  Ideas of Reference:None  Suicidal Thoughts:Suicidal Thoughts: No  Homicidal Thoughts:Homicidal Thoughts: No   Sensorium  Memory: Immediate Good; Recent Good; Remote Fair  Judgment: Intact  Insight: Present   Executive Functions  Concentration: Good  Attention Span: Good  Recall: Good  Fund of Knowledge: Good  Language: Good   Psychomotor Activity  Psychomotor Activity: Psychomotor Activity: Normal   Assets  Assets: Communication Skills; Desire for Improvement; Housing; Health and safety inspector; Resilience; Social Support   Sleep  Sleep: Sleep: Fair    Physical Exam: Physical Exam Vitals and nursing note reviewed.  Constitutional:      Appearance: Normal appearance.  HENT:     Head: Normocephalic and atraumatic.     Nose: Nose normal.  Cardiovascular:     Rate and Rhythm: Normal rate and regular rhythm.  Pulmonary:     Effort: Pulmonary effort is normal.  Genitourinary:    Comments: Uses Foley Catheter, plan is for suprapubic Catheter to be placed soon Musculoskeletal:     Cervical back: Normal range of motion.     Comments: Poor mobility, needs assistance with ADLS  Skin:    General: Skin is warm and dry.  Neurological:     Mental Status: She is alert and oriented to person, place, and time.  Psychiatric:        Attention and Perception: Attention and perception normal.        Mood and Affect: Mood is anxious.        Speech: Speech normal.         Behavior: Behavior is cooperative.        Thought Content: Thought content normal.        Cognition and Memory: Cognition and memory normal.    Review of Systems  Constitutional: Negative.   HENT: Negative.    Eyes: Negative.   Respiratory: Negative.    Cardiovascular: Negative.   Gastrointestinal: Negative.   Genitourinary:        Utilizes Foley Catheter  Musculoskeletal:        Poor mobility-hx sps.  Skin: Negative.   Neurological:  Positive for weakness.       Hx SPS -spasms   Psychiatric/Behavioral:  The patient is nervous/anxious.    Blood pressure (!) 116/105, pulse 83, temperature 98.5 F (36.9 C), temperature source Oral, resp. rate 16, height 5\' 2"  (1.575 m), weight 91 kg, SpO2 98 %. Body mass index is 36.69 kg/m.   Demographic Factors:  Age 7 or older, Divorced or widowed, and Caucasian  Loss Factors: Loss of significant relationship and Decline in physical health  Historical Factors: NA  Risk Reduction Factors:   Living with another person, especially a relative, Positive social support, and Positive therapeutic relationship  Continued Clinical Symptoms:  Severe Anxiety and/or Agitation  Cognitive Features That Contribute To Risk:  None    Suicide Risk:  Minimal: No identifiable suicidal ideation.  Patients presenting with no risk factors but with morbid ruminations; may be classified as minimal risk based on the severity of the depressive symptoms    Plan Of Care/Follow-up recommendations:  Activity:  as tolerated Diet:  Regular  Medical Decision Making: Patient is Psychiatrically cleared.  Medication changes to be made in the outpatient setting.  Daughter to take Patient to her outpatient Psychiatrist for medication management.  She denies SI/HI/AVH   Problem 1 Generalized anxiety disorder  Disposition:Psychiatrically cleared.  Earney Navy, NP-PMHNP-BC 05/10/2022, 6:34 PM

## 2022-05-10 NOTE — ED Triage Notes (Signed)
Patient BIB [facility] from home. Patient said at 8:30pm last night, her foley catheter clogged, would not drain properly. Today was only of urine in her bag, but it usually has in the morning when she wakes up.

## 2022-05-10 NOTE — Discharge Instructions (Addendum)
You were seen in the ER for issue with your foley catheter.  We have flushed it and it is draining well. Your blood work is reassuring. It is my opinion that you would benefit greatly from getting a suprapubic catheter. You need to call the urologist first thing tomorrow morning to get this scheduled.  Please follow up with your psychiatrist regarding your medications. Please follow up with the neurologist regarding any concerns you have about the valium and muscle relaxers.  Continue to monitor how you're doing and return to the ER for new or worsening symptoms.  There is a mole behind your left ear that I think you need to have evaluated by a dermatologist. It could be a benign lesion, but I think it should be biopsied just in case.

## 2022-05-10 NOTE — Care Management (Addendum)
Consult regarding possible placement Patient was discharged on 4/18 with home health services  PT, OT, aide, and RN with [facility]. Upon DC she stayed  with her daughter, [name]. Prior to this it was documented she was at ALF. Last PT assessment while in the hospital indicated home health. Discussed with team about next steps. 1620 Spoke to daughter regarding needs and to get an understanding of where the patient would be best suited. She feels like she has a good handle on home, she does cite some issues with hoyer and wheelchair, which she is working with OT on. The biggest challenge is the foley, she has been in the ED 7 times since the beginning of the year with issues and had been refusing to get a recommended SP catheter. She thinks she is agreeable now,  the patient is also having some MH changes, she has a history of depression. They are hiring a aide to have during the evening hours. Her and her sister are primary caretakers. She would like to be present if psych is evaluating her to provide information as her mother has had some aggressive behaviors.  Updated provider and RN with conversation, psych services has just been in to see  the patient and will be calling her daughter.  1723 Daughter [name]  called to state she forgot to tell the provider that her mom had a dark spot on her head above her ear and would like it looked at if possible. Message sent to provider.  Patient will need to be transported home by [facility] if discharged.

## 2022-05-10 NOTE — ED Notes (Signed)
Pt to be discharged home. Papers reviewed with pt, in stable condition waiting on PTAR.

## 2022-05-10 NOTE — ED Provider Notes (Signed)
[facility] EMERGENCY DEPARTMENT AT [facility] Provider Note   CSN: [id number] Arrival date & time: 05/10/22  1002     History  Chief Complaint  Patient presents with   Foley Catheter Problem    Carmen Haney is a 89 y.o. female with history of stiff person syndrome, CAD, HTN, breast cancer, HLD, thyroid disease, paroxysmal afib, chronic diastolic HF, chronic indwelling foley who presents to the ER complaining of issues with her foley catheter.  Wheelchair and bedbound at baseline.  Patient states that starting around 8:30 PM last night, her Foley catheter was clogged and was not draining properly.  This morning when she woke up she is only about 200 mL of urine in her bag, but he normally has about 800 mL when she wakes up.  HPI     Home Medications Prior to Admission medications   Medication Sig Start Date End Date Taking? Authorizing Provider  acetaminophen (TYLENOL) 500 MG tablet Take 1,000 mg by mouth in the morning and at bedtime.   Yes [provider]  apixaban (ELIQUIS) 5 MG TABS tablet Take 1 tablet (5 mg total) by mouth 2 (two) times daily. 02/16/21  Yes Duke, Roe Rutherford, PA  Baclofen 5 MG TABS Take 1 tablet (5 mg total) by mouth 3 (three) times daily as needed. Patient taking differently: Take 5 mg by mouth 3 (three) times daily as needed (muscle spasms). 05/06/22 07/05/22 Yes Antony Madura, MD  brexpiprazole (REXULTI) 0.25 MG TABS tablet Take 1 tablet (0.25 mg total) by mouth daily. 02/05/22  Yes Fargo, Amy E, NP  Calcium-Phosphorus-Vitamin D (CALCIUM/D3 ADULT GUMMIES PO) Take 500 mg by mouth in the morning.   Yes [provider]  diazepam (VALIUM) 5 MG tablet Take 1 tablet (5 mg total) by mouth every 6 (six) hours as needed for up to 7 days for muscle spasms. 05/06/22 05/13/22 Yes Antony Madura, MD  docusate sodium (COLACE) 100 MG capsule Take 1 capsule (100 mg total) by mouth 2 (two) times daily. Patient taking differently: Take 100 mg by mouth  daily as needed for mild constipation. 12/01/21  Yes [name], MD  DULoxetine 40 MG CPEP Take 1 capsule (40 mg total) by mouth at bedtime. Patient taking differently: Take 20 mg by mouth daily. 03/19/22  Yes Danford, Earl Lites, MD  furosemide (LASIX) 20 MG tablet Take 20 mg by mouth daily.   Yes [provider]  levothyroxine (SYNTHROID) 150 MCG tablet Take 1 tablet (150 mcg total) by mouth daily before breakfast. 03/13/22  Yes Fargo, Amy E, NP  metoprolol tartrate (LOPRESSOR) 25 MG tablet Take 0.5 tablets (12.5 mg total) by mouth 2 (two) times daily. 02/16/21  Yes Duke, Roe Rutherford, PA  polyethylene glycol (MIRALAX / GLYCOLAX) 17 g packet Take 17 g by mouth daily as needed for mild constipation.   Yes [provider]  Probiotic Product (PROBIOTIC GUMMIES PO) Take 2 tablets by mouth in the morning.   Yes [provider]  nystatin-triamcinolone ointment (MYCOLOG) Apply 1 Application topically 2 (two) times daily. Patient not taking: Reported on 05/10/2022 04/26/22   [name], MD  olmesartan (BENICAR) 20 MG tablet Take 20 mg by mouth daily.    04/25/10  [provider]      Allergies    Ace inhibitors and Latex    Review of Systems   Review of Systems  Genitourinary:  Positive for difficulty urinating.  All other systems reviewed and are negative.  Physical Exam Updated Vital Signs BP 116/82 (BP Location: Left Wrist)   Pulse 81   Temp 98.8 F (37.1 C) (Oral)   Resp 18   Ht 5\' 2"  (1.575 m)   Wt 91 kg   SpO2 92%   BMI 36.69 kg/m  Physical Exam Vitals and nursing note reviewed.  Constitutional:      Appearance: Normal appearance.  HENT:     Head: Normocephalic and atraumatic.  Eyes:     Conjunctiva/sclera: Conjunctivae normal.  Cardiovascular:     Rate and Rhythm: Normal rate and regular rhythm.  Pulmonary:     Effort: Pulmonary effort is normal. No respiratory distress.     Breath sounds: Normal breath sounds.  Abdominal:      General: There is no distension.     Palpations: Abdomen is soft.     Tenderness: There is no abdominal tenderness.  Genitourinary:    Comments: Foley catheter draining light yellow urine Skin:    General: Skin is warm and dry.  Neurological:     General: No focal deficit present.     Mental Status: She is alert.     ED Results / Procedures / Treatments   Labs (all labs ordered are listed, but only abnormal results are displayed) Labs Reviewed  CBC - Abnormal; Notable for the following components:      Result Value   RBC 3.81 (*)    Hemoglobin 11.5 (*)    HCT 35.5 (*)    All other components within normal limits  BASIC METABOLIC PANEL - Abnormal; Notable for the following components:   Sodium 132 (*)    Chloride 93 (*)    Glucose, Bld 102 (*)    Calcium 8.4 (*)    All other components within normal limits  URINALYSIS, ROUTINE W REFLEX MICROSCOPIC - Abnormal; Notable for the following components:   APPearance HAZY (*)    Hgb urine dipstick MODERATE (*)    Protein, ur 30 (*)    Leukocytes,Ua LARGE (*)    Bacteria, UA RARE (*)    All other components within normal limits    EKG None  Radiology No results found.  Procedures Procedures    Medications Ordered in ED Medications  diazepam (VALIUM) tablet 5 mg (has no administration in time range)  baclofen (LIORESAL) tablet 5 mg (has no administration in time range)  sodium chloride 0.9 % bolus 1,000 mL (0 mLs Intravenous Stopped 05/10/22 1810)  cefTRIAXone (ROCEPHIN) 1 g in sodium chloride 0.9 % 100 mL IVPB (0 g Intravenous Stopped 05/10/22 1914)    ED Course/ Medical Decision Making/ A&P Clinical Course as of 05/10/22 2017  Sun May 10, 2022  1045 Bladder scan 0mL. Catheter irrigated w/18mL saline. Urine returned from bladder via foley was clear and >84mL. [LR]  1100 Discussed with daughter at bedside who has been worried about capacity to care for patient at home. Reports significant issue with  hygiene/toileting due to patient's underlying stiff body syndrome, and recurrent foley catheter issues. They are interested in pursuing suprapubic catheter placement at this time and are requesting psychiatric consultation. Report patient has been more agitated and having behavior changes over the past few months.  [LR]  1515 Discussed with on call urologist Dr Marlou Porch who does not believe patient needs suprapubic catheter placement inpatient. He recommends family follow up with [facility] and IR outpatient.  [LR]  1647 Discussed with behavioral health provider Dahlia Byes NP who agrees patient does not meet psychiatric criteria for  admission. She will call daughter to get collateral and suggest medication changes to help with mood changes.  [LR]    Clinical Course User Index [LR] Kasaundra Fahrney, Lora Paula, PA-C                             Medical Decision Making Amount and/or Complexity of Data Reviewed Labs: ordered.  Risk Prescription drug management.   This patient is a 89 y.o. female  who presents to the ED for concern of issue with foley catheter.   Differential diagnoses prior to evaluation: The emergent differential diagnosis includes, but is not limited to, Foley catheter blockage, UTI. This is not an exhaustive differential.   Past Medical History / Co-morbidities: stiff person syndrome, CAD, HTN, breast cancer, HLD, thyroid disease, paroxysmal afib, chronic diastolic HF, chronic indwelling foley  Additional history: Chart reviewed. Pertinent results include: Discharged om 4/18 after admission for UTI due to indwelling foley. During most recent admission last week, it was discussed with urology to undergo IR suprapubic catheter placement, but patient and family chose to decline this. Urine cultures grew E. Coli, sensitive to bactrim. Finished ABX on 4/20.   Physical Exam: Physical exam performed. The pertinent findings include: Normal vital signs, no acute distress.   Abdomen soft and nontender.  Foley catheter draining light yellow urine.  Lab Tests/Imaging studies: I personally interpreted labs/imaging and the pertinent results include: No leukocytosis, hemoglobin at baseline.  BMP grossly unremarkable.  Urinalysis with moderate hemoglobin, large leukocytes, >50 WBCs, and rare bacteria, however likely contaminated with yeast and mucus.   Medications: I ordered medication including IVF, antibiotics, placed orders for home baclofen and valium as needed.  I have reviewed the patients home medicines and have made adjustments as needed.  Consultations obtained: I consulted with urologist Dr. Marlou Porch who agreed patient would benefit from suprapubic catheter.  He also agrees this does not need to be done inpatient, and states this needs to be done with IR as an outpatient procedure.  I consulted with transition of care who was able to speak to the family about their expectations for her care.  They do not want to go through placing at a skilled nursing facility at this time, and were interested in more home health options.  I consulted with behavioral health, who was able to come evaluate the patient.  They did not feel strongly about changing her behavioral health medications at this time, and cleared her from a psychiatric perspective.   Disposition: After consideration of the diagnostic results and the patients response to treatment, I feel that emergency department workup does not suggest an emergent condition requiring admission or immediate intervention beyond what has been performed at this time. The plan is: Discharge home with recommendation to follow-up with urology for suprapubic catheter placement.  Stressed this with both the patient and her family.. The patient is safe for discharge and has been instructed to return immediately for worsening symptoms, change in symptoms or any other concerns.  Final Clinical Impression(s) / ED Diagnoses Final  diagnoses:  Problem with Foley catheter, initial encounter (HCC)  Behavioral change    Rx / DC Orders ED Discharge Orders     None      Portions of this report may have been transcribed using voice recognition software. Every effort was made to ensure accuracy; however, inadvertent computerized transcription errors may be present.    Su Monks, Cordelia Poche 05/10/22 2017  Gloris Manchester, MD 05/11/22 [phone]

## 2022-05-10 NOTE — ED Notes (Signed)
Bladder scan 0mL. Catheter irrigated w/21mL saline. Urine returned from bladder via foley was clear and >51mL.

## 2022-05-11 ENCOUNTER — Telehealth: Payer: Self-pay

## 2022-05-11 NOTE — Telephone Encounter (Signed)
Gracie with [facility] called requesting verbal orders for Home helath PT 1 time a week for 8 weeks   Verbal orders were given.

## 2022-05-12 ENCOUNTER — Other Ambulatory Visit (HOSPITAL_COMMUNITY): Payer: Self-pay | Admitting: Urology

## 2022-05-12 ENCOUNTER — Telehealth: Payer: Self-pay | Admitting: *Deleted

## 2022-05-12 DIAGNOSIS — R760 Raised antibody titer: Secondary | ICD-10-CM | POA: Diagnosis not present

## 2022-05-12 DIAGNOSIS — I4891 Unspecified atrial fibrillation: Secondary | ICD-10-CM | POA: Diagnosis not present

## 2022-05-12 DIAGNOSIS — R29898 Other symptoms and signs involving the musculoskeletal system: Secondary | ICD-10-CM | POA: Diagnosis not present

## 2022-05-12 DIAGNOSIS — R339 Retention of urine, unspecified: Secondary | ICD-10-CM

## 2022-05-12 DIAGNOSIS — G2582 Stiff-man syndrome: Secondary | ICD-10-CM | POA: Diagnosis not present

## 2022-05-12 NOTE — Telephone Encounter (Signed)
   Pre-operative Risk Assessment    Patient Name: Carmen Haney  DOB: 1930-11-06 MRN: [id number]      Request for Surgical Clearance    Procedure:   SP TUBE PLACEMENT  Date of Surgery:  Clearance 05/20/22                                 Surgeon:  NOT LISTED Surgeon's Group or Practice Name:   [facility] Phone number:   Fax number:  [phone]   Type of Clearance Requested:   - Medical  - Pharmacy:  Hold Apixaban (Eliquis)     Type of Anesthesia:  Not Indicated   Additional requests/questions:    Elpidio Anis   05/12/2022, 5:19 PM

## 2022-05-12 NOTE — Telephone Encounter (Signed)
Pharmacy please advise on holding Eliquis prior to SP tube placement scheduled for 05/20/2022. Thank you.

## 2022-05-14 ENCOUNTER — Telehealth: Payer: Self-pay | Admitting: *Deleted

## 2022-05-14 NOTE — Telephone Encounter (Signed)
   Name: Carmen Haney  DOB: 1930-12-22  MRN: [id number]  Primary Cardiologist: Olga Millers, MD   Preoperative team, please contact this patient and set up a phone call appointment for further preoperative risk assessment. Please obtain consent and complete medication review. Thank you for your help.  I confirm that guidance regarding antiplatelet and oral anticoagulation therapy has been completed and, if necessary, noted below.  Pharmacy has addressed request for Eliquis hold.   Ronney Asters, NP 05/14/2022, 4:43 PM [facility]

## 2022-05-14 NOTE — Telephone Encounter (Signed)
Patient with diagnosis of afib on Eliquis for anticoagulation.    Procedure: SP TUBE PLACEMENT  Date of procedure: 05/20/22   CHA2DS2-VASc Score = 7   This indicates a 11.2% annual risk of stroke. The patient's score is based upon: CHF History: 1 HTN History: 1 Diabetes History: 1 Stroke History: 0 Vascular Disease History: 1 Age Score: 2 Gender Score: 1      CrCl 68 ml/min Platelet count 269  Per office protocol, patient can hold Eliquis for 2 days prior to procedure.    **This guidance is not considered finalized until pre-operative APP has relayed final recommendations.**

## 2022-05-14 NOTE — Telephone Encounter (Signed)
I s/w the pt and her daughter [name] who scheduled tele pre op appt 05/15/22 @ 3:20 due to med hold and procedure date. I will update all parties involved.  Med rec and consent are done.

## 2022-05-14 NOTE — Telephone Encounter (Signed)
I s/w the pt and her [name] who scheduled tele pre op appt 05/15/22 @ 3:20 due to med hold and procedure date. I will update all parties involved.  Med rec and consent are done.     Patient Consent for Virtual Visit        Carmen Haney has provided verbal consent on 05/14/2022 for a virtual visit (video or telephone).   CONSENT FOR VIRTUAL VISIT FOR:  Carmen Haney  By participating in this virtual visit I agree to the following:  I hereby voluntarily request, consent and authorize [facility] and its employed or contracted physicians, physician assistants, nurse practitioners or other licensed health care professionals (the Practitioner), to provide me with telemedicine health care services (the "Services") as deemed necessary by the treating Practitioner. I acknowledge and consent to receive the Services by the Practitioner via telemedicine. I understand that the telemedicine visit will involve communicating with the Practitioner through live audiovisual communication technology and the disclosure of certain medical information by electronic transmission. I acknowledge that I have been given the opportunity to request an in-person assessment or other available alternative prior to the telemedicine visit and am voluntarily participating in the telemedicine visit.  I understand that I have the right to withhold or withdraw my consent to the use of telemedicine in the course of my care at any time, without affecting my right to future care or treatment, and that the Practitioner or I may terminate the telemedicine visit at any time. I understand that I have the right to inspect all information obtained and/or recorded in the course of the telemedicine visit and may receive copies of available information for a reasonable fee.  I understand that some of the potential risks of receiving the Services via telemedicine include:  Delay or interruption in medical evaluation due to  technological equipment failure or disruption; Information transmitted may not be sufficient (e.g. poor resolution of images) to allow for appropriate medical decision making by the Practitioner; and/or  In rare instances, security protocols could fail, causing a breach of personal health information.  Furthermore, I acknowledge that it is my responsibility to provide information about my medical history, conditions and care that is complete and accurate to the best of my ability. I acknowledge that Practitioner's advice, recommendations, and/or decision may be based on factors not within their control, such as incomplete or inaccurate data provided by me or distortions of diagnostic images or specimens that may result from electronic transmissions. I understand that the practice of medicine is not an exact science and that Practitioner makes no warranties or guarantees regarding treatment outcomes. I acknowledge that a copy of this consent can be made available to me via my patient portal [facility] MyChart), or I can request a printed copy by calling the office of [facility].    I understand that my insurance will be billed for this visit.   I have read or had this consent read to me. I understand the contents of this consent, which adequately explains the benefits and risks of the Services being provided via telemedicine.  I have been provided ample opportunity to ask questions regarding this consent and the Services and have had my questions answered to my satisfaction. I give my informed consent for the services to be provided through the use of telemedicine in my medical care

## 2022-05-15 ENCOUNTER — Ambulatory Visit: Payer: Medicare PPO | Attending: Cardiovascular Disease

## 2022-05-15 ENCOUNTER — Telehealth: Payer: Self-pay

## 2022-05-15 DIAGNOSIS — F419 Anxiety disorder, unspecified: Secondary | ICD-10-CM | POA: Diagnosis not present

## 2022-05-15 DIAGNOSIS — N183 Chronic kidney disease, stage 3 unspecified: Secondary | ICD-10-CM | POA: Diagnosis not present

## 2022-05-15 DIAGNOSIS — T83511A Infection and inflammatory reaction due to indwelling urethral catheter, initial encounter: Secondary | ICD-10-CM | POA: Diagnosis not present

## 2022-05-15 DIAGNOSIS — Z01810 Encounter for preprocedural cardiovascular examination: Secondary | ICD-10-CM

## 2022-05-15 DIAGNOSIS — F322 Major depressive disorder, single episode, severe without psychotic features: Secondary | ICD-10-CM | POA: Diagnosis not present

## 2022-05-15 DIAGNOSIS — J439 Emphysema, unspecified: Secondary | ICD-10-CM | POA: Diagnosis not present

## 2022-05-15 DIAGNOSIS — G2582 Stiff-man syndrome: Secondary | ICD-10-CM | POA: Diagnosis not present

## 2022-05-15 DIAGNOSIS — R29898 Other symptoms and signs involving the musculoskeletal system: Secondary | ICD-10-CM | POA: Diagnosis not present

## 2022-05-15 DIAGNOSIS — I5032 Chronic diastolic (congestive) heart failure: Secondary | ICD-10-CM | POA: Diagnosis not present

## 2022-05-15 DIAGNOSIS — I2510 Atherosclerotic heart disease of native coronary artery without angina pectoris: Secondary | ICD-10-CM | POA: Diagnosis not present

## 2022-05-15 DIAGNOSIS — I4891 Unspecified atrial fibrillation: Secondary | ICD-10-CM | POA: Diagnosis not present

## 2022-05-15 DIAGNOSIS — I130 Hypertensive heart and chronic kidney disease with heart failure and stage 1 through stage 4 chronic kidney disease, or unspecified chronic kidney disease: Secondary | ICD-10-CM | POA: Diagnosis not present

## 2022-05-15 DIAGNOSIS — R760 Raised antibody titer: Secondary | ICD-10-CM | POA: Diagnosis not present

## 2022-05-15 DIAGNOSIS — N390 Urinary tract infection, site not specified: Secondary | ICD-10-CM | POA: Diagnosis not present

## 2022-05-15 NOTE — Telephone Encounter (Signed)
Ok to hold PT services due to procedure.

## 2022-05-15 NOTE — Telephone Encounter (Signed)
[name] with [facility] called stating that patient is requesting to put therapy on hold for the next 2 weeks due to her having a suprapubic catheter placed.  Message routed to Hazle Nordmann, NP [phone])

## 2022-05-15 NOTE — Progress Notes (Signed)
Virtual Visit via Telephone Note   Because of Carmen Haney's co-morbid illnesses, she is at least at moderate risk for complications without adequate follow up.  This format is felt to be most appropriate for this patient at this time.  The patient did not have access to video technology/had technical difficulties with video requiring transitioning to audio format only (telephone).  All issues noted in this document were discussed and addressed.  No physical exam could be performed with this format.  Please refer to the patient's chart for her consent to telehealth for [facility].  Evaluation Performed:  Preoperative cardiovascular risk assessment _____________   Date:  05/15/2022   Patient ID:  Carmen Haney, DOB Feb 10, 1930, MRN [id number] Patient Location:  Home Provider location:   Office  Primary Care Provider:  [name], NP Primary Cardiologist:  Olga Millers, MD  Chief Complaint / Patient Profile   89 y.o. y/o female with a h/o paroxysmal atrial fibrillation, HTN, CAD, chronic diastolic CHF who is pending SP TUBE PLACEMENT  and presents today for telephonic preoperative cardiovascular risk assessment.  History of Present Illness    Carmen Haney is a 89 y.o. female who presents via audio/video conferencing for a telehealth visit today.  Pt was last seen in cardiology clinic on 01/30/2022 by Dr. Jens Som.  At that time Carmen Haney was doing well .  The patient is now pending procedure as outlined above. Since her last visit, she continues to be stable from a cardiac standpoint.  Today she denies chest pain, increased shortness of breath, palpitations, melena, hematuria, hemoptysis, diaphoresis, weakness, syncope, orthopnea, and PND.   Past Medical History    Past Medical History:  Diagnosis Date   ACE-inhibitor cough    Breast CA (HCC) 2010   s/p lumpectomy & radiation   CAD (coronary artery disease) 07/1998   s/p cath in 2000. 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    Thyroid disease    Vertebral fracture, pathological    Past Surgical History:  Procedure Laterality Date   BREAST BIOPSY Left    BREAST LUMPECTOMY Right March 2010   BREAST LUMPECTOMY WITH RADIOACTIVE SEED LOCALIZATION Left 09/20/2014   Procedure: LEFT BREAST LUMPECTOMY WITH RADIOACTIVE SEED LOCALIZATION;  Surgeon: [name], MD;  Location: [facility];  Service: General;  Laterality: Left;   CARPAL TUNNEL RELEASE Right    CESAREAN SECTION     EYE SURGERY     Right Eye   Skin Cancer Removed from Right Forearm     TOTAL ABDOMINAL HYSTERECTOMY      Allergies  Allergies  Allergen Reactions   Ace Inhibitors Cough   Latex Rash    Home Medications    Prior to Admission medications   Medication Sig Start Date End Date Taking? Authorizing Provider  acetaminophen (TYLENOL) 500 MG tablet Take 1,000 mg by mouth in the morning and at bedtime.    [provider]  apixaban (ELIQUIS) 5 MG TABS tablet Take 1 tablet (5 mg total) by mouth 2 (two) times daily. 02/16/21   Marcelino Duster, PA  Baclofen 5 MG TABS Take 1 tablet (5 mg total) by mouth 3 (three) times daily as needed. Patient taking differently: Take 5 mg by mouth in the morning and at bedtime. 05/06/22 07/05/22  Antony Madura, MD  brexpiprazole (REXULTI) 0.25 MG TABS tablet Take 1 tablet (0.25 mg total) by mouth daily. 02/05/22   Fargo, Amy E, NP  Calcium-Phosphorus-Vitamin D (CALCIUM/D3 ADULT GUMMIES PO) Take 500 mg by mouth in the morning.    [provider]  docusate sodium (COLACE) 100 MG capsule Take 1 capsule (100 mg total) by mouth 2 (two) times daily. Patient  taking differently: Take 100 mg by mouth daily as needed for mild constipation. 12/01/21   [name], MD  DULoxetine 40 MG CPEP Take 1 capsule (40 mg total) by mouth at bedtime. Patient taking differently: Take 20 mg by mouth daily. 03/19/22   Danford, Earl Lites, MD  furosemide (LASIX) 20 MG tablet Take 20 mg by mouth daily.    [provider]  levothyroxine (SYNTHROID) 150 MCG tablet Take 1 tablet (150 mcg total) by mouth daily before breakfast. 03/13/22   Fargo, Amy E, NP  metoprolol tartrate (LOPRESSOR) 25 MG tablet Take 0.5 tablets (12.5 mg total) by mouth 2 (two) times daily. 02/16/21   Duke, Roe Rutherford, PA  nystatin-triamcinolone ointment (MYCOLOG) Apply 1 Application topically 2 (two) times daily. Patient not taking: Reported on 05/10/2022 04/26/22   [name], MD  polyethylene glycol (MIRALAX / GLYCOLAX) 17 g packet Take 17 g by mouth daily as needed for mild constipation.    [provider]  Probiotic Product (PROBIOTIC GUMMIES PO) Take 2 tablets by mouth in the morning.    [provider]  olmesartan (BENICAR) 20 MG tablet Take 20 mg by mouth daily.    04/25/10  [provider]    Physical Exam    Vital Signs:  Carmen Haney does not have vital signs available for review today.  Given telephonic nature of communication, physical exam is limited. AAOx3. NAD. Normal affect.  Speech and respirations are unlabored.  Accessory Clinical Findings    None  Assessment & Plan    1.  Preoperative Cardiovascular Risk Assessment: SP TUBE PLACEMENT , [place] [facility]ology, [id number]      Primary Cardiologist: Olga Millers, MD  Chart reviewed as part of pre-operative protocol coverage. Given past medical history and time since last visit, based on ACC/AHA guidelines, Carmen Haney would be at acceptable risk for the planned procedure without further cardiovascular testing.   Patient was advised that if she develops  new symptoms prior to surgery to contact our office to arrange a follow-up appointment.  She verbalized understanding.  Patient with diagnosis of afib on Eliquis for anticoagulation.     Procedure: SP TUBE PLACEMENT  Date of procedure: 05/20/22     CHA2DS2-VASc Score = 7   This indicates a 11.2% annual risk of stroke. The patient's score is based upon: CHF History: 1 HTN History: 1 Diabetes History: 1 Stroke History: 0 Vascular Disease History: 1 Age Score: 2 Gender Score: 1       CrCl 68 ml/min Platelet count 269   Per office protocol, patient can hold Eliquis for 2 days prior to procedure.  I will route this recommendation to the requesting party via Epic fax function and remove from pre-op pool.  Please call with questions.     Time:   Today, I have spent 15 minutes with the patient  with telehealth technology discussing medical history, symptoms, and management plan.  Prior to her phone evaluation I spent greater than 10 minutes reviewing her past medical history and cardiac medications.   Carmen Asters, NP  05/15/2022, 7:42 AM

## 2022-05-19 ENCOUNTER — Other Ambulatory Visit: Payer: Self-pay | Admitting: Student

## 2022-05-19 DIAGNOSIS — R339 Retention of urine, unspecified: Secondary | ICD-10-CM

## 2022-05-20 ENCOUNTER — Ambulatory Visit (HOSPITAL_COMMUNITY)
Admission: RE | Admit: 2022-05-20 | Discharge: 2022-05-20 | Disposition: A | Discharge status: Home / Self Care | Payer: Medicare PPO | Source: Ambulatory Visit | Attending: Urology | Admitting: Urology

## 2022-05-20 ENCOUNTER — Encounter (HOSPITAL_COMMUNITY): Payer: Self-pay

## 2022-05-20 ENCOUNTER — Other Ambulatory Visit: Payer: Self-pay

## 2022-05-20 VITALS — BP 109/53 | HR 61 | Temp 98.2°F | Resp 18 | Ht 62.0 in | Wt 200.0 lb

## 2022-05-20 DIAGNOSIS — Z7901 Long term (current) use of anticoagulants: Secondary | ICD-10-CM | POA: Diagnosis not present

## 2022-05-20 DIAGNOSIS — Z853 Personal history of malignant neoplasm of breast: Secondary | ICD-10-CM | POA: Diagnosis not present

## 2022-05-20 DIAGNOSIS — Z87891 Personal history of nicotine dependence: Secondary | ICD-10-CM | POA: Diagnosis not present

## 2022-05-20 DIAGNOSIS — R339 Retention of urine, unspecified: Secondary | ICD-10-CM | POA: Diagnosis not present

## 2022-05-20 DIAGNOSIS — R338 Other retention of urine: Secondary | ICD-10-CM | POA: Diagnosis not present

## 2022-05-20 DIAGNOSIS — I4891 Unspecified atrial fibrillation: Secondary | ICD-10-CM | POA: Insufficient documentation

## 2022-05-20 DIAGNOSIS — R760 Raised antibody titer: Secondary | ICD-10-CM | POA: Diagnosis not present

## 2022-05-20 DIAGNOSIS — G2582 Stiff-man syndrome: Secondary | ICD-10-CM | POA: Diagnosis not present

## 2022-05-20 LAB — CBC
HCT: 39.3 % (ref 36.0–46.0)
Hemoglobin: 13.1 g/dL (ref 12.0–15.0)
MCH: 30.6 pg (ref 26.0–34.0)
MCHC: 33.3 g/dL (ref 30.0–36.0)
MCV: 91.8 fL (ref 80.0–100.0)
Platelets: 284 10*3/uL (ref 150–400)
RBC: 4.28 MIL/uL (ref 3.87–5.11)
RDW: 13.9 % (ref 11.5–15.5)
WBC: 5.7 10*3/uL (ref 4.0–10.5)
nRBC: 0 % (ref 0.0–0.2)

## 2022-05-20 LAB — PROTIME-INR
INR: 1.1 (ref 0.8–1.2)
Prothrombin Time: 14.2 seconds (ref 11.4–15.2)

## 2022-05-20 MED ORDER — LIDOCAINE HCL (PF) 1 % IJ SOLN
10.0000 mL | Freq: Once | INTRAMUSCULAR | Status: AC
  Administered 2022-05-20: 10 mL
  Filled 2022-05-20: qty 10

## 2022-05-20 MED ORDER — SODIUM CHLORIDE 0.9% FLUSH: 5.0000 mL | Freq: Three times a day (TID) | INTRAVENOUS | Status: DC

## 2022-05-20 MED ORDER — MIDAZOLAM HCL 2 MG/2ML IJ SOLN
INTRAMUSCULAR | Status: AC
  Filled 2022-05-20: qty 2

## 2022-05-20 MED ORDER — MIDAZOLAM HCL 2 MG/2ML IJ SOLN
INTRAMUSCULAR | Status: AC | PRN
  Administered 2022-05-20 (×2): 1 mg via INTRAVENOUS

## 2022-05-20 MED ORDER — SODIUM CHLORIDE 0.9 % IV SOLN: INTRAVENOUS | Status: DC

## 2022-05-20 MED ORDER — FENTANYL CITRATE (PF) 100 MCG/2ML IJ SOLN
INTRAMUSCULAR | Status: AC | PRN
  Administered 2022-05-20: 50 ug via INTRAVENOUS

## 2022-05-20 MED ORDER — FENTANYL CITRATE (PF) 100 MCG/2ML IJ SOLN
INTRAMUSCULAR | Status: AC
  Filled 2022-05-20: qty 2

## 2022-05-20 NOTE — Progress Notes (Signed)
Spoke with Beckey Downing who stated patient could restart eliquis tomorrow, dressing can come off in 48 hrs, and radiology will call patient with follow up appointment.

## 2022-05-20 NOTE — Procedures (Signed)
Interventional Radiology Procedure Note  Procedure: CT guided suprapubic catheter placement  Complications: None  Estimated Blood Loss: None  Findings: 14 Fr suprapubic catheter placed in bladder with good return of urine. Attached to gravity bag drainage.  Plan: Upsize to 16 Fr Coucil Foley catheter in 4-6 weeks.  Jodi [place]. Fredia Sorrow, M.D Pager:  [phone]

## 2022-05-20 NOTE — H&P (Signed)
Chief Complaint: Patient was seen in consultation today for suprapubic catheter placement at the request of Carmen Haney,Carmen Haney  Referring Physician(s): Carmen Haney,Carmen Haney  Supervising Physician: Irish Lack  Patient Status: [facility] - Out-pt  History of Present Illness: Carmen Haney STATES is a 89 y.o. female   Hx Breast Ca Anxiety disorder Stiff person syndrome--- spasmodic/bedbound Afib-- Eliquis:  LD 4 days ago Chronic foley catheter Urinary retention  Scheduled for suprapubic catheter placement in IR per Dr [name] request   Past Medical History:  Diagnosis Date   ACE-inhibitor cough    Breast CA (HCC) 2010   s/p lumpectomy & radiation   CAD (coronary artery disease) 07/1998   s/p cath in 2000. 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    Thyroid disease    Vertebral fracture, pathological     Past Surgical History:  Procedure Laterality Date   BREAST BIOPSY Left    BREAST LUMPECTOMY Right March 2010   BREAST LUMPECTOMY WITH RADIOACTIVE SEED LOCALIZATION Left 09/20/2014   Procedure: LEFT BREAST LUMPECTOMY WITH RADIOACTIVE SEED LOCALIZATION;  Surgeon: [name], MD;  Location: [facility];  Service: General;  Laterality: Left;   CARPAL TUNNEL RELEASE Right    CESAREAN SECTION     EYE SURGERY     Right Eye   Skin Cancer Removed from Right Forearm     TOTAL ABDOMINAL HYSTERECTOMY      Allergies: Ace inhibitors and Latex  Medications: Prior to Admission medications   Medication Sig Start Date End Date Taking? Authorizing Provider  acetaminophen (TYLENOL) 500 MG tablet Take 1,000 mg by mouth in the morning and at bedtime.   Yes [provider]  Baclofen 5 MG TABS Take 1 tablet (5 mg total) by mouth 3 (three) times daily as needed. Patient taking differently: Take 5 mg by mouth in the morning and at bedtime. 05/06/22 07/05/22 Yes Antony Madura, MD  brexpiprazole (REXULTI) 0.25 MG TABS tablet Take 1 tablet (0.25 mg total) by mouth daily. 02/05/22  Yes Fargo, Amy E, NP  Calcium-Phosphorus-Vitamin Haney (CALCIUM/D3 ADULT GUMMIES PO) Take 500 mg by mouth in the morning.   Yes [provider]  docusate sodium (COLACE) 100 MG capsule Take 1 capsule (100 mg total) by mouth 2 (two) times daily. Patient taking differently: Take 100 mg by mouth daily as needed for mild constipation. 12/01/21  Yes [name], MD  DULoxetine 40 MG CPEP Take 1 capsule (40 mg total) by mouth at bedtime. Patient taking differently: Take 20 mg by mouth daily. 03/19/22  Yes Danford, Earl Lites, MD  furosemide (LASIX) 20 MG tablet Take 20 mg by mouth daily.   Yes [provider]  levothyroxine (SYNTHROID) 150 MCG tablet Take 1 tablet (150 mcg total) by mouth daily before breakfast. 03/13/22  Yes Fargo, Amy E, NP  metoprolol tartrate (LOPRESSOR) 25 MG tablet Take 0.5 tablets (12.5 mg total) by mouth 2 (two) times daily. 02/16/21  Yes Duke, Roe Rutherford, PA  polyethylene glycol (MIRALAX / GLYCOLAX) 17 g packet Take 17 g by mouth daily as needed for mild constipation.   Yes  [provider]  Probiotic Product (PROBIOTIC GUMMIES PO) Take 2 tablets by mouth in the morning.   Yes [provider]  apixaban (ELIQUIS) 5 MG TABS tablet Take 1 tablet (5 mg total) by mouth 2 (two) times daily. 02/16/21   Duke, Roe Rutherford, PA  nystatin-triamcinolone ointment (MYCOLOG) Apply 1 Application topically 2 (two) times daily. Patient not taking: Reported on 05/10/2022 04/26/22   [name], MD  olmesartan (BENICAR) 20 MG tablet Take 20 mg by mouth daily.    04/25/10  [provider]     Family History  Problem Relation Age of  Onset   Coronary artery disease Mother    Hypertension Mother    Melanoma Mother    Cancer Mother 71       breast   Breast cancer Mother    Cancer Father 53       prostate   Cancer Sister 77       breast   Breast cancer Sister    Cancer Maternal Aunt 62       breast   Cancer Sister 24       breast   Breast cancer Sister    Breast cancer Daughter 1    Social History   Socioeconomic History   Marital status: Widowed    Spouse name: [name]   Number of children: Not on file   Years of education: Not on file   Highest education level: Not on file  Occupational History   Occupation: Retired from [employer]    Employer: RETIRED  Tobacco Use   Smoking status: Former    Types: Cigarettes    Quit date: 01/13/1963    Years since quitting: 59.3   Smokeless tobacco: Never   Tobacco comments:    stopped in 1970's  Vaping Use   Vaping Use: Never used  Substance and Sexual Activity   Alcohol use: No    Alcohol/week: 0.0 standard drinks of alcohol   Drug use: Never   Sexual activity: Not on file  Other Topics Concern   Not on file  Social History Narrative   Are you right handed or left handed? right   Are you currently employed ?    What is your current occupation?   Do you live at home alone?   Who lives with you? Daughter and husband   What type of home do you live in: 1 story or 2 story? two    Caffeine 2 cups a day   Social Determinants of Health   Financial Resource Strain: Low Risk  (02/23/2022)   Overall Financial Resource Strain (CARDIA)    Difficulty of Paying Living Expenses: Not hard at all  Food Insecurity: No Food Insecurity (03/15/2022)   Hunger Vital Sign    Worried About Running Out of Food in the Last Year: Never true    Ran Out of Food in the Last Year: Never true  Transportation Needs: No Transportation Needs (03/15/2022)   PRAPARE - Administrator, Civil Service (Medical): No    Lack of Transportation (Non-Medical): No  Physical Activity:  Inactive (02/23/2022)   Exercise Vital Sign    Days of Exercise per Week: 0 days    Minutes of Exercise per Session: 0 min  Stress: Stress Concern Present (02/23/2022)   [employer] of Occupational Health - Occupational Stress Questionnaire    Feeling of Stress : Rather much  Social Connections: Socially Isolated (02/23/2022)   Social Connection and Isolation Panel [NHANES]  Frequency of Communication with Friends and Family: Three times a week    Frequency of Social Gatherings with Friends and Family: Twice a week    Attends Religious Services: Never    Database administrator or Organizations: No    Attends Banker Meetings: Never    Marital Status: Widowed    Review of Systems: A 12 point ROS discussed and pertinent positives are indicated in the HPI above.  All other systems are negative.  Review of Systems  Constitutional:  Negative for activity change, fatigue and fever.  Respiratory:  Negative for cough and shortness of breath.   Cardiovascular:  Negative for chest pain.  Gastrointestinal:  Negative for abdominal pain.  Genitourinary:  Positive for pelvic pain.  Neurological:  Positive for weakness.  Psychiatric/Behavioral:  Negative for behavioral problems and confusion.     Vital Signs: BP 136/76   Pulse 78   Temp 98.2 F (36.8 C) (Temporal)   Resp 18   Ht 5\' 2"  (1.575 m)   Wt 200 lb (90.7 kg)   SpO2 93%   BMI 36.58 kg/m     Physical Exam Vitals reviewed.  HENT:     Mouth/Throat:     Mouth: Mucous membranes are moist.  Cardiovascular:     Rate and Rhythm: Normal rate. Rhythm irregular.  Pulmonary:     Effort: Pulmonary effort is normal.     Breath sounds: Normal breath sounds.  Abdominal:     Palpations: Abdomen is soft.  Musculoskeletal:        General: Normal range of motion.     Comments: Uses arms well; legs are without much movement or use  Neurological:     Mental Status: She is alert and oriented to person, place, and time.   Psychiatric:        Behavior: Behavior normal.     Imaging: CT ABDOMEN PELVIS W CONTRAST  Result Date: 04/26/2022 CLINICAL DATA:  Abdominal pain. Persistent rectal and vaginal pain since last visit. Chronic constipation. EXAM: CT ABDOMEN AND PELVIS WITH CONTRAST TECHNIQUE: Multidetector CT imaging of the abdomen and pelvis was performed using the standard protocol following bolus administration of intravenous contrast. RADIATION DOSE REDUCTION: This exam was performed according to the departmental dose-optimization program which includes automated exposure control, adjustment of the mA and/or kV according to patient size and/or use of iterative reconstruction technique. CONTRAST:  OMNIPAQUE IOHEXOL 300 MG/ML  SOLN COMPARISON:  02/11/2022 FINDINGS: Lower chest: No pleural fluid or airspace consolidation. Mild scarring identified within the visualized lung bases. Hepatobiliary: No suspicious liver abnormality. Again seen are several small, less than 1 cm low-density liver lesions which likely represent cysts. The gallbladder appears normal. No CT visible stones, gallbladder wall inflammation, or bile duct dilatation. Pancreas: Unremarkable. No pancreatic ductal dilatation or surrounding inflammatory changes. Spleen: Normal in size without focal abnormality. Adrenals/Urinary Tract: Normal adrenal glands. Right parapelvic cyst is unchanged measuring 3 cm, image 32/2. Posterior cortex left kidney cyst is unchanged measuring 1.1 cm. Additional, less than 1 cm hypodense kidney lesions are noted which are technically too small to characterize compatible with benign Bosniak class 2 lesions. No follow-up imaging recommended. Upper pole right kidney stone measures 5 mm, image 62/4. No left kidney stones. No hydronephrosis identified bilaterally. The urinary bladder is partially decompressed around a Foley catheter. There is wall thickening and enhancement of the urinary bladder with surrounding soft tissue  haziness. The wall thickening may in part reflect incomplete distension. No focal bladder  lesion identified Stomach/Bowel: Stomach is normal. No pathologic dilatation of the large or small bowel loops. No bowel wall thickening or inflammation. The appendix is visualized and appears normal. Vascular/Lymphatic: No aneurysm. Mild aortic atherosclerotic calcifications. No signs of abdominopelvic adenopathy. Reproductive: Aortic atherosclerosis. No enlarged abdominal or pelvic lymph nodes. Other: There is new presacral soft tissue edema. No free fluid or fluid collections identified. No signs of pneumoperitoneum. Musculoskeletal: No acute findings. Bones appear diffusely osteopenic. Stable compression deformity involving the T12 vertebra with loss of approximately 30% of the superior endplate height. Mild L3 central vertebral body endplate deformity is unchanged. IMPRESSION: 1. The urinary bladder is partially decompressed around a Foley catheter. There is wall thickening and enhancement of the urinary bladder with surrounding soft tissue haziness. The wall thickening may in part reflect incomplete distension. Correlate for any clinical signs or symptoms of cystitis. 2. New presacral soft tissue edema. Etiology is indeterminate. 3. Nonobstructing right renal calculus. 4. Stable T12 and L3 vertebral body compression deformities. 5.  Aortic Atherosclerosis (ICD10-I70.0). Electronically Signed   By: Signa Kell M.Haney.   On: 04/26/2022 09:53    Labs:  CBC: Recent Labs    04/27/22 0454 04/30/22 0505 05/10/22 1241 05/20/22 0933  WBC 5.7 5.6 5.7 5.7  HGB 10.8* 11.3* 11.5* 13.1  HCT 33.7* 35.2* 35.5* 39.3  PLT 255 276 269 284    COAGS: Recent Labs    11/27/21 0730 04/18/22 1122  INR 1.2 1.2  APTT  --  33    BMP: Recent Labs    04/26/22 0756 04/27/22 0454 04/30/22 0505 05/10/22 1241  NA 132* 133* 136 132*  K 3.5 3.5 3.8 4.2  CL 93* 98 100 93*  CO2 29 27 28 28   GLUCOSE 110* 103* 111* 102*   BUN 9 7* 8 11  CALCIUM 8.7* 8.1* 8.5* 8.4*  CREATININE 0.61 0.49 0.48 0.58  GFRNONAA >60 >60 >60 >60    LIVER FUNCTION TESTS: Recent Labs    02/12/22 0601 02/13/22 0505 03/14/22 2220 03/16/22 1015 04/18/22 1122 04/26/22 0756  BILITOT 0.6  --  0.5  --  0.7 0.6  AST 17  --  18  --  16 13*  ALT 12  --  14  --  11 8  ALKPHOS 60  --  81  --  75 68  PROT 6.6  --  6.6  --  7.2 6.7  ALBUMIN 3.2*   < > 3.4* 3.2* 3.8 3.3*   < > = values in this interval not displayed.    TUMOR MARKERS: No results for input(s): "AFPTM", "CEA", "CA199", "CHROMGRNA" in the last 8760 hours.  Assessment and Plan:  Urinary retention Chronic foley catheter Stiff person syndrome-- bed bound Scheduled for supra pubic catheter placement Risks and benefits discussed with the patient and daughter including bleeding, infection, damage to adjacent structures,and sepsis.  All of the patient's questions were answered, patient is agreeable to proceed. Consent signed and in chart.  Thank you for this interesting consult.  I greatly enjoyed meeting NAIA PASQUALONE and look forward to participating in their care.  A copy of this report was sent to the requesting provider on this date.  Electronically Signed: Robet Leu, PA-C 05/20/2022, 9:55 AM   I spent a total of  30 Minutes   in face to face in clinical consultation, greater than 50% of which was counseling/coordinating care for supra pubic catheter placement

## 2022-05-20 NOTE — Progress Notes (Signed)
Urinary foley bag clamped at 0905

## 2022-05-27 DIAGNOSIS — I2510 Atherosclerotic heart disease of native coronary artery without angina pectoris: Secondary | ICD-10-CM | POA: Diagnosis not present

## 2022-05-27 DIAGNOSIS — N390 Urinary tract infection, site not specified: Secondary | ICD-10-CM | POA: Diagnosis not present

## 2022-05-27 DIAGNOSIS — F322 Major depressive disorder, single episode, severe without psychotic features: Secondary | ICD-10-CM | POA: Diagnosis not present

## 2022-05-27 DIAGNOSIS — I130 Hypertensive heart and chronic kidney disease with heart failure and stage 1 through stage 4 chronic kidney disease, or unspecified chronic kidney disease: Secondary | ICD-10-CM | POA: Diagnosis not present

## 2022-05-27 DIAGNOSIS — F419 Anxiety disorder, unspecified: Secondary | ICD-10-CM | POA: Diagnosis not present

## 2022-05-27 DIAGNOSIS — N183 Chronic kidney disease, stage 3 unspecified: Secondary | ICD-10-CM | POA: Diagnosis not present

## 2022-05-27 DIAGNOSIS — I5032 Chronic diastolic (congestive) heart failure: Secondary | ICD-10-CM | POA: Diagnosis not present

## 2022-05-27 DIAGNOSIS — T83511A Infection and inflammatory reaction due to indwelling urethral catheter, initial encounter: Secondary | ICD-10-CM | POA: Diagnosis not present

## 2022-05-27 DIAGNOSIS — J439 Emphysema, unspecified: Secondary | ICD-10-CM | POA: Diagnosis not present

## 2022-05-28 ENCOUNTER — Telehealth: Payer: Self-pay | Admitting: *Deleted

## 2022-05-28 DIAGNOSIS — F419 Anxiety disorder, unspecified: Secondary | ICD-10-CM | POA: Diagnosis not present

## 2022-05-28 DIAGNOSIS — T83511A Infection and inflammatory reaction due to indwelling urethral catheter, initial encounter: Secondary | ICD-10-CM | POA: Diagnosis not present

## 2022-05-28 DIAGNOSIS — I2510 Atherosclerotic heart disease of native coronary artery without angina pectoris: Secondary | ICD-10-CM | POA: Diagnosis not present

## 2022-05-28 DIAGNOSIS — J439 Emphysema, unspecified: Secondary | ICD-10-CM | POA: Diagnosis not present

## 2022-05-28 DIAGNOSIS — N183 Chronic kidney disease, stage 3 unspecified: Secondary | ICD-10-CM | POA: Diagnosis not present

## 2022-05-28 DIAGNOSIS — N390 Urinary tract infection, site not specified: Secondary | ICD-10-CM | POA: Diagnosis not present

## 2022-05-28 DIAGNOSIS — I130 Hypertensive heart and chronic kidney disease with heart failure and stage 1 through stage 4 chronic kidney disease, or unspecified chronic kidney disease: Secondary | ICD-10-CM | POA: Diagnosis not present

## 2022-05-28 DIAGNOSIS — I5032 Chronic diastolic (congestive) heart failure: Secondary | ICD-10-CM | POA: Diagnosis not present

## 2022-05-28 DIAGNOSIS — F411 Generalized anxiety disorder: Secondary | ICD-10-CM | POA: Diagnosis not present

## 2022-05-28 DIAGNOSIS — F331 Major depressive disorder, recurrent, moderate: Secondary | ICD-10-CM | POA: Diagnosis not present

## 2022-05-28 DIAGNOSIS — F322 Major depressive disorder, single episode, severe without psychotic features: Secondary | ICD-10-CM | POA: Diagnosis not present

## 2022-05-28 NOTE — Telephone Encounter (Signed)
Carmen Haney with [facility] [phone] called requesting Verbal PRN Orders Verbal orders given.

## 2022-05-29 ENCOUNTER — Telehealth: Payer: Self-pay

## 2022-05-29 NOTE — Telephone Encounter (Signed)
Thank you  for update

## 2022-05-29 NOTE — Telephone Encounter (Signed)
[name] with [facility] called to inform PCP that patient will have a missed visit this week as a result of them being unable to reach patient (numbers on file verified)

## 2022-05-31 DIAGNOSIS — I130 Hypertensive heart and chronic kidney disease with heart failure and stage 1 through stage 4 chronic kidney disease, or unspecified chronic kidney disease: Secondary | ICD-10-CM | POA: Diagnosis not present

## 2022-05-31 DIAGNOSIS — I2510 Atherosclerotic heart disease of native coronary artery without angina pectoris: Secondary | ICD-10-CM | POA: Diagnosis not present

## 2022-05-31 DIAGNOSIS — F322 Major depressive disorder, single episode, severe without psychotic features: Secondary | ICD-10-CM | POA: Diagnosis not present

## 2022-05-31 DIAGNOSIS — N183 Chronic kidney disease, stage 3 unspecified: Secondary | ICD-10-CM | POA: Diagnosis not present

## 2022-05-31 DIAGNOSIS — I5032 Chronic diastolic (congestive) heart failure: Secondary | ICD-10-CM | POA: Diagnosis not present

## 2022-05-31 DIAGNOSIS — F419 Anxiety disorder, unspecified: Secondary | ICD-10-CM | POA: Diagnosis not present

## 2022-05-31 DIAGNOSIS — N390 Urinary tract infection, site not specified: Secondary | ICD-10-CM | POA: Diagnosis not present

## 2022-05-31 DIAGNOSIS — J439 Emphysema, unspecified: Secondary | ICD-10-CM | POA: Diagnosis not present

## 2022-05-31 DIAGNOSIS — T83511A Infection and inflammatory reaction due to indwelling urethral catheter, initial encounter: Secondary | ICD-10-CM | POA: Diagnosis not present

## 2022-06-01 ENCOUNTER — Encounter: Payer: Self-pay | Admitting: Orthopedic Surgery

## 2022-06-01 DIAGNOSIS — N183 Chronic kidney disease, stage 3 unspecified: Secondary | ICD-10-CM | POA: Diagnosis not present

## 2022-06-01 DIAGNOSIS — F322 Major depressive disorder, single episode, severe without psychotic features: Secondary | ICD-10-CM | POA: Diagnosis not present

## 2022-06-01 DIAGNOSIS — J439 Emphysema, unspecified: Secondary | ICD-10-CM | POA: Diagnosis not present

## 2022-06-01 DIAGNOSIS — I2510 Atherosclerotic heart disease of native coronary artery without angina pectoris: Secondary | ICD-10-CM | POA: Diagnosis not present

## 2022-06-01 DIAGNOSIS — N390 Urinary tract infection, site not specified: Secondary | ICD-10-CM | POA: Diagnosis not present

## 2022-06-01 DIAGNOSIS — I130 Hypertensive heart and chronic kidney disease with heart failure and stage 1 through stage 4 chronic kidney disease, or unspecified chronic kidney disease: Secondary | ICD-10-CM | POA: Diagnosis not present

## 2022-06-01 DIAGNOSIS — T83511A Infection and inflammatory reaction due to indwelling urethral catheter, initial encounter: Secondary | ICD-10-CM | POA: Diagnosis not present

## 2022-06-01 DIAGNOSIS — F419 Anxiety disorder, unspecified: Secondary | ICD-10-CM | POA: Diagnosis not present

## 2022-06-01 DIAGNOSIS — I5032 Chronic diastolic (congestive) heart failure: Secondary | ICD-10-CM | POA: Diagnosis not present

## 2022-06-02 ENCOUNTER — Other Ambulatory Visit: Payer: Self-pay | Admitting: Orthopedic Surgery

## 2022-06-02 ENCOUNTER — Telehealth: Payer: Self-pay | Admitting: *Deleted

## 2022-06-02 ENCOUNTER — Encounter: Payer: Self-pay | Admitting: Nurse Practitioner

## 2022-06-02 ENCOUNTER — Telehealth (INDEPENDENT_AMBULATORY_CARE_PROVIDER_SITE_OTHER): Payer: Medicare PPO | Admitting: Nurse Practitioner

## 2022-06-02 DIAGNOSIS — B028 Zoster with other complications: Secondary | ICD-10-CM

## 2022-06-02 MED ORDER — VALACYCLOVIR HCL 1 G PO TABS: 1000.0000 mg | ORAL_TABLET | Freq: Three times a day (TID) | ORAL | 0 refills | Status: DC

## 2022-06-02 NOTE — Progress Notes (Signed)
Careteam: Patient Care Team: Carmen Heir, NP as PCP - General (Adult Health Nurse Practitioner) Lewayne Bunting, MD as PCP - Cardiology (Cardiology) Antony Madura, MD as Consulting Physician (Neurology)  Advanced Directive information Does Patient Have a Medical Advance Directive?: Yes, Type of Advance Directive: Healthcare Power of [place];Living will, Does patient want to make changes to medical advance directive?: No - Patient declined  Allergies  Allergen Reactions   Ace Inhibitors Cough   Latex Rash    Chief Complaint  Patient presents with   Acute Visit    Possible Shingles. Hydrocortisone Cream is not working and some of the sores are opening and has spread towards eye     HPI: Patient is a 89 y.o. female for area on her head.  She has hx of stiff person syndrome, she is wheel chair bound and is bed ridden.  Has to use a lift to get out of bed.  She has had bumps on her head over left eye, noticed 6 days ago.  Area are sore, starting to have then on the eyes. More lesions around eyes, continue to appear. Burns, hurts and itches.  Reports pustules in eye area She is not aware of any visual changes over the last few days.  She denies any pain in her eye  Using saline eye drops which has helped eye.  Review of Systems:  Review of Systems  Eyes:  Negative for blurred vision.  Skin:  Positive for itching and rash.    Past Medical History:  Diagnosis Date   ACE-inhibitor cough    Breast CA (HCC) 2010   s/p lumpectomy & radiation   CAD (coronary artery disease) 07/1998   s/p cath in 2000. 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    Thyroid disease     Vertebral fracture, pathological    Past Surgical History:  Procedure Laterality Date   BREAST BIOPSY Left    BREAST LUMPECTOMY Right March 2010   BREAST LUMPECTOMY WITH RADIOACTIVE SEED LOCALIZATION Left 09/20/2014   Procedure: LEFT BREAST LUMPECTOMY WITH RADIOACTIVE SEED LOCALIZATION;  Surgeon: [name], MD;  Location: [facility];  Service: General;  Laterality: Left;   CARPAL TUNNEL RELEASE Right    CESAREAN SECTION     EYE SURGERY     Right Eye   Skin Cancer Removed from Right Forearm     TOTAL ABDOMINAL HYSTERECTOMY     Social History:   reports that she quit smoking about 59 years ago. Her smoking use included cigarettes. She has never used smokeless tobacco. She reports that she does not drink alcohol and does not use drugs.  Family History  Problem Relation Age of Onset   Coronary artery disease Mother    Hypertension Mother    Melanoma Mother    Cancer Mother 47       breast   Breast cancer Mother    Cancer Father 16       prostate   Cancer Sister 20       breast   Breast cancer Sister    Cancer Maternal Aunt 88  breast   Cancer Sister 34       breast   Breast cancer Sister    Breast cancer Daughter 31    Medications: Patient's Medications  New Prescriptions   No medications on file  Previous Medications   ACETAMINOPHEN (TYLENOL) 500 MG TABLET    Take 1,000 mg by mouth in the morning and at bedtime.   APIXABAN (ELIQUIS) 5 MG TABS TABLET    Take 1 tablet (5 mg total) by mouth 2 (two) times daily.   BACLOFEN 5 MG TABS    Take 1 tablet by mouth 2 (two) times daily.   BREXPIPRAZOLE (REXULTI) 0.25 MG TABS TABLET    Take 1 tablet (0.25 mg total) by mouth daily.   CALCIUM-PHOSPHORUS-VITAMIN D (CALCIUM/D3 ADULT GUMMIES PO)    Take 500 mg by mouth in the morning.   DOCUSATE SODIUM (COLACE) 100 MG CAPSULE    Take 1 capsule (100 mg total) by mouth 2 (two) times daily.   DULOXETINE 40 MG CPEP    Take 1 capsule (40 mg total) by mouth at bedtime.    FUROSEMIDE (LASIX) 20 MG TABLET    Take 20 mg by mouth daily.   LEVOTHYROXINE (SYNTHROID) 150 MCG TABLET    Take 1 tablet (150 mcg total) by mouth daily before breakfast.   METOPROLOL TARTRATE (LOPRESSOR) 25 MG TABLET    Take 0.5 tablets (12.5 mg total) by mouth 2 (two) times daily.   POLYETHYLENE GLYCOL (MIRALAX / GLYCOLAX) 17 G PACKET    Take 17 g by mouth daily as needed for mild constipation.   PROBIOTIC PRODUCT (PROBIOTIC GUMMIES PO)    Take 2 tablets by mouth in the morning.  Modified Medications   No medications on file  Discontinued Medications   BACLOFEN 5 MG TABS    Take 1 tablet (5 mg total) by mouth 3 (three) times daily as needed.   NYSTATIN-TRIAMCINOLONE OINTMENT (MYCOLOG)    Apply 1 Application topically 2 (two) times daily.    Physical Exam:  There were no vitals filed for this visit. There is no height or weight on file to calculate BMI. Wt Readings from Last 3 Encounters:  05/20/22 200 lb (90.7 kg)  05/10/22 200 lb 9.9 oz (91 kg)  04/26/22 199 lb 15.3 oz (90.7 kg)    Physical Exam Constitutional:      Appearance: Normal appearance.  Pulmonary:     Effort: Pulmonary effort is normal.  Skin:    Findings: Erythema, lesion and rash present.  Neurological:     Mental Status: She is alert. Mental status is at baseline.  Psychiatric:        Mood and Affect: Mood normal.     Labs reviewed: Basic Metabolic Panel: Recent Labs    11/27/21 0730 11/30/21 0315 02/13/22 0505 02/14/22 0602 03/14/22 2220 03/16/22 1015 03/17/22 0843 04/18/22 1122 04/27/22 0454 04/30/22 0505 05/10/22 1241  NA  --    < > 128* 129*   < > 137 134*   < > 133* 136 132*  K  --    < > 4.4 4.2   < > 4.0 4.2   < > 3.5 3.8 4.2  CL  --    < > 92* 92*   < > 98 98   < > 98 100 93*  CO2  --    < > 29 31   < > 30 30   < > 27 28 28   GLUCOSE  --    < >  152* 130*   < > 112* 110*   < > 103* 111* 102*  BUN  --    < > 18 22   < > 11 17   < > 7* 8 11  CREATININE  --    < > 0.70 0.70   < > 0.72  0.71   < > 0.49 0.48 0.58  CALCIUM  --    < > 8.6* 8.6*   < > 8.7* 8.4*   < > 8.1* 8.5* 8.4*  MG  --    < > 2.2 2.2  --  2.1 2.2  --   --  2.0  --   PHOS  --    < > 3.9 3.4  --  4.5  --   --   --   --   --   TSH 0.434  --   --   --   --   --   --   --   --   --   --    < > = values in this interval not displayed.   Liver Function Tests: Recent Labs    03/14/22 2220 03/16/22 1015 04/18/22 1122 04/26/22 0756  AST 18  --  16 13*  ALT 14  --  11 8  ALKPHOS 81  --  75 68  BILITOT 0.5  --  0.7 0.6  PROT 6.6  --  7.2 6.7  ALBUMIN 3.4* 3.2* 3.8 3.3*   Recent Labs    04/26/22 0756  LIPASE 27   No results for input(s): "AMMONIA" in the last 8760 hours. CBC: Recent Labs    03/17/22 0843 04/18/22 1122 04/26/22 0756 04/27/22 0454 04/30/22 0505 05/10/22 1241 05/20/22 0933  WBC 4.5 9.9 8.7   < > 5.6 5.7 5.7  NEUTROABS 2.6 8.2* 6.9  --   --   --   --   HGB 11.2* 14.1 12.9   < > 11.3* 11.5* 13.1  HCT 34.9* 43.1 40.6   < > 35.2* 35.5* 39.3  MCV 94.3 93.1 94.0   < > 94.1 93.2 91.8  PLT 251 346 319   < > 276 269 284   < > = values in this interval not displayed.   Lipid Panel: No results for input(s): "CHOL", "HDL", "LDLCALC", "TRIG", "CHOLHDL", "LDLDIRECT" in the last 8760 hours. TSH: Recent Labs    11/27/21 0730  TSH 0.434   A1C: No results found for: "HGBA1C"   Assessment/Plan 1. Herpes zoster with other complication Rash consistent with zoster, very close to eye, not having any visual changes or pain in eye at this time.  -recommended to follow up with ophthalmology stat at the risk of losing vision due to complications related to zoster.  To start valtrex TID at this time.  - valACYclovir (VALTREX) 1000 MG tablet; Take 1 tablet (1,000 mg total) by mouth 3 (three) times daily.  Dispense: 21 tablet; Refill: 0  -strict follow up precautions discussed. To follow up if pain occurs, worsening or more lesions while on medications.  Janene Harvey. Biagio Borg  [facility] [phone]    Virtual Visit via Earleen Reaper  I connected with patient on 06/02/22 at 11:00 AM EDT by video and verified that I am speaking with the correct person using two identifiers.  Location: Patient: home Provider: [place]    I discussed the limitations, risks, security and privacy concerns of performing an evaluation and management service by telephone and the  availability of in person appointments. I also discussed with the patient that there may be a patient responsible charge related to this service. The patient expressed understanding and agreed to proceed.   I discussed the assessment and treatment plan with the patient. The patient was provided an opportunity to ask questions and all were answered. The patient agreed with the plan and demonstrated an understanding of the instructions.   The patient was advised to call back or seek an in-person evaluation if the symptoms worsen or if the condition fails to improve as anticipated.  I provided 20 minutes of non-face-to-face time during this encounter.  Janene Harvey. Biagio Borg Avs printed and mailed

## 2022-06-02 NOTE — Telephone Encounter (Signed)
[name], Carmen Haney are scheduled for a virtual visit with your provider today.    Just as we do with appointments in the office, we must obtain your consent to participate.  Your consent will be active for this visit and any virtual visit you Carmen Haney have with one of our providers in the next 365 days.    If you have a MyChart account, I can also send a copy of this consent to you electronically.  All virtual visits are billed to your insurance company just like a traditional visit in the office.  As this is a virtual visit, video technology does not allow for your provider to perform a traditional examination.  This Carmen Haney limit your provider's ability to fully assess your condition.  If your provider identifies any concerns that need to be evaluated in person or the need to arrange testing such as labs, EKG, etc, we will make arrangements to do so.    Although advances in technology are sophisticated, we cannot ensure that it will always work on either your end or our end.  If the connection with a video visit is poor, we Carmen Haney have to switch to a telephone visit.  With either a video or telephone visit, we are not always able to ensure that we have a secure connection.   I need to obtain your verbal consent now.   Are you willing to proceed with your visit today?   Carmen Haney has provided verbal consent on 06/02/2022 for a virtual visit (video or telephone).   Carmen Haney, [place] 06/02/2022  10:45 AM

## 2022-06-02 NOTE — Progress Notes (Signed)
  This service is provided via telemedicine  No vital signs collected/recorded due to the encounter was a telemedicine visit.   Location of patient (ex: home, work):  Home  Patient consents to a telephone visit:  Yes  Location of the provider (ex: office, home):  Office [place].   Name of any referring provider:  na  Names of all persons participating in the telemedicine service and their role in the encounter:  Sue Lush, Patient, [name], Daughter, Synetta Fail Alyxis Grippi, CMA, Abbey Chatters, NP  Time spent on call:  7:16

## 2022-06-04 DIAGNOSIS — I5032 Chronic diastolic (congestive) heart failure: Secondary | ICD-10-CM | POA: Diagnosis not present

## 2022-06-04 DIAGNOSIS — T83511A Infection and inflammatory reaction due to indwelling urethral catheter, initial encounter: Secondary | ICD-10-CM | POA: Diagnosis not present

## 2022-06-04 DIAGNOSIS — B0239 Other herpes zoster eye disease: Secondary | ICD-10-CM | POA: Diagnosis not present

## 2022-06-04 DIAGNOSIS — N183 Chronic kidney disease, stage 3 unspecified: Secondary | ICD-10-CM | POA: Diagnosis not present

## 2022-06-04 DIAGNOSIS — J439 Emphysema, unspecified: Secondary | ICD-10-CM | POA: Diagnosis not present

## 2022-06-04 DIAGNOSIS — F331 Major depressive disorder, recurrent, moderate: Secondary | ICD-10-CM | POA: Diagnosis not present

## 2022-06-04 DIAGNOSIS — F419 Anxiety disorder, unspecified: Secondary | ICD-10-CM | POA: Diagnosis not present

## 2022-06-04 DIAGNOSIS — F411 Generalized anxiety disorder: Secondary | ICD-10-CM | POA: Diagnosis not present

## 2022-06-04 DIAGNOSIS — I130 Hypertensive heart and chronic kidney disease with heart failure and stage 1 through stage 4 chronic kidney disease, or unspecified chronic kidney disease: Secondary | ICD-10-CM | POA: Diagnosis not present

## 2022-06-04 DIAGNOSIS — N390 Urinary tract infection, site not specified: Secondary | ICD-10-CM | POA: Diagnosis not present

## 2022-06-04 DIAGNOSIS — F322 Major depressive disorder, single episode, severe without psychotic features: Secondary | ICD-10-CM | POA: Diagnosis not present

## 2022-06-04 DIAGNOSIS — I2510 Atherosclerotic heart disease of native coronary artery without angina pectoris: Secondary | ICD-10-CM | POA: Diagnosis not present

## 2022-06-05 ENCOUNTER — Ambulatory Visit: Payer: Medicare PPO | Admitting: Neurology

## 2022-06-05 DIAGNOSIS — F322 Major depressive disorder, single episode, severe without psychotic features: Secondary | ICD-10-CM | POA: Diagnosis not present

## 2022-06-05 DIAGNOSIS — I2510 Atherosclerotic heart disease of native coronary artery without angina pectoris: Secondary | ICD-10-CM | POA: Diagnosis not present

## 2022-06-05 DIAGNOSIS — J439 Emphysema, unspecified: Secondary | ICD-10-CM | POA: Diagnosis not present

## 2022-06-05 DIAGNOSIS — N390 Urinary tract infection, site not specified: Secondary | ICD-10-CM | POA: Diagnosis not present

## 2022-06-05 DIAGNOSIS — I130 Hypertensive heart and chronic kidney disease with heart failure and stage 1 through stage 4 chronic kidney disease, or unspecified chronic kidney disease: Secondary | ICD-10-CM | POA: Diagnosis not present

## 2022-06-05 DIAGNOSIS — T83511A Infection and inflammatory reaction due to indwelling urethral catheter, initial encounter: Secondary | ICD-10-CM | POA: Diagnosis not present

## 2022-06-05 DIAGNOSIS — F419 Anxiety disorder, unspecified: Secondary | ICD-10-CM | POA: Diagnosis not present

## 2022-06-05 DIAGNOSIS — I5032 Chronic diastolic (congestive) heart failure: Secondary | ICD-10-CM | POA: Diagnosis not present

## 2022-06-05 DIAGNOSIS — N183 Chronic kidney disease, stage 3 unspecified: Secondary | ICD-10-CM | POA: Diagnosis not present

## 2022-06-10 DIAGNOSIS — I5032 Chronic diastolic (congestive) heart failure: Secondary | ICD-10-CM | POA: Diagnosis not present

## 2022-06-10 DIAGNOSIS — J439 Emphysema, unspecified: Secondary | ICD-10-CM | POA: Diagnosis not present

## 2022-06-10 DIAGNOSIS — I2510 Atherosclerotic heart disease of native coronary artery without angina pectoris: Secondary | ICD-10-CM | POA: Diagnosis not present

## 2022-06-10 DIAGNOSIS — N183 Chronic kidney disease, stage 3 unspecified: Secondary | ICD-10-CM | POA: Diagnosis not present

## 2022-06-10 DIAGNOSIS — F322 Major depressive disorder, single episode, severe without psychotic features: Secondary | ICD-10-CM | POA: Diagnosis not present

## 2022-06-10 DIAGNOSIS — I130 Hypertensive heart and chronic kidney disease with heart failure and stage 1 through stage 4 chronic kidney disease, or unspecified chronic kidney disease: Secondary | ICD-10-CM | POA: Diagnosis not present

## 2022-06-10 DIAGNOSIS — T83511A Infection and inflammatory reaction due to indwelling urethral catheter, initial encounter: Secondary | ICD-10-CM | POA: Diagnosis not present

## 2022-06-10 DIAGNOSIS — N390 Urinary tract infection, site not specified: Secondary | ICD-10-CM | POA: Diagnosis not present

## 2022-06-10 DIAGNOSIS — F419 Anxiety disorder, unspecified: Secondary | ICD-10-CM | POA: Diagnosis not present

## 2022-06-11 ENCOUNTER — Other Ambulatory Visit: Payer: Self-pay

## 2022-06-11 DIAGNOSIS — F331 Major depressive disorder, recurrent, moderate: Secondary | ICD-10-CM | POA: Diagnosis not present

## 2022-06-11 DIAGNOSIS — I480 Paroxysmal atrial fibrillation: Secondary | ICD-10-CM

## 2022-06-11 DIAGNOSIS — F411 Generalized anxiety disorder: Secondary | ICD-10-CM | POA: Diagnosis not present

## 2022-06-11 MED ORDER — METOPROLOL TARTRATE 25 MG PO TABS: 12.5000 mg | ORAL_TABLET | Freq: Two times a day (BID) | ORAL | 2 refills | Status: DC

## 2022-06-11 MED ORDER — APIXABAN 5 MG PO TABS
5.0000 mg | ORAL_TABLET | Freq: Two times a day (BID) | ORAL | 3 refills | Status: DC
Start: 2022-06-11 — End: 2023-10-05

## 2022-06-11 NOTE — Telephone Encounter (Signed)
Prescription refill request for Eliquis received. Indication: Afib  Last office visit: 01/30/22 ([facility])  Scr: 0.58 (05/10/22)  Age: 89 Weight: 90.7kg  Appropriate dose. Refill sent.

## 2022-06-12 DIAGNOSIS — R29898 Other symptoms and signs involving the musculoskeletal system: Secondary | ICD-10-CM | POA: Diagnosis not present

## 2022-06-12 DIAGNOSIS — G2582 Stiff-man syndrome: Secondary | ICD-10-CM | POA: Diagnosis not present

## 2022-06-12 DIAGNOSIS — R760 Raised antibody titer: Secondary | ICD-10-CM | POA: Diagnosis not present

## 2022-06-12 DIAGNOSIS — I4891 Unspecified atrial fibrillation: Secondary | ICD-10-CM | POA: Diagnosis not present

## 2022-06-15 DIAGNOSIS — R760 Raised antibody titer: Secondary | ICD-10-CM | POA: Diagnosis not present

## 2022-06-15 DIAGNOSIS — I4891 Unspecified atrial fibrillation: Secondary | ICD-10-CM | POA: Diagnosis not present

## 2022-06-15 DIAGNOSIS — G2582 Stiff-man syndrome: Secondary | ICD-10-CM | POA: Diagnosis not present

## 2022-06-15 DIAGNOSIS — R29898 Other symptoms and signs involving the musculoskeletal system: Secondary | ICD-10-CM | POA: Diagnosis not present

## 2022-06-17 DIAGNOSIS — F331 Major depressive disorder, recurrent, moderate: Secondary | ICD-10-CM | POA: Diagnosis not present

## 2022-06-17 DIAGNOSIS — F411 Generalized anxiety disorder: Secondary | ICD-10-CM | POA: Diagnosis not present

## 2022-06-18 ENCOUNTER — Telehealth: Payer: Self-pay

## 2022-06-18 DIAGNOSIS — N390 Urinary tract infection, site not specified: Secondary | ICD-10-CM | POA: Diagnosis not present

## 2022-06-18 DIAGNOSIS — I130 Hypertensive heart and chronic kidney disease with heart failure and stage 1 through stage 4 chronic kidney disease, or unspecified chronic kidney disease: Secondary | ICD-10-CM | POA: Diagnosis not present

## 2022-06-18 DIAGNOSIS — F419 Anxiety disorder, unspecified: Secondary | ICD-10-CM | POA: Diagnosis not present

## 2022-06-18 DIAGNOSIS — N183 Chronic kidney disease, stage 3 unspecified: Secondary | ICD-10-CM | POA: Diagnosis not present

## 2022-06-18 DIAGNOSIS — I5032 Chronic diastolic (congestive) heart failure: Secondary | ICD-10-CM | POA: Diagnosis not present

## 2022-06-18 DIAGNOSIS — T83511A Infection and inflammatory reaction due to indwelling urethral catheter, initial encounter: Secondary | ICD-10-CM | POA: Diagnosis not present

## 2022-06-18 DIAGNOSIS — J439 Emphysema, unspecified: Secondary | ICD-10-CM | POA: Diagnosis not present

## 2022-06-18 DIAGNOSIS — I2510 Atherosclerotic heart disease of native coronary artery without angina pectoris: Secondary | ICD-10-CM | POA: Diagnosis not present

## 2022-06-18 DIAGNOSIS — F322 Major depressive disorder, single episode, severe without psychotic features: Secondary | ICD-10-CM | POA: Diagnosis not present

## 2022-06-18 NOTE — Telephone Encounter (Addendum)
Elmo Putt, nurse, with [facility] called stating that patient's foley cath will not be changed today. It will be done when she sees the urologist. This was just an FYI call.  Message sent to Hazle Nordmann, NP

## 2022-06-18 NOTE — Telephone Encounter (Signed)
Thank you  for update

## 2022-06-19 DIAGNOSIS — F419 Anxiety disorder, unspecified: Secondary | ICD-10-CM | POA: Diagnosis not present

## 2022-06-19 DIAGNOSIS — I130 Hypertensive heart and chronic kidney disease with heart failure and stage 1 through stage 4 chronic kidney disease, or unspecified chronic kidney disease: Secondary | ICD-10-CM | POA: Diagnosis not present

## 2022-06-19 DIAGNOSIS — N390 Urinary tract infection, site not specified: Secondary | ICD-10-CM | POA: Diagnosis not present

## 2022-06-19 DIAGNOSIS — I5032 Chronic diastolic (congestive) heart failure: Secondary | ICD-10-CM | POA: Diagnosis not present

## 2022-06-19 DIAGNOSIS — J439 Emphysema, unspecified: Secondary | ICD-10-CM | POA: Diagnosis not present

## 2022-06-19 DIAGNOSIS — I2510 Atherosclerotic heart disease of native coronary artery without angina pectoris: Secondary | ICD-10-CM | POA: Diagnosis not present

## 2022-06-19 DIAGNOSIS — F322 Major depressive disorder, single episode, severe without psychotic features: Secondary | ICD-10-CM | POA: Diagnosis not present

## 2022-06-19 DIAGNOSIS — T83511A Infection and inflammatory reaction due to indwelling urethral catheter, initial encounter: Secondary | ICD-10-CM | POA: Diagnosis not present

## 2022-06-19 DIAGNOSIS — N183 Chronic kidney disease, stage 3 unspecified: Secondary | ICD-10-CM | POA: Diagnosis not present

## 2022-06-22 DIAGNOSIS — I130 Hypertensive heart and chronic kidney disease with heart failure and stage 1 through stage 4 chronic kidney disease, or unspecified chronic kidney disease: Secondary | ICD-10-CM | POA: Diagnosis not present

## 2022-06-22 DIAGNOSIS — F322 Major depressive disorder, single episode, severe without psychotic features: Secondary | ICD-10-CM | POA: Diagnosis not present

## 2022-06-22 DIAGNOSIS — N390 Urinary tract infection, site not specified: Secondary | ICD-10-CM | POA: Diagnosis not present

## 2022-06-22 DIAGNOSIS — J439 Emphysema, unspecified: Secondary | ICD-10-CM | POA: Diagnosis not present

## 2022-06-22 DIAGNOSIS — T83511A Infection and inflammatory reaction due to indwelling urethral catheter, initial encounter: Secondary | ICD-10-CM | POA: Diagnosis not present

## 2022-06-22 DIAGNOSIS — F419 Anxiety disorder, unspecified: Secondary | ICD-10-CM | POA: Diagnosis not present

## 2022-06-22 DIAGNOSIS — I5032 Chronic diastolic (congestive) heart failure: Secondary | ICD-10-CM | POA: Diagnosis not present

## 2022-06-22 DIAGNOSIS — N183 Chronic kidney disease, stage 3 unspecified: Secondary | ICD-10-CM | POA: Diagnosis not present

## 2022-06-22 DIAGNOSIS — I2510 Atherosclerotic heart disease of native coronary artery without angina pectoris: Secondary | ICD-10-CM | POA: Diagnosis not present

## 2022-06-23 ENCOUNTER — Other Ambulatory Visit: Payer: Self-pay | Admitting: Student

## 2022-06-23 DIAGNOSIS — Z01812 Encounter for preprocedural laboratory examination: Secondary | ICD-10-CM

## 2022-06-23 DIAGNOSIS — G2582 Stiff-man syndrome: Secondary | ICD-10-CM

## 2022-06-24 ENCOUNTER — Ambulatory Visit (HOSPITAL_COMMUNITY)
Admission: RE | Admit: 2022-06-24 | Discharge: 2022-06-24 | Disposition: A | Discharge status: Home / Self Care | Payer: Medicare PPO | Source: Ambulatory Visit | Attending: Physician Assistant | Admitting: Physician Assistant

## 2022-06-24 ENCOUNTER — Encounter (HOSPITAL_COMMUNITY): Payer: Self-pay

## 2022-06-24 ENCOUNTER — Other Ambulatory Visit (HOSPITAL_COMMUNITY): Payer: Self-pay | Admitting: Interventional Radiology

## 2022-06-24 ENCOUNTER — Other Ambulatory Visit: Payer: Self-pay

## 2022-06-24 DIAGNOSIS — Z853 Personal history of malignant neoplasm of breast: Secondary | ICD-10-CM | POA: Diagnosis not present

## 2022-06-24 DIAGNOSIS — Z87891 Personal history of nicotine dependence: Secondary | ICD-10-CM | POA: Diagnosis not present

## 2022-06-24 DIAGNOSIS — Z01812 Encounter for preprocedural laboratory examination: Secondary | ICD-10-CM

## 2022-06-24 DIAGNOSIS — Z4689 Encounter for fitting and adjustment of other specified devices: Secondary | ICD-10-CM | POA: Diagnosis not present

## 2022-06-24 DIAGNOSIS — R339 Retention of urine, unspecified: Secondary | ICD-10-CM | POA: Diagnosis not present

## 2022-06-24 DIAGNOSIS — I4819 Other persistent atrial fibrillation: Secondary | ICD-10-CM | POA: Insufficient documentation

## 2022-06-24 DIAGNOSIS — Z7901 Long term (current) use of anticoagulants: Secondary | ICD-10-CM | POA: Insufficient documentation

## 2022-06-24 DIAGNOSIS — Z435 Encounter for attention to cystostomy: Secondary | ICD-10-CM | POA: Diagnosis not present

## 2022-06-24 DIAGNOSIS — G2582 Stiff-man syndrome: Secondary | ICD-10-CM | POA: Diagnosis not present

## 2022-06-24 DIAGNOSIS — F419 Anxiety disorder, unspecified: Secondary | ICD-10-CM | POA: Insufficient documentation

## 2022-06-24 DIAGNOSIS — R760 Raised antibody titer: Secondary | ICD-10-CM

## 2022-06-24 HISTORY — PX: IR CATHETER TUBE CHANGE: IMG717

## 2022-06-24 LAB — CBC
HCT: 43.3 % (ref 36.0–46.0)
Hemoglobin: 14.5 g/dL (ref 12.0–15.0)
MCH: 30.9 pg (ref 26.0–34.0)
MCHC: 33.5 g/dL (ref 30.0–36.0)
MCV: 92.1 fL (ref 80.0–100.0)
Platelets: 388 10*3/uL (ref 150–400)
RBC: 4.7 MIL/uL (ref 3.87–5.11)
RDW: 15.8 % — ABNORMAL HIGH (ref 11.5–15.5)
WBC: 5.5 10*3/uL (ref 4.0–10.5)
nRBC: 0 % (ref 0.0–0.2)

## 2022-06-24 MED ORDER — FENTANYL CITRATE (PF) 100 MCG/2ML IJ SOLN
INTRAMUSCULAR | Status: AC | PRN
  Administered 2022-06-24 (×2): 25 ug via INTRAVENOUS

## 2022-06-24 MED ORDER — MIDAZOLAM HCL 2 MG/2ML IJ SOLN
INTRAMUSCULAR | Status: AC
  Filled 2022-06-24: qty 2

## 2022-06-24 MED ORDER — LIDOCAINE HCL 1 % IJ SOLN: 20.0000 mL | Freq: Once | INTRAMUSCULAR | Status: DC

## 2022-06-24 MED ORDER — IOHEXOL 300 MG/ML  SOLN
50.0000 mL | Freq: Once | INTRAMUSCULAR | Status: AC | PRN
  Administered 2022-06-24: 10 mL

## 2022-06-24 MED ORDER — LIDOCAINE HCL 1 % IJ SOLN
INTRAMUSCULAR | Status: AC
  Filled 2022-06-24: qty 20

## 2022-06-24 MED ORDER — SODIUM CHLORIDE 0.9 % IV SOLN: INTRAVENOUS | Status: DC

## 2022-06-24 MED ORDER — SODIUM CHLORIDE 0.9% FLUSH: 5.0000 mL | Freq: Three times a day (TID) | INTRAVENOUS | Status: DC

## 2022-06-24 MED ORDER — MIDAZOLAM HCL 2 MG/2ML IJ SOLN
INTRAMUSCULAR | Status: AC | PRN
  Administered 2022-06-24: 1 mg via INTRAVENOUS

## 2022-06-24 MED ORDER — FENTANYL CITRATE (PF) 100 MCG/2ML IJ SOLN
INTRAMUSCULAR | Status: AC
  Filled 2022-06-24: qty 2

## 2022-06-24 NOTE — H&P (Signed)
Chief Complaint: Patient was seen in consultation today for supra pubic catheter upsize at the request of Pace,[name]   Supervising Physician: Roanna Banning  Patient Status: [facility] - Out-pt  History of Present Illness: Carmen Haney is a 89 y.o. female   FULL Code Status per pt  Hx Breast Ca Anxiety disorder Stiff person syndrome--- spasmodic/bedbound Afib-- Eliquis:  LD 06/22/22 Chronic foley catheter Urinary retention  Known to IR 14 Fr SP catheter was placed 05/20/22 Scheduled today for upsize  Doing well with this catheter at home [facility] helping there   Past Medical History:  Diagnosis Date   ACE-inhibitor cough    Breast CA (HCC) 2010   s/p lumpectomy & radiation   CAD (coronary artery disease) 07/1998   s/p cath in 2000. 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    Thyroid disease    Vertebral fracture, pathological     Past Surgical History:  Procedure Laterality Date   BREAST BIOPSY Left    BREAST LUMPECTOMY Right March 2010   BREAST LUMPECTOMY WITH RADIOACTIVE SEED LOCALIZATION Left 09/20/2014   Procedure: LEFT BREAST LUMPECTOMY WITH RADIOACTIVE SEED LOCALIZATION;  Surgeon: [name], MD;  Location: [facility];  Service: General;  Laterality: Left;   CARPAL TUNNEL RELEASE Right    CESAREAN SECTION     EYE SURGERY     Right Eye   Skin Cancer Removed from Right Forearm     TOTAL ABDOMINAL HYSTERECTOMY      Allergies: Ace inhibitors and Latex  Medications: Prior to Admission medications   Medication Sig Start Date End Date Taking? Authorizing Provider  Baclofen 5 MG TABS Take 1 tablet by mouth 2 (two) times daily.   Yes  [provider]  brexpiprazole (REXULTI) 0.25 MG TABS tablet Take 1 tablet (0.25 mg total) by mouth daily. 02/05/22  Yes Fargo, Amy E, NP  Calcium-Phosphorus-Vitamin D (CALCIUM/D3 ADULT GUMMIES PO) Take 500 mg by mouth in the morning.   Yes [provider]  docusate sodium (COLACE) 100 MG capsule Take 1 capsule (100 mg total) by mouth 2 (two) times daily. 12/01/21  Yes [name], MD  DULoxetine 40 MG CPEP Take 1 capsule (40 mg total) by mouth at bedtime. 03/19/22  Yes Danford, Earl Lites, MD  furosemide (LASIX) 20 MG tablet Take 20 mg by mouth daily.   Yes [provider]  levothyroxine (SYNTHROID) 150 MCG tablet Take 1 tablet (150 mcg total) by mouth daily before breakfast. 03/13/22  Yes Fargo, Amy E, NP  metoprolol tartrate (LOPRESSOR) 25 MG tablet Take 0.5 tablets (12.5 mg total) by mouth 2 (two) times daily. 06/11/22  Yes Lewayne Bunting, MD  Probiotic Product (PROBIOTIC GUMMIES PO) Take 2 tablets by mouth in the morning.   Yes [provider]  acetaminophen (TYLENOL) 500 MG tablet Take 1,000 mg by mouth in the morning and at bedtime.    [provider]  apixaban (ELIQUIS) 5 MG TABS tablet Take 1 tablet (5 mg total) by mouth 2 (two) times daily. 06/11/22   Lewayne Bunting, MD  polyethylene glycol (MIRALAX / Ethelene Hal)  17 g packet Take 17 g by mouth daily as needed for mild constipation.    [provider]  valACYclovir (VALTREX) 1000 MG tablet Take 1 tablet (1,000 mg total) by mouth 3 (three) times daily. 06/02/22   Sharon Seller, NP  olmesartan (BENICAR) 20 MG tablet Take 20 mg by mouth daily.    04/25/10  [provider]     Family History  Problem Relation Age of Onset   Coronary artery disease Mother    Hypertension Mother    Melanoma Mother    Cancer Mother 13       breast   Breast cancer Mother    Cancer Father 42       prostate   Cancer Sister 45       breast   Breast cancer Sister    Cancer Maternal Aunt  41       breast   Cancer Sister 39       breast   Breast cancer Sister    Breast cancer Daughter 58    Social History   Socioeconomic History   Marital status: Widowed    Spouse name: [name]   Number of children: Not on file   Years of education: Not on file   Highest education level: Not on file  Occupational History   Occupation: Retired from [employer]    Employer: RETIRED  Tobacco Use   Smoking status: Former    Types: Cigarettes    Quit date: 01/13/1963    Years since quitting: 59.4   Smokeless tobacco: Never   Tobacco comments:    stopped in 1970's  Vaping Use   Vaping Use: Never used  Substance and Sexual Activity   Alcohol use: No    Alcohol/week: 0.0 standard drinks of alcohol   Drug use: Never   Sexual activity: Not on file  Other Topics Concern   Not on file  Social History Narrative   Are you right handed or left handed? right   Are you currently employed ?    What is your current occupation?   Do you live at home alone?   Who lives with you? Daughter and husband   What type of home do you live in: 1 story or 2 story? two    Caffeine 2 cups a day   Social Determinants of Health   Financial Resource Strain: Low Risk  (02/23/2022)   Overall Financial Resource Strain (CARDIA)    Difficulty of Paying Living Expenses: Not hard at all  Food Insecurity: No Food Insecurity (03/15/2022)   Hunger Vital Sign    Worried About Running Out of Food in the Last Year: Never true    Ran Out of Food in the Last Year: Never true  Transportation Needs: No Transportation Needs (03/15/2022)   PRAPARE - Administrator, Civil Service (Medical): No    Lack of Transportation (Non-Medical): No  Physical Activity: Inactive (02/23/2022)   Exercise Vital Sign    Days of Exercise per Week: 0 days    Minutes of Exercise per Session: 0 min  Stress: Stress Concern Present (02/23/2022)   [employer] of Occupational Health - Occupational Stress Questionnaire    Feeling of  Stress : Rather much  Social Connections: Socially Isolated (02/23/2022)   Social Connection and Isolation Panel [NHANES]    Frequency of Communication with Friends and Family: Three times a week    Frequency of Social Gatherings with Friends and Family: Twice a week  Attends Religious Services: Never    Active Member of Clubs or Organizations: No    Attends Banker Meetings: Never    Marital Status: Widowed     Review of Systems: A 12 point ROS discussed and pertinent positives are indicated in the HPI above.  All other systems are negative.  Review of Systems  Constitutional:  Negative for activity change and fever.  Respiratory:  Negative for cough and shortness of breath.   Neurological:  Positive for weakness.  Psychiatric/Behavioral:  Negative for behavioral problems and confusion.     Vital Signs: BP (!) 153/91   Pulse 66   Temp 98.3 F (36.8 C) (Oral)   Resp 14   Ht 5\' 3"  (1.6 m)   Wt 199 lb (90.3 kg)   SpO2 93%   BMI 35.25 kg/m   Advance Care Plan: The advanced care plan/surrogate decision maker was discussed at the time of visit and documented in the medical record.    Physical Exam Vitals reviewed.  HENT:     Mouth/Throat:     Mouth: Mucous membranes are moist.  Cardiovascular:     Rate and Rhythm: Normal rate and regular rhythm.     Heart sounds: Normal heart sounds.  Pulmonary:     Effort: Pulmonary effort is normal.     Breath sounds: Normal breath sounds.  Abdominal:     Palpations: Abdomen is soft.  Musculoskeletal:     Comments: Bedbound   Skin:    General: Skin is warm.  Neurological:     Mental Status: She is alert and oriented to person, place, and time.  Psychiatric:        Behavior: Behavior normal.     Imaging: No results found.  Labs:  CBC: Recent Labs    04/30/22 0505 05/10/22 1241 05/20/22 0933 06/24/22 1028  WBC 5.6 5.7 5.7 5.5  HGB 11.3* 11.5* 13.1 14.5  HCT 35.2* 35.5* 39.3 43.3  PLT 276 269 284 388     COAGS: Recent Labs    11/27/21 0730 04/18/22 1122 05/20/22 0933  INR 1.2 1.2 1.1  APTT  --  33  --     BMP: Recent Labs    04/26/22 0756 04/27/22 0454 04/30/22 0505 05/10/22 1241  NA 132* 133* 136 132*  K 3.5 3.5 3.8 4.2  CL 93* 98 100 93*  CO2 29 27 28 28   GLUCOSE 110* 103* 111* 102*  BUN 9 7* 8 11  CALCIUM 8.7* 8.1* 8.5* 8.4*  CREATININE 0.61 0.49 0.48 0.58  GFRNONAA >60 >60 >60 >60    LIVER FUNCTION TESTS: Recent Labs    02/12/22 0601 02/13/22 0505 03/14/22 2220 03/16/22 1015 04/18/22 1122 04/26/22 0756  BILITOT 0.6  --  0.5  --  0.7 0.6  AST 17  --  18  --  16 13*  ALT 12  --  14  --  11 8  ALKPHOS 60  --  81  --  75 68  PROT 6.6  --  6.6  --  7.2 6.7  ALBUMIN 3.2*   < > 3.4* 3.2* 3.8 3.3*   < > = values in this interval not displayed.    TUMOR MARKERS: No results for input(s): "AFPTM", "CEA", "CA199", "CHROMGRNA" in the last 8760 hours.  Assessment and Plan:  Scheduled for supra pubic catheter upsize Pt and family aware of procedure benefits and risks including but not limited to Infection; bleeding; organ damage Agreeable to proceed Consent signed  Thank  you for this interesting consult.  I greatly enjoyed meeting Carmen Haney and look forward to participating in their care.  A copy of this report was sent to the requesting provider on this date.  Electronically Signed: Robet Leu, PA-C 06/24/2022, 11:26 AM   I spent a total of    25 Minutes in face to face in clinical consultation, greater than 50% of which was counseling/coordinating care for SP catheter

## 2022-06-24 NOTE — Procedures (Signed)
Vascular and Interventional Radiology Procedure Note  Patient: Carmen Haney DOB: [birth year] Medical Record Number: [id number] Note Date/Time: 06/24/22 12:47 PM   Performing Physician: Roanna Banning, MD Assistant(s): None  Diagnosis: Routine exchange. and upsize to Council catheter  Procedure: SUPRAPUBIC CYSTOSTOMY TUBE UPSIZE and EXCHANGE  Anesthesia: Conscious Sedation Complications: None Estimated Blood Loss: Minimal  Findings:  Successful exchange and upsize to a 46F Council suprapubic cystostomy tube under fluoroscopy.  Plan: Pt to follow up with Urology for routine SP catheter exchanges.   See detailed procedure note with images in PACS. The patient tolerated the procedure well without incident or complication and was returned to Recovery in stable condition.    Roanna Banning, MD [facility]   Pager. [phone] Clinic. [phone]

## 2022-06-25 ENCOUNTER — Telehealth: Payer: Self-pay

## 2022-06-25 DIAGNOSIS — F411 Generalized anxiety disorder: Secondary | ICD-10-CM | POA: Diagnosis not present

## 2022-06-25 DIAGNOSIS — F331 Major depressive disorder, recurrent, moderate: Secondary | ICD-10-CM | POA: Diagnosis not present

## 2022-06-25 NOTE — Telephone Encounter (Signed)
Annabelle Harman, RN case manager with [facility], called requesting verbal orders for speech therapy consult due to swallowing difficulties and multiple calls from patient's daughter.  Verbal orders were given.

## 2022-06-26 ENCOUNTER — Encounter: Payer: Self-pay | Admitting: Neurology

## 2022-06-26 DIAGNOSIS — I5032 Chronic diastolic (congestive) heart failure: Secondary | ICD-10-CM | POA: Diagnosis not present

## 2022-06-26 DIAGNOSIS — I130 Hypertensive heart and chronic kidney disease with heart failure and stage 1 through stage 4 chronic kidney disease, or unspecified chronic kidney disease: Secondary | ICD-10-CM | POA: Diagnosis not present

## 2022-06-26 DIAGNOSIS — N390 Urinary tract infection, site not specified: Secondary | ICD-10-CM | POA: Diagnosis not present

## 2022-06-26 DIAGNOSIS — N183 Chronic kidney disease, stage 3 unspecified: Secondary | ICD-10-CM | POA: Diagnosis not present

## 2022-06-26 DIAGNOSIS — J439 Emphysema, unspecified: Secondary | ICD-10-CM | POA: Diagnosis not present

## 2022-06-26 DIAGNOSIS — F322 Major depressive disorder, single episode, severe without psychotic features: Secondary | ICD-10-CM | POA: Diagnosis not present

## 2022-06-26 DIAGNOSIS — F419 Anxiety disorder, unspecified: Secondary | ICD-10-CM | POA: Diagnosis not present

## 2022-06-26 DIAGNOSIS — I2510 Atherosclerotic heart disease of native coronary artery without angina pectoris: Secondary | ICD-10-CM | POA: Diagnosis not present

## 2022-06-26 DIAGNOSIS — T83511A Infection and inflammatory reaction due to indwelling urethral catheter, initial encounter: Secondary | ICD-10-CM | POA: Diagnosis not present

## 2022-06-29 ENCOUNTER — Telehealth: Payer: Medicare PPO

## 2022-06-29 DIAGNOSIS — T83511A Infection and inflammatory reaction due to indwelling urethral catheter, initial encounter: Secondary | ICD-10-CM | POA: Diagnosis not present

## 2022-06-29 DIAGNOSIS — N390 Urinary tract infection, site not specified: Secondary | ICD-10-CM | POA: Diagnosis not present

## 2022-06-29 DIAGNOSIS — F419 Anxiety disorder, unspecified: Secondary | ICD-10-CM | POA: Diagnosis not present

## 2022-06-29 DIAGNOSIS — N183 Chronic kidney disease, stage 3 unspecified: Secondary | ICD-10-CM | POA: Diagnosis not present

## 2022-06-29 DIAGNOSIS — I2510 Atherosclerotic heart disease of native coronary artery without angina pectoris: Secondary | ICD-10-CM | POA: Diagnosis not present

## 2022-06-29 DIAGNOSIS — I5032 Chronic diastolic (congestive) heart failure: Secondary | ICD-10-CM | POA: Diagnosis not present

## 2022-06-29 DIAGNOSIS — J439 Emphysema, unspecified: Secondary | ICD-10-CM | POA: Diagnosis not present

## 2022-06-29 DIAGNOSIS — F322 Major depressive disorder, single episode, severe without psychotic features: Secondary | ICD-10-CM | POA: Diagnosis not present

## 2022-06-29 DIAGNOSIS — I130 Hypertensive heart and chronic kidney disease with heart failure and stage 1 through stage 4 chronic kidney disease, or unspecified chronic kidney disease: Secondary | ICD-10-CM | POA: Diagnosis not present

## 2022-06-29 NOTE — Telephone Encounter (Signed)
Roderick Pee, Speech therapist with [facility] called stating that she saw patient today and Gi consult is recommended to follow up on swallowing difficulties. She doesn't recommend any follow up speech therapy.  Message sent to Hazle Nordmann ,NP

## 2022-06-29 NOTE — Telephone Encounter (Signed)
Please call daughter to schedule visit.

## 2022-06-30 NOTE — Telephone Encounter (Signed)
Spoke with patient's daughter and virtual visit scheduled for 6/24.

## 2022-07-01 ENCOUNTER — Telehealth: Payer: Self-pay

## 2022-07-01 NOTE — Telephone Encounter (Signed)
Incoming call received from [facility] requesting verbal orders for supapubic catheter change: 1 time weekly x 1 week and then once every 4 weeks    Per [facility] standing order, verbal order given. Message will be sent to patient's provider as a FYI.

## 2022-07-01 NOTE — Telephone Encounter (Signed)
Approve of orders

## 2022-07-02 DIAGNOSIS — F411 Generalized anxiety disorder: Secondary | ICD-10-CM | POA: Diagnosis not present

## 2022-07-02 DIAGNOSIS — F331 Major depressive disorder, recurrent, moderate: Secondary | ICD-10-CM | POA: Diagnosis not present

## 2022-07-06 ENCOUNTER — Telehealth: Payer: Medicare PPO | Admitting: Orthopedic Surgery

## 2022-07-09 DIAGNOSIS — F331 Major depressive disorder, recurrent, moderate: Secondary | ICD-10-CM | POA: Diagnosis not present

## 2022-07-09 DIAGNOSIS — F411 Generalized anxiety disorder: Secondary | ICD-10-CM | POA: Diagnosis not present

## 2022-07-12 DIAGNOSIS — G2582 Stiff-man syndrome: Secondary | ICD-10-CM | POA: Diagnosis not present

## 2022-07-12 DIAGNOSIS — R29898 Other symptoms and signs involving the musculoskeletal system: Secondary | ICD-10-CM | POA: Diagnosis not present

## 2022-07-12 DIAGNOSIS — I4891 Unspecified atrial fibrillation: Secondary | ICD-10-CM | POA: Diagnosis not present

## 2022-07-12 DIAGNOSIS — R760 Raised antibody titer: Secondary | ICD-10-CM | POA: Diagnosis not present

## 2022-07-15 DIAGNOSIS — G2582 Stiff-man syndrome: Secondary | ICD-10-CM | POA: Diagnosis not present

## 2022-07-15 DIAGNOSIS — I4891 Unspecified atrial fibrillation: Secondary | ICD-10-CM | POA: Diagnosis not present

## 2022-07-15 DIAGNOSIS — R760 Raised antibody titer: Secondary | ICD-10-CM | POA: Diagnosis not present

## 2022-07-15 DIAGNOSIS — R29898 Other symptoms and signs involving the musculoskeletal system: Secondary | ICD-10-CM | POA: Diagnosis not present

## 2022-07-23 DIAGNOSIS — F331 Major depressive disorder, recurrent, moderate: Secondary | ICD-10-CM | POA: Diagnosis not present

## 2022-07-23 DIAGNOSIS — F411 Generalized anxiety disorder: Secondary | ICD-10-CM | POA: Diagnosis not present

## 2022-10-15 DIAGNOSIS — I4891 Unspecified atrial fibrillation: Secondary | ICD-10-CM | POA: Diagnosis not present

## 2022-10-15 DIAGNOSIS — R29898 Other symptoms and signs involving the musculoskeletal system: Secondary | ICD-10-CM | POA: Diagnosis not present

## 2022-10-15 DIAGNOSIS — R760 Raised antibody titer: Secondary | ICD-10-CM | POA: Diagnosis not present

## 2022-10-15 DIAGNOSIS — G2582 Stiff-man syndrome: Secondary | ICD-10-CM | POA: Diagnosis not present

## 2022-10-22 NOTE — Progress Notes (Deleted)
I saw Carmen Haney in neurology clinic on 11/05/22 in follow up for GAD65 ab positive stiff person syndrome.  HPI: Carmen Haney is a 89 y.o. year old female with a history of HTN, HLD, pAfib (on eliquis), CAD, hypothyroidism, osteoporosis, hearing loss, breast cancer (2010), OA, compression fractures of the spine (T12 and L3), depression, and anxiety who we last saw on 05/06/22.  To briefly review: Patient's symptoms started some time back. She has had difficulty with balance and gait for about 5 years. About 1 year ago she was walking with a walker, but having difficulty getting to the bathroom quickly enough. She started having trouble getting up from bed and chair. Patient had a fall in 11/2021. She was having weakness of lower extremities about 3 weeks prior that seemed new. She got up and reached for the walker, her legs gave way and she fell. She had to go to the hospital. She had a fracture of right fibula, but still hold weight. She went to rehab. She was doing well. Prior to being discharged, she was starting to get weaker. She started having mild spasms. She was also fearful of walking.   Her spasms continued to worse and became very severe. The spasms included the trunk and legs. [name]) called patient's geriatrician on 02/10/22 stating that patient could not get up and having severe spasms on 02/09/22 and 911 was called. She is weak in the legs and cannot walk. Daughter was recommended to take patient to ED. Patient was admitted to [facility] from 02/10/22 to 02/14/22. While hospitalized inflammatory markers were elevated and GAD abs were elevated to 160, raising concern for stiff person syndrome. Patient was started on diazepam 5 mg TID and given prednisone 40 mg for 4 days (for muscle strain). Per DC summary, pain and spasm had improved. She was discharged with home health PT/OT.    She had to have diazepam increased to 5 mg every 6-7 hours (QID). If she only takes it TID, she is in pain  due to breakthrough. When she gets nervous or anxious, the spasms worsen. She also takes Tylenol twice daily as well.   It is so hard to move her body, that it is hard to get up in the morning or get ready for bed. She has difficulty rolling over at night. She is immobile when sleeping.   She has had severe OA and knee replacement was recommended, but patient declined.  Most recent Assessment and Plan (05/06/22): This is Carmen Haney, a 89 y.o. female with GAD ab positive stiff person syndrome. She has significant truncal and leg stiffness, pain, and lower extremity edema. Overall, her SPS appears stable. Appointment was extended as we again discussed family dynamics, prognosis and expectations of disease.   Plan: -Continue valium 5 mg q6h PRN -Continue baclofen 5 mg TID PRN -Recommended patient and family discuss edema with cardiology -Patient to start PT soon, which I agree with -Fall precautions discussed  Since their last visit: ***  Family mentioned concerns of swallowing problems and anxiety about swallowing by MyChart on 06/26/22. I recommended swallow evaluation and potentially psychiatry/psychology. Patient was not interested in this. Family also mentioned looking into hospice.  ROS: Pertinent positive and negative systems reviewed in HPI. ***   MEDICATIONS:  Outpatient Encounter Medications as of 11/05/2022  Medication Sig   acetaminophen (TYLENOL) 500 MG tablet Take 1,000 mg by mouth in the morning and at bedtime.   apixaban (ELIQUIS) 5 MG TABS tablet  Take 1 tablet (5 mg total) by mouth 2 (two) times daily.   Baclofen 5 MG TABS Take 1 tablet by mouth 2 (two) times daily.   brexpiprazole (REXULTI) 0.25 MG TABS tablet Take 1 tablet (0.25 mg total) by mouth daily.   Calcium-Phosphorus-Vitamin D (CALCIUM/D3 ADULT GUMMIES PO) Take 500 mg by mouth in the morning.   docusate sodium (COLACE) 100 MG capsule Take 1 capsule (100 mg total) by mouth 2 (two) times daily.   DULoxetine 40  MG CPEP Take 1 capsule (40 mg total) by mouth at bedtime.   furosemide (LASIX) 20 MG tablet Take 20 mg by mouth daily.   levothyroxine (SYNTHROID) 150 MCG tablet Take 1 tablet (150 mcg total) by mouth daily before breakfast.   metoprolol tartrate (LOPRESSOR) 25 MG tablet Take 0.5 tablets (12.5 mg total) by mouth 2 (two) times daily.   polyethylene glycol (MIRALAX / GLYCOLAX) 17 g packet Take 17 g by mouth daily as needed for mild constipation.   Probiotic Product (PROBIOTIC GUMMIES PO) Take 2 tablets by mouth in the morning.   valACYclovir (VALTREX) 1000 MG tablet Take 1 tablet (1,000 mg total) by mouth 3 (three) times daily.   [DISCONTINUED] olmesartan (BENICAR) 20 MG tablet Take 20 mg by mouth daily.     No facility-administered encounter medications on file as of 11/05/2022.    PAST MEDICAL HISTORY: Past Medical History:  Diagnosis Date   ACE-inhibitor cough    Breast CA (HCC) 2010   s/p lumpectomy & radiation   CAD (coronary artery disease) 07/1998   s/p cath in 2000. 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    Thyroid disease    Vertebral fracture, pathological     PAST SURGICAL HISTORY: Past Surgical History:  Procedure Laterality Date   BREAST BIOPSY Left    BREAST LUMPECTOMY Right March 2010   BREAST LUMPECTOMY WITH RADIOACTIVE SEED LOCALIZATION Left 09/20/2014   Procedure: LEFT BREAST LUMPECTOMY WITH RADIOACTIVE SEED LOCALIZATION;  Surgeon: [name], MD;  Location: [facility];  Service: General;  Laterality: Left;   CARPAL TUNNEL RELEASE Right    CESAREAN SECTION     EYE SURGERY     Right Eye   IR CATHETER TUBE CHANGE  06/24/2022   Skin  Cancer Removed from Right Forearm     TOTAL ABDOMINAL HYSTERECTOMY      ALLERGIES: Allergies  Allergen Reactions   Ace Inhibitors Cough   Latex Rash    FAMILY HISTORY: Family History  Problem Relation Age of Onset   Coronary artery disease Mother    Hypertension Mother    Melanoma Mother    Cancer Mother 58       breast   Breast cancer Mother    Cancer Father 78       prostate   Cancer Sister 63       breast   Breast cancer Sister    Cancer Maternal Aunt 87       breast   Cancer Sister 92       breast   Breast cancer Sister    Breast cancer Daughter 53  SOCIAL HISTORY: Social History   Tobacco Use   Smoking status: Former    Current packs/day: 0.00    Types: Cigarettes    Quit date: 01/13/1963    Years since quitting: 59.8   Smokeless tobacco: Never   Tobacco comments:    stopped in 1970's  Vaping Use   Vaping status: Never Used  Substance Use Topics   Alcohol use: No    Alcohol/week: 0.0 standard drinks of alcohol   Drug use: Never   Social History   Social History Narrative   Are you right handed or left handed? right   Are you currently employed ?    What is your current occupation?   Do you live at home alone?   Who lives with you? Daughter and husband   What type of home do you live in: 1 story or 2 story? two    Caffeine 2 cups a day    Objective:  Vital Signs:  There were no vitals taken for this visit.  General:*** General appearance: Awake and alert. No distress. Cooperative with exam.  Skin: No obvious rash or jaundice. HEENT: Atraumatic. Anicteric. Lungs: Non-labored breathing on room air  Heart: Regular Abdomen: Soft, non tender. Extremities: No edema. No obvious deformity.  Musculoskeletal: No obvious joint swelling.  Neurological: Mental Status: Alert. Speech fluent. No pseudobulbar affect Cranial Nerves: CNII: No RAPD. Visual fields intact. CNIII, IV, VI: PERRL. No nystagmus. EOMI. CN V: Facial sensation intact  bilaterally to fine touch. [place] clench strong. Jaw jerk***. CN VII: Facial muscles symmetric and strong. No ptosis at rest or after sustained upgaze***. CN VIII: Hears finger rub well bilaterally. CN IX: No hypophonia. CN X: Palate elevates symmetrically. CN XI: Full strength shoulder shrug bilaterally. CN XII: Tongue protrusion full and midline. No atrophy or fasciculations. No significant dysarthria*** Motor: Tone is ***. *** fasciculations in *** extremities. *** atrophy. No grip or percussive myotonia.  Individual muscle group testing (MRC grade out of 5):  Movement     Neck flexion ***    Neck extension ***     Right Left   Shoulder abduction *** ***   Shoulder adduction *** ***   Shoulder ext rotation *** ***   Shoulder int rotation *** ***   Elbow flexion *** ***   Elbow extension *** ***   Wrist extension *** ***   Wrist flexion *** ***   Finger abduction - FDI *** ***   Finger abduction - ADM *** ***   Finger extension *** ***   Finger distal flexion - 2/3 *** ***   Finger distal flexion - 4/5 *** ***   Thumb flexion - FPL *** ***   Thumb abduction - APB *** ***    Hip flexion *** ***   Hip extension *** ***   Hip adduction *** ***   Hip abduction *** ***   Knee extension *** ***   Knee flexion *** ***   Dorsiflexion *** ***   Plantarflexion *** ***   Inversion *** ***   Eversion *** ***   Great toe extension *** ***   Great toe flexion *** ***     Reflexes:  Right Left  Bicep *** ***  Tricep *** ***  BrRad *** ***  Knee *** ***  Ankle *** ***   Pathological Reflexes: Babinski: *** response bilaterally*** Hoffman: *** Troemner: *** Pectoral: *** Palmomental: *** Facial: *** Midline tap: *** Sensation: Pinprick: *** Vibration: *** Temperature: *** Proprioception: *** Coordination: Intact finger-to- nose-finger  and heel-to-shin bilaterally. Romberg negative.*** Gait: Able to rise from chair with arms crossed unassisted. Normal,  narrow-based gait. Able to tandem walk. Able to walk on toes and heels.***   Lab and Test Review: New results: CBC (06/24/22): unremarkable INR (05/20/22): 1.1 BMP (05/10/22): significant for Na 132 (chronic), Ca 8.4 (chronic)  Previously reviewed results: 03/17/22: CBC unremarkable BMP: unremarkable Mg: wnl  11/2021: Normal or unremarkable: CBC, BMP, ESR, vit D, TSH CMP significant for albumin of 3.1   02/11/22: CRP: 16.3 ESR: 58 GAD ab: 160.3 CK: 132 CMP: hyponatremia (Na 128 - chronic), albumin 2.9 CBC unremarkable   CT head and cervical spine wo contrast (04/12/22): FINDINGS: CT HEAD FINDINGS   Brain: No evidence of acute infarction, hemorrhage, hydrocephalus, extra-axial collection or mass lesion/mass effect. Chronic small vessel ischemia in the cerebral white matter that is extensive.   Vascular: No hyperdense vessel or unexpected calcification.   Skull: Normal. Negative for fracture or focal lesion.   Sinuses/Orbits: No evidence of injury. Chronic left sphenoid sinusitis with partial opacification and sclerosis.   CT CERVICAL SPINE FINDINGS   Alignment: Normal.   Skull base and vertebrae: No acute fracture. No primary bone lesion or focal pathologic process.   Soft tissues and spinal canal: No prevertebral fluid or swelling. No visible canal hematoma.   Disc levels: Ordinary, generalized degenerative changes.   Upper chest: No evidence of injury   IMPRESSION: No evidence of acute intracranial or cervical spine injury.   CT head wo contrast (02/10/22): FINDINGS: Brain: No intracranial hemorrhage, mass effect, or midline shift. Brain volume is normal for age. No hydrocephalus. The basilar cisterns are patent. Minimal basal gangliar mineralization is likely senescent. Mild periventricular and deep chronic small vessel ischemic change. No evidence of territorial infarct or acute ischemia. No extra-axial or intracranial fluid collection.   Vascular: No  hyperdense vessel or unexpected calcification.   Skull: No fracture or focal lesion.   Sinuses/Orbits: Chronic sphenoid sinusitis with cortical thickening and fluid. No acute findings. No mastoid effusion. Bilateral cataract resection.   Other: None.   IMPRESSION: 1. No acute intracranial abnormality. 2. Mild chronic small vessel ischemic change. 3. Chronic sphenoid sinusitis.   MRI lumbar spine wo contrast (02/11/22): FINDINGS: Segmentation:  Standard.   Alignment: Hyperlordosis with mild L2-3 retrolisthesis and L5-S1, L4-5 anterolisthesis   Vertebrae: Remote compression fractures at T12 and L3. Minimal superior endplate edema at L3 is likely reactive. Height loss is greater at T12 and measures up to 50% anteriorly when compared to L1. No acute or subacute fracture. No aggressive bone lesion.   Conus medullaris and cauda equina: Conus extends to the L1-2 level. Conus and cauda equina appear normal.   Paraspinal and other soft tissues: Negative for perispinal mass or inflammation. Expansion and T2 hyperintensity in the lower right iliopsoas   Disc levels:   T12- L1: Disc space narrowing and ventral spurring. No neural impingement   L1-L2: Disc space narrowing and bulging. Negative facets. No neural impingement   L2-L3: Disc narrowing and bulging. Bilateral inferior foraminal protrusion. Negative facets. Mild spinal stenosis   L3-L4: Disc space narrowing with inferior foraminal bulging. Mild facet spurring. Mild spinal stenosis   L4-L5: Degenerative facet spurring. Circumferential disc bulging. Mild spinal stenosis   L5-S1:Degenerative facet spurring with mild anterolisthesis. Mild circumferential disc bulging. No neural compression.   IMPRESSION: 1. Strain and/or hemorrhage partially covered at the right iliopsoas, consider CT of the pelvis. 2. No acute lumbar fracture. Remote compression fractures at T12 and  L3. 3. Generalized lumbar spine degeneration as  described.   CT abdomen/pelvis w/ contrast (02/11/22): FINDINGS: Lower chest: No acute abnormality.   Hepatobiliary: No cholelithiasis or biliary dilatation is noted. Multiple hepatic cysts are noted.   Pancreas: Unremarkable. No pancreatic ductal dilatation or surrounding inflammatory changes.   Spleen: Mild splenomegaly.   Adrenals/Urinary Tract: Adrenal glands appear normal. Nonobstructive right renal calculus is noted. Bilateral renal cysts are noted. No hydronephrosis or renal obstruction is noted. Urinary bladder is unremarkable.   Stomach/Bowel: Stomach is within normal limits. Appendix appears normal. No evidence of bowel wall thickening, distention, or inflammatory changes.   Vascular/Lymphatic: No significant vascular findings are present. No enlarged abdominal or pelvic lymph nodes.   Reproductive: Status post hysterectomy. No adnexal masses.   Other: No abdominal wall hernia or abnormality. No abdominopelvic ascites.   Musculoskeletal: Old T12 and L3 compression fractures are noted. No acute osseous abnormality is noted. The right iliopsoas muscle is diffusely enlarged with ill-defined margins suggesting inflammation, edema or injury. No definite evidence of hemorrhage is noted.   IMPRESSION: Right iliopsoas muscle is diffusely enlarged with ill-defined margins suggesting inflammation or edema or possibly injury. No definite hematoma is noted.   Nonobstructive right renal calculus. No hydronephrosis or renal obstruction.   CT lumbar spine wo contrast (11/26/21): FINDINGS: Segmentation: There are five lumbar type vertebral bodies. The last full intervertebral disc space is labeled L5-S1.   Alignment: Normal alignment of the lumbar vertebral bodies. The facets are also normally aligned.   Vertebrae: Diffuse and fairly marked osteoporosis. Mild superior endplate depression of L3 is stable since the prior radiographs. No acute lumbar spine compression  fracture. There is a compression deformity of T12. I do not see this for certain on the prior lumbar spine plain films. This is likely an acute compression fracture. No retropulsion or canal compromise.   Paraspinal and other soft tissues: No significant paraspinal findings. I do not see an obvious paraspinal hematoma at T12.   Disc levels:   T12-L1: No significant findings.   L1-2: Diffuse annular bulge and mild facet disease contributing to mild bilateral lateral recess stenosis.   L2-3: Moderate facet disease and bulging annulus with mild spinal and bilateral lateral recess stenosis.   L3-4: Diffuse bulging annulus, facet disease and ligamentum flavum thickening contributing to mild to moderate spinal and bilateral lateral recess stenosis.   L4-5: Diffuse bulging degenerated annulus, facet disease and ligamentum flavum thickening contributing to moderately severe spinal and bilateral lateral recess stenosis. No significant foraminal stenosis.   L5-S1: Bulging degenerated annulus and severe facet disease with mild spinal and bilateral lateral recess stenosis. No foraminal stenosis.   IMPRESSION: 1. Acute compression fracture of T12 without retropulsion or canal compromise. 2. Diffuse and fairly marked osteoporosis. 3. Multilevel spinal and lateral recess stenosis as discussed above.   CT head and cervical spine wo contrast (11/26/21): FINDINGS: CT HEAD FINDINGS   Brain: No evidence of acute infarction, hemorrhage, hydrocephalus, extra-axial collection or mass lesion/mass effect.   Vascular: Calcific atherosclerosis.   Skull: No acute fracture.   Sinuses/Orbits: Clear sinuses.  No acute orbital findings.   Other: No mastoid effusions.   CT CERVICAL SPINE FINDINGS   Alignment: No substantial sagittal subluxation.   Skull base and vertebrae: No evidence of acute fracture.   Soft tissues and spinal canal: No prevertebral fluid or swelling. No visible canal  hematoma.   Disc levels:  Moderate multilevel degenerative change.   Upper chest: Visualized lung apices are clear.  IMPRESSION: 1. No evidence of acute intracranial abnormality. 2. No evidence of acute fracture or traumatic malalignment in the cervical spine.  ASSESSMENT: This is AHMONI EDGE, a 89 y.o. female with GAD ab positive stiff person syndrome. She has significant truncal and leg stiffness, pain, and lower extremity edema. Overall, her SPS appears stable. Appointment was extended as we again discussed family dynamics, prognosis and expectations of disease. ***  Plan: -Continue valium 5 mg q6h PRN -Continue baclofen 5 mg TID PRN -PT?*** -Fall precautions discussed  Return to clinic in ***  Total time spent reviewing records, interview, history/exam, documentation, and coordination of care on day of encounter:  *** min  Jacquelyne Balint, MD

## 2022-10-26 ENCOUNTER — Other Ambulatory Visit: Payer: Self-pay | Admitting: Orthopedic Surgery

## 2022-11-05 ENCOUNTER — Ambulatory Visit: Payer: Medicare PPO | Admitting: Neurology

## 2023-03-13 DEATH — deceased
